# Patient Record
Sex: Female | Born: 1955
Health system: Southern US, Community
[De-identification: ages and names within clinical notes are randomized; demographics above are authoritative.]

## PROBLEM LIST (undated history)

## (undated) DIAGNOSIS — M869 Osteomyelitis, unspecified: Secondary | ICD-10-CM

## (undated) DIAGNOSIS — R011 Cardiac murmur, unspecified: Secondary | ICD-10-CM

## (undated) DIAGNOSIS — I2510 Atherosclerotic heart disease of native coronary artery without angina pectoris: Secondary | ICD-10-CM

## (undated) DIAGNOSIS — I10 Essential (primary) hypertension: Secondary | ICD-10-CM

## (undated) DIAGNOSIS — L97529 Non-pressure chronic ulcer of other part of left foot with unspecified severity: Secondary | ICD-10-CM

## (undated) DIAGNOSIS — N189 Chronic kidney disease, unspecified: Secondary | ICD-10-CM

## (undated) DIAGNOSIS — I639 Cerebral infarction, unspecified: Secondary | ICD-10-CM

## (undated) DIAGNOSIS — R0600 Dyspnea, unspecified: Secondary | ICD-10-CM

## (undated) DIAGNOSIS — K219 Gastro-esophageal reflux disease without esophagitis: Secondary | ICD-10-CM

## (undated) DIAGNOSIS — M1990 Unspecified osteoarthritis, unspecified site: Secondary | ICD-10-CM

## (undated) DIAGNOSIS — I739 Peripheral vascular disease, unspecified: Secondary | ICD-10-CM

## (undated) DIAGNOSIS — K7581 Nonalcoholic steatohepatitis (NASH): Secondary | ICD-10-CM

## (undated) DIAGNOSIS — G4730 Sleep apnea, unspecified: Secondary | ICD-10-CM

## (undated) DIAGNOSIS — K769 Liver disease, unspecified: Secondary | ICD-10-CM

## (undated) DIAGNOSIS — L97519 Non-pressure chronic ulcer of other part of right foot with unspecified severity: Secondary | ICD-10-CM

## (undated) DIAGNOSIS — G4733 Obstructive sleep apnea (adult) (pediatric): Secondary | ICD-10-CM

## (undated) DIAGNOSIS — T7840XA Allergy, unspecified, initial encounter: Secondary | ICD-10-CM

## (undated) DIAGNOSIS — E119 Type 2 diabetes mellitus without complications: Secondary | ICD-10-CM

## (undated) DIAGNOSIS — G8929 Other chronic pain: Secondary | ICD-10-CM

## (undated) HISTORY — DX: Essential (primary) hypertension: I10

## (undated) HISTORY — DX: Allergy, unspecified, initial encounter: T78.40XA

## (undated) HISTORY — PX: TUBAL LIGATION: SHX77

## (undated) HISTORY — PX: CHOLECYSTECTOMY: SHX55

## (undated) HISTORY — DX: Non-pressure chronic ulcer of other part of left foot with unspecified severity: L97.529

## (undated) HISTORY — PX: HERNIA REPAIR: SHX51

## (undated) HISTORY — PX: KNEE SURGERY: SHX244

## (undated) HISTORY — PX: ABDOMINAL HYSTERECTOMY: SHX81

## (undated) HISTORY — DX: Unspecified osteoarthritis, unspecified site: M19.90

## (undated) HISTORY — PX: TONSILLECTOMY: SUR1361

## (undated) HISTORY — PX: SPINE SURGERY: SHX786

## (undated) HISTORY — PX: TOE AMPUTATION: SHX809

## (undated) HISTORY — DX: Nonalcoholic steatohepatitis (NASH): K75.81

## (undated) HISTORY — PX: BACK SURGERY: SHX140

## (undated) HISTORY — DX: Gastro-esophageal reflux disease without esophagitis: K21.9

## (undated) HISTORY — PX: JOINT REPLACEMENT: SHX530

## (undated) HISTORY — DX: Non-pressure chronic ulcer of other part of right foot with unspecified severity: L97.519

---

## 2012-03-21 DIAGNOSIS — Z9989 Dependence on other enabling machines and devices: Secondary | ICD-10-CM

## 2012-03-21 DIAGNOSIS — G4733 Obstructive sleep apnea (adult) (pediatric): Secondary | ICD-10-CM

## 2012-07-27 DIAGNOSIS — M549 Dorsalgia, unspecified: Secondary | ICD-10-CM | POA: Diagnosis present

## 2012-07-27 DIAGNOSIS — G8929 Other chronic pain: Secondary | ICD-10-CM | POA: Diagnosis present

## 2015-06-06 DIAGNOSIS — I10 Essential (primary) hypertension: Secondary | ICD-10-CM | POA: Diagnosis present

## 2015-07-18 DIAGNOSIS — E119 Type 2 diabetes mellitus without complications: Secondary | ICD-10-CM

## 2015-12-17 DIAGNOSIS — N183 Chronic kidney disease, stage 3 unspecified: Secondary | ICD-10-CM | POA: Diagnosis present

## 2016-09-16 DIAGNOSIS — K7581 Nonalcoholic steatohepatitis (NASH): Secondary | ICD-10-CM

## 2016-09-16 HISTORY — DX: Nonalcoholic steatohepatitis (NASH): K75.81

## 2016-09-19 ENCOUNTER — Emergency Department (HOSPITAL_COMMUNITY): Payer: Medicare PPO

## 2016-09-19 ENCOUNTER — Encounter (HOSPITAL_COMMUNITY): Payer: Self-pay | Admitting: *Deleted

## 2016-09-19 ENCOUNTER — Observation Stay (HOSPITAL_COMMUNITY)
Admission: EM | Admit: 2016-09-19 | Discharge: 2016-09-23 | Disposition: A | Discharge status: Home / Self Care | Payer: Medicare PPO | Attending: Internal Medicine | Admitting: Internal Medicine

## 2016-09-19 DIAGNOSIS — I10 Essential (primary) hypertension: Secondary | ICD-10-CM | POA: Diagnosis present

## 2016-09-19 DIAGNOSIS — I129 Hypertensive chronic kidney disease with stage 1 through stage 4 chronic kidney disease, or unspecified chronic kidney disease: Secondary | ICD-10-CM | POA: Diagnosis not present

## 2016-09-19 DIAGNOSIS — Z87891 Personal history of nicotine dependence: Secondary | ICD-10-CM | POA: Diagnosis not present

## 2016-09-19 DIAGNOSIS — E119 Type 2 diabetes mellitus without complications: Secondary | ICD-10-CM

## 2016-09-19 DIAGNOSIS — Z794 Long term (current) use of insulin: Secondary | ICD-10-CM | POA: Diagnosis not present

## 2016-09-19 DIAGNOSIS — Z9989 Dependence on other enabling machines and devices: Secondary | ICD-10-CM

## 2016-09-19 DIAGNOSIS — Z90710 Acquired absence of both cervix and uterus: Secondary | ICD-10-CM | POA: Diagnosis not present

## 2016-09-19 DIAGNOSIS — R160 Hepatomegaly, not elsewhere classified: Secondary | ICD-10-CM | POA: Diagnosis present

## 2016-09-19 DIAGNOSIS — E1122 Type 2 diabetes mellitus with diabetic chronic kidney disease: Secondary | ICD-10-CM | POA: Diagnosis not present

## 2016-09-19 DIAGNOSIS — E871 Hypo-osmolality and hyponatremia: Secondary | ICD-10-CM | POA: Diagnosis not present

## 2016-09-19 DIAGNOSIS — R740 Nonspecific elevation of levels of transaminase and lactic acid dehydrogenase [LDH]: Secondary | ICD-10-CM | POA: Diagnosis present

## 2016-09-19 DIAGNOSIS — E1165 Type 2 diabetes mellitus with hyperglycemia: Secondary | ICD-10-CM | POA: Diagnosis not present

## 2016-09-19 DIAGNOSIS — D631 Anemia in chronic kidney disease: Secondary | ICD-10-CM | POA: Diagnosis not present

## 2016-09-19 DIAGNOSIS — N179 Acute kidney failure, unspecified: Secondary | ICD-10-CM | POA: Diagnosis not present

## 2016-09-19 DIAGNOSIS — Z9049 Acquired absence of other specified parts of digestive tract: Secondary | ICD-10-CM | POA: Diagnosis not present

## 2016-09-19 DIAGNOSIS — N183 Chronic kidney disease, stage 3 unspecified: Secondary | ICD-10-CM | POA: Diagnosis present

## 2016-09-19 DIAGNOSIS — K769 Liver disease, unspecified: Secondary | ICD-10-CM | POA: Insufficient documentation

## 2016-09-19 DIAGNOSIS — G4733 Obstructive sleep apnea (adult) (pediatric): Secondary | ICD-10-CM | POA: Diagnosis not present

## 2016-09-19 DIAGNOSIS — IMO0002 Reserved for concepts with insufficient information to code with codable children: Secondary | ICD-10-CM

## 2016-09-19 DIAGNOSIS — E1142 Type 2 diabetes mellitus with diabetic polyneuropathy: Secondary | ICD-10-CM | POA: Insufficient documentation

## 2016-09-19 DIAGNOSIS — Z79899 Other long term (current) drug therapy: Secondary | ICD-10-CM | POA: Insufficient documentation

## 2016-09-19 DIAGNOSIS — Z8673 Personal history of transient ischemic attack (TIA), and cerebral infarction without residual deficits: Secondary | ICD-10-CM | POA: Diagnosis not present

## 2016-09-19 DIAGNOSIS — R1011 Right upper quadrant pain: Secondary | ICD-10-CM | POA: Diagnosis present

## 2016-09-19 DIAGNOSIS — M549 Dorsalgia, unspecified: Secondary | ICD-10-CM

## 2016-09-19 DIAGNOSIS — R162 Hepatomegaly with splenomegaly, not elsewhere classified: Secondary | ICD-10-CM | POA: Diagnosis not present

## 2016-09-19 DIAGNOSIS — Z881 Allergy status to other antibiotic agents status: Secondary | ICD-10-CM | POA: Diagnosis not present

## 2016-09-19 DIAGNOSIS — E1140 Type 2 diabetes mellitus with diabetic neuropathy, unspecified: Secondary | ICD-10-CM

## 2016-09-19 DIAGNOSIS — G8929 Other chronic pain: Secondary | ICD-10-CM | POA: Diagnosis present

## 2016-09-19 DIAGNOSIS — R7401 Elevation of levels of liver transaminase levels: Secondary | ICD-10-CM

## 2016-09-19 HISTORY — DX: Sleep apnea, unspecified: G47.30

## 2016-09-19 HISTORY — DX: Type 2 diabetes mellitus without complications: E11.9

## 2016-09-19 HISTORY — DX: Obstructive sleep apnea (adult) (pediatric): G47.33

## 2016-09-19 HISTORY — DX: Atherosclerotic heart disease of native coronary artery without angina pectoris: I25.10

## 2016-09-19 HISTORY — DX: Other chronic pain: G89.29

## 2016-09-19 HISTORY — DX: Dyspnea, unspecified: R06.00

## 2016-09-19 HISTORY — DX: Peripheral vascular disease, unspecified: I73.9

## 2016-09-19 HISTORY — DX: Osteomyelitis, unspecified: M86.9

## 2016-09-19 HISTORY — DX: Cardiac murmur, unspecified: R01.1

## 2016-09-19 HISTORY — DX: Cerebral infarction, unspecified: I63.9

## 2016-09-19 HISTORY — DX: Chronic kidney disease, unspecified: N18.9

## 2016-09-19 LAB — I-STAT VENOUS BLOOD GAS, ED
Acid-base deficit: 3 mmol/L — ABNORMAL HIGH (ref 0.0–2.0)
Bicarbonate: 22.8 mmol/L (ref 20.0–28.0)
O2 Saturation: 54 %
PCO2 VEN: 41.6 mmHg — AB (ref 44.0–60.0)
TCO2: 24 mmol/L (ref 0–100)
pH, Ven: 7.347 (ref 7.250–7.430)
pO2, Ven: 30 mmHg — CL (ref 32.0–45.0)

## 2016-09-19 LAB — URINALYSIS, ROUTINE W REFLEX MICROSCOPIC
Bacteria, UA: NONE SEEN
Bilirubin Urine: NEGATIVE
HGB URINE DIPSTICK: NEGATIVE
Ketones, ur: NEGATIVE mg/dL
LEUKOCYTES UA: NEGATIVE
NITRITE: NEGATIVE
PH: 5 (ref 5.0–8.0)
Protein, ur: 30 mg/dL — AB
SPECIFIC GRAVITY, URINE: 1.021 (ref 1.005–1.030)

## 2016-09-19 LAB — COMPREHENSIVE METABOLIC PANEL
ALT: 317 U/L — AB (ref 14–54)
AST: 153 U/L — AB (ref 15–41)
Albumin: 3.2 g/dL — ABNORMAL LOW (ref 3.5–5.0)
Alkaline Phosphatase: 166 U/L — ABNORMAL HIGH (ref 38–126)
Anion gap: 9 (ref 5–15)
BILIRUBIN TOTAL: 0.7 mg/dL (ref 0.3–1.2)
BUN: 30 mg/dL — AB (ref 6–20)
CALCIUM: 8.8 mg/dL — AB (ref 8.9–10.3)
CO2: 22 mmol/L (ref 22–32)
CREATININE: 1.83 mg/dL — AB (ref 0.44–1.00)
Chloride: 98 mmol/L — ABNORMAL LOW (ref 101–111)
GFR, EST AFRICAN AMERICAN: 33 mL/min — AB (ref >60)
GFR, EST NON AFRICAN AMERICAN: 29 mL/min — AB (ref >60)
Glucose, Bld: 571 mg/dL (ref 65–99)
Potassium: 4.3 mmol/L (ref 3.5–5.1)
Sodium: 129 mmol/L — ABNORMAL LOW (ref 135–145)
TOTAL PROTEIN: 6.4 g/dL — AB (ref 6.5–8.1)

## 2016-09-19 LAB — LIPASE, BLOOD: Lipase: 47 U/L (ref 11–51)

## 2016-09-19 LAB — CBC
HCT: 35.9 % — ABNORMAL LOW (ref 36.0–46.0)
Hemoglobin: 11.8 g/dL — ABNORMAL LOW (ref 12.0–15.0)
MCH: 28.5 pg (ref 26.0–34.0)
MCHC: 32.9 g/dL (ref 30.0–36.0)
MCV: 86.7 fL (ref 78.0–100.0)
PLATELETS: 147 10*3/uL — AB (ref 150–400)
RBC: 4.14 MIL/uL (ref 3.87–5.11)
RDW: 13.8 % (ref 11.5–15.5)
WBC: 4.1 10*3/uL (ref 4.0–10.5)

## 2016-09-19 LAB — CBG MONITORING, ED: GLUCOSE-CAPILLARY: 535 mg/dL — AB (ref 65–99)

## 2016-09-19 MED ORDER — SODIUM CHLORIDE 0.9 % IV BOLUS (SEPSIS)
1000.0000 mL | Freq: Once | INTRAVENOUS | Status: AC
  Administered 2016-09-19: 1000 mL via INTRAVENOUS

## 2016-09-19 MED ORDER — INSULIN ASPART 100 UNIT/ML ~~LOC~~ SOLN
10.0000 [IU] | Freq: Once | SUBCUTANEOUS | Status: AC
  Administered 2016-09-19: 10 [IU] via INTRAVENOUS
  Filled 2016-09-19: qty 1

## 2016-09-19 NOTE — ED Notes (Signed)
Patient's family member came up to nurse's station r/e wait and her feeling like her blood sugar is low. Will obtain CBG and reassess. Patient sitting up in wheelchair, NAD noted at this time. Resp e/u, skin is warm and dry. Apologized for wait times and provided blanket. Pt verbalized understanding and is being patient.

## 2016-09-19 NOTE — ED Provider Notes (Signed)
[place] DEPT Provider Note   CSN: [id number] Arrival date & time: 09/19/16  1738     History   Chief Complaint Chief Complaint  Patient presents with  . Abdominal Pain    HPI [name] is a 61 y.o. female who presents with generalized weakness. 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 Her daughter is at bedside. The patient lives in [place] but has been visiting her daughter. Yesterday they went to [facility] because the patient was lightheaded and had several episodes of post-tussive emesis. She denies feeling nauseous or having abdominal pain at that time. She was evaluated and noted to have elevated LFTs but did not want any further evaluation and left AMA. She denies use of Tylenol, alcohol, or any drug use. Her daughter describes that she has had a slow decline over the past several years. She has a nurse aide who comes to her home a couple times a week and she has been requiring more assistance. She states she has been out of her Lantus and has not taken her insulin today because she forgot. She has neuropathy symptoms which are worsening. She reports not having a BM in 3 days which is unusual since she usually has diarrhea. She feels lightheaded and generally unwell. No fever, chest pain, SOB, current nausea, vomiting, diarrhea, dysuria.  HPI  Past Medical History:  Diagnosis Date  . Diabetes mellitus without complication ([place])   . Stroke [facility])     There are no active problems to display for this patient.   History reviewed. No pertinent surgical history.  OB History    No data available       Home Medications    Prior to Admission medications   Not on File    Family History No family history on file.  Social History Social History  Substance Use Topics  . Smoking status: Never Smoker  . Smokeless tobacco: Never Used  . Alcohol use No       Allergies   Doxycycline; Inderal [propranolol]; Lasix [furosemide]; Norvasc [amlodipine besylate]; Prednisone; Sulfa antibiotics; and Vancomycin   Review of Systems Review of Systems  Constitutional: Negative for chills and fever.  Respiratory: Positive for cough. Negative for shortness of breath.   Cardiovascular: Negative for chest pain.  Gastrointestinal: Positive for abdominal pain, constipation and vomiting (resolved). Negative for diarrhea and nausea.  Neurological: Positive for weakness (generalized) and light-headedness. Negative for syncope.  All other systems reviewed and are negative.    Physical Exam Updated Vital Signs BP 128/74 (BP Location: Left Arm)   Pulse 67   Temp 98.7 F (37.1 C) (Oral)   Resp 16   Ht 5' 6"  (1.676 m)   Wt 113.4 kg (250 lb)   SpO2 96%   BMI 40.35 kg/m   Physical Exam  Constitutional: She is oriented to person, place, and time. She appears well-developed and well-nourished. No distress.  Obese, appears older than stated age, calm, cooperative  HENT:  Head: Normocephalic and atraumatic.  Eyes: Pupils are equal, round, and reactive to light. Conjunctivae are normal. Right eye exhibits no discharge. Left eye exhibits no discharge. No scleral icterus.  Neck: Normal range of motion.  Cardiovascular: Normal rate and regular rhythm.  Exam reveals no gallop and no friction rub.   No murmur heard. Pulmonary/Chest: Effort normal and breath sounds normal. No respiratory distress. She has no wheezes. She has no rales.  She exhibits no tenderness.  Abdominal: Soft. Bowel sounds are normal. She exhibits no distension and no mass. There is tenderness (RUQ tenderness). There is no rebound and no guarding. No hernia.  Cholecystectomy scar  Neurological: She is alert and oriented to person, place, and time.  Skin: Skin is warm and dry.  Psychiatric: She has a normal mood and affect. Her behavior is normal.  Nursing note and vitals reviewed.    ED  Treatments / Results  Labs (all labs ordered are listed, but only abnormal results are displayed) Labs Reviewed  COMPREHENSIVE METABOLIC PANEL - Abnormal; Notable for the following:       Result Value   Sodium 129 (*)    Chloride 98 (*)    Glucose, Bld 571 (*)    BUN 30 (*)    Creatinine, Ser 1.83 (*)    Calcium 8.8 (*)    Total Protein 6.4 (*)    Albumin 3.2 (*)    AST 153 (*)    ALT 317 (*)    Alkaline Phosphatase 166 (*)    GFR calc non Af Amer 29 (*)    GFR calc Af Amer 33 (*)    All other components within normal limits  CBC - Abnormal; Notable for the following:    Hemoglobin 11.8 (*)    HCT 35.9 (*)    Platelets 147 (*)    All other components within normal limits  URINALYSIS, ROUTINE W REFLEX MICROSCOPIC - Abnormal; Notable for the following:    Glucose, UA >=500 (*)    Protein, ur 30 (*)    Squamous Epithelial / LPF 0-5 (*)    All other components within normal limits  CBG MONITORING, ED - Abnormal; Notable for the following:    Glucose-Capillary 535 (*)    All other components within normal limits  I-STAT VENOUS BLOOD GAS, ED - Abnormal; Notable for the following:    pCO2, Ven 41.6 (*)    pO2, Ven 30.0 (*)    Acid-base deficit 3.0 (*)    All other components within normal limits  LIPASE, BLOOD  BLOOD GAS, VENOUS  HEPATITIS PANEL, ACUTE  CBG MONITORING, ED    EKG  EKG Interpretation None       Radiology Ct Abdomen Pelvis Wo Contrast  Result Date: 09/20/2016 CLINICAL DATA:  RIGHT upper quadrant pain beginning yesterday. EXAM: CT ABDOMEN AND PELVIS WITHOUT CONTRAST TECHNIQUE: Multidetector CT imaging of the abdomen and pelvis was performed following the standard protocol without IV contrast. GFR 29. COMPARISON:  None. FINDINGS: LOWER CHEST: Lung bases are clear. The visualized heart size is normal. No pericardial effusion. HEPATOBILIARY: Irregular hypodensities in the periphery of the liver, most conspicuous within the LEFT lobe (23 Hounsfield units). Mild  hepatomegaly. No hepatic calcifications. Status post cholecystectomy. PANCREAS: Normal. SPLEEN: Normal. ADRENALS/URINARY TRACT: Kidneys are orthotopic, demonstrating normal size and morphology. No nephrolithiasis, hydronephrosis; limited assessment for renal masses on this nonenhanced examination. The unopacified ureters are normal in course and caliber. Urinary bladder is partially distended and unremarkable. Normal adrenal glands. STOMACH/BOWEL: The stomach, small and large bowel are normal in course and caliber without inflammatory changes, sensitivity decreased by lack of enteric contrast. Normal appendix. VASCULAR/LYMPHATIC: Aortoiliac vessels are normal in course and caliber. Normal mild calcific atherosclerosis. 17 mm round portal caval lymph node. Additional smaller porta hepatis lymph nodes. REPRODUCTIVE: Status post hysterectomy. OTHER: No intraperitoneal free fluid or free air. MUSCULOSKELETAL: Non-acute. Anterior abdominal wall ligamentous laxity. Spinal stimulator battery pack RIGHT gluteal soft  tissues, leading at T8. Anterior abdominal wall scarring. Moderate sacroiliac osteoarthrosis. 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 IMPRESSION: 1. Irregular peripheral hepatic masses. Mild perihepatic lymphadenopathy. Differential diagnosis includes infarcts, infection, atypical hemangiomas and though less likely, metastasis. Given renal failure and, spinal stimulator, recommend ultrasound. 2. Status post cholecystectomy.  Normal appendix. Aortic Atherosclerosis (ICD10-I70.0). Electronically Signed   By: Elon Alas M.D.   On: 09/20/2016 00:43    Procedures Procedures (including critical care time)  Medications Ordered in ED Medications  sodium chloride 0.9 % bolus 1,000 mL (0 mLs Intravenous Stopped 09/20/16 0032)  sodium chloride 0.9 % bolus 1,000 mL (0 mLs Intravenous Stopped 09/20/16 0032)  insulin aspart (novoLOG) injection 10 Units (10 Units  Intravenous Given 09/19/16 2314)  oxyCODONE (Oxy IR/ROXICODONE) immediate release tablet 10 mg (10 mg Oral Given 09/20/16 0107)     Initial Impression / Assessment and Plan / ED Course  I have reviewed the triage vital signs and the nursing notes.  Pertinent labs & imaging results that were available during my care of the patient were reviewed by me and considered in my medical decision making (see chart for details).  61 year old female presents with worsening acute on chronic generalized weakness and RUQ abdominal pain. BP is soft but otherwise vitals are normal. She is chronically ill appearing but pleasant and conversant. She does have RUQ tenderness. CBC remarkable for mild anemia. CMP remarkable for hyponatremia (129), hypochloremia (98), marked hyperglycemia (571), elevated BUN/SCr (which are at baseline), and persistently elevated LFTs. Compared to yesterday in Care Everywhere LFTs are about the same. It appears in March of this year her LFTs are normal. Will order CT scan to further evaluate since she doesn't have a gallbladder. Hepatitis panel sent. It does not appear she's in DKA but is dehydrated so will give fluids and 10 units of insulin.  CT shows multiple irregular hepatic masses with perihepatic lymphadenopathy. Differential diagnosis includes infarcts, infection, atypical hemangiomas and though less likely, metastasis. I spoke with Dr. Collene Mares who recommends MRI of the liver as an inpatient due to chronic illness and poor outpatient f/u. Case signed out to [facility] PA-C who will consult hosptalist   Final Clinical Impressions(s) / ED Diagnoses   Final diagnoses:  RUQ pain  Liver masses  Elevated transaminase level  Uncontrolled type 2 diabetes mellitus with diabetic neuropathy, with long-term current use of insulin [facility])    New Prescriptions New Prescriptions   No medications on file     Iris Pert 09/20/16 1512    Pattricia Boss, MD 09/20/16 2315

## 2016-09-19 NOTE — ED Notes (Signed)
Notified nurse first result from istat venous blood gas.

## 2016-09-19 NOTE — ED Notes (Signed)
MD made aware of patient's venous blood gas. No new orders at this time.

## 2016-09-19 NOTE — ED Triage Notes (Signed)
The pt was seen at [facility] yesterday she was  Diagnosed with a fatty liver.  They wanted to keep her yesterday  She lives in [place].

## 2016-09-19 NOTE — ED Provider Notes (Signed)
Received critical value of glucose >500. Reviewed labs and vitals and no e/o DKA, will add on VBG and urinalysis, but does not seem to be in critical need of a bed at this time based on labs, CC and vitals.    Merrily Pew, MD 09/19/16 [phone]

## 2016-09-20 ENCOUNTER — Observation Stay (HOSPITAL_COMMUNITY): Payer: Medicare PPO

## 2016-09-20 ENCOUNTER — Encounter (HOSPITAL_COMMUNITY): Payer: Self-pay | Admitting: Family Medicine

## 2016-09-20 ENCOUNTER — Emergency Department (HOSPITAL_COMMUNITY): Payer: Medicare PPO

## 2016-09-20 DIAGNOSIS — R160 Hepatomegaly, not elsewhere classified: Secondary | ICD-10-CM | POA: Diagnosis not present

## 2016-09-20 DIAGNOSIS — R162 Hepatomegaly with splenomegaly, not elsewhere classified: Secondary | ICD-10-CM | POA: Diagnosis not present

## 2016-09-20 DIAGNOSIS — G4733 Obstructive sleep apnea (adult) (pediatric): Secondary | ICD-10-CM | POA: Diagnosis not present

## 2016-09-20 DIAGNOSIS — R1011 Right upper quadrant pain: Secondary | ICD-10-CM

## 2016-09-20 DIAGNOSIS — Z9989 Dependence on other enabling machines and devices: Secondary | ICD-10-CM

## 2016-09-20 DIAGNOSIS — N183 Chronic kidney disease, stage 3 (moderate): Secondary | ICD-10-CM | POA: Diagnosis not present

## 2016-09-20 DIAGNOSIS — Z794 Long term (current) use of insulin: Secondary | ICD-10-CM | POA: Diagnosis not present

## 2016-09-20 DIAGNOSIS — E1140 Type 2 diabetes mellitus with diabetic neuropathy, unspecified: Secondary | ICD-10-CM | POA: Diagnosis not present

## 2016-09-20 DIAGNOSIS — I10 Essential (primary) hypertension: Secondary | ICD-10-CM | POA: Diagnosis not present

## 2016-09-20 DIAGNOSIS — M549 Dorsalgia, unspecified: Secondary | ICD-10-CM | POA: Diagnosis not present

## 2016-09-20 DIAGNOSIS — E1165 Type 2 diabetes mellitus with hyperglycemia: Secondary | ICD-10-CM | POA: Diagnosis not present

## 2016-09-20 DIAGNOSIS — R740 Nonspecific elevation of levels of transaminase and lactic acid dehydrogenase [LDH]: Secondary | ICD-10-CM | POA: Diagnosis not present

## 2016-09-20 DIAGNOSIS — G8929 Other chronic pain: Secondary | ICD-10-CM | POA: Diagnosis not present

## 2016-09-20 LAB — CBG MONITORING, ED
GLUCOSE-CAPILLARY: 359 mg/dL — AB (ref 65–99)
GLUCOSE-CAPILLARY: 411 mg/dL — AB (ref 65–99)
GLUCOSE-CAPILLARY: 317 mg/dL — AB (ref 65–99)
GLUCOSE-CAPILLARY: 318 mg/dL — AB (ref 65–99)
Glucose-Capillary: 290 mg/dL — ABNORMAL HIGH (ref 65–99)
Glucose-Capillary: 335 mg/dL — ABNORMAL HIGH (ref 65–99)

## 2016-09-20 LAB — GLUCOSE, CAPILLARY: GLUCOSE-CAPILLARY: 378 mg/dL — AB (ref 65–99)

## 2016-09-20 MED ORDER — PRAVASTATIN SODIUM 20 MG PO TABS
10.0000 mg | ORAL_TABLET | Freq: Every day | ORAL | Status: DC
  Administered 2016-09-20: 10 mg via ORAL
  Filled 2016-09-20 (×2): qty 1

## 2016-09-20 MED ORDER — INSULIN GLARGINE 100 UNIT/ML ~~LOC~~ SOLN
40.0000 [IU] | Freq: Every day | SUBCUTANEOUS | Status: DC
  Administered 2016-09-20 – 2016-09-21 (×2): 40 [IU] via SUBCUTANEOUS
  Filled 2016-09-20 (×2): qty 0.4

## 2016-09-20 MED ORDER — INSULIN ASPART 100 UNIT/ML ~~LOC~~ SOLN
20.0000 [IU] | Freq: Once | SUBCUTANEOUS | Status: DC
  Filled 2016-09-20: qty 1

## 2016-09-20 MED ORDER — OXYCODONE HCL 5 MG PO TABS
10.0000 mg | ORAL_TABLET | Freq: Once | ORAL | Status: AC
  Administered 2016-09-20: 10 mg via ORAL
  Filled 2016-09-20: qty 2

## 2016-09-20 MED ORDER — CARVEDILOL 25 MG PO TABS
25.0000 mg | ORAL_TABLET | Freq: Two times a day (BID) | ORAL | Status: DC
  Administered 2016-09-20 – 2016-09-23 (×7): 25 mg via ORAL
  Filled 2016-09-20 (×5): qty 1
  Filled 2016-09-20: qty 2
  Filled 2016-09-20: qty 1

## 2016-09-20 MED ORDER — ENOXAPARIN SODIUM 40 MG/0.4ML ~~LOC~~ SOLN
40.0000 mg | SUBCUTANEOUS | Status: DC
  Administered 2016-09-20 – 2016-09-23 (×4): 40 mg via SUBCUTANEOUS
  Filled 2016-09-20 (×5): qty 0.4

## 2016-09-20 MED ORDER — HYDROCHLOROTHIAZIDE 25 MG PO TABS
25.0000 mg | ORAL_TABLET | Freq: Every day | ORAL | Status: DC
  Administered 2016-09-20 – 2016-09-23 (×4): 25 mg via ORAL
  Filled 2016-09-20 (×4): qty 1

## 2016-09-20 MED ORDER — SERTRALINE HCL 50 MG PO TABS
50.0000 mg | ORAL_TABLET | Freq: Every day | ORAL | Status: DC
  Filled 2016-09-20: qty 1

## 2016-09-20 MED ORDER — INSULIN GLARGINE 100 UNIT/ML ~~LOC~~ SOLN
40.0000 [IU] | Freq: Every day | SUBCUTANEOUS | Status: DC
  Filled 2016-09-20: qty 0.4

## 2016-09-20 MED ORDER — ONDANSETRON HCL 4 MG/2ML IJ SOLN
4.0000 mg | Freq: Four times a day (QID) | INTRAMUSCULAR | Status: DC | PRN
  Filled 2016-09-20: qty 2

## 2016-09-20 MED ORDER — INSULIN ASPART 100 UNIT/ML ~~LOC~~ SOLN
20.0000 [IU] | Freq: Once | SUBCUTANEOUS | Status: AC
  Administered 2016-09-20: 20 [IU] via SUBCUTANEOUS

## 2016-09-20 MED ORDER — TAPENTADOL HCL 50 MG PO TABS
50.0000 mg | ORAL_TABLET | Freq: Three times a day (TID) | ORAL | Status: DC
  Administered 2016-09-20 – 2016-09-21 (×4): 50 mg via ORAL
  Filled 2016-09-20 (×8): qty 1

## 2016-09-20 MED ORDER — INSULIN ASPART 100 UNIT/ML ~~LOC~~ SOLN
0.0000 [IU] | Freq: Three times a day (TID) | SUBCUTANEOUS | Status: DC
  Administered 2016-09-20: 15 [IU] via SUBCUTANEOUS
  Administered 2016-09-20: 20 [IU] via SUBCUTANEOUS
  Administered 2016-09-20: 15 [IU] via SUBCUTANEOUS
  Administered 2016-09-21 (×2): 20 [IU] via SUBCUTANEOUS
  Administered 2016-09-21: 10 [IU] via SUBCUTANEOUS
  Administered 2016-09-22 (×2): 15 [IU] via SUBCUTANEOUS
  Administered 2016-09-22: 20 [IU] via SUBCUTANEOUS
  Administered 2016-09-23 (×2): 11 [IU] via SUBCUTANEOUS
  Filled 2016-09-20 (×3): qty 1

## 2016-09-20 MED ORDER — ONDANSETRON HCL 4 MG PO TABS
4.0000 mg | ORAL_TABLET | Freq: Four times a day (QID) | ORAL | Status: DC | PRN
  Administered 2016-09-22 – 2016-09-23 (×2): 4 mg via ORAL
  Filled 2016-09-20 (×2): qty 1

## 2016-09-20 MED ORDER — BUSPIRONE HCL 15 MG PO TABS
15.0000 mg | ORAL_TABLET | Freq: Three times a day (TID) | ORAL | Status: DC
  Administered 2016-09-20 – 2016-09-23 (×10): 15 mg via ORAL
  Filled 2016-09-20 (×4): qty 1
  Filled 2016-09-20: qty 2
  Filled 2016-09-20: qty 1
  Filled 2016-09-20: qty 2
  Filled 2016-09-20 (×3): qty 1

## 2016-09-20 MED ORDER — INSULIN ASPART 100 UNIT/ML ~~LOC~~ SOLN: 6.0000 [IU] | Freq: Three times a day (TID) | SUBCUTANEOUS | Status: DC

## 2016-09-20 MED ORDER — INSULIN ASPART 100 UNIT/ML ~~LOC~~ SOLN
0.0000 [IU] | Freq: Every day | SUBCUTANEOUS | Status: DC
Start: 2016-09-20 — End: 2016-09-23
  Administered 2016-09-21: 2 [IU] via SUBCUTANEOUS
  Administered 2016-09-21: 5 [IU] via SUBCUTANEOUS
  Administered 2016-09-22: 4 [IU] via SUBCUTANEOUS

## 2016-09-20 MED ORDER — SERTRALINE HCL 50 MG PO TABS
50.0000 mg | ORAL_TABLET | Freq: Every day | ORAL | Status: DC
  Filled 2016-09-20 (×2): qty 1

## 2016-09-20 MED ORDER — SERTRALINE HCL 50 MG PO TABS
50.0000 mg | ORAL_TABLET | Freq: Every day | ORAL | Status: DC
  Administered 2016-09-20 – 2016-09-22 (×3): 50 mg via ORAL
  Filled 2016-09-20 (×4): qty 1

## 2016-09-20 MED ORDER — GABAPENTIN 300 MG PO CAPS
300.0000 mg | ORAL_CAPSULE | Freq: Three times a day (TID) | ORAL | Status: DC
  Administered 2016-09-20 – 2016-09-23 (×10): 300 mg via ORAL
  Filled 2016-09-20 (×10): qty 1

## 2016-09-20 NOTE — H&P (Signed)
History and Physical  Patient Name: [name]     IWL:[id number]    DOB: [birth year]    DOA: 09/19/2016 PCP: Wyatt Mage, MD  Patient coming from: Daughter's home  Chief Complaint: Vomiting      HPI: Leean Whobrey is a 61 y.o. 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  The patient was in her normal health until a few weeks ago when she started to notice progressive fatigue.  A week or so ago, her daughter invited her down to visit to [facility], and when she got here daughter thought she "wasn't herself".  Then in the last few days, she was vomiting non-bilious, nonbloody emesis and so the daughter took her mother to the hospital in [place], where she had new elevated LFTs but was discharged.  It appears from notes that the patient was emphatic about leaving.  Today, she continued to feel tired and vomited, so her daughter brought her back to the ER here in [place] where the daughter lives.  There has been no fever, chills, night sweats.  There have been no rashes.  There has been NO abdominal pain, except with examination in the ER last night and tonight.  There has been no weight loss.  ED course: -Afebrile, heart rate 69, respirations and pulse ox normal, BP 110/80 -Na 129 corrects to 137 with glucose, K 4.3, Cr 1.83 (baseline 1.5-1.6), WBC 4.1K, Hgb 11.8 -Lipase normal -UA without pyuria or hematuria -CT abdomen showed peripheral hepatic "masses" with differential to include infarcts, infection, less likely metastasis -The case was discussed with GI who recommended MRI and hostpilist admision and TRH were asked to evaluate   Of note, the patient had "an ovarian mass that metastasized all over my lower abdomen" 4 years ago at [facility].  Notes from her [facility] G[facility] Onc Dr.  Claiborne Billings suggest that this was a benign serous cystadenoma only and make no mention of "metastasis" and in particular the op note from 04/12/12 states "There was no evidence of disease beyond the uterus. All visualized peritoneal, bowel and omental surfaces were normal"     ROS: Review of Systems  Constitutional: Positive for malaise/fatigue. Negative for chills, fever and weight loss.  Gastrointestinal: Positive for diarrhea, nausea and vomiting. Negative for abdominal pain, blood in stool, constipation and melena.  Musculoskeletal: Positive for back pain (chronic).  All other systems reviewed and are negative.         Past Medical History:  Diagnosis Date  . Diabetes mellitus without complication ([place])   . Stroke [facility])     History reviewed. No pertinent surgical history.  Social History: Patient lives alone in [place], [place].  The patient walks unassisted.  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  Former smoker.  2 daughters and a son.  Allergies  Allergen Reactions  . Vancomycin Other (See Comments)    Total organ shut down  . Doxycycline Itching  . Inderal [Propranolol] Cough  . Lasix [Furosemide] Other (See Comments)    Dehydration leading to kidney failure  . Norvasc [Amlodipine Besylate] Other (See Comments)    Severe edema  . Prednisone Other (See Comments)    ketoacidosis  . Sulfa Antibiotics Nausea Only    Family history: family history includes Cancer in her mother;  Epilepsy in her sister; Multiple sclerosis in her sister; Stroke in her father.  Prior to Admission medications   Medication Sig Start Date End Date Taking? Authorizing Provider  acetaminophen (TYLENOL) 500 MG tablet Take 1,000 mg by mouth as needed for pain.   Yes [provider]  busPIRone (BUSPAR) 15 MG tablet Take 15 mg by mouth 3 (three) times daily. 08/31/16  Yes [provider]  carvedilol (COREG) 25 MG tablet Take 25 mg by mouth 2  (two) times daily. 08/17/16  Yes [provider]  exenatide (BYETTA 5 MCG PEN) 5 MCG/0.02ML SOPN injection Inject 0.02 mLs into the skin 2 (two) times daily. 08/10/16  Yes [provider]  gabapentin (NEURONTIN) 100 MG capsule Take 300 mg by mouth 3 (three) times daily.   Yes [provider]  hydrochlorothiazide (HYDRODIURIL) 25 MG tablet Take 25 mg by mouth daily.   Yes [provider]  insulin aspart (NOVOLOG) 100 UNIT/ML FlexPen Inject 2-10 Units into the skin See admin instructions. Based on sliding scale. Max 60 units a day. 06/06/15  Yes [provider]  insulin glargine (LANTUS) 100 UNIT/ML injection Inject 60 Units into the skin 2 (two) times daily. 04/08/15  Yes [provider]  lisinopril (PRINIVIL,ZESTRIL) 40 MG tablet Take 40 mg by mouth 2 (two) times daily. 06/09/16  Yes [provider]  lovastatin (MEVACOR) 40 MG tablet Take 40 mg by mouth at bedtime.   Yes [provider]  sertraline (ZOLOFT) 50 MG tablet Take 50 mg by mouth daily. 07/02/16  Yes [provider]  tapentadol (NUCYNTA) 50 MG tablet Take 50 mg by mouth 3 (three) times daily.   Yes [provider]       Physical Exam: BP 123/66   Pulse 72   Temp 98.7 F (37.1 C) (Oral)   Resp 19   Ht 5' 6"  (1.676 m)   Wt 113.4 kg (250 lb)   SpO2 97%   BMI 40.35 kg/m  General appearance: Well-developed, obese adult female, alert and in no acute distress.   Eyes: Anicteric, conjunctiva pink, lids and lashes normal. PERRL.    ENT: No nasal deformity, discharge, epistaxis.  Hearing normal. OP moist without lesions.  Edentulous. Neck: No neck masses.  Trachea midline.  No thyromegaly/tenderness. Lymph: No cervical or supraclavicular lymphadenopathy. Skin: Warm and dry.  No jaundice.  No suspicious rashes or lesions. Cardiac: RRR, nl Z7-Q7, soft systolic murmur.  Capillary refill is brisk.  JVP not visible.  No LE edema.  Radial and DP pulses 2+ and  symmetric. Respiratory: Normal respiratory rate and rhythm.  CTAB without rales or wheezes. Abdomen: Abdomen soft.  Mild RUQ TTP, no guarding. There appears to be ascites, although patient states this is her normal.   MSK: No deformities or effusions.  No cyanosis or clubbing. Neuro: Cranial nerves normal.  Sensation intact to light touch. Speech is fluent.  Muscle strength normal.    Psych: Sensorium intact and responding to questions, attention normal.  Behavior appropriate.  Affect normal.  Judgment and insight appear normal.     Labs on Admission:  I have personally reviewed following labs and imaging studies: CBC:  Recent Labs Lab 09/19/16 1816  WBC 4.1  HGB 11.8*  HCT 35.9*  MCV 86.7  PLT 341*   Basic Metabolic Panel:  Recent Labs Lab 09/19/16 1816  NA 129*  K 4.3  CL 98*  CO2 22  GLUCOSE 571*  BUN 30*  CREATININE 1.83*  CALCIUM 8.8*  GFR: Estimated Creatinine Clearance: 41.2 mL/min (A) (by C-G formula based on SCr of 1.83 mg/dL (H)).  Liver Function Tests:  Recent Labs Lab 09/19/16 1816  AST 153*  ALT 317*  ALKPHOS 166*  BILITOT 0.7  PROT 6.4*  ALBUMIN 3.2*    Recent Labs Lab 09/19/16 1816  LIPASE 47   CBG:  Recent Labs Lab 09/19/16 2015  GLUCAP 535*             Radiological Exams on Admission: Personally reviewed CT report: Ct Abdomen Pelvis Wo Contrast  Result Date: 09/20/2016 CLINICAL DATA:  RIGHT upper quadrant pain beginning yesterday. EXAM: CT ABDOMEN AND PELVIS WITHOUT CONTRAST TECHNIQUE: Multidetector CT imaging of the abdomen and pelvis was performed following the standard protocol without IV contrast. GFR 29. COMPARISON:  None. FINDINGS: LOWER CHEST: Lung bases are clear. The visualized heart size is normal. No pericardial effusion. HEPATOBILIARY: Irregular hypodensities in the periphery of the liver, most conspicuous within the LEFT lobe (23 Hounsfield units). Mild hepatomegaly. No hepatic calcifications. Status post  cholecystectomy. PANCREAS: Normal. SPLEEN: Normal. ADRENALS/URINARY TRACT: Kidneys are orthotopic, demonstrating normal size and morphology. No nephrolithiasis, hydronephrosis; limited assessment for renal masses on this nonenhanced examination. The unopacified ureters are normal in course and caliber. Urinary bladder is partially distended and unremarkable. Normal adrenal glands. STOMACH/BOWEL: The stomach, small and large bowel are normal in course and caliber without inflammatory changes, sensitivity decreased by lack of enteric contrast. Normal appendix. VASCULAR/LYMPHATIC: Aortoiliac vessels are normal in course and caliber. Normal mild calcific atherosclerosis. 17 mm round portal caval lymph node. Additional smaller porta hepatis lymph nodes. REPRODUCTIVE: Status post hysterectomy. OTHER: No intraperitoneal free fluid or free air. MUSCULOSKELETAL: Non-acute. Anterior abdominal wall ligamentous laxity. Spinal stimulator battery pack RIGHT gluteal soft tissues, leading at T8. Anterior abdominal wall scarring. Moderate sacroiliac osteoarthrosis. 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 IMPRESSION: 1. Irregular peripheral hepatic masses. Mild perihepatic lymphadenopathy. Differential diagnosis includes infarcts, infection, atypical hemangiomas and though less likely, metastasis. Given renal failure and, spinal stimulator, recommend ultrasound. 2. Status post cholecystectomy.  Normal appendix. Aortic Atherosclerosis (ICD10-I70.0). Electronically Signed   By: Elon Alas M.D.   On: 09/20/2016 00:43        Assessment/Plan  1. Liver masses and transaminitis:  Differential includes infarcts, infection, atypical hemagiomas, metastasis.  Need to rule out emboli.   -Obtain Abdomen US -Obtain blood cultures -Obtain Echo -Consult GI, appreciate cares -Contact [employer] rep re: MRI-compatibility of stimulator -Follow hepatitis serologies   2. Chronic  pain:  -Continue Nucynta and gabapentin -Continue sertraline, Buspar  3. Diabetes with hyperglycemia:  -Glargine 50 units BID -Mealtime insulin and SSI  -Hold Byetta  4. Hypertension:  Soft BP at arrival. -Hold lisinopril -Continue carvedilol, HCTZ, statin  5. OSA:  -Continue CPAP    DVT prophylaxis: Lovenox  Code Status: FULL  Family Communication: Daughter present  Disposition Plan: Anticipate further work up and disposition per GI Consults called: GI, Dr. Collene Mares Admission status: OBS At the point of initial evaluation, it is my clinical opinion that admission for OBSERVATION is reasonable and necessary because the patient's presenting complaints in the context of their chronic conditions represent sufficient risk of deterioration or significant morbidity to constitute reasonable grounds for close observation in the hospital setting, but that the patient may be medically stable for discharge from the hospital within 24 to 48 hours.    Medical decision making: Patient seen at 2:45 AM on 09/20/2016.  The patient was discussed with  [facility], PA-C.  What exists of the patient's chart was reviewed in depth and summarized above.  Clinical condition: stable.        Edwin Dada [facility] Pager [phone]

## 2016-09-20 NOTE — ED Notes (Signed)
Dr. Wendee Beavers contacted about insulin dose. States to hold six units of mel coverage and only give sliding scale which he recognizes as 20 units aspart for blood sugar 359. States to recheck CBG 90 minutes after admin

## 2016-09-20 NOTE — ED Notes (Signed)
Pt oob to br with assist. Pt slow but good balance. Pt voids andf has medium bowel movement. States pain improved after medication

## 2016-09-20 NOTE — ED Notes (Signed)
Pt sitting at side of bed with elbow on tray. States she feels hot. Oral temp 98.9

## 2016-09-20 NOTE — ED Provider Notes (Signed)
Care assumed from previous provider PA Gekas. Please see note for further details. Case discussed, plan agreed upon. Briefly, the patient here for abdominal pain with CT showing multiple hepatic masses. GI, Dr. Collene Mares was consulted who recommends MRI of the abdomen and medical admission. MRI was ordered. I spoke with hospitalist, Dr. Loleta Books, who will admit.   [name], Jamie Almond, PA-C 09/20/16 Strong, [place], DO 09/20/16 [phone]

## 2016-09-20 NOTE — ED Notes (Signed)
Patient transported to Ultrasound, unable to have MRI due to electrodes in her back.

## 2016-09-20 NOTE — ED Notes (Signed)
Dr. Wendee Beavers contacted re B repeat BP of 74/60. States to give insulin and 500cc ns bolus.

## 2016-09-20 NOTE — Progress Notes (Signed)
Patient seen and evaluated earlier the same by my associate. Please refer to H&P for details regarding assessment and plan.  Gastroenterology consultation plan is for further workup of liver masses.  Will reassess next am.  Gen: pt in nad, alert and awake CV: no cyanosis Pulm: equal chest rise.  Yani Lal, [employer]

## 2016-09-20 NOTE — ED Notes (Signed)
Sent message to pharmacy to verify medications

## 2016-09-20 NOTE — ED Notes (Signed)
Cancelled order for MRI because pt has a stimulator in place and cannot have an MRI because of same.

## 2016-09-20 NOTE — ED Notes (Signed)
Pt placed om regular bed. States this is great improvement

## 2016-09-20 NOTE — ED Notes (Signed)
REport to Nicki Reaper, RN Admitting MD contacted to let him know her pain is not controlled. And she has internal stimulator in spine.

## 2016-09-20 NOTE — ED Notes (Signed)
CBG 335

## 2016-09-20 NOTE — ED Notes (Signed)
Blood sugar 463 glucometer not crossing over

## 2016-09-20 NOTE — ED Notes (Signed)
Pt assisted to bathroom to void. moderate assist required.

## 2016-09-20 NOTE — ED Notes (Signed)
Patient transported back to Ultrasound to get more images.

## 2016-09-20 NOTE — ED Notes (Signed)
Dr. Wendee Beavers contacted with CBG results of 411 [place] to repeatr the insulin. Of 20 units aspart [place].

## 2016-09-21 ENCOUNTER — Encounter (HOSPITAL_COMMUNITY): Payer: Self-pay | Admitting: *Deleted

## 2016-09-21 ENCOUNTER — Observation Stay (HOSPITAL_BASED_OUTPATIENT_CLINIC_OR_DEPARTMENT_OTHER): Payer: Medicare PPO

## 2016-09-21 DIAGNOSIS — R162 Hepatomegaly with splenomegaly, not elsewhere classified: Secondary | ICD-10-CM | POA: Diagnosis not present

## 2016-09-21 DIAGNOSIS — I5030 Unspecified diastolic (congestive) heart failure: Secondary | ICD-10-CM

## 2016-09-21 DIAGNOSIS — R160 Hepatomegaly, not elsewhere classified: Secondary | ICD-10-CM | POA: Diagnosis not present

## 2016-09-21 LAB — BASIC METABOLIC PANEL
Anion gap: 9 (ref 5–15)
BUN: 27 mg/dL — ABNORMAL HIGH (ref 6–20)
CO2: 25 mmol/L (ref 22–32)
Calcium: 9 mg/dL (ref 8.9–10.3)
Chloride: 98 mmol/L — ABNORMAL LOW (ref 101–111)
Creatinine, Ser: 1.7 mg/dL — ABNORMAL HIGH (ref 0.44–1.00)
GFR, EST AFRICAN AMERICAN: 36 mL/min — AB (ref >60)
GFR, EST NON AFRICAN AMERICAN: 31 mL/min — AB (ref >60)
GLUCOSE: 343 mg/dL — AB (ref 65–99)
POTASSIUM: 4.2 mmol/L (ref 3.5–5.1)
Sodium: 132 mmol/L — ABNORMAL LOW (ref 135–145)

## 2016-09-21 LAB — COMPREHENSIVE METABOLIC PANEL
ALT: 283 U/L — AB (ref 14–54)
AST: 145 U/L — AB (ref 15–41)
Albumin: 3.1 g/dL — ABNORMAL LOW (ref 3.5–5.0)
Alkaline Phosphatase: 156 U/L — ABNORMAL HIGH (ref 38–126)
Anion gap: 8 (ref 5–15)
BUN: 28 mg/dL — AB (ref 6–20)
CHLORIDE: 101 mmol/L (ref 101–111)
CO2: 24 mmol/L (ref 22–32)
CREATININE: 1.64 mg/dL — AB (ref 0.44–1.00)
Calcium: 8.6 mg/dL — ABNORMAL LOW (ref 8.9–10.3)
GFR calc Af Amer: 38 mL/min — ABNORMAL LOW (ref >60)
GFR calc non Af Amer: 33 mL/min — ABNORMAL LOW (ref >60)
Glucose, Bld: 388 mg/dL — ABNORMAL HIGH (ref 65–99)
Potassium: 4.2 mmol/L (ref 3.5–5.1)
SODIUM: 133 mmol/L — AB (ref 135–145)
Total Bilirubin: 0.7 mg/dL (ref 0.3–1.2)
Total Protein: 6.5 g/dL (ref 6.5–8.1)

## 2016-09-21 LAB — CBC
HCT: 35.5 % — ABNORMAL LOW (ref 36.0–46.0)
Hemoglobin: 11.5 g/dL — ABNORMAL LOW (ref 12.0–15.0)
MCH: 27.9 pg (ref 26.0–34.0)
MCHC: 32.4 g/dL (ref 30.0–36.0)
MCV: 86.2 fL (ref 78.0–100.0)
Platelets: 148 10*3/uL — ABNORMAL LOW (ref 150–400)
RBC: 4.12 MIL/uL (ref 3.87–5.11)
RDW: 13.8 % (ref 11.5–15.5)
WBC: 3.9 10*3/uL — ABNORMAL LOW (ref 4.0–10.5)

## 2016-09-21 LAB — GLUCOSE, CAPILLARY
GLUCOSE-CAPILLARY: 426 mg/dL — AB (ref 65–99)
GLUCOSE-CAPILLARY: 215 mg/dL — AB (ref 65–99)
Glucose-Capillary: 388 mg/dL — ABNORMAL HIGH (ref 65–99)
Glucose-Capillary: 486 mg/dL — ABNORMAL HIGH (ref 65–99)
Glucose-Capillary: 442 mg/dL — ABNORMAL HIGH (ref 65–99)
Glucose-Capillary: 430 mg/dL — ABNORMAL HIGH (ref 65–99)
Glucose-Capillary: 374 mg/dL — ABNORMAL HIGH (ref 65–99)

## 2016-09-21 LAB — ECHOCARDIOGRAM COMPLETE
Height: 66 in
Weight: 3988.8 oz

## 2016-09-21 LAB — HEMOGLOBIN A1C
Hgb A1c MFr Bld: 12.2 % — ABNORMAL HIGH (ref 4.8–5.6)
MEAN PLASMA GLUCOSE: 303.44 mg/dL

## 2016-09-21 LAB — HIV ANTIBODY (ROUTINE TESTING W REFLEX): HIV SCREEN 4TH GENERATION: NONREACTIVE

## 2016-09-21 MED ORDER — INSULIN GLARGINE 100 UNIT/ML ~~LOC~~ SOLN
20.0000 [IU] | Freq: Once | SUBCUTANEOUS | Status: AC
  Administered 2016-09-21: 20 [IU] via SUBCUTANEOUS
  Filled 2016-09-21: qty 0.2

## 2016-09-21 MED ORDER — INSULIN GLARGINE 100 UNIT/ML ~~LOC~~ SOLN
50.0000 [IU] | Freq: Two times a day (BID) | SUBCUTANEOUS | Status: DC
  Administered 2016-09-21: 50 [IU] via SUBCUTANEOUS
  Filled 2016-09-21 (×2): qty 0.5

## 2016-09-21 MED ORDER — INSULIN ASPART 100 UNIT/ML ~~LOC~~ SOLN
10.0000 [IU] | Freq: Once | SUBCUTANEOUS | Status: AC
  Administered 2016-09-21: 10 [IU] via SUBCUTANEOUS

## 2016-09-21 MED ORDER — INSULIN GLARGINE 100 UNIT/ML ~~LOC~~ SOLN
45.0000 [IU] | Freq: Two times a day (BID) | SUBCUTANEOUS | Status: DC
  Filled 2016-09-21: qty 0.45

## 2016-09-21 MED ORDER — TAPENTADOL HCL 50 MG PO TABS
50.0000 mg | ORAL_TABLET | ORAL | Status: DC
  Administered 2016-09-21 – 2016-09-23 (×7): 50 mg via ORAL
  Filled 2016-09-21 (×7): qty 1

## 2016-09-21 NOTE — Progress Notes (Addendum)
Pt refused CPAP and RT removed it from room. Pt also states she does not wear one at home.

## 2016-09-21 NOTE — Progress Notes (Signed)
  Echocardiogram 2D Echocardiogram has been performed.  [name] 09/21/2016, 10:57 AM

## 2016-09-21 NOTE — Progress Notes (Signed)
Nurse tech informed me that patient's CBG was 486 when she checked it. I also received a phone call from patient's daughter [name] who was upset that Dr. Wendee Beavers told the patient he was going to discharging her today. She was wondering why they were told that GI was going to be consulted but they haven't seen the patient. I went into the patient's room to get consent to talk to her daughter since she was not listed on the contact list. When I went into the patient's room she started to express her concern about being discharged when her CBG was 486. I told her I was going to page Dr. Wendee Beavers to find out how much more insulin I needed to give. The order says to give 20 units for a CBG up to 400 and to notify the MD if greater than 400. I paged him and he called me back he told me to give her 10 units and give the scheduled Lantus 40 units. I expressed concern that 10 units would not be enough because I gave her 20 units of SSI at 0910 when her CBG was 388 and he told me to give her the 10 units SSI and 40 units of Lantus and recheck in 1 hour.

## 2016-09-21 NOTE — Progress Notes (Signed)
PROGRESS NOTE    [name]  EHM:[id number] DOB: [birth year] DOA: 09/19/2016 PCP: Wyatt Mage, MD   Brief Narrative:  61 year old who presented with nausea and emesis and new liver mass.   Assessment & Plan:   Principal Problem:   Liver mass - Ultrasound reporting that masses most likely consistent with hemangiomas and less likely metastases or cancer. Discussed with GI specialist who recommends outpatient monitoring and follow-up with them. Per my discussion with GI specialist they were asked to question not formally consulted initially.  Active Problems:   Chronic back pain - Continue supportive therapy    Essential hypertension - Stable patient is on hydrochlorothiazide, Coreg    Kidney disease, chronic, stage III (GFR 30-59 ml/min)   OSA on CPAP    Uncontrolled type 2 diabetes mellitus with diabetic polyneuropathy, with long-term current use of insulin (HCC) - Blood sugars uncontrolled and I suspect patient is noncompliant with diabetic regimen. Supposedly she is on 60 of long-acting insulin twice a day. We'll place on 50 units of Lantus twice a day. I have had to administer several doses of short-acting insulin trying to decrease blood sugars below 400s blood but patient seems to have resistance. As such increased Lantus dose   DVT prophylaxis: Lovenox Code Status: Full Family Communication: None at bedside. Disposition Plan: Pending improvement in condition   Consultants:   None   Procedures: None   Antimicrobials: None   Subjective: Pt is concerned about her blood sugars. No new complaints otherwise.  Objective: Vitals:   09/20/16 1930 09/20/16 2029 09/20/16 2245 09/21/16 0814  BP: (!) 146/79 127/61  (!) 115/58  Pulse: 77 82 71 67  Resp: 20 20  18   Temp:  99 F (37.2 C)  98.2 F (36.8 C)  TempSrc:  Oral  Oral  SpO2: 96% 96% 97% 98%  Weight:  113.1 kg (249 lb 4.8 oz)    Height:        Intake/Output Summary (Last 24 hours) at 09/21/16  1655 Last data filed at 09/21/16 0923  Gross per 24 hour  Intake             1080 ml  Output             1400 ml  Net             -320 ml   Filed Weights   09/19/16 1812 09/20/16 2029  Weight: 113.4 kg (250 lb) 113.1 kg (249 lb 4.8 oz)    Examination:  General exam: Appears calm and comfortable, in nad. Respiratory system: Clear to auscultation. Respiratory effort normal. Cardiovascular system: S1 & S2 heard, RRR. No JVD, murmurs, rubs, gallops or clicks. No pedal edema. Gastrointestinal system: Difficult exam due to morbid obesity, soft, nontender, no guarding, no rebound tenderness Central nervous system: Alert and oriented. No focal neurological deficits. Extremities: Symmetric 5 x 5 power. Skin: No rashes, lesions or ulcers, on limited exam. Psychiatry: Mood & affect appropriate.     Data Reviewed: I have personally reviewed following labs and imaging studies  CBC:  Recent Labs Lab 09/19/16 1816 09/21/16 0350  WBC 4.1 3.9*  HGB 11.8* 11.5*  HCT 35.9* 35.5*  MCV 86.7 86.2  PLT 147* 366*   Basic Metabolic Panel:  Recent Labs Lab 09/19/16 1816 09/21/16 0350  NA 129* 133*  K 4.3 4.2  CL 98* 101  CO2 22 24  GLUCOSE 571* 388*  BUN 30* 28*  CREATININE 1.83* 1.64*  CALCIUM 8.8* 8.6*  GFR: Estimated Creatinine Clearance: 45.9 mL/min (A) (by C-G formula based on SCr of 1.64 mg/dL (H)). Liver Function Tests:  Recent Labs Lab 09/19/16 1816 09/21/16 0350  AST 153* 145*  ALT 317* 283*  ALKPHOS 166* 156*  BILITOT 0.7 0.7  PROT 6.4* 6.5  ALBUMIN 3.2* 3.1*    Recent Labs Lab 09/19/16 1816  LIPASE 47   No results for input(s): AMMONIA in the last 168 hours. Coagulation Profile: No results for input(s): INR, PROTIME in the last 168 hours. Cardiac Enzymes: No results for input(s): CKTOTAL, CKMB, CKMBINDEX, TROPONINI in the last 168 hours. BNP (last 3 results) No results for input(s): PROBNP in the last 8760 hours. HbA1C:  Recent Labs   09/21/16 0350  HGBA1C 12.2*   CBG:  Recent Labs Lab 09/21/16 0730 09/21/16 1156 09/21/16 1320 09/21/16 1419 09/21/16 1555  GLUCAP 388* 486* 442* 430* 426*   Lipid Profile: No results for input(s): CHOL, HDL, LDLCALC, TRIG, CHOLHDL, LDLDIRECT in the last 72 hours. Thyroid Function Tests: No results for input(s): TSH, T4TOTAL, FREET4, T3FREE, THYROIDAB in the last 72 hours. Anemia Panel: No results for input(s): VITAMINB12, FOLATE, FERRITIN, TIBC, IRON, RETICCTPCT in the last 72 hours. Sepsis Labs: No results for input(s): PROCALCITON, LATICACIDVEN in the last 168 hours.  Recent Results (from the past 240 hour(s))  Culture, blood (routine x 2)     Status: None (Preliminary result)   Collection Time: 09/20/16  9:45 PM  Result Value Ref Range Status   Specimen Description BLOOD LEFT HAND  Final   Special Requests   Final    BOTTLES DRAWN AEROBIC AND ANAEROBIC Blood Culture adequate volume   Culture NO GROWTH < 12 HOURS  Final   Report Status PENDING  Incomplete  Culture, blood (routine x 2)     Status: None (Preliminary result)   Collection Time: 09/20/16  9:50 PM  Result Value Ref Range Status   Specimen Description BLOOD RIGHT HAND  Final   Special Requests   Final    BOTTLES DRAWN AEROBIC AND ANAEROBIC Blood Culture adequate volume   Culture NO GROWTH < 12 HOURS  Final   Report Status PENDING  Incomplete         Radiology Studies: Ct Abdomen Pelvis Wo Contrast  Result Date: 09/20/2016 CLINICAL DATA:  RIGHT upper quadrant pain beginning yesterday. EXAM: CT ABDOMEN AND PELVIS WITHOUT CONTRAST TECHNIQUE: Multidetector CT imaging of the abdomen and pelvis was performed following the standard protocol without IV contrast. GFR 29. COMPARISON:  None. FINDINGS: LOWER CHEST: Lung bases are clear. The visualized heart size is normal. No pericardial effusion. HEPATOBILIARY: Irregular hypodensities in the periphery of the liver, most conspicuous within the LEFT lobe (23  Hounsfield units). Mild hepatomegaly. No hepatic calcifications. Status post cholecystectomy. PANCREAS: Normal. SPLEEN: Normal. ADRENALS/URINARY TRACT: Kidneys are orthotopic, demonstrating normal size and morphology. No nephrolithiasis, hydronephrosis; limited assessment for renal masses on this nonenhanced examination. The unopacified ureters are normal in course and caliber. Urinary bladder is partially distended and unremarkable. Normal adrenal glands. STOMACH/BOWEL: The stomach, small and large bowel are normal in course and caliber without inflammatory changes, sensitivity decreased by lack of enteric contrast. Normal appendix. VASCULAR/LYMPHATIC: Aortoiliac vessels are normal in course and caliber. Normal mild calcific atherosclerosis. 17 mm round portal caval lymph node. Additional smaller porta hepatis lymph nodes. REPRODUCTIVE: Status post hysterectomy. OTHER: No intraperitoneal free fluid or free air. MUSCULOSKELETAL: Non-acute. Anterior abdominal wall ligamentous laxity. Spinal stimulator battery pack RIGHT gluteal soft tissues, leading at T8. Anterior  abdominal wall scarring. Moderate sacroiliac osteoarthrosis. 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 IMPRESSION: 1. Irregular peripheral hepatic masses. Mild perihepatic lymphadenopathy. Differential diagnosis includes infarcts, infection, atypical hemangiomas and though less likely, metastasis. Given renal failure and, spinal stimulator, recommend ultrasound. 2. Status post cholecystectomy.  Normal appendix. Aortic Atherosclerosis (ICD10-I70.0). Electronically Signed   By: Elon Alas M.D.   On: 09/20/2016 00:43   US Abdomen Complete  Result Date: 09/20/2016 CLINICAL DATA:  Follow-up liver masses.  Abdominal pain for week. EXAM: ABDOMEN ULTRASOUND COMPLETE COMPARISON:  CT abdomen and pelvis September 20, 2016 at 0021 hours FINDINGS: Habitus limited examination. Gallbladder: Status post  cholecystectomy. No fluid collections within the gallbladder fossa. Common bile duct: Diameter: 6 mm Liver: Mildly echogenic peripheral lesion RIGHT lobe measuring at least 8.3 x 2.1 cm without collar flow. Heterogeneous echogenicity without discrete mass. Patent hepatopetal portal vein. IVC: No abnormality visualized. Pancreas: Visualized portion unremarkable. Spleen: Splenomegaly, 986 cc. Right Kidney: Length: 12.9 cm. Echogenicity within normal limits. No mass or hydronephrosis visualized. Left Kidney: Length: 12.8 cm. Echogenicity within normal limits. No mass or hydronephrosis visualized. Abdominal aorta: No aneurysm visualized. Other findings: None. IMPRESSION: 1. Habitus limited examination. 2. Irregular echogenic lesion corresponding to CT abnormality, this could represent blood products, hemangioma, less likely tumor. Recommend contrast-enhanced cross-sectional imaging (MRI preferable if patient spinal stimulator is MR compatible) and when patient's kidney function improves. 3. Splenomegaly. Electronically Signed   By: Elon Alas M.D.   On: 09/20/2016 06:50        Scheduled Meds: . busPIRone  15 mg Oral TID  . carvedilol  25 mg Oral BID  . enoxaparin (LOVENOX) injection  40 mg Subcutaneous Q24H  . gabapentin  300 mg Oral TID  . hydrochlorothiazide  25 mg Oral Daily  . insulin aspart  0-20 Units Subcutaneous TID WC  . insulin aspart  0-5 Units Subcutaneous QHS  . insulin glargine  20 Units Subcutaneous Once  . insulin glargine  45 Units Subcutaneous BID  . sertraline  50 mg Oral q1800  . tapentadol  50 mg Oral 3 times per day   Continuous Infusions:   LOS: 0 days    Time spent: > 35 minutes  Velvet Bathe, MD [facility] Pager [phone]  If 7PM-7AM, please contact night-coverage [email or web address] Password [email or web address] 09/21/2016, 4:55 PM

## 2016-09-21 NOTE — Progress Notes (Signed)
Inpatient Diabetes Program Recommendations  AACE/ADA: New Consensus Statement on Inpatient Glycemic Control (2015)  Target Ranges:  Prepandial:   less than 140 mg/dL      Peak postprandial:   less than 180 mg/dL (1-2 hours)      Critically ill patients:  140 - 180 mg/dL   Lab Results  Component Value Date   GLUCAP 388 (H) 09/21/2016   HGBA1C 12.2 (H) 09/21/2016    Review of Glycemic Control Results for Ausmus, Sherida (MRN [id number]) as of 09/21/2016 10:43  Ref. Range 09/20/2016 15:25 09/20/2016 18:17 09/20/2016 18:49 09/20/2016 21:19 09/21/2016 07:30  Glucose-Capillary Latest Ref Range: 65 - 99 mg/dL 290 (H) 318 (H) 335 (H) 378 (H) 388 (H)   Diabetes history: DM2 Outpatient Diabetes medications: Lantus 60 units bid + Novolog 2-10 units tid meal coverage + [place] 5 bid Current orders for Inpatient glycemic control: Lantus 40 units qd + Novolog correction 0-20 units tid + 0-5 units hs  Inpatient Diabetes Program Recommendations:  Patient has received 75 units Novolog correction over the past 24 hrs. -Increase Lantus to 40 units bid -Novolog 5 units meal coverage if eats 50%  Will follow.  Thank you, Nani Gasser. Maggie Senseney, RN, MSN, CDE  Diabetes Coordinator Inpatient Glycemic Control Team Team Pager [phone] (8am-5pm) 09/21/2016 10:48 AM

## 2016-09-22 DIAGNOSIS — R160 Hepatomegaly, not elsewhere classified: Secondary | ICD-10-CM | POA: Diagnosis not present

## 2016-09-22 DIAGNOSIS — R162 Hepatomegaly with splenomegaly, not elsewhere classified: Secondary | ICD-10-CM | POA: Diagnosis not present

## 2016-09-22 LAB — COMPREHENSIVE METABOLIC PANEL
ALBUMIN: 3 g/dL — AB (ref 3.5–5.0)
ALK PHOS: 157 U/L — AB (ref 38–126)
ALT: 240 U/L — ABNORMAL HIGH (ref 14–54)
AST: 134 U/L — AB (ref 15–41)
Anion gap: 7 (ref 5–15)
BILIRUBIN TOTAL: 0.8 mg/dL (ref 0.3–1.2)
BUN: 29 mg/dL — AB (ref 6–20)
CALCIUM: 8.7 mg/dL — AB (ref 8.9–10.3)
CO2: 23 mmol/L (ref 22–32)
Chloride: 104 mmol/L (ref 101–111)
Creatinine, Ser: 1.63 mg/dL — ABNORMAL HIGH (ref 0.44–1.00)
GFR calc Af Amer: 38 mL/min — ABNORMAL LOW (ref >60)
GFR calc non Af Amer: 33 mL/min — ABNORMAL LOW (ref >60)
GLUCOSE: 306 mg/dL — AB (ref 65–99)
Potassium: 4.2 mmol/L (ref 3.5–5.1)
Sodium: 134 mmol/L — ABNORMAL LOW (ref 135–145)
TOTAL PROTEIN: 5.9 g/dL — AB (ref 6.5–8.1)

## 2016-09-22 LAB — GLUCOSE, CAPILLARY
GLUCOSE-CAPILLARY: 333 mg/dL — AB (ref 65–99)
Glucose-Capillary: 339 mg/dL — ABNORMAL HIGH (ref 65–99)
Glucose-Capillary: 357 mg/dL — ABNORMAL HIGH (ref 65–99)
Glucose-Capillary: 327 mg/dL — ABNORMAL HIGH (ref 65–99)

## 2016-09-22 MED ORDER — INSULIN GLARGINE 100 UNIT/ML ~~LOC~~ SOLN
60.0000 [IU] | Freq: Two times a day (BID) | SUBCUTANEOUS | Status: DC
  Administered 2016-09-22 – 2016-09-23 (×2): 60 [IU] via SUBCUTANEOUS
  Filled 2016-09-22 (×3): qty 0.6

## 2016-09-22 MED ORDER — INSULIN GLARGINE 100 UNIT/ML ~~LOC~~ SOLN
55.0000 [IU] | Freq: Two times a day (BID) | SUBCUTANEOUS | Status: DC
  Administered 2016-09-22: 55 [IU] via SUBCUTANEOUS
  Filled 2016-09-22 (×2): qty 0.55

## 2016-09-22 NOTE — Progress Notes (Signed)
Was paged by nursing because blood sugars still elevated above 300s. As such will place back on long-acting insulin is 60 units subcutaneous twice a day.  Adonis Ryther, [employer]

## 2016-09-22 NOTE — Care Management Obs Status (Signed)
[place] NOTIFICATION   Patient Details  Name: Parrish Bonilla MRN: [id number] Date of Birth: May 27, 1955   Medicare Observation Status Notification Given:  Yes    [place] Yeagley, Rory Percy, RN 09/22/2016, 1:47 PM

## 2016-09-22 NOTE — Plan of Care (Signed)
Problem: Physical Regulation: Goal: Ability to maintain clinical measurements within normal limits will improve Outcome: Progressing Receiving Nucynta - resting well.

## 2016-09-22 NOTE — Care Management (Signed)
09/22/2016 Met with pt and daughter re OBS status and ABN issues. Pt reviewing both forms for possible discharge. Pt and daughter verbalize understanding however would like to be inpt status if possible and states that she has secondary insurances.

## 2016-09-22 NOTE — Progress Notes (Signed)
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  Ultrasound of Liver reporting suspected hemangiomas. Patient has had cholecystectomy. Will place order for MRI of liver. Discussed with nursing and placed comment in order for radiology to investigate if stimulator is MRI compatible.    Assessment & Plan:   Principal Problem:   Liver mass - Ultrasound reporting that masses most likely consistent with hemangiomas and less likely metastases or cancer. Discussed with GI specialist (Dr. Collene Mares) who recommended outpatient monitoring. Will place order for MRI of liver to further evaluate but if unable to evaluate will need further monitoring as outpatient by GI (wrote comment in order for radiology to investigate, and discussed with nursing who will call radiology).   Active Problems:   Chronic back pain - Continue supportive therapy    Essential hypertension - Stable patient is on hydrochlorothiazide, Coreg    Kidney disease, chronic, stage III (GFR 30-59 ml/min)   OSA on CPAP    Uncontrolled type 2 diabetes mellitus with diabetic polyneuropathy, with long-term current use of insulin (HCC) - Blood sugars uncontrolled - increased Lantus dose up to 55 units SQ daily BID (if not much improvement will recommend placing back on reported home dose of 60 units sq bid) - continue SSI resistant.  DVT prophylaxis: Lovenox Code Status: Full Family Communication: None at bedside. Disposition Plan: Pending improvement in condition   Consultants:   None   Procedures: None   Antimicrobials: None   Subjective: Pt states she feels some nausea  today. Otherwise no new complaints.  Objective: Vitals:   09/21/16 1726 09/21/16 2144 09/22/16 0536 09/22/16 0757  BP: 107/61 140/70 (!) 115/57 136/70  Pulse: 64 72 73 69  Resp: 18 18 18 18   Temp: 98.6 F (37 C) 98.6 F (37 C) 98.5 F (36.9 C) 98.6 F (37 C)  TempSrc: Oral Oral Oral Oral  SpO2: 100% 95% 98% 96%  Weight:  109.8 kg (242 lb 1.6 oz)    Height:        Intake/Output Summary (Last 24 hours) at 09/22/16 1516 Last data filed at 09/22/16 8182  Gross per 24 hour  Intake              600 ml  Output                0 ml  Net              600 ml   Filed Weights   09/19/16 1812 09/20/16 2029 09/21/16 2144  Weight: 113.4 kg (250 lb) 113.1 kg (249 lb 4.8 oz) 109.8 kg (242 lb 1.6 oz)    Examination:  General exam: in nad, alert and awake Respiratory system: Clear to auscultation. Respiratory effort normal. Equal chest rise. Cardiovascular system: S1 & S2 heard, RRR. No JVD, murmurs, rubs, gallops or clicks. No pedal edema. Gastrointestinal system: Difficult exam due to morbid obesity, soft, nontender, no guarding, no rebound tenderness Central nervous system: Alert and oriented. No focal neurological deficits. Extremities: Symmetric 5 x 5 power. Skin: No rashes, lesions or ulcers, on limited exam. Psychiatry: Mood & affect appropriate.   Data Reviewed: I have personally reviewed  following labs and imaging studies  CBC:  Recent Labs Lab 09/19/16 1816 09/21/16 0350  WBC 4.1 3.9*  HGB 11.8* 11.5*  HCT 35.9* 35.5*  MCV 86.7 86.2  PLT 147* 903*   Basic Metabolic Panel:  Recent Labs Lab 09/19/16 1816 09/21/16 0350 09/21/16 1822 09/22/16 0405  NA 129* 133* 132* 134*  K 4.3 4.2 4.2 4.2  CL 98* 101 98* 104  CO2 22 24 25 23   GLUCOSE 571* 388* 343* 306*  BUN 30* 28* 27* 29*  CREATININE 1.83* 1.64* 1.70* 1.63*  CALCIUM 8.8* 8.6* 9.0 8.7*   GFR: Estimated Creatinine Clearance: 45.5 mL/min (A) (by C-G formula based on SCr of 1.63 mg/dL (H)). Liver Function  Tests:  Recent Labs Lab 09/19/16 1816 09/21/16 0350 09/22/16 0405  AST 153* 145* 134*  ALT 317* 283* 240*  ALKPHOS 166* 156* 157*  BILITOT 0.7 0.7 0.8  PROT 6.4* 6.5 5.9*  ALBUMIN 3.2* 3.1* 3.0*    Recent Labs Lab 09/19/16 1816  LIPASE 47   No results for input(s): AMMONIA in the last 168 hours. Coagulation Profile: No results for input(s): INR, PROTIME in the last 168 hours. Cardiac Enzymes: No results for input(s): CKTOTAL, CKMB, CKMBINDEX, TROPONINI in the last 168 hours. BNP (last 3 results) No results for input(s): PROBNP in the last 8760 hours. HbA1C:  Recent Labs  09/21/16 0350  HGBA1C 12.2*   CBG:  Recent Labs Lab 09/21/16 1555 09/21/16 1701 09/21/16 2143 09/22/16 0747 09/22/16 1212  GLUCAP 426* 374* 215* 339* 357*   Lipid Profile: No results for input(s): CHOL, HDL, LDLCALC, TRIG, CHOLHDL, LDLDIRECT in the last 72 hours. Thyroid Function Tests: No results for input(s): TSH, T4TOTAL, FREET4, T3FREE, THYROIDAB in the last 72 hours. Anemia Panel: No results for input(s): VITAMINB12, FOLATE, FERRITIN, TIBC, IRON, RETICCTPCT in the last 72 hours. Sepsis Labs: No results for input(s): PROCALCITON, LATICACIDVEN in the last 168 hours.  Recent Results (from the past 240 hour(s))  Culture, blood (routine x 2)     Status: None (Preliminary result)   Collection Time: 09/20/16  9:45 PM  Result Value Ref Range Status   Specimen Description BLOOD LEFT HAND  Final   Special Requests   Final    BOTTLES DRAWN AEROBIC AND ANAEROBIC Blood Culture adequate volume   Culture NO GROWTH 2 DAYS  Final   Report Status PENDING  Incomplete  Culture, blood (routine x 2)     Status: None (Preliminary result)   Collection Time: 09/20/16  9:50 PM  Result Value Ref Range Status   Specimen Description BLOOD RIGHT HAND  Final   Special Requests   Final    BOTTLES DRAWN AEROBIC AND ANAEROBIC Blood Culture adequate volume   Culture NO GROWTH 2 DAYS  Final   Report Status  PENDING  Incomplete         Radiology Studies: No results found.      Scheduled Meds: . busPIRone  15 mg Oral TID  . carvedilol  25 mg Oral BID  . enoxaparin (LOVENOX) injection  40 mg Subcutaneous Q24H  . gabapentin  300 mg Oral TID  . hydrochlorothiazide  25 mg Oral Daily  . insulin aspart  0-20 Units Subcutaneous TID WC  . insulin aspart  0-5 Units Subcutaneous QHS  . insulin glargine  55 Units Subcutaneous BID  . sertraline  50 mg Oral q1800  . tapentadol  50 mg Oral 3 times per day   Continuous Infusions:   LOS: 0 days  Time spent: > 35 minutes  Velvet Bathe, MD [facility] Pager [phone]  If 7PM-7AM, please contact night-coverage [email or web address] Password [facility] 09/22/2016, 3:16 PM

## 2016-09-22 NOTE — Progress Notes (Signed)
Pt declined CPAP use- no machine in room

## 2016-09-23 DIAGNOSIS — N183 Chronic kidney disease, stage 3 (moderate): Secondary | ICD-10-CM | POA: Diagnosis not present

## 2016-09-23 DIAGNOSIS — R160 Hepatomegaly, not elsewhere classified: Secondary | ICD-10-CM | POA: Diagnosis not present

## 2016-09-23 DIAGNOSIS — G8929 Other chronic pain: Secondary | ICD-10-CM | POA: Diagnosis not present

## 2016-09-23 DIAGNOSIS — R740 Nonspecific elevation of levels of transaminase and lactic acid dehydrogenase [LDH]: Secondary | ICD-10-CM | POA: Diagnosis not present

## 2016-09-23 DIAGNOSIS — R162 Hepatomegaly with splenomegaly, not elsewhere classified: Secondary | ICD-10-CM | POA: Diagnosis not present

## 2016-09-23 DIAGNOSIS — M549 Dorsalgia, unspecified: Secondary | ICD-10-CM

## 2016-09-23 DIAGNOSIS — I10 Essential (primary) hypertension: Secondary | ICD-10-CM | POA: Diagnosis not present

## 2016-09-23 LAB — GLUCOSE, CAPILLARY
GLUCOSE-CAPILLARY: 258 mg/dL — AB (ref 65–99)
GLUCOSE-CAPILLARY: 272 mg/dL — AB (ref 65–99)

## 2016-09-23 NOTE — Discharge Summary (Signed)
Physician Discharge Summary  [name] KYH:[id number] DOB: Jun 01, 1955  PCP: Wyatt Mage, MD  Admit date: 09/19/2016 Discharge date: 09/23/2016  Recommendations for Outpatient Follow-up:  1. Dr. Vladimir Crofts, PCP in 5 days with repeat labs (CBC & CMP). Please follow final blood culture results that were sent from the hospital. 2. Dr. Juanita Craver, GI on 09/29/2016 at 8:30 am.  Home Health: None Equipment/Devices: None    Discharge Condition: Improved and stable.  CODE STATUS: Full  Diet recommendation: Heart healthy & diabetic diet.  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 in [place]), ovarian serous cystadenoma and stroke presented with complaints of nausea and nonbloody emesis. She was in her usual state of health until a few weeks ago when she started noticing progressive fatigue. A week or so prior to admission, her daughter invited her down to visit her in [place] and when she got here daughter thought that she "wasn't herself". Then couple days prior to admission, she developed a couple of episodes of nonbloody emesis and was seen at [facility] ED and noted to have elevated LFTs, assessed as NASH and patient insisted and she was discharged home. Since then she continued to feel tired and vomited again and her daughter brought her to [facility] ED for further evaluation. She denied abdominal pain, constipation or diarrhea.  Normal appetite. No weight loss. Denied fever or chills. In the ED, creatinine 1.83 (baseline 1.5-1.6), CT abdomen showed peripheral hepatic "masses" with differentials including infarcts, infection, less likely metastasis. Her case was discussed with GI on call who recommended MRI and hospitalist admission.  Assessment and plan:  1. Liver masses with abnormal LFTs: Unclear etiology. Upon review of her chart in care everywhere, she had normal LFTs on 04/22/15. On 09/18/16, at [facility] ED, she had ALT 331, AST 177, normal bilirubin. She was seen in ED at [facility] and assessed as NASH. CT abdomen and pelvis without contrast 09/20/16: Irregular peripheral hepatic masses. Mild perihepatic lymphadenopathy. Differential diagnosis includes infarcts, infection, atypical hemangiomas and though less likely, metastasis. Given renal failure and presence of spinal stimulator, abdominal ultrasound was recommended by radiology. Patient is status post cholecystectomy. Abdominal ultrasound showed irregular echogenic lesion corresponding to CT abnormality, this could represent blood products, hemangioma, less likely tumor. Again MRI was preferred. I discussed with radiology/MRI today who indicated that in the absence of a handheld device that patient should have which she doesn't currently have, they cannot assess MR compatibility and moreover the spinal stimulator may cause artifacts and may not be very helpful. Clinically she has done better, no further emesis, mild intermittent nausea, tolerating diet, no diarrhea or constipation or abdominal pain. LFTs/transaminitis have slightly improved. I are slowly discussed with Dr. Collene Mares, GI who recommended that patient can be discharged home and she is happy to see her in consultation in the office on 8/28. Statins on hold due to abnormal LFTs. I discussed this in detail with patient and daughter at bedside and they  are agreeable with this plan of care. Blood cultures 2: Negative to date.  2-D echo results as below and unremarkable. HIV screen: Nonreactive. It appears that hepatitis panel was not sent from the hospital and can be pursued as outpatient during GI consultation, if deemed necessary. 2. Chronic pain/status post spinal stimulator: Follows with pain management in [place]. Controlled. Continue home medications including Nucynta, gabapentin, sertraline and BuSpar. Patient reports some lower extremity weakness symptoms at times due to her peripheral neuropathy but denies any falls and is not concerned about unsteady gait or falls, has been ambulating comfortably to the bathroom, declined PT evaluation. 3. Uncontrolled type II DM/IDDM with peripheral neuropathy: Patient was initially placed on reduced and home dose of Lantus and SSI which obviously caused worsening of her hyperglycemia. Her Lantus was increased back to home dose with improved control. A1c 12.2 suggests poor outpatient control. Continue home regimen at this time and patient was advised to follow-up with her PCP regarding further management. She verbalized understanding. 4. Essential hypertension: Held lisinopril due to creatinine on admission mildly worse than baseline and soft blood pressures. Outpatient follow-up with PCP to determine if this can be resumed. Continue carvedilol and HCTZ. Controlled. 5. OSA: Continue CPAP. 6. Stage III chronic kidney disease: Baseline creatinine probably in the 1.5-1.6 range. She presented with creatinine of 1.8 which has improved to 1.6. As stated above, holding lisinopril. Follow BMP in a few days as outpatient. 7. Anemia and thrombocytopenia: Stable.? Related to liver disease. No bleeding reported. Outpatient follow-up with repeat CBCs.   Consultations:  Discussed multiple times with GI/Dr. Collene Mares.   Procedures:  2 D Echo 09/21/2016:  Study Conclusions  - Procedure narrative: Transthoracic echocardiography. Image   quality was poor. The study was technically difficult, as  a   result of poor sound wave transmission and body habitus. - Left ventricle: The cavity size was normal. There was mild focal   basal hypertrophy of the septum. Systolic function was normal.   The estimated ejection fraction was in the range of 60% to 65%.   Wall motion was normal; there were no regional wall motion   abnormalities. Doppler parameters are consistent with abnormal   left ventricular relaxation (grade 1 diastolic dysfunction). - Ascending aorta: The ascending aorta was mildly dilated.  Impressions:  - Normal LV systolic function; mild diastolic dysfunction;   sclerotic aortic valve; mildly dilated ascending aorta   Discharge Instructions  Discharge Instructions    Call MD for:  extreme fatigue    Complete by:  As directed    Call MD for:  persistant dizziness or light-headedness    Complete by:  As directed    Call MD for:  persistant nausea and vomiting    Complete by:  As directed    Call MD for:  severe uncontrolled pain    Complete by:  As directed    Diet - low sodium heart healthy    Complete by:  As directed    Diet Carb Modified    Complete by:  As directed    Increase activity slowly    Complete by:  As directed        Medication List    STOP taking these medications   acetaminophen 500 MG tablet Commonly known as:  TYLENOL   lisinopril 40 MG tablet Commonly known as:  PRINIVIL,ZESTRIL   lovastatin 40 MG tablet Commonly known as:  MEVACOR     TAKE these medications   busPIRone 15 MG tablet  Commonly known as:  BUSPAR Take 15 mg by mouth 3 (three) times daily.   BYETTA 5 MCG PEN 5 MCG/0.02ML Sopn injection Generic drug:  exenatide Inject 0.02 mLs into the skin 2 (two) times daily.   carvedilol 25 MG tablet Commonly known as:  COREG Take 25 mg by mouth 2 (two) times daily.   gabapentin 100 MG capsule Commonly known as:  NEURONTIN Take 300 mg by mouth 3 (three) times daily.   hydrochlorothiazide 25 MG tablet Commonly known as:   HYDRODIURIL Take 25 mg by mouth daily.   insulin aspart 100 UNIT/ML FlexPen Commonly known as:  NOVOLOG Inject 2-10 Units into the skin See admin instructions. Based on sliding scale. Max 60 units a day.   LANTUS 100 UNIT/ML injection Generic drug:  insulin glargine Inject 60 Units into the skin 2 (two) times daily.   sertraline 50 MG tablet Commonly known as:  ZOLOFT Take 50 mg by mouth daily.   tapentadol 50 MG tablet Commonly known as:  NUCYNTA Take 50 mg by mouth 3 (three) times daily.      Follow-up Information    Laws, Malachy Chamber, MD. Schedule an appointment as soon as possible for a visit in 5 day(s).   Specialty:  Internal Medicine Why:  To be seen with repeat labs (CBC & CMP). Contact information: [facility] [address] [place] [address] [phone]        Juanita Craver, MD Follow up on 09/29/2016.   Specialty:  Gastroenterology Why:  8:30 AM.  Contact information: [address], [place] [address] [phone]          Allergies  Allergen Reactions  . Vancomycin Other (See Comments)    Total organ shut down  . Doxycycline Itching  . Inderal [Propranolol] Cough  . Lasix [Furosemide] Other (See Comments)    Dehydration leading to kidney failure  . Norvasc [Amlodipine Besylate] Other (See Comments)    Severe edema  . Prednisone Other (See Comments)    ketoacidosis  . Sulfa Antibiotics Nausea Only      Procedures/Studies: Ct Abdomen Pelvis Wo Contrast  Result Date: 09/20/2016 CLINICAL DATA:  RIGHT upper quadrant pain beginning yesterday. EXAM: CT ABDOMEN AND PELVIS WITHOUT CONTRAST TECHNIQUE: Multidetector CT imaging of the abdomen and pelvis was performed following the standard protocol without IV contrast. GFR 29. COMPARISON:  None. FINDINGS: LOWER CHEST: Lung bases are clear. The visualized heart size is normal. No pericardial effusion. HEPATOBILIARY: Irregular hypodensities in the periphery of the liver, most conspicuous within  the LEFT lobe (23 Hounsfield units). Mild hepatomegaly. No hepatic calcifications. Status post cholecystectomy. PANCREAS: Normal. SPLEEN: Normal. ADRENALS/URINARY TRACT: Kidneys are orthotopic, demonstrating normal size and morphology. No nephrolithiasis, hydronephrosis; limited assessment for renal masses on this nonenhanced examination. The unopacified ureters are normal in course and caliber. Urinary bladder is partially distended and unremarkable. Normal adrenal glands. STOMACH/BOWEL: The stomach, small and large bowel are normal in course and caliber without inflammatory changes, sensitivity decreased by lack of enteric contrast. Normal appendix. VASCULAR/LYMPHATIC: Aortoiliac vessels are normal in course and caliber. Normal mild calcific atherosclerosis. 17 mm round portal caval lymph node. Additional smaller porta hepatis lymph nodes. REPRODUCTIVE: Status post hysterectomy. OTHER: No intraperitoneal free fluid or free air. MUSCULOSKELETAL: Non-acute. Anterior abdominal wall ligamentous laxity. Spinal stimulator battery pack RIGHT gluteal soft tissues, leading at T8. Anterior abdominal wall scarring. Moderate sacroiliac osteoarthrosis. 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 IMPRESSION: 1. Irregular peripheral hepatic  masses. Mild perihepatic lymphadenopathy. Differential diagnosis includes infarcts, infection, atypical hemangiomas and though less likely, metastasis. Given renal failure and, spinal stimulator, recommend ultrasound. 2. Status post cholecystectomy.  Normal appendix. Aortic Atherosclerosis (ICD10-I70.0). Electronically Signed   By: Elon Alas M.D.   On: 09/20/2016 00:43   US Abdomen Complete  Result Date: 09/20/2016 CLINICAL DATA:  Follow-up liver masses.  Abdominal pain for week. EXAM: ABDOMEN ULTRASOUND COMPLETE COMPARISON:  CT abdomen and pelvis September 20, 2016 at 0021 hours FINDINGS: Habitus limited examination. Gallbladder: Status  post cholecystectomy. No fluid collections within the gallbladder fossa. Common bile duct: Diameter: 6 mm Liver: Mildly echogenic peripheral lesion RIGHT lobe measuring at least 8.3 x 2.1 cm without collar flow. Heterogeneous echogenicity without discrete mass. Patent hepatopetal portal vein. IVC: No abnormality visualized. Pancreas: Visualized portion unremarkable. Spleen: Splenomegaly, 986 cc. Right Kidney: Length: 12.9 cm. Echogenicity within normal limits. No mass or hydronephrosis visualized. Left Kidney: Length: 12.8 cm. Echogenicity within normal limits. No mass or hydronephrosis visualized. Abdominal aorta: No aneurysm visualized. Other findings: None. IMPRESSION: 1. Habitus limited examination. 2. Irregular echogenic lesion corresponding to CT abnormality, this could represent blood products, hemangioma, less likely tumor. Recommend contrast-enhanced cross-sectional imaging (MRI preferable if patient spinal stimulator is MR compatible) and when patient's kidney function improves. 3. Splenomegaly. Electronically Signed   By: Elon Alas M.D.   On: 09/20/2016 06:50      Subjective: Seen this morning. Patient's daughter at bedside. She states that she feels better and is anxious to go home. Occasional mild nausea but no vomiting. Tolerating diet. No abdominal pain. No constipation or diarrhea reported. Denies dizziness, lightheadedness, gait issues. States that she has been walking steadily from the bathroom and back. Declines PT evaluation.  Discharge Exam:  Vitals:   09/22/16 1718 09/22/16 2321 09/23/16 0438 09/23/16 0904  BP: 140/76 119/67 (!) 94/48 (!) 103/59  Pulse: 67 65 64 71  Resp: 18 18 17 17   Temp: 98.5 F (36.9 C) 98.6 F (37 C) 98.4 F (36.9 C) 98.8 F (37.1 C)  TempSrc: Oral Oral Oral Oral  SpO2: 96% 98% 93% 94%  Weight:  113.4 kg (250 lb)    Height:  5' 6"  (1.676 m)      General: Pleasant middle-aged female, moderately built and obese, sitting up comfortably in  bed. Cardiovascular: S1 & S2 heard, RRR, S1/S2 +. No murmurs, rubs, gallops or clicks. No JVD or pedal edema. Respiratory: Clear to auscultation without wheezing, rhonchi or crackles. No increased work of breathing. Abdominal:  Non distended, and soft. Mild RUQ tenderness without Murphy sign or peritoneal signs. No organomegaly or masses appreciated. Normal bowel sounds heard. CNS: Alert and oriented. No focal deficits. Extremities: no edema, no cyanosis. Multiple to auscultations on left foot.    The results of significant diagnostics from this hospitalization (including imaging, microbiology, ancillary and laboratory) are listed below for reference.     Microbiology: Recent Results (from the past 240 hour(s))  Culture, blood (routine x 2)     Status: None (Preliminary result)   Collection Time: 09/20/16  9:45 PM  Result Value Ref Range Status   Specimen Description BLOOD LEFT HAND  Final   Special Requests   Final    BOTTLES DRAWN AEROBIC AND ANAEROBIC Blood Culture adequate volume   Culture NO GROWTH 3 DAYS  Final   Report Status PENDING  Incomplete  Culture, blood (routine x 2)     Status: None (Preliminary result)   Collection Time: 09/20/16  9:50  PM  Result Value Ref Range Status   Specimen Description BLOOD RIGHT HAND  Final   Special Requests   Final    BOTTLES DRAWN AEROBIC AND ANAEROBIC Blood Culture adequate volume   Culture NO GROWTH 3 DAYS  Final   Report Status PENDING  Incomplete     Labs: CBC:  Recent Labs Lab 09/19/16 1816 09/21/16 0350  WBC 4.1 3.9*  HGB 11.8* 11.5*  HCT 35.9* 35.5*  MCV 86.7 86.2  PLT 147* 208*   Basic Metabolic Panel:  Recent Labs Lab 09/19/16 1816 09/21/16 0350 09/21/16 1822 09/22/16 0405  NA 129* 133* 132* 134*  K 4.3 4.2 4.2 4.2  CL 98* 101 98* 104  CO2 22 24 25 23   GLUCOSE 571* 388* 343* 306*  BUN 30* 28* 27* 29*  CREATININE 1.83* 1.64* 1.70* 1.63*  CALCIUM 8.8* 8.6* 9.0 8.7*   Liver Function Tests:  Recent  Labs Lab 09/19/16 1816 09/21/16 0350 09/22/16 0405  AST 153* 145* 134*  ALT 317* 283* 240*  ALKPHOS 166* 156* 157*  BILITOT 0.7 0.7 0.8  PROT 6.4* 6.5 5.9*  ALBUMIN 3.2* 3.1* 3.0*   CBG:  Recent Labs Lab 09/22/16 1212 09/22/16 1654 09/22/16 2118 09/23/16 0740 09/23/16 1149  GLUCAP 357* 327* 333* 258* 272*   Hgb A1c  Recent Labs  09/21/16 0350  HGBA1C 12.2*   Urinalysis    Component Value Date/Time   COLORURINE YELLOW 09/19/2016 1915   APPEARANCEUR CLEAR 09/19/2016 1915   LABSPEC 1.021 09/19/2016 1915   PHURINE 5.0 09/19/2016 1915   GLUCOSEU >=500 (A) 09/19/2016 1915   HGBUR NEGATIVE 09/19/2016 [place] NEGATIVE 09/19/2016 [place] NEGATIVE 09/19/2016 1915   PROTEINUR 30 (A) 09/19/2016 1915   NITRITE NEGATIVE 09/19/2016 1915   LEUKOCYTESUR NEGATIVE 09/19/2016 1915    Discussed in detail with patient's daughter at bedside. Updated care and answered questions.  Time coordinating discharge: Over 30 minutes  SIGNED:  Vernell Leep, MD, FACP, [place]. [facility] Pager [phone]  If 7PM-7AM, please contact night-coverage [email or web address] Password [email or web address] 09/23/2016, 3:25 PM

## 2016-09-23 NOTE — Care Management Note (Signed)
Case Management Note  Patient Details  Name: [name] MRN: [id number] Date of Birth: 24-Jun-1955  Subjective/Objective:                 Patient from home in observation. Plan for discharge today. No home needs identified.    Action/Plan:   Expected Discharge Date:  09/23/16               Expected Discharge Plan:  Home/Self Care  In-House Referral:     Discharge planning Services  CM Consult  Post Acute Care Choice:    Choice offered to:     DME Arranged:    DME Agency:     HH Arranged:    HH Agency:     Status of Service:  Completed, signed off  If discussed at [employer] of Stay Meetings, dates discussed:    Additional Comments:  Carles Collet, RN 09/23/2016, 3:15 PM

## 2016-09-23 NOTE — Plan of Care (Signed)
Problem: Education: Goal: Ability to describe self-care measures that may prevent or decrease complications (Diabetes Survival Skills Education) will improve Outcome: Progressing Pt.'s daughter brought pt.'s Byetta in for MD to see and hopefully start to help decrease BS.

## 2016-09-23 NOTE — Discharge Instructions (Signed)

## 2016-09-23 NOTE — Progress Notes (Addendum)
Inpatient Diabetes Program Recommendations  AACE/ADA: New Consensus Statement on Inpatient Glycemic Control (2015)  Target Ranges:  Prepandial:   less than 140 mg/dL      Peak postprandial:   less than 180 mg/dL (1-2 hours)      Critically ill patients:  140 - 180 mg/dL   Lab Results  Component Value Date   GLUCAP 258 (H) 09/23/2016   HGBA1C 12.2 (H) 09/21/2016    Review of Glycemic Control Results for [name], [name] (MRN [id number]) as of 09/23/2016 09:48  Ref. Range 09/22/2016 07:47 09/22/2016 12:12 09/22/2016 16:54 09/22/2016 21:18 09/23/2016 07:40  Glucose-Capillary Latest Ref Range: 65 - 99 mg/dL 339 (H) 357 (H) 327 (H) 333 (H) 258 (H)   Diabetes history: DM2 Outpatient Diabetes medications: Lantus 60 units bid + Novolog 2-10 units tid meal coverage + Byetta 5 bid Current orders for Inpatient glycemic control: Lantus 60 units bid + Novolog correction 0-20 units tid + 0-5 units hs  Inpatient Diabetes Program Recommendations:  Noted increased Lantus to 60 units bid. Please consider: -Novolog 5 units tid meal coverage if eats 50% meals  2:00 Spoke with pt about A1C results 12.2 (average CBG 303 over the past 2-3 months) with them and explained A1C is, basic pathophysiology of DM Type 2, basic home care, basic diabetes diet nutrition principles, importance of checking CBGs and maintaining good CBG control to prevent long-term and short-term complications. Reviewed signs and symptoms of hyperglycemia and hypoglycemia and how to treat hypoglycemia at home. Also reviewed blood sugar goals at home.  RNs to provide ongoing basic DM education at bedside with this patient.   Thank you, Nani Gasser. Reha Martinovich, RN, MSN, CDE  Diabetes Coordinator Inpatient Glycemic Control Team Team Pager [phone] (8am-5pm) 09/23/2016 9:50 AM

## 2016-09-25 LAB — CULTURE, BLOOD (ROUTINE X 2)
Culture: NO GROWTH
Culture: NO GROWTH
SPECIAL REQUESTS: ADEQUATE
Special Requests: ADEQUATE

## 2016-09-28 ENCOUNTER — Encounter (HOSPITAL_COMMUNITY): Payer: Self-pay | Admitting: Emergency Medicine

## 2016-09-28 ENCOUNTER — Telehealth (HOSPITAL_COMMUNITY): Payer: Self-pay | Admitting: *Deleted

## 2016-09-28 ENCOUNTER — Ambulatory Visit (HOSPITAL_COMMUNITY)
Admission: EM | Admit: 2016-09-28 | Discharge: 2016-09-28 | Disposition: A | Discharge status: Home / Self Care | Payer: Medicare PPO | Attending: Family Medicine | Admitting: Family Medicine

## 2016-09-28 DIAGNOSIS — G4733 Obstructive sleep apnea (adult) (pediatric): Secondary | ICD-10-CM | POA: Insufficient documentation

## 2016-09-28 DIAGNOSIS — R7989 Other specified abnormal findings of blood chemistry: Secondary | ICD-10-CM

## 2016-09-28 DIAGNOSIS — R945 Abnormal results of liver function studies: Secondary | ICD-10-CM

## 2016-09-28 DIAGNOSIS — N183 Chronic kidney disease, stage 3 (moderate): Secondary | ICD-10-CM | POA: Insufficient documentation

## 2016-09-28 DIAGNOSIS — I129 Hypertensive chronic kidney disease with stage 1 through stage 4 chronic kidney disease, or unspecified chronic kidney disease: Secondary | ICD-10-CM | POA: Diagnosis not present

## 2016-09-28 DIAGNOSIS — I2510 Atherosclerotic heart disease of native coronary artery without angina pectoris: Secondary | ICD-10-CM | POA: Diagnosis not present

## 2016-09-28 DIAGNOSIS — R0600 Dyspnea, unspecified: Secondary | ICD-10-CM | POA: Diagnosis not present

## 2016-09-28 DIAGNOSIS — G8929 Other chronic pain: Secondary | ICD-10-CM | POA: Diagnosis not present

## 2016-09-28 DIAGNOSIS — Z8673 Personal history of transient ischemic attack (TIA), and cerebral infarction without residual deficits: Secondary | ICD-10-CM | POA: Diagnosis not present

## 2016-09-28 DIAGNOSIS — K7581 Nonalcoholic steatohepatitis (NASH): Secondary | ICD-10-CM | POA: Diagnosis not present

## 2016-09-28 DIAGNOSIS — E1122 Type 2 diabetes mellitus with diabetic chronic kidney disease: Secondary | ICD-10-CM | POA: Insufficient documentation

## 2016-09-28 DIAGNOSIS — E1151 Type 2 diabetes mellitus with diabetic peripheral angiopathy without gangrene: Secondary | ICD-10-CM | POA: Diagnosis present

## 2016-09-28 DIAGNOSIS — R160 Hepatomegaly, not elsewhere classified: Secondary | ICD-10-CM | POA: Diagnosis not present

## 2016-09-28 DIAGNOSIS — G43909 Migraine, unspecified, not intractable, without status migrainosus: Secondary | ICD-10-CM | POA: Diagnosis not present

## 2016-09-28 DIAGNOSIS — G4489 Other headache syndrome: Secondary | ICD-10-CM

## 2016-09-28 LAB — COMPREHENSIVE METABOLIC PANEL
ALBUMIN: 3.3 g/dL — AB (ref 3.5–5.0)
ALT: 297 U/L — AB (ref 14–54)
AST: 208 U/L — AB (ref 15–41)
Alkaline Phosphatase: 140 U/L — ABNORMAL HIGH (ref 38–126)
Anion gap: 8 (ref 5–15)
BILIRUBIN TOTAL: 0.9 mg/dL (ref 0.3–1.2)
BUN: 38 mg/dL — AB (ref 6–20)
CHLORIDE: 105 mmol/L (ref 101–111)
CO2: 23 mmol/L (ref 22–32)
CREATININE: 1.65 mg/dL — AB (ref 0.44–1.00)
Calcium: 9.2 mg/dL (ref 8.9–10.3)
GFR calc Af Amer: 38 mL/min — ABNORMAL LOW (ref >60)
GFR, EST NON AFRICAN AMERICAN: 33 mL/min — AB (ref >60)
GLUCOSE: 125 mg/dL — AB (ref 65–99)
Potassium: 4.2 mmol/L (ref 3.5–5.1)
SODIUM: 136 mmol/L (ref 135–145)
Total Protein: 6.7 g/dL (ref 6.5–8.1)

## 2016-09-28 LAB — CBC WITH DIFFERENTIAL/PLATELET
Basophils Absolute: 0 10*3/uL (ref 0.0–0.1)
Basophils Relative: 1 %
EOS ABS: 0.3 10*3/uL (ref 0.0–0.7)
EOS PCT: 5 %
HCT: 37.5 % (ref 36.0–46.0)
Hemoglobin: 12 g/dL (ref 12.0–15.0)
LYMPHS ABS: 1.2 10*3/uL (ref 0.7–4.0)
Lymphocytes Relative: 26 %
MCH: 27.8 pg (ref 26.0–34.0)
MCHC: 32 g/dL (ref 30.0–36.0)
MCV: 86.8 fL (ref 78.0–100.0)
MONO ABS: 1 10*3/uL (ref 0.1–1.0)
Monocytes Relative: 21 %
Neutro Abs: 2.2 10*3/uL (ref 1.7–7.7)
Neutrophils Relative %: 47 %
PLATELETS: 156 10*3/uL (ref 150–400)
RBC: 4.32 MIL/uL (ref 3.87–5.11)
RDW: 13.6 % (ref 11.5–15.5)
WBC: 4.6 10*3/uL (ref 4.0–10.5)

## 2016-09-28 MED ORDER — ONDANSETRON HCL 4 MG PO TABS: 4.0000 mg | ORAL_TABLET | Freq: Four times a day (QID) | ORAL | 0 refills | Status: DC

## 2016-09-28 NOTE — ED Triage Notes (Signed)
Discharged from [facility] on Wednesday.  Patient reports abnormal labs.  pcp is in [place].  So, patient came here for labs.

## 2016-09-28 NOTE — Telephone Encounter (Signed)
Spoke with patient and family regarding lab work. Verbalized understanding of results. Will follow up with GI tomorrow.

## 2016-09-28 NOTE — Discharge Instructions (Signed)
Follow-up with the gastroenterologist tomorrow as directed. The lab results should be ready to view in 2-4 hours.

## 2016-09-28 NOTE — ED Provider Notes (Signed)
[place]    CSN: [id number] Arrival date & time: 09/28/16  1059     History   Chief Complaint Chief Complaint  Patient presents with  . Labs Only    HPI [name] is a 61 y.o. female.   42 -year-old female presents to the urgent care with a request of having a CBC, metabolic panel drawn. She was recently hospitalized at [facility] and prior to that [place] revealed Karlene Lineman as well as abnormal LFTs and was to follow with the gastroenterologist tomorrow. She states that she wants to have her blood work today. She has history of CK D, CAD, type 2 diabetes mellitus, OSA, peripheral vascular disease and CVA. She is also complaining of her usual migraine headache. She was told she cannot take NSAIDs, acetaminophen and other medications that she usually takes for her migraine headaches. She is already taking Nucynta which is not helping. She is in a pain clinic. She has seen a neurologist and was treated with narcotics for migraine headache with a no longer do any good. She states this headache is typical of her previous headaches.      Past Medical History:  Diagnosis Date  . Chronic kidney disease   . Chronic pain   . Coronary artery disease   . Diabetes mellitus without complication ([place])   . Dyspnea   . Heart murmur   . Obstructive sleep apnea   . Osteomyelitis ([place])   . Peripheral vascular disease ([place])   . Sleep apnea   . Stroke [facility])     Patient Active Problem List   Diagnosis Date Noted  . Liver mass 09/20/2016  . Kidney disease, chronic, stage III (GFR 30-59 ml/min) 12/17/2015  . Uncontrolled type 2 diabetes mellitus with diabetic polyneuropathy, with long-term current use of insulin ([place]) 07/18/2015  . Essential hypertension 06/06/2015  . Chronic back pain 07/27/2012  . OSA on CPAP 03/21/2012    Past Surgical History:  Procedure Laterality Date  . ABDOMINAL HYSTERECTOMY    . BACK SURGERY    . CHOLECYSTECTOMY    . KNEE SURGERY      . TOE AMPUTATION    . TONSILLECTOMY    . TUBAL LIGATION      OB History    No data available       Home Medications    Prior to Admission medications   Medication Sig Start Date End Date Taking? Authorizing Provider  busPIRone (BUSPAR) 15 MG tablet Take 15 mg by mouth 3 (three) times daily. 08/31/16   [provider]  carvedilol (COREG) 25 MG tablet Take 25 mg by mouth 2 (two) times daily. 08/17/16   [provider]  exenatide (BYETTA 5 MCG PEN) 5 MCG/0.02ML SOPN injection Inject 0.02 mLs into the skin 2 (two) times daily. 08/10/16   [provider]  gabapentin (NEURONTIN) 100 MG capsule Take 300 mg by mouth 3 (three) times daily.    [provider]  hydrochlorothiazide (HYDRODIURIL) 25 MG tablet Take 25 mg by mouth daily.    [provider]  insulin aspart (NOVOLOG) 100 UNIT/ML FlexPen Inject 2-10 Units into the skin See admin instructions. Based on sliding scale. Max 60 units a day. 06/06/15   [provider]  insulin glargine (LANTUS) 100 UNIT/ML injection Inject 60 Units into the skin 2 (two) times daily. 04/08/15   [provider]  ondansetron (ZOFRAN) 4 MG tablet Take 1 tablet (4 mg total) by mouth every 6 (six) hours. 09/28/16  Janne Napoleon, NP  sertraline (ZOLOFT) 50 MG tablet Take 50 mg by mouth daily. 07/02/16   [provider]  tapentadol (NUCYNTA) 50 MG tablet Take 50 mg by mouth 3 (three) times daily.    [provider]    Family History Family History  Problem Relation Age of Onset  . Cancer Mother        Thymoma, metastatic  . Stroke Father   . Multiple sclerosis Sister   . Epilepsy Sister     Social History Social History  Substance Use Topics  . Smoking status: Never Smoker  . Smokeless tobacco: Never Used  . Alcohol use No     Allergies   Vancomycin; Doxycycline; Inderal [propranolol]; Lasix [furosemide]; Norvasc [amlodipine besylate]; Prednisone; and Sulfa antibiotics   Review  of Systems Review of Systems  Constitutional: Negative.   Respiratory: Negative.   Cardiovascular: Negative.   Gastrointestinal: Positive for abdominal pain.  Genitourinary: Negative.   Neurological: Positive for headaches.  All other systems reviewed and are negative.    Physical Exam Triage Vital Signs ED Triage Vitals  Enc Vitals Group     BP 09/28/16 1222 (!) 100/59     Pulse Rate 09/28/16 1222 76     Resp 09/28/16 1222 18     Temp 09/28/16 1222 98 F (36.7 C)     Temp Source 09/28/16 1222 Oral     SpO2 09/28/16 1222 97 %     Weight --      Height --      Head Circumference --      Peak Flow --      Pain Score 09/28/16 1219 9     Pain Loc --      Pain Edu? --      Excl. in [place]? --    No data found.   Updated Vital Signs BP (!) 100/59 (BP Location: Right Arm) Comment (BP Location): large cuff  Pulse 76   Temp 98 F (36.7 C) (Oral)   Resp 18   SpO2 97%   Visual Acuity Right Eye Distance:   Left Eye Distance:   Bilateral Distance:    Right Eye Near:   Left Eye Near:    Bilateral Near:     Physical Exam  Constitutional: She is oriented to person, place, and time. She appears well-developed and well-nourished. No distress.  Eyes: EOM are normal.  Neck: Normal range of motion. Neck supple.  Cardiovascular: Normal rate.   Pulmonary/Chest: Effort normal.  Neurological: She is alert and oriented to person, place, and time.  Skin: Skin is warm and dry.  Psychiatric: She has a normal mood and affect.  Nursing note and vitals reviewed.    UC Treatments / Results  Labs (all labs ordered are listed, but only abnormal results are displayed) Labs Reviewed  COMPREHENSIVE METABOLIC PANEL  CBC WITH DIFFERENTIAL/PLATELET    EKG  EKG Interpretation None       Radiology No results found.  Procedures Procedures (including critical care time)  Medications Ordered in UC Medications - No data to display   Initial Impression / Assessment and Plan / UC  Course  I have reviewed the triage vital signs and the nursing notes.  Pertinent labs & imaging results that were available during my care of the patient were reviewed by me and considered in my medical decision making (see chart for details).     Follow-up with the gastroenterologist tomorrow as directed. The lab results should be ready to  view in 2-4 hours.   Final Clinical Impressions(s) / UC Diagnoses   Final diagnoses:  Abnormal LFTs  Nonalcoholic steatohepatitis (NASH)  Other headache syndrome    New Prescriptions New Prescriptions   ONDANSETRON (ZOFRAN) 4 MG TABLET    Take 1 tablet (4 mg total) by mouth every 6 (six) hours.     Controlled Substance Prescriptions [place] Controlled Substance Registry consulted? Not Applicable   Janne Napoleon, NP 09/28/16 1301

## 2016-09-30 ENCOUNTER — Emergency Department (HOSPITAL_COMMUNITY): Payer: Medicare PPO

## 2016-09-30 ENCOUNTER — Emergency Department (HOSPITAL_COMMUNITY)
Admission: EM | Admit: 2016-09-30 | Discharge: 2016-09-30 | Disposition: A | Discharge status: Home / Self Care | Payer: Medicare PPO | Attending: Emergency Medicine | Admitting: Emergency Medicine

## 2016-09-30 ENCOUNTER — Encounter (HOSPITAL_COMMUNITY): Payer: Self-pay | Admitting: *Deleted

## 2016-09-30 ENCOUNTER — Other Ambulatory Visit: Payer: Self-pay

## 2016-09-30 DIAGNOSIS — Z8673 Personal history of transient ischemic attack (TIA), and cerebral infarction without residual deficits: Secondary | ICD-10-CM | POA: Diagnosis not present

## 2016-09-30 DIAGNOSIS — N183 Chronic kidney disease, stage 3 unspecified: Secondary | ICD-10-CM

## 2016-09-30 DIAGNOSIS — Z794 Long term (current) use of insulin: Secondary | ICD-10-CM | POA: Insufficient documentation

## 2016-09-30 DIAGNOSIS — I2510 Atherosclerotic heart disease of native coronary artery without angina pectoris: Secondary | ICD-10-CM | POA: Insufficient documentation

## 2016-09-30 DIAGNOSIS — I129 Hypertensive chronic kidney disease with stage 1 through stage 4 chronic kidney disease, or unspecified chronic kidney disease: Secondary | ICD-10-CM | POA: Diagnosis not present

## 2016-09-30 DIAGNOSIS — E119 Type 2 diabetes mellitus without complications: Secondary | ICD-10-CM | POA: Insufficient documentation

## 2016-09-30 DIAGNOSIS — R079 Chest pain, unspecified: Secondary | ICD-10-CM

## 2016-09-30 DIAGNOSIS — R0602 Shortness of breath: Secondary | ICD-10-CM | POA: Diagnosis not present

## 2016-09-30 DIAGNOSIS — Z79899 Other long term (current) drug therapy: Secondary | ICD-10-CM | POA: Diagnosis not present

## 2016-09-30 LAB — CBC
HCT: 35 % — ABNORMAL LOW (ref 36.0–46.0)
HEMOGLOBIN: 11.4 g/dL — AB (ref 12.0–15.0)
MCH: 28.4 pg (ref 26.0–34.0)
MCHC: 32.6 g/dL (ref 30.0–36.0)
MCV: 87.1 fL (ref 78.0–100.0)
Platelets: 144 10*3/uL — ABNORMAL LOW (ref 150–400)
RBC: 4.02 MIL/uL (ref 3.87–5.11)
RDW: 13.6 % (ref 11.5–15.5)
WBC: 4.4 10*3/uL (ref 4.0–10.5)

## 2016-09-30 LAB — BASIC METABOLIC PANEL
ANION GAP: 8 (ref 5–15)
BUN: 31 mg/dL — ABNORMAL HIGH (ref 6–20)
CALCIUM: 9 mg/dL (ref 8.9–10.3)
CO2: 20 mmol/L — AB (ref 22–32)
Chloride: 104 mmol/L (ref 101–111)
Creatinine, Ser: 1.62 mg/dL — ABNORMAL HIGH (ref 0.44–1.00)
GFR, EST AFRICAN AMERICAN: 39 mL/min — AB (ref >60)
GFR, EST NON AFRICAN AMERICAN: 33 mL/min — AB (ref >60)
Glucose, Bld: 323 mg/dL — ABNORMAL HIGH (ref 65–99)
Potassium: 4.5 mmol/L (ref 3.5–5.1)
Sodium: 132 mmol/L — ABNORMAL LOW (ref 135–145)

## 2016-09-30 LAB — HEPATIC FUNCTION PANEL
ALBUMIN: 3.1 g/dL — AB (ref 3.5–5.0)
ALK PHOS: 137 U/L — AB (ref 38–126)
ALT: 236 U/L — ABNORMAL HIGH (ref 14–54)
AST: 129 U/L — ABNORMAL HIGH (ref 15–41)
BILIRUBIN INDIRECT: 0.6 mg/dL (ref 0.3–0.9)
Bilirubin, Direct: 0.2 mg/dL (ref 0.1–0.5)
Total Bilirubin: 0.8 mg/dL (ref 0.3–1.2)
Total Protein: 6.3 g/dL — ABNORMAL LOW (ref 6.5–8.1)

## 2016-09-30 LAB — I-STAT TROPONIN, ED: TROPONIN I, POC: 0.01 ng/mL (ref 0.00–0.08)

## 2016-09-30 LAB — TROPONIN I

## 2016-09-30 MED ORDER — MORPHINE SULFATE (PF) 4 MG/ML IV SOLN
4.0000 mg | Freq: Once | INTRAVENOUS | Status: AC
  Administered 2016-09-30: 4 mg via INTRAVENOUS
  Filled 2016-09-30: qty 1

## 2016-09-30 MED ORDER — GI COCKTAIL ~~LOC~~
30.0000 mL | Freq: Once | ORAL | Status: AC
  Administered 2016-09-30: 30 mL via ORAL
  Filled 2016-09-30: qty 30

## 2016-09-30 MED ORDER — SODIUM CHLORIDE 0.9 % IV BOLUS (SEPSIS)
200.0000 mL | Freq: Once | INTRAVENOUS | Status: AC
  Administered 2016-09-30: 200 mL via INTRAVENOUS

## 2016-09-30 NOTE — ED Notes (Signed)
Pt c.o increased pain, EDP aware, no need orders

## 2016-09-30 NOTE — ED Triage Notes (Signed)
Pt reports going to ucc on 8/27 and was told that her liver enzymes and glucose are elevated. Today is having chest pain that radiates into her neck and sob. No acute resp distress noted at triage and ekg done.

## 2016-09-30 NOTE — Discharge Instructions (Signed)
Follow-up with your primary doctor tomorrow. Your workup here was very reassuring, no evidence of acute cardiac process, no signs of infection on your blood work or chest x-ray, her kidney function is at its baseline and your liver enzymes are stable. As we discussed to certainly have a lot of chronic issues you're dealing with and this can be challenging. I recommend discussing with your primary doctor being referred to an endocrinologist, continue to follow up with GI doctor regarding liver enzymes. Return to the ED if new or concerning symptoms develop, or if chest pain worsens.

## 2016-09-30 NOTE — ED Provider Notes (Signed)
[facility] DEPT Provider Note   CSN: [id number] Arrival date & time: 09/30/16  1525     History   Chief Complaint Chief Complaint  Patient presents with  . Chest Pain  . Shortness of Breath  . Abnormal Lab    HPI  [name] is a 61 year old female with a history of poorly controlled type 2 diabetes, stage III CKD, CVA and newly diagnosed liver dysfunction, who presents today for evaluation of chest pain and shortness of breath. Patient reports she has been feeling generally weak and short of breath for the past week, shortness of breath is worsened with activity. Patient reports chest pain started today and is still with occasional sharp pains, she reports this pain radiates to her neck. She denies history of previous MI, patient had a heart cath in 2016 which she reports was normal. Had echo done on 8/20 during recent hospital admission which showed good LV function and normal EF. Patient reports she feels weak and unsteady but denies any recent falls, patient does have severe neuropathy of bilateral lower extremities.  Patient was discharged from the hospital on 8/22 after a workup for vomiting and weakness. At that time she had elevated liver enzymes and irregular lesions on liver ultrasound. She was seen by Dr. Collene Mares with GI yesterday.      Past Medical History:  Diagnosis Date  . Chronic kidney disease   . Chronic pain   . Coronary artery disease   . Diabetes mellitus without complication ([place])   . Dyspnea   . Heart murmur   . Obstructive sleep apnea   . Osteomyelitis ([place])   . Peripheral vascular disease ([place])   . Sleep apnea   . Stroke [facility])     Patient Active Problem List   Diagnosis Date Noted  . Liver mass 09/20/2016  . Kidney disease, chronic, stage III (GFR 30-59 ml/min) 12/17/2015  . Uncontrolled type 2 diabetes mellitus with diabetic polyneuropathy, with long-term current use of insulin ([place]) 07/18/2015  . Essential hypertension 06/06/2015  .  Chronic back pain 07/27/2012  . OSA on CPAP 03/21/2012    Past Surgical History:  Procedure Laterality Date  . ABDOMINAL HYSTERECTOMY    . BACK SURGERY    . CHOLECYSTECTOMY    . KNEE SURGERY    . TOE AMPUTATION    . TONSILLECTOMY    . TUBAL LIGATION      OB History    No data available       Home Medications    Prior to Admission medications   Medication Sig Start Date End Date Taking? Authorizing Provider  busPIRone (BUSPAR) 15 MG tablet Take 15 mg by mouth 3 (three) times daily. 08/31/16   [provider]  carvedilol (COREG) 25 MG tablet Take 25 mg by mouth 2 (two) times daily. 08/17/16   [provider]  exenatide (BYETTA 5 MCG PEN) 5 MCG/0.02ML SOPN injection Inject 0.02 mLs into the skin 2 (two) times daily. 08/10/16   [provider]  gabapentin (NEURONTIN) 100 MG capsule Take 300 mg by mouth 3 (three) times daily.    [provider]  hydrochlorothiazide (HYDRODIURIL) 25 MG tablet Take 25 mg by mouth daily.    [provider]  insulin aspart (NOVOLOG) 100 UNIT/ML FlexPen Inject 2-10 Units into the skin See admin instructions. Based on sliding scale. Max 60 units a day. 06/06/15   [provider]  insulin glargine (LANTUS) 100 UNIT/ML injection Inject 60 Units into the skin 2 (two)  times daily. 04/08/15   [provider]  ondansetron (ZOFRAN) 4 MG tablet Take 1 tablet (4 mg total) by mouth every 6 (six) hours. 09/28/16   Janne Napoleon, NP  sertraline (ZOLOFT) 50 MG tablet Take 50 mg by mouth daily. 07/02/16   [provider]  tapentadol (NUCYNTA) 50 MG tablet Take 50 mg by mouth 3 (three) times daily.    [provider]    Family History Family History  Problem Relation Age of Onset  . Cancer Mother        Thymoma, metastatic  . Stroke Father   . Multiple sclerosis Sister   . Epilepsy Sister     Social History Social History  Substance Use Topics  . Smoking status: Never Smoker  . Smokeless  tobacco: Never Used  . Alcohol use No     Allergies   Vancomycin; Doxycycline; Inderal [propranolol]; Lasix [furosemide]; Norvasc [amlodipine besylate]; Prednisone; and Sulfa antibiotics   Review of Systems Review of Systems  Constitutional: Negative for chills and fever.  HENT: Negative for ear pain, rhinorrhea and sore throat.   Eyes: Negative for photophobia and visual disturbance.  Respiratory: Positive for chest tightness and shortness of breath. Negative for cough and wheezing.   Cardiovascular: Positive for chest pain. Negative for palpitations and leg swelling.  Gastrointestinal: Positive for abdominal pain and nausea. Negative for constipation, diarrhea and vomiting.       Right upper quadrant pain  Genitourinary: Negative for difficulty urinating and dyspareunia.  Musculoskeletal: Positive for back pain.       Chronic back pain, has nerve stimulator  Skin: Negative for color change, rash and wound.  Neurological: Positive for headaches. Negative for dizziness, syncope, weakness, light-headedness and numbness.     Physical Exam Updated Vital Signs BP (!) 96/57   Pulse 70   Temp 98.6 F (37 C) (Oral)   Resp 16   SpO2 99%   Physical Exam  Constitutional: She appears well-developed and well-nourished. No distress.  HENT:  Head: Normocephalic and atraumatic.  Mouth/Throat: Oropharynx is clear and moist.  Eyes: Pupils are equal, round, and reactive to light. EOM are normal. Right eye exhibits no discharge. Left eye exhibits no discharge.  Cardiovascular: Normal rate, regular rhythm, normal heart sounds and intact distal pulses.   Pulmonary/Chest: Effort normal and breath sounds normal. No respiratory distress. She exhibits tenderness.  Chest tender to palpation on left side, where patient describes pain.  Abdominal: Soft. Bowel sounds are normal. She exhibits no distension. There is tenderness. There is no guarding.  Right upper quadrant tenderness, all other  quadrants nontender to palpation  Musculoskeletal: She exhibits no edema or deformity.  Neurological: She is alert. Coordination normal.  Speech is clear, able to follow commands CN III-XII intact Normal strength in upper and lower extremities bilaterally including dorsiflexion and plantar flexion, strong and equal grip strength Sensation normal to light and sharp touch Moves extremities without ataxia, coordination intact   Skin: Skin is warm and dry. Capillary refill takes less than 2 seconds. She is not diaphoretic.  Psychiatric: She has a normal mood and affect. Her behavior is normal.  Nursing note and vitals reviewed.    ED Treatments / Results  Labs (all labs ordered are listed, but only abnormal results are displayed) Labs Reviewed  BASIC METABOLIC PANEL - Abnormal; Notable for the following:       Result Value   Sodium 132 (*)    CO2 20 (*)    Glucose, Bld 323 (*)  BUN 31 (*)    Creatinine, Ser 1.62 (*)    GFR calc non Af Amer 33 (*)    GFR calc Af Amer 39 (*)    All other components within normal limits  CBC - Abnormal; Notable for the following:    Hemoglobin 11.4 (*)    HCT 35.0 (*)    Platelets 144 (*)    All other components within normal limits  HEPATIC FUNCTION PANEL - Abnormal; Notable for the following:    Total Protein 6.3 (*)    Albumin 3.1 (*)    AST 129 (*)    ALT 236 (*)    Alkaline Phosphatase 137 (*)    All other components within normal limits  TROPONIN I  I-STAT TROPONIN, ED    EKG  EKG Interpretation  Date/Time:  Wednesday September 30 2016 15:38:20 EDT Ventricular Rate:  72 PR Interval:  218 QRS Duration: 142 QT Interval:  450 QTC Calculation: 492 R Axis:   -57 Text Interpretation:  Sinus rhythm with 1st degree A-V block Right bundle branch block Left anterior fascicular block Abnormal ekg Confirmed by Carmin Muskrat [phone]) on 09/30/2016 5:21:35 PM       Radiology Dg Chest 2 View  Result Date: 09/30/2016 CLINICAL DATA:  Chest  pain, shortness of breath. EXAM: CHEST  2 VIEW COMPARISON:  None. FINDINGS: The heart size and mediastinal contours are within normal limits. Both lungs are clear. The visualized skeletal structures are unremarkable. Thoracic spinal cord stimulator. IMPRESSION: No active cardiopulmonary disease. Electronically Signed   By: Titus Dubin M.D.   On: 09/30/2016 16:21    Procedures Procedures (including critical care time)  Medications Ordered in ED Medications - No data to display   Initial Impression / Assessment and Plan / ED Course  I have reviewed the triage vital signs and the nursing notes.  Pertinent labs & imaging results that were available during my care of the patient were reviewed by me and considered in my medical decision making (see chart for details).  Patient presenting with chest pain radiating to the neck and exertional shortness of breath, as well as generalized weakness. Will rule out ACS. Patient with mild hypotension will provide IV fluids.  CBC and BMP are unremarkable, kidney function is at baseline. EKG unremarkable and troponin negative. CXR shows no active cardiopulmonary disease. ACS is unlikely. PE unlikely, patient is not tachycardic or hypoxic, has no history of PE, denies recent travel, she was recently hospitalized but was given Lovenox for DVT prophylaxis. No evidence of infection. LFTs are stable in comparison to labs from 8/27, followed by GI.  On re-eval BP has improved with fluids, CP is still present. Will repeat EKG and Troponin. Morphine and GI cocktail for pain. Repeat trop is negative, patient reports some improvement in pain with meds.   No acute findings to explain chest pain. Had long discussion with patient and daughter about results of workup. Will discharge patient home. She has follow up appointments with PCP and pain management tomorrow morning. Encouraged patient to discuss seeing an endocrinologist for better diabetes management with PCP  tomorrow. Return precautions provided. Patient and daughter express understanding.    Final Clinical Impressions(s) / ED Diagnoses   Final diagnoses:  Chest pain, unspecified type  Stage 3 chronic kidney disease  Shortness of breath    New Prescriptions Discharge Medication List as of 09/30/2016 10:13 PM       Jacqlyn Larsen, PA-C 10/01/16 0024    Carmin Muskrat,  MD 10/06/16 0020

## 2016-10-28 ENCOUNTER — Emergency Department (HOSPITAL_COMMUNITY)
Admission: EM | Admit: 2016-10-28 | Discharge: 2016-10-29 | Disposition: A | Discharge status: Home / Self Care | Payer: Medicare PPO | Attending: Emergency Medicine | Admitting: Emergency Medicine

## 2016-10-28 ENCOUNTER — Encounter (HOSPITAL_COMMUNITY): Payer: Self-pay | Admitting: Emergency Medicine

## 2016-10-28 DIAGNOSIS — Z794 Long term (current) use of insulin: Secondary | ICD-10-CM | POA: Diagnosis not present

## 2016-10-28 DIAGNOSIS — N183 Chronic kidney disease, stage 3 (moderate): Secondary | ICD-10-CM | POA: Insufficient documentation

## 2016-10-28 DIAGNOSIS — Z79899 Other long term (current) drug therapy: Secondary | ICD-10-CM | POA: Insufficient documentation

## 2016-10-28 DIAGNOSIS — G43009 Migraine without aura, not intractable, without status migrainosus: Secondary | ICD-10-CM | POA: Diagnosis present

## 2016-10-28 DIAGNOSIS — N390 Urinary tract infection, site not specified: Secondary | ICD-10-CM | POA: Insufficient documentation

## 2016-10-28 DIAGNOSIS — I129 Hypertensive chronic kidney disease with stage 1 through stage 4 chronic kidney disease, or unspecified chronic kidney disease: Secondary | ICD-10-CM | POA: Diagnosis not present

## 2016-10-28 DIAGNOSIS — E1122 Type 2 diabetes mellitus with diabetic chronic kidney disease: Secondary | ICD-10-CM | POA: Diagnosis not present

## 2016-10-28 DIAGNOSIS — R1010 Upper abdominal pain, unspecified: Secondary | ICD-10-CM | POA: Insufficient documentation

## 2016-10-28 DIAGNOSIS — Z8673 Personal history of transient ischemic attack (TIA), and cerebral infarction without residual deficits: Secondary | ICD-10-CM | POA: Diagnosis not present

## 2016-10-28 DIAGNOSIS — I2510 Atherosclerotic heart disease of native coronary artery without angina pectoris: Secondary | ICD-10-CM | POA: Diagnosis not present

## 2016-10-28 HISTORY — DX: Liver disease, unspecified: K76.9

## 2016-10-28 LAB — URINALYSIS, ROUTINE W REFLEX MICROSCOPIC
Bilirubin Urine: NEGATIVE
Hgb urine dipstick: NEGATIVE
Ketones, ur: NEGATIVE mg/dL
NITRITE: POSITIVE — AB
PROTEIN: 100 mg/dL — AB
Specific Gravity, Urine: 1.022 (ref 1.005–1.030)
pH: 5 (ref 5.0–8.0)

## 2016-10-28 LAB — LIPASE, BLOOD: LIPASE: 38 U/L (ref 11–51)

## 2016-10-28 LAB — CBC WITH DIFFERENTIAL/PLATELET
Basophils Absolute: 0 10*3/uL (ref 0.0–0.1)
Basophils Relative: 0 %
Eosinophils Absolute: 0 10*3/uL (ref 0.0–0.7)
Eosinophils Relative: 1 %
HEMATOCRIT: 38.6 % (ref 36.0–46.0)
HEMOGLOBIN: 12.7 g/dL (ref 12.0–15.0)
LYMPHS ABS: 1.2 10*3/uL (ref 0.7–4.0)
LYMPHS PCT: 30 %
MCH: 27.6 pg (ref 26.0–34.0)
MCHC: 32.9 g/dL (ref 30.0–36.0)
MCV: 83.9 fL (ref 78.0–100.0)
MONOS PCT: 12 %
Monocytes Absolute: 0.5 10*3/uL (ref 0.1–1.0)
NEUTROS ABS: 2.3 10*3/uL (ref 1.7–7.7)
NEUTROS PCT: 57 %
Platelets: 155 10*3/uL (ref 150–400)
RBC: 4.6 MIL/uL (ref 3.87–5.11)
RDW: 13 % (ref 11.5–15.5)
WBC: 4 10*3/uL (ref 4.0–10.5)

## 2016-10-28 LAB — CBG MONITORING, ED
Glucose-Capillary: 398 mg/dL — ABNORMAL HIGH (ref 65–99)
Glucose-Capillary: 322 mg/dL — ABNORMAL HIGH (ref 65–99)

## 2016-10-28 LAB — COMPREHENSIVE METABOLIC PANEL
ALK PHOS: 97 U/L (ref 38–126)
ALT: 43 U/L (ref 14–54)
AST: 38 U/L (ref 15–41)
Albumin: 3.2 g/dL — ABNORMAL LOW (ref 3.5–5.0)
Anion gap: 12 (ref 5–15)
BILIRUBIN TOTAL: 0.7 mg/dL (ref 0.3–1.2)
BUN: 20 mg/dL (ref 6–20)
CALCIUM: 8.4 mg/dL — AB (ref 8.9–10.3)
CO2: 25 mmol/L (ref 22–32)
CREATININE: 1.6 mg/dL — AB (ref 0.44–1.00)
Chloride: 97 mmol/L — ABNORMAL LOW (ref 101–111)
GFR calc non Af Amer: 34 mL/min — ABNORMAL LOW (ref >60)
GFR, EST AFRICAN AMERICAN: 39 mL/min — AB (ref >60)
Glucose, Bld: 356 mg/dL — ABNORMAL HIGH (ref 65–99)
Potassium: 3.3 mmol/L — ABNORMAL LOW (ref 3.5–5.1)
SODIUM: 134 mmol/L — AB (ref 135–145)
TOTAL PROTEIN: 6.6 g/dL (ref 6.5–8.1)

## 2016-10-28 MED ORDER — DEXTROSE 5 % IV SOLN
1.0000 g | Freq: Once | INTRAVENOUS | Status: AC
  Administered 2016-10-28: 1 g via INTRAVENOUS
  Filled 2016-10-28: qty 10

## 2016-10-28 MED ORDER — SODIUM CHLORIDE 0.9 % IV BOLUS (SEPSIS)
1000.0000 mL | Freq: Once | INTRAVENOUS | Status: AC
  Administered 2016-10-28: 1000 mL via INTRAVENOUS

## 2016-10-28 MED ORDER — DIPHENHYDRAMINE HCL 50 MG/ML IJ SOLN
25.0000 mg | Freq: Once | INTRAMUSCULAR | Status: AC
  Administered 2016-10-29: 25 mg via INTRAVENOUS
  Filled 2016-10-28: qty 1

## 2016-10-28 MED ORDER — PROCHLORPERAZINE EDISYLATE 5 MG/ML IJ SOLN
10.0000 mg | Freq: Once | INTRAMUSCULAR | Status: AC
  Administered 2016-10-29: 10 mg via INTRAVENOUS
  Filled 2016-10-28: qty 2

## 2016-10-28 NOTE — ED Triage Notes (Signed)
Patient reports elevated bloood sugar at home today 600+ , she took her Novolog 20 units , CBG= 322 at triage , pt. added migraine headache x1 month and RUQ pain . Denies emesis or diarrhea .

## 2016-10-29 ENCOUNTER — Other Ambulatory Visit: Payer: Self-pay

## 2016-10-29 DIAGNOSIS — G43009 Migraine without aura, not intractable, without status migrainosus: Secondary | ICD-10-CM | POA: Diagnosis not present

## 2016-10-29 LAB — CBG MONITORING, ED: GLUCOSE-CAPILLARY: 257 mg/dL — AB (ref 65–99)

## 2016-10-29 MED ORDER — CEPHALEXIN 500 MG PO CAPS: 1000.0000 mg | ORAL_CAPSULE | Freq: Two times a day (BID) | ORAL | 0 refills | Status: DC

## 2016-10-29 MED ORDER — BUTALBITAL-APAP-CAFFEINE 50-325-40 MG PO TABS: 1.0000 | ORAL_TABLET | Freq: Four times a day (QID) | ORAL | 0 refills | Status: DC | PRN

## 2016-10-29 NOTE — Discharge Instructions (Signed)
Follow-up with your primary doctor.  Return here as needed.

## 2016-10-29 NOTE — ED Provider Notes (Signed)
[facility] DEPT Provider Note   CSN: [id number] Arrival date & time: 10/28/16  1715     History   Chief Complaint Chief Complaint  Patient presents with  . Hyperglycemia  . Migraine    HPI [name] is a 61 y.o. female.  HPI Patient presents to the emergency department with elevated blood sugars, along with headache.  Patient states she has upper abdominal pain that has been fairly chronic, but seemed a little bit worse today.  Patient, states she has been feeling more lethargic over the last few days as well. The patient denies chest pain, shortness of breath,blurred vision, neck pain, fever, cough, weakness, numbness, dizziness, anorexia, edema, abdominal pain, nausea, vomiting, diarrhea, rash, back pain, dysuria, hematemesis, bloody stool, near syncope, or syncope. Past Medical History:  Diagnosis Date  . Chronic kidney disease   . Chronic pain   . Coronary artery disease   . Diabetes mellitus without complication ([place])   . Dyspnea   . Heart murmur   . Liver disease   . Obstructive sleep apnea   . Osteomyelitis ([place])   . Peripheral vascular disease ([place])   . Sleep apnea   . Stroke [facility])     Patient Active Problem List   Diagnosis Date Noted  . Liver mass 09/20/2016  . Kidney disease, chronic, stage III (GFR 30-59 ml/min) 12/17/2015  . Uncontrolled type 2 diabetes mellitus with diabetic polyneuropathy, with long-term current use of insulin ([place]) 07/18/2015  . Essential hypertension 06/06/2015  . Chronic back pain 07/27/2012  . OSA on CPAP 03/21/2012    Past Surgical History:  Procedure Laterality Date  . ABDOMINAL HYSTERECTOMY    . BACK SURGERY    . CHOLECYSTECTOMY    . KNEE SURGERY    . TOE AMPUTATION    . TONSILLECTOMY    . TUBAL LIGATION      OB History    No data available       Home Medications    Prior to Admission medications   Medication Sig Start Date End Date Taking? Authorizing Provider  busPIRone (BUSPAR) 15 MG tablet Take 15  mg by mouth 3 (three) times daily. 08/31/16   [provider]  carvedilol (COREG) 25 MG tablet Take 25 mg by mouth 2 (two) times daily. 08/17/16   [provider]  exenatide (BYETTA 5 MCG PEN) 5 MCG/0.02ML SOPN injection Inject 0.02 mLs into the skin 2 (two) times daily. 08/10/16   [provider]  gabapentin (NEURONTIN) 100 MG capsule Take 300 mg by mouth 3 (three) times daily.    [provider]  hydrochlorothiazide (HYDRODIURIL) 25 MG tablet Take 25 mg by mouth daily.    [provider]  insulin aspart (NOVOLOG) 100 UNIT/ML FlexPen Inject 2-10 Units into the skin See admin instructions. Based on sliding scale. Max 60 units a day. 06/06/15   [provider]  insulin glargine (LANTUS) 100 UNIT/ML injection Inject 60 Units into the skin 2 (two) times daily. 04/08/15   [provider]  ondansetron (ZOFRAN) 4 MG tablet Take 1 tablet (4 mg total) by mouth every 6 (six) hours. 09/28/16   Janne Napoleon, NP  sertraline (ZOLOFT) 50 MG tablet Take 50 mg by mouth daily. 07/02/16   [provider]  tapentadol (NUCYNTA) 50 MG tablet Take 50 mg by mouth 3 (three) times daily.    [provider]  tiZANidine (ZANAFLEX) 4 MG tablet Take 4 mg by mouth 3 (three) times daily.    [provider]    Family History Family History  Problem Relation Age of Onset  . Cancer Mother        Thymoma, metastatic  . Stroke Father   . Multiple sclerosis Sister   . Epilepsy Sister     Social History Social History  Substance Use Topics  . Smoking status: Never Smoker  . Smokeless tobacco: Never Used  . Alcohol use No     Allergies   Vancomycin; Doxycycline; Inderal [propranolol]; Lasix [furosemide]; Norvasc [amlodipine besylate]; Prednisone; and Sulfa antibiotics   Review of Systems Review of Systems All other systems negative except as documented in the HPI. All pertinent positives and negatives as reviewed in the HPI.  Physical  Exam Updated Vital Signs BP 136/87   Pulse 84   Temp (!) 97.5 F (36.4 C) (Oral)   Resp 17   Ht 5' 6"  (1.676 m)   Wt 106.6 kg (235 lb)   SpO2 97%   BMI 37.93 kg/m   Physical Exam  Constitutional: She is oriented to person, place, and time. She appears well-developed and well-nourished. No distress.  HENT:  Head: Normocephalic and atraumatic.  Mouth/Throat: Oropharynx is clear and moist.  Eyes: Pupils are equal, round, and reactive to light.  Neck: Normal range of motion. Neck supple.  Cardiovascular: Normal rate, regular rhythm and normal heart sounds.  Exam reveals no gallop and no friction rub.   No murmur heard. Pulmonary/Chest: Effort normal and breath sounds normal. No respiratory distress. She has no wheezes.  Abdominal: Soft. Bowel sounds are normal. She exhibits no distension and no mass. There is tenderness. There is no rebound and no guarding.  Neurological: She is alert and oriented to person, place, and time. She exhibits normal muscle tone. Coordination normal.  Skin: Skin is warm and dry. Capillary refill takes less than 2 seconds. No rash noted. No erythema.  Psychiatric: She has a normal mood and affect. Her behavior is normal.  Nursing note and vitals reviewed.    ED Treatments / Results  Labs (all labs ordered are listed, but only abnormal results are displayed) Labs Reviewed  COMPREHENSIVE METABOLIC PANEL - Abnormal; Notable for the following:       Result Value   Sodium 134 (*)    Potassium 3.3 (*)    Chloride 97 (*)    Glucose, Bld 356 (*)    Creatinine, Ser 1.60 (*)    Calcium 8.4 (*)    Albumin 3.2 (*)    GFR calc non Af Amer 34 (*)    GFR calc Af Amer 39 (*)    All other components within normal limits  URINALYSIS, ROUTINE W REFLEX MICROSCOPIC - Abnormal; Notable for the following:    APPearance HAZY (*)    Glucose, UA >=500 (*)    Protein, ur 100 (*)    Nitrite POSITIVE (*)    Leukocytes, UA SMALL (*)    Bacteria, UA FEW (*)    Squamous  Epithelial / LPF 0-5 (*)    All other components within normal limits  CBG MONITORING, ED - Abnormal; Notable for the following:    Glucose-Capillary 398 (*)    All other components within normal limits  CBG MONITORING, ED - Abnormal; Notable for the following:    Glucose-Capillary 322 (*)    All other components within normal limits  CBG MONITORING, ED - Abnormal; Notable for the following:    Glucose-Capillary 257 (*)    All other components within normal limits  CBC WITH  DIFFERENTIAL/PLATELET  LIPASE, BLOOD    EKG  EKG Interpretation  Date/Time:  Thursday October 29 2016 00:05:53 EDT Ventricular Rate:  80 PR Interval:    QRS Duration: 157 QT Interval:  465 QTC Calculation: 537 R Axis:   -60 Text Interpretation:  Sinus rhythm Prolonged PR interval RBBB and LAFB No significant change since last tracing Confirmed by Ripley Fraise [phone]) on 10/29/2016 12:28:58 AM       Radiology No results found.  Procedures Procedures (including critical care time)  Medications Ordered in ED Medications  sodium chloride 0.9 % bolus 1,000 mL (1,000 mLs Intravenous New Bag/Given 10/28/16 2321)  cefTRIAXone (ROCEPHIN) 1 g in dextrose 5 % 50 mL IVPB (0 g Intravenous Stopped 10/29/16 0100)  prochlorperazine (COMPAZINE) injection 10 mg (10 mg Intravenous Given 10/29/16 0039)  diphenhydrAMINE (BENADRYL) injection 25 mg (25 mg Intravenous Given 10/29/16 0039)     Initial Impression / Assessment and Plan / ED Course  I have reviewed the triage vital signs and the nursing notes.  Pertinent labs & imaging results that were available during my care of the patient were reviewed by me and considered in my medical decision making (see chart for details).    Patient be treated for urinary tract infection and a field.  The patient follow-up with the primary doctor, told to return here as needed.  Patient is been stable in she has been able to eat and drink without difficulties.  I feel the patient  is feeling this weakness due to the urinary tract infection along with her blood sugars.  Her blood sugar is down to 257.  She is feeling some better.  Her abdominal pain appears to be chronic she has had imaging over the last month for this  Final Clinical Impressions(s) / ED Diagnoses   Final diagnoses:  None    New Prescriptions New Prescriptions   No medications on file     Dalia Heading, Hershal Coria 10/29/16 0126    Ripley Fraise, MD 10/29/16 [phone]

## 2016-10-29 NOTE — ED Notes (Signed)
Received error message when attempting to d/c pt from disposition tab on computer. Patient was given d/c papers, and verbalized understanding of teaching.

## 2016-10-31 LAB — URINE CULTURE: Culture: >=100000 — AB

## 2016-11-01 ENCOUNTER — Telehealth: Payer: Self-pay

## 2016-11-01 NOTE — Telephone Encounter (Signed)
Post ED Visit - Positive Culture Follow-up  Culture report reviewed by antimicrobial stewardship pharmacist:  []  Elenor Quinones, Pharm.D. []  Heide Guile, Pharm.D., BCPS AQ-ID [x]  Parks Neptune, Pharm.D., BCPS []  Alycia Rossetti, Pharm.D., BCPS []  [place], [place].D., BCPS, AAHIVP []  Legrand Como, Pharm.D., BCPS, AAHIVP []  Salome Arnt, PharmD, BCPS []  Dimitri Ped, PharmD, BCPS []  Vincenza Hews, PharmD, BCPS  Positive urine culture Treated with Cephalexin, organism sensitive to the same and no further patient follow-up is required at this time.  Genia Del 11/01/2016, 10:25 AM

## 2016-11-07 DIAGNOSIS — L97519 Non-pressure chronic ulcer of other part of right foot with unspecified severity: Secondary | ICD-10-CM

## 2016-11-07 HISTORY — DX: Non-pressure chronic ulcer of other part of right foot with unspecified severity: L97.519

## 2016-11-23 ENCOUNTER — Ambulatory Visit (INDEPENDENT_AMBULATORY_CARE_PROVIDER_SITE_OTHER): Payer: Medicare PPO | Admitting: Family Medicine

## 2016-11-23 ENCOUNTER — Encounter (HOSPITAL_COMMUNITY): Payer: Self-pay | Admitting: Emergency Medicine

## 2016-11-23 ENCOUNTER — Encounter: Payer: Self-pay | Admitting: Family Medicine

## 2016-11-23 VITALS — BP 122/56 | HR 99 | Resp 16 | Ht 66.0 in | Wt 232.4 lb

## 2016-11-23 DIAGNOSIS — Z23 Encounter for immunization: Secondary | ICD-10-CM

## 2016-11-23 DIAGNOSIS — E1159 Type 2 diabetes mellitus with other circulatory complications: Secondary | ICD-10-CM

## 2016-11-23 DIAGNOSIS — E1142 Type 2 diabetes mellitus with diabetic polyneuropathy: Secondary | ICD-10-CM

## 2016-11-23 DIAGNOSIS — I209 Angina pectoris, unspecified: Secondary | ICD-10-CM | POA: Diagnosis not present

## 2016-11-23 DIAGNOSIS — R160 Hepatomegaly, not elsewhere classified: Secondary | ICD-10-CM | POA: Diagnosis not present

## 2016-11-23 DIAGNOSIS — M549 Dorsalgia, unspecified: Secondary | ICD-10-CM

## 2016-11-23 DIAGNOSIS — L97512 Non-pressure chronic ulcer of other part of right foot with fat layer exposed: Secondary | ICD-10-CM

## 2016-11-23 DIAGNOSIS — N3000 Acute cystitis without hematuria: Secondary | ICD-10-CM | POA: Diagnosis not present

## 2016-11-23 DIAGNOSIS — R81 Glycosuria: Secondary | ICD-10-CM

## 2016-11-23 DIAGNOSIS — Z794 Long term (current) use of insulin: Secondary | ICD-10-CM

## 2016-11-23 DIAGNOSIS — IMO0002 Reserved for concepts with insufficient information to code with codable children: Secondary | ICD-10-CM

## 2016-11-23 DIAGNOSIS — N183 Chronic kidney disease, stage 3 unspecified: Secondary | ICD-10-CM

## 2016-11-23 DIAGNOSIS — G8929 Other chronic pain: Secondary | ICD-10-CM

## 2016-11-23 DIAGNOSIS — E08621 Diabetes mellitus due to underlying condition with foot ulcer: Secondary | ICD-10-CM | POA: Diagnosis not present

## 2016-11-23 DIAGNOSIS — E1165 Type 2 diabetes mellitus with hyperglycemia: Secondary | ICD-10-CM

## 2016-11-23 DIAGNOSIS — I700 Atherosclerosis of aorta: Secondary | ICD-10-CM | POA: Diagnosis not present

## 2016-11-23 DIAGNOSIS — E113553 Type 2 diabetes mellitus with stable proliferative diabetic retinopathy, bilateral: Secondary | ICD-10-CM | POA: Diagnosis not present

## 2016-11-23 LAB — POCT URINALYSIS DIP (MANUAL ENTRY)
Bilirubin, UA: NEGATIVE
Glucose, UA: 500 mg/dL — AB
Ketones, POC UA: NEGATIVE mg/dL
Leukocytes, UA: NEGATIVE
Nitrite, UA: NEGATIVE
Protein Ur, POC: 30 mg/dL — AB
Spec Grav, UA: 1.01 (ref 1.010–1.025)
Urobilinogen, UA: 0.2 E.U./dL
pH, UA: 5.5 (ref 5.0–8.0)

## 2016-11-23 LAB — POCT GLYCOSYLATED HEMOGLOBIN (HGB A1C): Hemoglobin A1C: 12.7

## 2016-11-23 MED ORDER — CARVEDILOL 25 MG PO TABS: 25.0000 mg | ORAL_TABLET | Freq: Two times a day (BID) | ORAL | 1 refills | Status: DC

## 2016-11-23 MED ORDER — SERTRALINE HCL 50 MG PO TABS: 50.0000 mg | ORAL_TABLET | Freq: Every day | ORAL | 1 refills | Status: DC

## 2016-11-23 MED ORDER — HYDROCHLOROTHIAZIDE 25 MG PO TABS: 25.0000 mg | ORAL_TABLET | Freq: Every day | ORAL | 1 refills | Status: DC

## 2016-11-23 MED ORDER — EXENATIDE 5 MCG/0.02ML ~~LOC~~ SOPN: 5.0000 ug | PEN_INJECTOR | Freq: Two times a day (BID) | SUBCUTANEOUS | 3 refills | Status: DC

## 2016-11-23 MED ORDER — BUSPIRONE HCL 15 MG PO TABS: 15.0000 mg | ORAL_TABLET | Freq: Three times a day (TID) | ORAL | 1 refills | Status: DC

## 2016-11-23 MED ORDER — INSULIN ASPART 100 UNIT/ML ~~LOC~~ SOLN: 10.0000 [IU] | Freq: Three times a day (TID) | SUBCUTANEOUS | 6 refills | Status: DC

## 2016-11-23 NOTE — Patient Instructions (Signed)
     IF you received an x-ray today, you will receive an invoice from [facility]. Please contact [facility] at [phone] with questions or concerns regarding your invoice.   IF you received labwork today, you will receive an invoice from [facility]. Please contact [facility] at [phone] with questions or concerns regarding your invoice.   Our billing staff will not be able to assist you with questions regarding bills from these companies.  You will be contacted with the lab results as soon as they are available. The fastest way to get your results is to activate your My Chart account. Instructions are located on the last page of this paperwork. If you have not heard from us regarding the results in 2 weeks, please contact this office.     

## 2016-11-23 NOTE — Progress Notes (Signed)
Chief Complaint  Patient presents with  . Establish Care    ulcer on botom of the foot   This is a new patient here to establish care from [place] for multiple medical problems and very complex uncontrolled diabetes.  HPI  Diabetes Mellitus:  Patient reports that her sugars have been high for years and she does not know why. She reports that this morning she ate a grilled cheese sandwich as well as eggs and drank a diet coke.  She drinks diet cokes and diet sundrops all day.  She does not exercise. Her sugars range from 200s-300s. She is never hypoglycemic and her average sugars are in in the 300s.  She has complications from her diabetes including a stroke a year ago. She takes insulin lantus 60 units bid now increase from 50 units at bedtime.  She takes insulin aspart 10 units tid before meals and sliding scale. She was taken off metformin. She was continued on exenatide.  Lab Results  Component Value Date   HGBA1C 12.7 11/23/2016   Lab Results  Component Value Date   HGBA1C 12.7 11/23/2016   Endocrine ROS: positive for - polydipsia/polyuria negative for - palpitations, temperature intolerance or unexpected weight changes   Her diabetes has the following complications Nephropathy- she has CKD stage 3 and was told never to take NSAIDs so she was taken off her meloxicam.  Her lisinopril was also stopped.  She only takes tylenol and goes to pain clinic for pain meds She reports that she urinates all the time She had a hospitalization for acute kidney injury a year ago related to an infection and dehydration.  Polyneuropathy- she reports that although her hands are good she has poor sensation in her feet and as far up as her calves She reports that she has numbness in her feet and required special precautions to prevent injury She has no tingling or numbness in her fingers She had a TIA a little over a year ago  She takes gabapentin  Vasculopathy-  Today she also present with  an ulcer at the pressure point beneath her great toe of the right foot. She reports that her foot feels warm but she has not hairs on her legs and has had multiple toes amputated due to poor circulation and infection.   Retinopathy- she has retinal disease bilaterally which has been stable but she has persistent blurry vision.  She has not seen her ophthalmologist and has relocated to [facility] from [place].  Other issues addressed today Acute Cystitis Patient was treated for UTI that was showing E. Coli. She wanted to ensure that her urine has cleared and that the infection has resolved She denies flank pain  Chronic Back Pain She reports that she has chronic low back pain for which she has had numerous surgeries. She was weaned off morphine and started on nucynta.  She denies constipation but reports that her pain is still uncontrolled. She knows that her diabetes is uncontrolled as well.  Her daughter who is here with her states that she thinks her mother diabetes makes her pain worse.  She also takes tizanidine and gabapentin.  Essential Hypertension and CAD She has a history of aortic atherosclerosis documented on CT scan She takes hctz 13m and carvedilol.  She was on lisinopril which was discontinued both for her ckd and due to her low bp She had a lexiscan that showed normal EF of 65% and normal wall motion without ischemic changes. She keeps going to the  ER for her chest pains though  Depression and Anxiety She reports that she ran out of zoloft 3 weeks ago She has been taking buspirone 51m three times daily She states that it has been helping her anxiety but has been only on buspirone   Depression screen PHQ 2/9 11/23/2016  Decreased Interest 0  Down, Depressed, Hopeless 0  PHQ - 2 Score 0     Past Medical History:  Diagnosis Date  . Allergy   . Arthritis   . Chronic kidney disease   . Chronic pain   . Coronary artery disease   . Diabetes mellitus without complication  ([place]   . Dyspnea   . GERD (gastroesophageal reflux disease)   . Heart murmur   . Hypertension   . Liver disease   . NASH (nonalcoholic steatohepatitis) 09/16/2016  . Obstructive sleep apnea   . Osteomyelitis ([place]   . Peripheral vascular disease ([place]   . Sleep apnea   . Stroke ([place]   . Ulcers of both great toes ([place] 11/07/2016   right great toe, started as finger nail size and grew in 6 days     Current Outpatient Prescriptions  Medication Sig Dispense Refill  . exenatide (BYETTA 5 MCG PEN) 5 MCG/0.02ML SOPN injection Inject 0.02 mLs (5 mcg total) into the skin 2 (two) times daily with a meal. 1.2 mL 3  . gabapentin (NEURONTIN) 100 MG capsule Take 100 mg by mouth 3 (three) times daily.    . insulin aspart (NOVOLOG) 100 UNIT/ML injection Inject 10 Units into the skin 3 (three) times daily before meals. 10 mL 6  . busPIRone (BUSPAR) 15 MG tablet Take 1 tablet (15 mg total) by mouth 3 (three) times daily. 90 tablet 1  . butalbital-acetaminophen-caffeine (FIORICET, ESGIC) 50-325-40 MG tablet Take 1 tablet by mouth every 6 (six) hours as needed for headache. 20 tablet 0  . carvedilol (COREG) 25 MG tablet Take 1 tablet (25 mg total) by mouth 2 (two) times daily. 180 tablet 1  . cephALEXin (KEFLEX) 500 MG capsule Take 2 capsules (1,000 mg total) by mouth 2 (two) times daily. 28 capsule 0  . gabapentin (NEURONTIN) 100 MG capsule Take 300 mg by mouth 3 (three) times daily.    . hydrochlorothiazide (HYDRODIURIL) 25 MG tablet Take 1 tablet (25 mg total) by mouth daily. 90 tablet 1  . insulin aspart (NOVOLOG) 100 UNIT/ML FlexPen Inject 2-10 Units into the skin See admin instructions. Based on sliding scale. Max 60 units a day.    . insulin glargine (LANTUS) 100 UNIT/ML injection Inject 60 Units into the skin 2 (two) times daily.    . ondansetron (ZOFRAN) 4 MG tablet Take 1 tablet (4 mg total) by mouth every 6 (six) hours. 12 tablet 0  . sertraline (ZOLOFT) 50 MG tablet Take 1 tablet (50 mg  total) by mouth daily. 90 tablet 1  . tapentadol (NUCYNTA) 50 MG tablet Take 50 mg by mouth 3 (three) times daily.    .Marland KitchentiZANidine (ZANAFLEX) 4 MG tablet Take 4 mg by mouth 3 (three) times daily.     No current facility-administered medications for this visit.     Allergies:  Allergies  Allergen Reactions  . Lasix [Furosemide]     Dehydration leading to kidney failure   . Norvasc [Amlodipine Besylate]     Severe edema  . Prednisone     ketoacidosis  . Sulfa Antibiotics Nausea Only  . Vancomycin Other (See Comments)    Total organ shut  down  . Doxycycline Hives  . Doxycycline Itching  . Inderal [Propranolol] Cough  . Lasix [Furosemide] Other (See Comments)    Dehydration leading to kidney failure  . Norvasc [Amlodipine Besylate] Other (See Comments)    Severe edema  . Prednisone Other (See Comments)    ketoacidosis  . Sulfa Antibiotics Nausea Only  . Inderal La [Propranolol Hcl] Cough    Past Surgical History:  Procedure Laterality Date  . ABDOMINAL HYSTERECTOMY    . BACK SURGERY    . CESAREAN SECTION    . CHOLECYSTECTOMY    . HERNIA REPAIR    . JOINT REPLACEMENT    . KNEE SURGERY    . SPINE SURGERY    . TOE AMPUTATION    . TOE AMPUTATION Bilateral    3 on right foot 2 left foot   . TONSILLECTOMY    . TUBAL LIGATION      Social History   Social History  . Marital status: Unknown    Spouse name: N/A  . Number of children: N/A  . Years of education: N/A   Social History Main Topics  . Smoking status: Never Smoker  . Smokeless tobacco: Never Used  . Alcohol use No  . Drug use: No  . Sexual activity: No   Other Topics Concern  . None   Social History Narrative   ** Merged History Encounter **        Family History  Problem Relation Age of Onset  . Heart disease Father   . Stroke Maternal Grandfather   . Heart disease Paternal Grandfather   . Cancer Mother        Thymoma, metastatic  . Stroke Father   . Multiple sclerosis Sister   . Epilepsy  Sister      Review of Systems  Constitutional: Negative for chills and fever.  HENT: Positive for tinnitus. Negative for congestion, ear discharge and sinus pain.   Eyes: Positive for blurred vision. Negative for double vision and photophobia.  Respiratory: Negative for cough, shortness of breath and wheezing.   Cardiovascular: Negative for chest pain and palpitations.  Gastrointestinal: Positive for heartburn and nausea. Negative for abdominal pain and vomiting.  Genitourinary: Negative for dysuria and urgency.  Musculoskeletal: Negative for back pain, joint pain and myalgias.  Skin: Negative for itching and rash.  Neurological: Positive for tingling. Negative for dizziness and headaches.  Endo/Heme/Allergies: Positive for polydipsia.  Psychiatric/Behavioral: Positive for depression. The patient is not nervous/anxious.      Objective: Vitals:   11/23/16 1434  BP: (!) 122/56  Pulse: 99  Resp: 16  SpO2: 97%  Weight: 232 lb 6.4 oz (105.4 kg)  Height: 5' 6"  (1.676 m)    Physical Exam  Constitutional: She is oriented to person, place, and time. She appears well-developed and well-nourished.  HENT:  Head: Normocephalic and atraumatic.  Right Ear: External ear normal.  Left Ear: External ear normal.  Numerous teeth pulled  Eyes: Conjunctivae and EOM are normal.  Neck: Normal range of motion. No thyromegaly present.  Cardiovascular: Normal rate, regular rhythm, normal heart sounds and intact distal pulses.   No murmur heard. Pulmonary/Chest: Effort normal and breath sounds normal. No respiratory distress. She has no wheezes.  Abdominal: Soft. Bowel sounds are normal. She exhibits no distension. There is no tenderness. There is no guarding.  Obese abdomen  Musculoskeletal: Normal range of motion. She exhibits edema and deformity.  Neurological: She is alert and oriented to person, place, and time.  Skin: Skin is warm. Capillary refill takes less than 2 seconds. No erythema.    Psychiatric: She has a normal mood and affect. Her behavior is normal. Judgment and thought content normal.       Component     Latest Ref Rng & Units 11/23/2016 11/23/2016         3:56 PM  3:56 PM  Color, UA     yellow yellow   Clarity, UA     clear clear   Glucose     negative mg/dL =500 (A)   Bilirubin, UA     negative negative negative  Specific Gravity, UA     1.010 - 1.025 1.010   RBC, UA     negative trace-lysed (A)   pH, UA     5.0 - 8.0 5.5   Protein, UA     negative mg/dL =30 (A)   Urobilinogen, UA     0.2 or 1.0 E.U./dL 0.2   Nitrite, UA     Negative Negative   Leukocytes, UA     Negative Negative     Review of Care everywhere for outside records a1c 09/21/16 12.2 04/16/16 13.5 11/26/16  9.2  Lipid profile  04/16/16 TC 200   TG 512  HDL 30   LDL 82  Ct ABD 09/20/16 AORTIC ATHEROSCLEROSIS Irregular peripheral hepatic massess with mild perihepatic lymphadenopathy Spinal stimulator noted  Assessment and Plan [place] was seen today for establish care.  Diagnoses and all orders for this visit:  Uncontrolled type 2 diabetes mellitus with hyperglycemia ([place])- will refer to Endocrinology She should cut back on her sodas Advised referral to Endocrinology -     Ambulatory referral to Endocrinology -     POCT glycosylated hemoglobin (Hb A1C) -     POCT urinalysis dipstick -     Microalbumin, urine  Acute cystitis without hematuria- urine with nitrites and LE -     POCT urinalysis dipstick  Glucosuria- discussed that she has polyuria and glucosuria Advised improved diabetes control -     POCT urinalysis dipstick  Need for Tdap vaccination  Diabetic ulcer of toe of right foot associated with diabetes mellitus due to underlying condition, with fat layer exposed ([place]) -  Multiple amputations  Needs to establish with new podiatrist and possible vascular surgery -     Ambulatory referral to Podiatry  Diabetic vasculopathy [facility])- referral placed to  Cardiology for continuity of care -     Ambulatory referral to Endocrinology -     Ambulatory referral to Cardiology  Stable proliferative diabetic retinopathy of both eyes associated with type 2 diabetes mellitus ([place]) Will refer to Opthalmology if pt needs Will discuss at next visit -     Ambulatory referral to Endocrinology  Uncontrolled type 2 diabetes mellitus with diabetic polyneuropathy, with long-term current use of insulin ([place]) Continue gabapentin -     Ambulatory referral to Endocrinology -     Ambulatory referral to Pain Clinic  Angina pectoris syndrome [facility])-  She will establish with Cardiology  Aortic atherosclerosis ([place])- now off lipids due to fatty liver disease Talked about other ways to improve lipids  Liver masses- will discuss later  Chronic bilateral back pain, unspecified back location -  Pt to establish care Since she has fatty liver disease and kidney disease she should continue to avoid NSAID and also be gentle with her tylenol intake -     Ambulatory referral to Pain Clinic  CKD (chronic kidney disease) stage 3, GFR  30-59 ml/min ([place])- due to uncontrolled diabetes Currently stablized -     Ambulatory referral to Endocrinology  Other orders -     Tdap vaccine greater than or equal to 7yo IM -     busPIRone (BUSPAR) 15 MG tablet; Take 1 tablet (15 mg total) by mouth 3 (three) times daily. -     carvedilol (COREG) 25 MG tablet; Take 1 tablet (25 mg total) by mouth 2 (two) times daily. -     hydrochlorothiazide (HYDRODIURIL) 25 MG tablet; Take 1 tablet (25 mg total) by mouth daily. -     sertraline (ZOLOFT) 50 MG tablet; Take 1 tablet (50 mg total) by mouth daily. -     insulin aspart (NOVOLOG) 100 UNIT/ML injection; Inject 10 Units into the skin 3 (three) times daily before meals. -     exenatide (BYETTA 5 MCG PEN) 5 MCG/0.02ML SOPN injection; Inject 0.02 mLs (5 mcg total) into the skin 2 (two) times daily with a meal. -     Cancel: Flu Vaccine QUAD 36+  mos IM -     Cancel: Tdap vaccine greater than or equal to 7yo IM   A total of 60 minutes were spent face-to-face with the patient during this encounter and over half of that time was spent on counseling and coordination of care.   [place]

## 2016-11-24 ENCOUNTER — Telehealth: Payer: Self-pay | Admitting: Family Medicine

## 2016-11-24 LAB — MICROALBUMIN, URINE: MICROALBUM., U, RANDOM: 91.9 ug/mL

## 2016-11-24 NOTE — Telephone Encounter (Signed)
BD ULTRA FINE NEEDLES THE LONG ONES 29 GAUGE 100 PENS PER BOX   PATIENT STATES THAT SHE  WILL BE OUT OF PENS TOMORROW   SHE ALSO STATES THAT THE PRESCRIPTION NEED TO SAY THAT SHE USES UP TO 7 PENS PER DAY

## 2016-11-26 ENCOUNTER — Telehealth: Payer: Self-pay

## 2016-11-26 NOTE — Telephone Encounter (Signed)
Phoned in BD ultra fine needles 29 gauge #100/box x 2 with 2 refills.   Spoke with daughter an advised will phone in to pharmacy.

## 2016-11-27 NOTE — Telephone Encounter (Signed)
Dr. Nolon Rod, I'm not sure what to order here. Can you please advise.

## 2016-12-03 ENCOUNTER — Encounter (HOSPITAL_COMMUNITY): Payer: Self-pay | Admitting: Emergency Medicine

## 2016-12-03 ENCOUNTER — Observation Stay (HOSPITAL_COMMUNITY)
Admission: EM | Admit: 2016-12-03 | Discharge: 2016-12-05 | Disposition: A | Discharge status: Home / Self Care | Payer: Medicare HMO | Attending: Family Medicine | Admitting: Family Medicine

## 2016-12-03 ENCOUNTER — Emergency Department (HOSPITAL_COMMUNITY): Payer: Medicare HMO

## 2016-12-03 DIAGNOSIS — Z794 Long term (current) use of insulin: Secondary | ICD-10-CM | POA: Diagnosis not present

## 2016-12-03 DIAGNOSIS — Z8673 Personal history of transient ischemic attack (TIA), and cerebral infarction without residual deficits: Secondary | ICD-10-CM | POA: Diagnosis not present

## 2016-12-03 DIAGNOSIS — E1142 Type 2 diabetes mellitus with diabetic polyneuropathy: Secondary | ICD-10-CM | POA: Diagnosis present

## 2016-12-03 DIAGNOSIS — E1151 Type 2 diabetes mellitus with diabetic peripheral angiopathy without gangrene: Secondary | ICD-10-CM | POA: Insufficient documentation

## 2016-12-03 DIAGNOSIS — Z881 Allergy status to other antibiotic agents status: Secondary | ICD-10-CM | POA: Insufficient documentation

## 2016-12-03 DIAGNOSIS — L97509 Non-pressure chronic ulcer of other part of unspecified foot with unspecified severity: Secondary | ICD-10-CM

## 2016-12-03 DIAGNOSIS — G894 Chronic pain syndrome: Secondary | ICD-10-CM | POA: Diagnosis present

## 2016-12-03 DIAGNOSIS — E861 Hypovolemia: Secondary | ICD-10-CM | POA: Diagnosis not present

## 2016-12-03 DIAGNOSIS — E11621 Type 2 diabetes mellitus with foot ulcer: Principal | ICD-10-CM | POA: Diagnosis present

## 2016-12-03 DIAGNOSIS — E1165 Type 2 diabetes mellitus with hyperglycemia: Secondary | ICD-10-CM | POA: Diagnosis not present

## 2016-12-03 DIAGNOSIS — L97415 Non-pressure chronic ulcer of right heel and midfoot with muscle involvement without evidence of necrosis: Secondary | ICD-10-CM

## 2016-12-03 DIAGNOSIS — N183 Chronic kidney disease, stage 3 unspecified: Secondary | ICD-10-CM | POA: Diagnosis present

## 2016-12-03 DIAGNOSIS — Z79899 Other long term (current) drug therapy: Secondary | ICD-10-CM | POA: Insufficient documentation

## 2016-12-03 DIAGNOSIS — F419 Anxiety disorder, unspecified: Secondary | ICD-10-CM | POA: Diagnosis not present

## 2016-12-03 DIAGNOSIS — E119 Type 2 diabetes mellitus without complications: Secondary | ICD-10-CM

## 2016-12-03 DIAGNOSIS — E1122 Type 2 diabetes mellitus with diabetic chronic kidney disease: Secondary | ICD-10-CM | POA: Insufficient documentation

## 2016-12-03 DIAGNOSIS — I2510 Atherosclerotic heart disease of native coronary artery without angina pectoris: Secondary | ICD-10-CM | POA: Insufficient documentation

## 2016-12-03 DIAGNOSIS — G4733 Obstructive sleep apnea (adult) (pediatric): Secondary | ICD-10-CM

## 2016-12-03 DIAGNOSIS — I959 Hypotension, unspecified: Secondary | ICD-10-CM | POA: Diagnosis not present

## 2016-12-03 DIAGNOSIS — M549 Dorsalgia, unspecified: Secondary | ICD-10-CM

## 2016-12-03 DIAGNOSIS — K219 Gastro-esophageal reflux disease without esophagitis: Secondary | ICD-10-CM | POA: Insufficient documentation

## 2016-12-03 DIAGNOSIS — E1169 Type 2 diabetes mellitus with other specified complication: Secondary | ICD-10-CM | POA: Diagnosis not present

## 2016-12-03 DIAGNOSIS — I129 Hypertensive chronic kidney disease with stage 1 through stage 4 chronic kidney disease, or unspecified chronic kidney disease: Secondary | ICD-10-CM | POA: Insufficient documentation

## 2016-12-03 DIAGNOSIS — G8929 Other chronic pain: Secondary | ICD-10-CM | POA: Diagnosis present

## 2016-12-03 DIAGNOSIS — Z9989 Dependence on other enabling machines and devices: Secondary | ICD-10-CM

## 2016-12-03 LAB — COMPREHENSIVE METABOLIC PANEL
ALK PHOS: 86 U/L (ref 38–126)
ALT: 19 U/L (ref 14–54)
ANION GAP: 8 (ref 5–15)
AST: 22 U/L (ref 15–41)
Albumin: 3.2 g/dL — ABNORMAL LOW (ref 3.5–5.0)
BUN: 22 mg/dL — AB (ref 6–20)
CALCIUM: 8.7 mg/dL — AB (ref 8.9–10.3)
CO2: 24 mmol/L (ref 22–32)
CREATININE: 1.58 mg/dL — AB (ref 0.44–1.00)
Chloride: 94 mmol/L — ABNORMAL LOW (ref 101–111)
GFR calc non Af Amer: 34 mL/min — ABNORMAL LOW (ref >60)
GFR, EST AFRICAN AMERICAN: 40 mL/min — AB (ref >60)
Glucose, Bld: 638 mg/dL (ref 65–99)
Potassium: 3.8 mmol/L (ref 3.5–5.1)
Sodium: 126 mmol/L — ABNORMAL LOW (ref 135–145)
Total Bilirubin: 0.7 mg/dL (ref 0.3–1.2)
Total Protein: 6.4 g/dL — ABNORMAL LOW (ref 6.5–8.1)

## 2016-12-03 LAB — CBC WITH DIFFERENTIAL/PLATELET
BASOS PCT: 0 %
Basophils Absolute: 0 10*3/uL (ref 0.0–0.1)
EOS ABS: 0 10*3/uL (ref 0.0–0.7)
EOS PCT: 0 %
HCT: 36.5 % (ref 36.0–46.0)
HEMOGLOBIN: 12 g/dL (ref 12.0–15.0)
LYMPHS ABS: 1 10*3/uL (ref 0.7–4.0)
Lymphocytes Relative: 21 %
MCH: 27.1 pg (ref 26.0–34.0)
MCHC: 32.9 g/dL (ref 30.0–36.0)
MCV: 82.4 fL (ref 78.0–100.0)
MONO ABS: 0.5 10*3/uL (ref 0.1–1.0)
MONOS PCT: 10 %
NEUTROS PCT: 69 %
Neutro Abs: 3.3 10*3/uL (ref 1.7–7.7)
PLATELETS: 172 10*3/uL (ref 150–400)
RBC: 4.43 MIL/uL (ref 3.87–5.11)
RDW: 13.2 % (ref 11.5–15.5)
WBC: 4.8 10*3/uL (ref 4.0–10.5)

## 2016-12-03 LAB — CBG MONITORING, ED: GLUCOSE-CAPILLARY: 462 mg/dL — AB (ref 65–99)

## 2016-12-03 LAB — I-STAT CG4 LACTIC ACID, ED
LACTIC ACID, VENOUS: 2.28 mmol/L — AB (ref 0.5–1.9)
Lactic Acid, Venous: 2.48 mmol/L (ref 0.5–1.9)

## 2016-12-03 MED ORDER — CLINDAMYCIN PHOSPHATE 600 MG/50ML IV SOLN
600.0000 mg | Freq: Once | INTRAVENOUS | Status: AC
  Administered 2016-12-04: 600 mg via INTRAVENOUS
  Filled 2016-12-03: qty 50

## 2016-12-03 MED ORDER — SODIUM CHLORIDE 0.9 % IV BOLUS (SEPSIS)
1000.0000 mL | Freq: Once | INTRAVENOUS | Status: AC
  Administered 2016-12-03: 1000 mL via INTRAVENOUS

## 2016-12-03 MED ORDER — INSULIN ASPART 100 UNIT/ML ~~LOC~~ SOLN
10.0000 [IU] | Freq: Once | SUBCUTANEOUS | Status: AC
  Administered 2016-12-03: 10 [IU] via INTRAVENOUS
  Filled 2016-12-03: qty 1

## 2016-12-03 MED ORDER — CIPROFLOXACIN IN D5W 400 MG/200ML IV SOLN: 400.0000 mg | Freq: Once | INTRAVENOUS | Status: DC

## 2016-12-03 NOTE — ED Notes (Signed)
Brian-RN @ [facility] notified of abnormal Cg-4

## 2016-12-03 NOTE — ED Triage Notes (Signed)
Pt reports diabetic ulcer on bottom of R foot X several weeks. Approx size of golfball. Her PCP is aware and is also scheduled to see podiatrist next week.

## 2016-12-03 NOTE — H&P (Signed)
[facility] Admission History and Physical Service Pager: [phone]  Patient name: [name] Medical record number: [id number] Date of birth: 16-May-1955 Age: 61 y.o. Gender: female  Primary Care Provider: Patient, No Pcp Per Consultants: Orthopedic surgery Code Status: Full  Chief Complaint: foot ulcer on R foot  Assessment and Plan: Kimiah Kulig is a 61 y.o. female presenting with R diabetic foot ulcer . PMH is significant for Type 2 diabetes, HTN, CKD stage 3, NASH, OSA w/ CPAP  Diabetic foot ulcer Patient reports chronic ulcer on the forefoot at the first digit on her R foot. Amputation of 2nd digit on L side in 2010 2/2 osteomyelitis and digits 2-4 on the R.  Currently has 2.5x3.5cm ulcer with hyperkeratotic tissue around the periphery with some serosanguineous fluid in the center.  There is no pain to palpation and no systemic signs or symptoms.  There are no radiologic findings of osteomyelitis seen on foot x-ray and does not appear to be acutely infected.  -admit to FMTS under attending McDiarmid -wound care consult -zyvox 653m IV q12hrs (will consult ID) -consider ortho consult - bone scan, d/t patient having spinal stimulator incompatible with MRI -diabetes management as below  Hyperglycemia with history of type 2 diabetes Takes byetta 576m BID, novolog 10U TID with SSI, lantus 60U BID.  CBG greater than 600 without gap.  Was given 10 of NovoLog in the ED and started on resistant SS. She remains in the 450s this AM.  -rSSI -lantus 40U BID -continue to monitor and adjust as appropriate  HTN -continue coreg 2568mID daily -continue HCTZ 42m39mily  CKD stage 3 Stable. -avoid nephrotoxic agents -continue to monitor  OSA "Lost" her cpap in 2010. Apparently recently has titration study performed.  -can consider cpap at night  Chronic back pain Takes tapentadol 50mg54m and zanaflex 4mg T42m Multiple surgeries.  - continue current  regimen  Anxiety Stable -continue buspar and zoloft   FEN/GI: Diabetic diet/ Prophylaxis: subcutaneous heparin  Disposition: admit to med surg under attending mcdiarmid   History of Present Illness:  [name] is a 61 y.o34female presenting with worsening diabetic foot ulcer her right forefoot.  She reports that her ulcer has been stable for many months at this point but over the past several days has opened up and started draining serosanguineous fluid over the past 24 hours.  Her daughter "twisted her arm" to get her to come to the hospital.  She denies any pain, fever, chills, nausea, vomiting or diarrhea.  She does have significant neuropathy secondary to her poorly controlled diabetes.  Also has history of multiple amputations of her bilateral toes due to osteomyelitis.   In the ED she had an x-ray of her foot showing no radiological findings of osteomyelitis.  She was given a dose of ciprofloxacin and clindamycin by ED physician and was also noted to have some CBG greater than 600 with no anion gap.  She was given NovoLog and her CBGs showed some improvement.  She was admitted for management of diabetes and for evaluation of diabetic foot ulcer.   Review Of Systems: Per HPI   ROS  Patient Active Problem List   Diagnosis Date Noted  . Diabetic foot ulcer (HCC) 1[place]2/2018  . Liver mass 09/20/2016  . Kidney disease, chronic, stage III (GFR 30-59 ml/min) (HCC) 12/17/2015  . Uncontrolled type 2 diabetes mellitus with diabetic polyneuropathy, with long-term current use of insulin (HCC) 0[place]5/2017  . Essential hypertension 06/06/2015  .  Chronic back pain 07/27/2012  . OSA on CPAP 03/21/2012    Past Medical History: Past Medical History:  Diagnosis Date  . Allergy   . Arthritis   . Chronic kidney disease   . Chronic pain   . Coronary artery disease   . Diabetes mellitus without complication ([place])   . Dyspnea   . GERD (gastroesophageal reflux disease)   . Heart murmur     . Hypertension   . Liver disease   . NASH (nonalcoholic steatohepatitis) 09/16/2016  . Obstructive sleep apnea   . Osteomyelitis ([place])   . Peripheral vascular disease ([place])   . Sleep apnea   . Stroke ([place])   . Ulcers of both great toes ([place]) 11/07/2016   right great toe, started as finger nail size and grew in 6 days     Past Surgical History: Past Surgical History:  Procedure Laterality Date  . ABDOMINAL HYSTERECTOMY    . BACK SURGERY    . CESAREAN SECTION    . CHOLECYSTECTOMY    . HERNIA REPAIR    . JOINT REPLACEMENT    . KNEE SURGERY    . SPINE SURGERY    . TOE AMPUTATION    . TOE AMPUTATION Bilateral    3 on right foot 2 left foot   . TONSILLECTOMY    . TUBAL LIGATION      Social History: Social History  Substance Use Topics  . Smoking status: Never Smoker  . Smokeless tobacco: Never Used  . Alcohol use No    Family History: Family History  Problem Relation Age of Onset  . Heart disease Father   . Stroke Maternal Grandfather   . Heart disease Paternal Grandfather   . Cancer Mother        Thymoma, metastatic  . Stroke Father   . Multiple sclerosis Sister   . Epilepsy Sister     Allergies and Medications: Allergies  Allergen Reactions  . Lasix [Furosemide]     Dehydration leading to kidney failure   . Norvasc [Amlodipine Besylate]     Severe edema  . Prednisone Other (See Comments)    Ketoacidosis  . Sulfa Antibiotics Nausea Only  . Vancomycin Other (See Comments)    Total organ shut down  . Doxycycline Hives  . Doxycycline Itching  . Ibuprofen Other (See Comments)    WAS TOLD TO NOT TAKE THIS BECAUSE OF HER KIDNEYS AND LIVER  . Inderal [Propranolol] Cough  . Lasix [Furosemide] Other (See Comments)    Dehydration leading to kidney failure  . Lisinopril Other (See Comments)    WAS TOLD TO NOT TAKE THIS BECAUSE OF HER KIDNEYS AND LIVER  . Lovastatin Other (See Comments)    WAS TOLD TO NOT TAKE THIS BECAUSE OF HER KIDNEYS AND LIVER  . Norvasc  [Amlodipine Besylate] Other (See Comments)    Severe edema  . Nsaids Other (See Comments)    WAS TOLD TO NOT TAKE THIS BECAUSE OF HER KIDNEYS AND LIVER  . Prednisone Other (See Comments)    Ketoacidosis   . Sulfa Antibiotics Nausea Only  . Inderal La [Propranolol Hcl] Cough   No current facility-administered medications on file prior to encounter.    Current Outpatient Prescriptions on File Prior to Encounter  Medication Sig Dispense Refill  . busPIRone (BUSPAR) 15 MG tablet Take 1 tablet (15 mg total) by mouth 3 (three) times daily. 90 tablet 1  . carvedilol (COREG) 25 MG tablet Take 1 tablet (25 mg total) by  mouth 2 (two) times daily. 180 tablet 1  . exenatide (BYETTA 5 MCG PEN) 5 MCG/0.02ML SOPN injection Inject 0.02 mLs (5 mcg total) into the skin 2 (two) times daily with a meal. 1.2 mL 3  . hydrochlorothiazide (HYDRODIURIL) 25 MG tablet Take 1 tablet (25 mg total) by mouth daily. 90 tablet 1  . insulin aspart (NOVOLOG) 100 UNIT/ML FlexPen Inject 10 Units into the skin 3 (three) times daily before meals. "PER A SLIDING SCALE OF AN ADDITIONAL 2-10 UNITS"    . sertraline (ZOLOFT) 50 MG tablet Take 1 tablet (50 mg total) by mouth daily. 90 tablet 1  . tapentadol (NUCYNTA) 50 MG tablet Take 50 mg by mouth 3 (three) times daily.    Marland Kitchen tiZANidine (ZANAFLEX) 4 MG tablet Take 4 mg by mouth 3 (three) times daily.    . butalbital-acetaminophen-caffeine (FIORICET, ESGIC) 50-325-40 MG tablet Take 1 tablet by mouth every 6 (six) hours as needed for headache. (Patient not taking: Reported on 12/03/2016) 20 tablet 0  . cephALEXin (KEFLEX) 500 MG capsule Take 2 capsules (1,000 mg total) by mouth 2 (two) times daily. (Patient not taking: Reported on 12/03/2016) 28 capsule 0  . insulin aspart (NOVOLOG) 100 UNIT/ML injection Inject 10 Units into the skin 3 (three) times daily before meals. (Patient not taking: Reported on 12/03/2016) 10 mL 6  . ondansetron (ZOFRAN) 4 MG tablet Take 1 tablet (4 mg total) by  mouth every 6 (six) hours. (Patient not taking: Reported on 12/03/2016) 12 tablet 0    Objective: BP (!) 99/40   Pulse 82   Temp 98.2 F (36.8 C) (Oral)   Resp 15   Ht 5' 6"  (1.676 m)   Wt 230 lb (104.3 kg)   SpO2 99%   BMI 37.12 kg/m  Exam: General: 61 year old female sitting up in bed appearing comfortable and in no acute distress Eyes: Extraocular muscles intact, pupils equal round reactive to light bilaterally ENTM: No nasal drainage, moist mucous membranes and clear oropharynx Neck: No apparent JVD, no lymphadenopathy Cardiovascular: Regular rate and rhythm, no apparent murmurs Respiratory: Normal work of breathing, clear to auscultation bilaterally, no wheezing or rhonchi Gastrointestinal: Obese abdomen, soft, nontender nondistended without peritoneal signs or apparent organomegaly MSK: No edema, right foot with second through fourth digits amputated, left foot fourth digit amputated Derm: 2.5 x 3.5 ulcer on the right forefoot in the area of the first ray with surrounding hyperkeratotic tissue, some serosanguineous fluid weeping from the periphery.  Area of marked erythema on right lower extremity with some minimal warmth no tenderness. Neuro: Alert and oriented x3, decreased sensation in the bilateral feet Psych: Pleasant, normal mood and affect        Labs and Imaging: CBC BMET   Recent Labs Lab 12/04/16 0417  WBC 4.3  HGB 12.8  HCT 38.1  PLT 165    Recent Labs Lab 12/04/16 0417  NA 133*  K 3.5  CL 99*  CO2 22  BUN 20  CREATININE 1.56*  GLUCOSE 445*  CALCIUM 8.8*     Dg Foot 2 Views Right  Result Date: 12/03/2016 CLINICAL DATA:  Diabetic foot ulcer for 2 weeks. Site of ulcer not specified. EXAM: RIGHT FOOT - 2 VIEW COMPARISON:  None. FINDINGS: Prior resection of the second and fourth toe and amputation at the mid third proximal phalanx. The resection margins are smooth. Soft tissue defect about the plantar forefoot adjacent to the metatarsal  phalangeal joints, not localized on the AP view. No abnormal bone  density, periosteal reaction or bony destructive change. No radiopaque foreign body or tracking soft tissue air. There is hallux valgus and degenerative change of the first metatarsal phalangeal joint. Hammertoe deformity of the fifth digit. Plantar calcaneal spur. Irregular coarse soft tissue calcifications at the Achilles tendon insertion and in the plantar hindfoot. IMPRESSION: Soft tissue defect about the plantar metatarsal phalangeal joints. No radiographic findings of osteomyelitis or radiopaque foreign body. Electronically Signed   By: Jeb Levering M.D.   On: 12/03/2016 22:11    Eloise Levels, MD 12/04/2016, 6:15 AM PGY-2, [place] Intern pager: [phone], text pages welcome

## 2016-12-03 NOTE — ED Provider Notes (Signed)
[facility] EMERGENCY DEPARTMENT Provider Note   CSN: [id number] Arrival date & time: 12/03/16  1833     History   Chief Complaint Chief Complaint  Patient presents with  . Skin Ulcer    HPI [name] is a 61 y.o. female.  HPI  61 year old female history of diabetes, coronary artery disease, CKD history of diabetic foot ulcers presents today stating that she has had an ulcer on the bottom of her right foot for several weeks.  Her PCP has seen her and she is scheduled to be seen by podiatry on Monday.  She states that the ulcer has been getting larger over the past 2 days.  She has not noted any fever, chills, nausea, or vomiting.  She has had decreased appetite.  She has felt somewhat generally weak.  She states that she has been taking her medications as prescribed until this evening.  She did not eat dinner or take her evening doses of his diabetic medications  Past Medical History:  Diagnosis Date  . Allergy   . Arthritis   . Chronic kidney disease   . Chronic pain   . Coronary artery disease   . Diabetes mellitus without complication ([place])   . Dyspnea   . GERD (gastroesophageal reflux disease)   . Heart murmur   . Hypertension   . Liver disease   . NASH (nonalcoholic steatohepatitis) 09/16/2016  . Obstructive sleep apnea   . Osteomyelitis ([place])   . Peripheral vascular disease ([place])   . Sleep apnea   . Stroke ([place])   . Ulcers of both great toes ([place]) 11/07/2016   right great toe, started as finger nail size and grew in 6 days     Patient Active Problem List   Diagnosis Date Noted  . Liver mass 09/20/2016  . Kidney disease, chronic, stage III (GFR 30-59 ml/min) (HCC) 12/17/2015  . Uncontrolled type 2 diabetes mellitus with diabetic polyneuropathy, with long-term current use of insulin ([place]) 07/18/2015  . Essential hypertension 06/06/2015  . Chronic back pain 07/27/2012  . OSA on CPAP 03/21/2012    Past Surgical History:  Procedure  Laterality Date  . ABDOMINAL HYSTERECTOMY    . BACK SURGERY    . CESAREAN SECTION    . CHOLECYSTECTOMY    . HERNIA REPAIR    . JOINT REPLACEMENT    . KNEE SURGERY    . SPINE SURGERY    . TOE AMPUTATION    . TOE AMPUTATION Bilateral    3 on right foot 2 left foot   . TONSILLECTOMY    . TUBAL LIGATION      OB History    No data available       Home Medications    Prior to Admission medications   Medication Sig Start Date End Date Taking? Authorizing Provider  busPIRone (BUSPAR) 15 MG tablet Take 1 tablet (15 mg total) by mouth 3 (three) times daily. 11/23/16   Forrest Moron, MD  butalbital-acetaminophen-caffeine (FIORICET, ESGIC) [phone] MG tablet Take 1 tablet by mouth every 6 (six) hours as needed for headache. 10/29/16 10/29/17  Lawyer, Harrell Gave, PA-C  carvedilol (COREG) 25 MG tablet Take 1 tablet (25 mg total) by mouth 2 (two) times daily. 11/23/16   Forrest Moron, MD  cephALEXin (KEFLEX) 500 MG capsule Take 2 capsules (1,000 mg total) by mouth 2 (two) times daily. 10/29/16   Lawyer, Harrell Gave, PA-C  exenatide (BYETTA 5 MCG PEN) 5 MCG/0.02ML SOPN injection Inject 0.02 mLs (  5 mcg total) into the skin 2 (two) times daily with a meal. 11/23/16   Stallings, Zoe A, MD  gabapentin (NEURONTIN) 100 MG capsule Take 300 mg by mouth 3 (three) times daily.    [provider]  gabapentin (NEURONTIN) 100 MG capsule Take 100 mg by mouth 3 (three) times daily.    [provider]  gabapentin (NEURONTIN) 300 MG capsule Take 300 mg by mouth 3 (three) times daily. 11/04/16   [provider]  hydrochlorothiazide (HYDRODIURIL) 25 MG tablet Take 1 tablet (25 mg total) by mouth daily. 11/23/16   Forrest Moron, MD  insulin aspart (NOVOLOG) 100 UNIT/ML FlexPen Inject 2-10 Units into the skin See admin instructions. Based on sliding scale. Max 60 units a day. 06/06/15   [provider]  insulin aspart (NOVOLOG) 100 UNIT/ML injection Inject 10 Units into the  skin 3 (three) times daily before meals. 11/23/16   Forrest Moron, MD  insulin glargine (LANTUS) 100 UNIT/ML injection Inject 60 Units into the skin 2 (two) times daily. 04/08/15   [provider]  LANTUS SOLOSTAR 100 UNIT/ML Solostar Pen  11/22/16   [provider]  ondansetron (ZOFRAN) 4 MG tablet Take 1 tablet (4 mg total) by mouth every 6 (six) hours. 09/28/16   Janne Napoleon, NP  sertraline (ZOLOFT) 50 MG tablet Take 1 tablet (50 mg total) by mouth daily. 11/23/16   Forrest Moron, MD  tapentadol (NUCYNTA) 50 MG tablet Take 50 mg by mouth 3 (three) times daily.    [provider]  tiZANidine (ZANAFLEX) 4 MG tablet Take 4 mg by mouth 3 (three) times daily.    [provider]    Family History Family History  Problem Relation Age of Onset  . Heart disease Father   . Stroke Maternal Grandfather   . Heart disease Paternal Grandfather   . Cancer Mother        Thymoma, metastatic  . Stroke Father   . Multiple sclerosis Sister   . Epilepsy Sister     Social History Social History  Substance Use Topics  . Smoking status: Never Smoker  . Smokeless tobacco: Never Used  . Alcohol use No     Allergies   Lasix [furosemide]; Norvasc [amlodipine besylate]; Prednisone; Sulfa antibiotics; Vancomycin; Doxycycline; Doxycycline; Inderal [propranolol]; Lasix [furosemide]; Norvasc [amlodipine besylate]; Prednisone; Sulfa antibiotics; and Inderal la [propranolol hcl]   Review of Systems Review of Systems  Constitutional: Positive for activity change and appetite change.  HENT: Negative.   Eyes: Negative.   Respiratory: Negative.   Cardiovascular: Negative.   Gastrointestinal: Negative.   Endocrine: Negative.   Genitourinary: Negative.   Musculoskeletal: Negative.   Skin: Negative.   Allergic/Immunologic: Negative.   Neurological: Negative.   All other systems reviewed and are negative.    Physical Exam Updated Vital Signs BP 114/71 (BP  Location: Right Arm)   Pulse 76   Temp 97.6 F (36.4 C) (Oral)   Resp 16   Ht 1.676 m (5' 6" )   Wt 104.3 kg (230 lb)   SpO2 100%   BMI 37.12 kg/m   Physical Exam  Constitutional: She is oriented to person, place, and time. She appears well-developed and well-nourished.  HENT:  Head: Normocephalic and atraumatic.  Right Ear: External ear normal.  Eyes: Pupils are equal, round, and reactive to light.  Neck: Normal range of motion. Neck supple.  Cardiovascular: Normal rate.   Pulmonary/Chest: Effort normal.  Abdominal: Soft.  Musculoskeletal: Normal range of  motion.  Bilateral foot partial amputations.  Right foot with dorsal ulcer base of right great toe. Some erythema noted calf, no streaking mild warmth  Neurological: She is alert and oriented to person, place, and time. She displays normal reflexes. No cranial nerve deficit. Coordination normal.  Skin: Capillary refill takes less than 2 seconds.  Psychiatric: She has a normal mood and affect.  Nursing note and vitals reviewed.    ED Treatments / Results  Labs (all labs ordered are listed, but only abnormal results are displayed) Labs Reviewed  COMPREHENSIVE METABOLIC PANEL - Abnormal; Notable for the following:       Result Value   Sodium 126 (*)    Chloride 94 (*)    Glucose, Bld 638 (*)    BUN 22 (*)    Creatinine, Ser 1.58 (*)    Calcium 8.7 (*)    Total Protein 6.4 (*)    Albumin 3.2 (*)    GFR calc non Af Amer 34 (*)    GFR calc Af Amer 40 (*)    All other components within normal limits  I-STAT CG4 LACTIC ACID, ED - Abnormal; Notable for the following:    Lactic Acid, Venous 2.48 (*)    All other components within normal limits  I-STAT CG4 LACTIC ACID, ED - Abnormal; Notable for the following:    Lactic Acid, Venous 2.28 (*)    All other components within normal limits  CBC WITH DIFFERENTIAL/PLATELET    EKG  EKG Interpretation None       Radiology No results found.  Procedures Procedures  (including critical care time)  Medications Ordered in ED Medications  sodium chloride 0.9 % bolus 1,000 mL (not administered)  insulin aspart (novoLOG) injection 10 Units (not administered)     Initial Impression / Assessment and Plan / ED Course  I have reviewed the triage vital signs and the nursing notes.  Pertinent labs & imaging results that were available during my care of the patient were reviewed by me and considered in my medical decision making (see chart for details).     1- diabetic foot infection right foot- no evidence of osteomyeltiis currently, some concern for spreading infection with erythema proximally in the right lower leg.  She had a elevated lactic acid at 2.48 which appears to be clearing with the repeat at 2.28.  Heart rate has been normal with systolic blood pressures in the low 100s. 2- hyperglycemia- iv fluids and insulin infusing.  NO evidence of dka 3- cri- creatinine stable Cussed with family practice resident as before next unassigned.  I will see for admission. Final Clinical Impressions(s) / ED Diagnoses   Final diagnoses:  Diabetic ulcer of right midfoot associated with type 2 diabetes mellitus, with muscle involvement without evidence of necrosis [facility])    New Prescriptions New Prescriptions   No medications on file     Pattricia Boss, MD 12/03/16 2252

## 2016-12-04 ENCOUNTER — Observation Stay (HOSPITAL_COMMUNITY): Payer: Medicare HMO

## 2016-12-04 DIAGNOSIS — G894 Chronic pain syndrome: Secondary | ICD-10-CM | POA: Diagnosis not present

## 2016-12-04 DIAGNOSIS — Z9989 Dependence on other enabling machines and devices: Secondary | ICD-10-CM

## 2016-12-04 DIAGNOSIS — E11621 Type 2 diabetes mellitus with foot ulcer: Principal | ICD-10-CM

## 2016-12-04 DIAGNOSIS — Z794 Long term (current) use of insulin: Secondary | ICD-10-CM

## 2016-12-04 DIAGNOSIS — N183 Chronic kidney disease, stage 3 (moderate): Secondary | ICD-10-CM | POA: Diagnosis not present

## 2016-12-04 DIAGNOSIS — E1142 Type 2 diabetes mellitus with diabetic polyneuropathy: Secondary | ICD-10-CM | POA: Diagnosis not present

## 2016-12-04 DIAGNOSIS — E1165 Type 2 diabetes mellitus with hyperglycemia: Secondary | ICD-10-CM | POA: Diagnosis not present

## 2016-12-04 DIAGNOSIS — L97512 Non-pressure chronic ulcer of other part of right foot with fat layer exposed: Secondary | ICD-10-CM

## 2016-12-04 DIAGNOSIS — I959 Hypotension, unspecified: Secondary | ICD-10-CM

## 2016-12-04 DIAGNOSIS — L97509 Non-pressure chronic ulcer of other part of unspecified foot with unspecified severity: Secondary | ICD-10-CM

## 2016-12-04 DIAGNOSIS — G4733 Obstructive sleep apnea (adult) (pediatric): Secondary | ICD-10-CM

## 2016-12-04 DIAGNOSIS — E861 Hypovolemia: Secondary | ICD-10-CM | POA: Diagnosis not present

## 2016-12-04 DIAGNOSIS — L97415 Non-pressure chronic ulcer of right heel and midfoot with muscle involvement without evidence of necrosis: Secondary | ICD-10-CM | POA: Diagnosis not present

## 2016-12-04 LAB — CBC
HEMATOCRIT: 38.1 % (ref 36.0–46.0)
HEMOGLOBIN: 12.8 g/dL (ref 12.0–15.0)
MCH: 27.3 pg (ref 26.0–34.0)
MCHC: 33.6 g/dL (ref 30.0–36.0)
MCV: 81.2 fL (ref 78.0–100.0)
Platelets: 165 10*3/uL (ref 150–400)
RBC: 4.69 MIL/uL (ref 3.87–5.11)
RDW: 13.1 % (ref 11.5–15.5)
WBC: 4.3 10*3/uL (ref 4.0–10.5)

## 2016-12-04 LAB — COMPREHENSIVE METABOLIC PANEL
ALBUMIN: 3.2 g/dL — AB (ref 3.5–5.0)
ALT: 18 U/L (ref 14–54)
ANION GAP: 12 (ref 5–15)
AST: 22 U/L (ref 15–41)
Alkaline Phosphatase: 85 U/L (ref 38–126)
BILIRUBIN TOTAL: 0.7 mg/dL (ref 0.3–1.2)
BUN: 20 mg/dL (ref 6–20)
CO2: 22 mmol/L (ref 22–32)
Calcium: 8.8 mg/dL — ABNORMAL LOW (ref 8.9–10.3)
Chloride: 99 mmol/L — ABNORMAL LOW (ref 101–111)
Creatinine, Ser: 1.56 mg/dL — ABNORMAL HIGH (ref 0.44–1.00)
GFR calc non Af Amer: 35 mL/min — ABNORMAL LOW (ref >60)
GFR, EST AFRICAN AMERICAN: 40 mL/min — AB (ref >60)
GLUCOSE: 445 mg/dL — AB (ref 65–99)
POTASSIUM: 3.5 mmol/L (ref 3.5–5.1)
Sodium: 133 mmol/L — ABNORMAL LOW (ref 135–145)
TOTAL PROTEIN: 6.5 g/dL (ref 6.5–8.1)

## 2016-12-04 LAB — GLUCOSE, CAPILLARY
GLUCOSE-CAPILLARY: 454 mg/dL — AB (ref 65–99)
GLUCOSE-CAPILLARY: 327 mg/dL — AB (ref 65–99)
GLUCOSE-CAPILLARY: 305 mg/dL — AB (ref 65–99)
GLUCOSE-CAPILLARY: 170 mg/dL — AB (ref 65–99)
Glucose-Capillary: 222 mg/dL — ABNORMAL HIGH (ref 65–99)
Glucose-Capillary: 330 mg/dL — ABNORMAL HIGH (ref 65–99)

## 2016-12-04 LAB — CBG MONITORING, ED: Glucose-Capillary: 464 mg/dL — ABNORMAL HIGH (ref 65–99)

## 2016-12-04 MED ORDER — CIPROFLOXACIN IN D5W 400 MG/200ML IV SOLN
400.0000 mg | Freq: Two times a day (BID) | INTRAVENOUS | Status: DC
  Administered 2016-12-04: 400 mg via INTRAVENOUS
  Filled 2016-12-04 (×2): qty 200

## 2016-12-04 MED ORDER — LINEZOLID 600 MG/300ML IV SOLN: 600.0000 mg | Freq: Two times a day (BID) | INTRAVENOUS | Status: DC

## 2016-12-04 MED ORDER — TAPENTADOL HCL 50 MG PO TABS
50.0000 mg | ORAL_TABLET | Freq: Three times a day (TID) | ORAL | Status: DC
  Administered 2016-12-04 – 2016-12-05 (×4): 50 mg via ORAL
  Filled 2016-12-04 (×5): qty 1

## 2016-12-04 MED ORDER — LINEZOLID 600 MG/300ML IV SOLN
600.0000 mg | Freq: Two times a day (BID) | INTRAVENOUS | Status: DC
  Administered 2016-12-04 (×2): 600 mg via INTRAVENOUS
  Filled 2016-12-04 (×2): qty 300

## 2016-12-04 MED ORDER — GABAPENTIN 300 MG PO CAPS
300.0000 mg | ORAL_CAPSULE | Freq: Three times a day (TID) | ORAL | Status: DC
  Administered 2016-12-04 – 2016-12-05 (×5): 300 mg via ORAL
  Filled 2016-12-04 (×5): qty 1

## 2016-12-04 MED ORDER — BUSPIRONE HCL 5 MG PO TABS
15.0000 mg | ORAL_TABLET | Freq: Three times a day (TID) | ORAL | Status: DC
  Administered 2016-12-04 – 2016-12-05 (×5): 15 mg via ORAL
  Filled 2016-12-04 (×5): qty 1

## 2016-12-04 MED ORDER — TECHNETIUM TC 99M MEDRONATE IV KIT
25.0000 | PACK | Freq: Once | INTRAVENOUS | Status: AC | PRN
  Administered 2016-12-04: 25 via INTRAVENOUS

## 2016-12-04 MED ORDER — DOXYCYCLINE HYCLATE 100 MG PO TABS
100.0000 mg | ORAL_TABLET | Freq: Two times a day (BID) | ORAL | Status: DC
  Administered 2016-12-04 (×2): 100 mg via ORAL
  Filled 2016-12-04 (×2): qty 1

## 2016-12-04 MED ORDER — SULFAMETHOXAZOLE-TRIMETHOPRIM 800-160 MG PO TABS
2.0000 | ORAL_TABLET | Freq: Two times a day (BID) | ORAL | Status: DC
  Filled 2016-12-04: qty 2

## 2016-12-04 MED ORDER — INSULIN ASPART 100 UNIT/ML ~~LOC~~ SOLN: 0.0000 [IU] | SUBCUTANEOUS | Status: DC

## 2016-12-04 MED ORDER — TAPENTADOL HCL 50 MG PO TABS
50.0000 mg | ORAL_TABLET | ORAL | Status: AC
  Administered 2016-12-04: 50 mg via ORAL

## 2016-12-04 MED ORDER — SERTRALINE HCL 50 MG PO TABS
50.0000 mg | ORAL_TABLET | Freq: Every day | ORAL | Status: DC
  Administered 2016-12-04 – 2016-12-05 (×2): 50 mg via ORAL
  Filled 2016-12-04 (×2): qty 1

## 2016-12-04 MED ORDER — DIPHENHYDRAMINE HCL 25 MG PO CAPS
25.0000 mg | ORAL_CAPSULE | Freq: Four times a day (QID) | ORAL | Status: DC | PRN
  Administered 2016-12-04: 25 mg via ORAL
  Filled 2016-12-04: qty 1

## 2016-12-04 MED ORDER — INSULIN GLARGINE 100 UNIT/ML ~~LOC~~ SOLN
40.0000 [IU] | Freq: Two times a day (BID) | SUBCUTANEOUS | Status: DC
  Administered 2016-12-04 – 2016-12-05 (×3): 40 [IU] via SUBCUTANEOUS
  Filled 2016-12-04 (×4): qty 0.4

## 2016-12-04 MED ORDER — CARVEDILOL 25 MG PO TABS
25.0000 mg | ORAL_TABLET | Freq: Two times a day (BID) | ORAL | Status: DC
  Administered 2016-12-04 – 2016-12-05 (×3): 25 mg via ORAL
  Filled 2016-12-04 (×3): qty 1

## 2016-12-04 MED ORDER — HEPARIN SODIUM (PORCINE) 5000 UNIT/ML IJ SOLN
5000.0000 [IU] | Freq: Three times a day (TID) | INTRAMUSCULAR | Status: DC
  Administered 2016-12-04 – 2016-12-05 (×4): 5000 [IU] via SUBCUTANEOUS
  Filled 2016-12-04 (×4): qty 1

## 2016-12-04 MED ORDER — TIZANIDINE HCL 4 MG PO TABS
4.0000 mg | ORAL_TABLET | Freq: Three times a day (TID) | ORAL | Status: DC
  Administered 2016-12-04 – 2016-12-05 (×4): 4 mg via ORAL
  Filled 2016-12-04 (×4): qty 1

## 2016-12-04 MED ORDER — HYDROCHLOROTHIAZIDE 25 MG PO TABS
25.0000 mg | ORAL_TABLET | Freq: Every day | ORAL | Status: DC
  Administered 2016-12-04: 25 mg via ORAL
  Filled 2016-12-04: qty 1

## 2016-12-04 MED ORDER — INSULIN ASPART 100 UNIT/ML ~~LOC~~ SOLN
0.0000 [IU] | SUBCUTANEOUS | Status: DC
  Administered 2016-12-04: 15 [IU] via SUBCUTANEOUS
  Administered 2016-12-04: 25 [IU] via SUBCUTANEOUS
  Administered 2016-12-04: 15 [IU] via SUBCUTANEOUS
  Administered 2016-12-04: 4 [IU] via SUBCUTANEOUS
  Administered 2016-12-04: 7 [IU] via SUBCUTANEOUS
  Administered 2016-12-05: 20 [IU] via SUBCUTANEOUS
  Administered 2016-12-05: 11 [IU] via SUBCUTANEOUS
  Administered 2016-12-05: 7 [IU] via SUBCUTANEOUS

## 2016-12-04 NOTE — Progress Notes (Signed)
Spoke with Dr. Sharol Given over the phone. He recommended darco boot to help offload pressure. He recommended doxycycline upon discharge pending bone scan. He offered to see the patient Monday for possible debridement.  Olene Floss, MD [place], PGY-3

## 2016-12-04 NOTE — Progress Notes (Signed)
Family Medicine Teaching Service Daily Progress Note Intern Pager: [phone]  Patient name: [name] Medical record number: [id number] Date of birth: 1955/06/17 Age: 61 y.o. Gender: female  Primary Care Provider: Patient, No Pcp Per Consultants: Orthopedic Surgery, Wound Care Code Status: Full  Pt Overview and Major Events to Date:  11/1 - admitted for R diabetic foot ulcer concerning for osteomyelitis  Assessment and Plan: [name] is a 61 y.o. female presenting with R diabetic foot ulcer. PMH is significant for Type 2 diabetes, HTN, CKD stage 3, NASH, OSA w/ CPAP.  Diabetic foot ulcer Patient reports chronic ulcer on the forefoot at the first digit on her R foot with recent draining 24 hours prior to admission. Amputation of 2nd digit on L side in 2010 2/2 osteomyelitis and digits 2-4 on the R. Also with diabetic neuropathy. Currently has chronic 2.5x3.5cm ulcer with hyperkeratotic tissue around the periphery with some serosanguineous fluid in the center, tunneling ~2-12m below superficial border. There is no pain to palpation and no systemic signs or symptoms.  No radiologic findings of osteomyelitis seen on foot x-ray on admission.  S/p cipro and clindamycin in ED. - wound care consult - zyvox 60145mIV q12hrs (will consult ID) - ortho consult - bone scan of foot (patient has spinal stimulator and unable to have MRI) - diabetes management as below  Hyperglycemia with history of type 2 diabetes A1c 12.7 11/2016. Takes byetta 45m73mBID, novolog 10U TID with SSI, lantus 60U BID.  CBG greater than 600 without gap in ED.  Was given 10 of NovoLog in the ED and started on resistant SS. CBG this AM 320s 11/2.  - rSSI - Lantus 40U BID - Continue to monitor and adjust as appropriate  HTN Stable. SBP 100-130s.  -continue coreg 245m45mD daily -continue HCTZ 245mg67mly  CKD stage 3 Stable. Cr 1.56 on admission. -avoid nephrotoxic agents -continue to monitor  OSA "Lost"  her cpap in 2010. Apparently recently has titration study performed.  - consider cpap at night although patient refused during last admission  Chronic back pain Takes tapentadol 50mg 74mand zanaflex 4mg TI30mMultiple surgeries.  - continue current regimen  Anxiety Stable -continue buspar and zoloft  FEN/GI: Diabetic diet Prophylaxis: subcutaneous heparin  Disposition: Continue inpatient management of R diabetic foot ulcer  Subjective:  Patient feels ok, denies N/V, abdominal pain, foot pain. Afebrile.  Objective: Temp:  [97.6 F (36.4 C)-98.2 F (36.8 C)] 98.2 F (36.8 C) (11/02 0254) Pulse Rate:  [65-83] 82 (11/02 0254) Resp:  [15-16] 15 (11/02 0254) BP: (99-136)/(40-96) 99/40 (11/02 0254) SpO2:  [84 %-100 %] 99 % (11/02 0254) Weight:  [230 lb (104.3 kg)] 230 lb (104.3 kg) (11/01 1925)  Physical Exam: General: obese elderly female in NAD Cardiovascular: RRR, no murmurs/rubs/gallops Respiratory: CTA bilaterally, no wheezes or crackles Abdomen: soft, non tender to palpation Extremities: warm, dorsal pedal pulses not well appreciated. R forefoot ulcer with hyperkeratotic borders and tunneling ~2mm bel42msuperficial margin. Some serous fluid draining. Patch of erythema on RLL that is tender to touch, no increased warmth.  Laboratory:  Recent Labs Lab 12/03/16 1945 12/04/16 0417  WBC 4.8 4.3  HGB 12.0 12.8  HCT 36.5 38.1  PLT 172 165    Recent Labs Lab 12/03/16 1945 12/04/16 0417  NA 126* 133*  K 3.8 3.5  CL 94* 99*  CO2 24 22  BUN 22* 20  CREATININE 1.58* 1.56*  CALCIUM 8.7* 8.8*  PROT 6.4* 6.5  BILITOT 0.7  0.7  ALKPHOS 86 85  ALT 19 18  AST 22 22  GLUCOSE 638* 445*   A1c 12.7 11/23/16  Imaging/Diagnostic Tests:  R Foot Xray 11/1 FINDINGS: Prior resection of the second and fourth toe and amputation at the mid third proximal phalanx. The resection margins are smooth. Soft tissue defect about the plantar forefoot adjacent to the  metatarsal phalangeal joints, not localized on the AP view. No abnormal bone density, periosteal reaction or bony destructive change. No radiopaque foreign body or tracking soft tissue air. There is hallux valgus and degenerative change of the first metatarsal phalangeal joint. Hammertoe deformity of the fifth digit. Plantar calcaneal spur. Irregular coarse soft tissue calcifications at the Achilles tendon insertion and in the plantar hindfoot. IMPRESSION: Soft tissue defect about the plantar metatarsal phalangeal joints. No radiographic findings of osteomyelitis or radiopaque foreign body.  Rory Percy, DO 12/04/2016, 7:23 AM PGY-1, [place] Intern pager: [phone], text pages welcome

## 2016-12-04 NOTE — Consult Note (Signed)
[facility] Nurse wound consult note Reason for Consult: foot ulcer Pending bone scan to r/o osteomyelitis, xray negative  Patient with DM, has full thickness ulceration on the right plantar surface under the great toe. She has had multiple toe amputations and they are fully healed.  I have discussed with her footwear, she has custom made shoes for both feet. She noted and opening 2 weeks ago. Drainage with odor started yesterday.  Wound type: Neuropathic foot ulcer  Pressure Injury POA: NA Measurement:2.5cm x 3.0cm x 0.1cm  Wound bed: pale, pink, dry Drainage (amount, consistency, odor) none at the time of my assessment, but she does not have a dressing in place. She reported increasing drainage and odor yesterday Periwound: intact, with hyperkeratotic wound edges Dressing procedure/placement/frequency: Silver hydrofiber, moistened with small amount of saline, cut to fit the wound. Cover with dry dressing. Change every other day.  Needs follow up in a wound care center of her choice for serial debridements at DC.   Discussed POC with patient and bedside nurse.  Re consult if needed, will not follow at this time. Thanks  Cleatis Fandrich [employer], RN,CWOCN, CNS, [place] [phone])

## 2016-12-04 NOTE — Progress Notes (Signed)
Orthopedic Tech Progress Note Patient Details:  [name] [birth year] [id number]  Ortho Devices Type of Ortho Device: Postop shoe/boot Ortho Device/Splint Location: applied Darco Shoe to pt right foot as ordered.  pt stated to have warn a Darco shoe previously.  pt tolerated application very well.  Right foot.  Ortho Device/Splint Interventions: Application, Adjustment   [name] 12/04/2016, 4:08 PM

## 2016-12-04 NOTE — ED Notes (Signed)
Pt given [place] sandwich and diet sprite

## 2016-12-04 NOTE — Progress Notes (Signed)
Inpatient Diabetes Program Recommendations  AACE/ADA: New Consensus Statement on Inpatient Glycemic Control (2015)  Target Ranges:  Prepandial:   less than 140 mg/dL      Peak postprandial:   less than 180 mg/dL (1-2 hours)      Critically ill patients:  140 - 180 mg/dL   Lab Results  Component Value Date   GLUCAP 170 (H) 12/04/2016   HGBA1C 12.7 11/23/2016    Review of Glycemic Control Results for [name], Jamie (MRN [id number]) as of 12/04/2016 12:30  Ref. Range 12/04/2016 02:29 12/04/2016 04:03 12/04/2016 06:54 12/04/2016 08:04 12/04/2016 12:00  Glucose-Capillary Latest Ref Range: 65 - 99 mg/dL 464 (H) 454 (H) 327 (H) 305 (H) 170 (H)   Diabetes history: DM2 Outpatient Diabetes medications: Lantus 60 units bid + Novolog 10 units tid meal coverage Current orders for Inpatient glycemic control: Lantus 40 units bid + Novolog correction 0-20 units q 4 hrs.  Inpatient Diabetes Program Recommendations:    Please consider -Novolog 5 units tid meal coverage if eats 50%  Thank you, Nani Gasser. Areyana Leoni, RN, MSN, CDE  Diabetes Coordinator Inpatient Glycemic Control Team Team Pager [phone] (8am-5pm) 12/04/2016 12:37 PM

## 2016-12-04 NOTE — Progress Notes (Signed)
Spoke with on-call ID physician Dr. Baxter Flattery about bone scan results. She recommended cipro and bactrim x 2 weeks as empiric treatment with follow-up with ortho in the interim.   Jamie Floss, MD [place], PGY-3

## 2016-12-04 NOTE — Progress Notes (Signed)
Patient off floor to nuclear medicine.

## 2016-12-05 ENCOUNTER — Observation Stay (HOSPITAL_BASED_OUTPATIENT_CLINIC_OR_DEPARTMENT_OTHER): Payer: Medicare HMO

## 2016-12-05 ENCOUNTER — Encounter (HOSPITAL_COMMUNITY): Payer: Medicare HMO

## 2016-12-05 DIAGNOSIS — E11621 Type 2 diabetes mellitus with foot ulcer: Secondary | ICD-10-CM | POA: Diagnosis not present

## 2016-12-05 DIAGNOSIS — E1142 Type 2 diabetes mellitus with diabetic polyneuropathy: Secondary | ICD-10-CM | POA: Diagnosis not present

## 2016-12-05 DIAGNOSIS — L97415 Non-pressure chronic ulcer of right heel and midfoot with muscle involvement without evidence of necrosis: Secondary | ICD-10-CM | POA: Diagnosis not present

## 2016-12-05 LAB — GLUCOSE, CAPILLARY
GLUCOSE-CAPILLARY: 411 mg/dL — AB (ref 65–99)
GLUCOSE-CAPILLARY: 302 mg/dL — AB (ref 65–99)
Glucose-Capillary: 212 mg/dL — ABNORMAL HIGH (ref 65–99)
Glucose-Capillary: 286 mg/dL — ABNORMAL HIGH (ref 65–99)

## 2016-12-05 LAB — C-REACTIVE PROTEIN: CRP: 3.7 mg/dL — ABNORMAL HIGH (ref <1.0)

## 2016-12-05 LAB — SEDIMENTATION RATE: Sed Rate: 39 mm/hr — ABNORMAL HIGH (ref 0–22)

## 2016-12-05 MED ORDER — CLINDAMYCIN HCL 300 MG PO CAPS: 600.0000 mg | ORAL_CAPSULE | Freq: Three times a day (TID) | ORAL | 0 refills | Status: DC

## 2016-12-05 MED ORDER — METHOCARBAMOL 750 MG PO TABS: 750.0000 mg | ORAL_TABLET | Freq: Four times a day (QID) | ORAL | Status: DC | PRN

## 2016-12-05 MED ORDER — CIPROFLOXACIN HCL 500 MG PO TABS: 500.0000 mg | ORAL_TABLET | Freq: Two times a day (BID) | ORAL | 0 refills | Status: DC

## 2016-12-05 MED ORDER — METHOCARBAMOL 750 MG PO TABS: 750.0000 mg | ORAL_TABLET | Freq: Four times a day (QID) | ORAL | 1 refills | Status: DC | PRN

## 2016-12-05 MED ORDER — CIPROFLOXACIN HCL 500 MG PO TABS
500.0000 mg | ORAL_TABLET | Freq: Two times a day (BID) | ORAL | Status: DC
  Administered 2016-12-05: 500 mg via ORAL
  Filled 2016-12-05: qty 1

## 2016-12-05 NOTE — Progress Notes (Signed)
Pt being discharged home via wheelchair with family. Pt alert and oriented x4. VSS. Pt c/o no pain at this time. No signs of respiratory distress. Education complete and care plans resolved. IV removed with catheter intact and pt tolerated well. No further issues at this time. Pt to follow up with PCP. Krystal Delduca R, RN 

## 2016-12-05 NOTE — Progress Notes (Signed)
Family Medicine Teaching Service Daily Progress Note Intern Pager: [phone]  Patient name: [name] Medical record number: [id number] Date of birth: 15-Jun-1955 Age: 61 y.o. Gender: female  Primary Care Provider: Patient, No Pcp Per Consultants: Orthopedic Surgery, Wound Care Code Status: Full  Pt Overview and Major Events to Date:  11/1 - admitted for R diabetic foot ulcer concerning for osteomyelitis  Assessment and Plan: [name] is a 61 y.o. female presenting with R diabetic foot ulcer. PMH is significant for Type 2 diabetes, HTN, CKD stage 3, NASH, OSA w/ CPAP.  Diabetic foot ulcer Patient reports chronic ulcer on the forefoot at the first digit on her R foot with recent draining 24 hours prior to admission. Amputation of 2nd digit on L side in 2010 2/2 osteomyelitis and digits 2-4 on the R. Also with diabetic neuropathy. Chronic 2.5x3.5cm ulcer with hyperkeratotic tissue around the periphery with some serosanguineous fluid in the center, tunneling ~2-71m below superficial border. S/p cipro and clindamycin in ED; a dose of zyvox; 2 doses doxycycline. Bone scan 12/04/16 showed increased uptake around 1st great toe, concerning for osteomyelitis.  - wound care recommends outpatient debridement - discussed with Dr. DSharol Givenwho is willing to see patient Monday, 11/5. Recommends offloading pressure with darco boot and applying silvadene dry dressing and cleansing with soap and water daily.  - clinda and cipro x 2 weeks - ESR elevated at 39, CRP 3.7 - diabetes management as below - Will defer ABIs as patient has strong pedal pulses  Hyperglycemia with history of type 2 diabetes. CBGs > 600 in ED. Now in 200s.  A1c 12.7 11/2016. Takes byetta 53m BID, novolog 10U TID with SSI, lantus 60U BID. CBG greater than 600 without gap in ED.  - rSSI - Lantus 40U BID - Continue to monitor and adjust as appropriate  HTN Stable. SBP 100-130s.  -continue coreg 2536mID daily -continue  HCTZ 44m1mily  CKD stage 3. Stable. Baseline ~1.6.   OSA "Lost" her cpap in 2010. Apparently recently has titration study performed.  - consider cpap at night although patient refused during last admission  Chronic back pain Takes tapentadol 50mg66m and zanaflex 4mg T61m Multiple surgeries.  - switch zanaflex to robaxin while on cipro due to CYP1A2 interaction - continue current regimen  Anxiety Stable -continue buspar and zoloft  FEN/GI: Diabetic diet Prophylaxis: subcutaneous heparin  Disposition: Home with close outpatient follow-up.   Subjective:  Patient feels well. Notes some continued drainage. Able to walk without too much discomfort.   Objective: Temp:  [97.6 F (36.4 C)-98.5 F (36.9 C)] 98.5 F (36.9 C) (11/02 2208) Pulse Rate:  [65-66] 66 (11/02 2208) Resp:  [17-18] 17 (11/02 2208) BP: (103-135)/(57) 135/57 (11/02 2208) SpO2:  [96 %-99 %] 96 % (11/02 2208)  Physical Exam: General: obese elderly female in NAD, pleasant Cardiovascular: RRR, no murmurs/rubs/gallops Respiratory: CTA bilaterally, no wheezes or crackles Abdomen: soft, non tender to palpation Extremities: warm, dorsal pedal pulses easily palpable bilaterally. R forefoot ulcer with hyperkeratotic borders and tunneling about 2cm across. Some serous fluid draining. Patch of erythema on RLL previously demarcated barely discernable.  Laboratory:  Recent Labs Lab 12/03/16 1945 12/04/16 0417  WBC 4.8 4.3  HGB 12.0 12.8  HCT 36.5 38.1  PLT 172 165    Recent Labs Lab 12/03/16 1945 12/04/16 0417  NA 126* 133*  K 3.8 3.5  CL 94* 99*  CO2 24 22  BUN 22* 20  CREATININE 1.58* 1.56*  CALCIUM 8.7*  8.8*  PROT 6.4* 6.5  BILITOT 0.7 0.7  ALKPHOS 86 85  ALT 19 18  AST 22 22  GLUCOSE 638* 445*   A1c 12.7 11/23/16  Imaging/Diagnostic Tests:  R Foot Xray 11/1 FINDINGS: Prior resection of the second and fourth toe and amputation at the mid third proximal phalanx. The resection  margins are smooth. Soft tissue defect about the plantar forefoot adjacent to the metatarsal phalangeal joints, not localized on the AP view. No abnormal bone density, periosteal reaction or bony destructive change. No radiopaque foreign body or tracking soft tissue air. There is hallux valgus and degenerative change of the first metatarsal phalangeal joint. Hammertoe deformity of the fifth digit. Plantar calcaneal spur. Irregular coarse soft tissue calcifications at the Achilles tendon insertion and in the plantar hindfoot. IMPRESSION: Soft tissue defect about the plantar metatarsal phalangeal joints. No radiographic findings of osteomyelitis or radiopaque foreign Body.  Dg Foot 2 Views Right  Result Date: 12/03/2016 CLINICAL DATA:  Diabetic foot ulcer for 2 weeks. Site of ulcer not specified. EXAM: RIGHT FOOT - 2 VIEW COMPARISON:  None. FINDINGS: Prior resection of the second and fourth toe and amputation at the mid third proximal phalanx. The resection margins are smooth. Soft tissue defect about the plantar forefoot adjacent to the metatarsal phalangeal joints, not localized on the AP view. No abnormal bone density, periosteal reaction or bony destructive change. No radiopaque foreign body or tracking soft tissue air. There is hallux valgus and degenerative change of the first metatarsal phalangeal joint. Hammertoe deformity of the fifth digit. Plantar calcaneal spur. Irregular coarse soft tissue calcifications at the Achilles tendon insertion and in the plantar hindfoot. IMPRESSION: Soft tissue defect about the plantar metatarsal phalangeal joints. No radiographic findings of osteomyelitis or radiopaque foreign body. Electronically Signed   By: Jeb Levering M.D.   On: 12/03/2016 22:11   Nm Bone Scan 3 Phase Lower Extremity  Result Date: 12/04/2016 CLINICAL DATA:  Chronic nonhealing ulcer involving the great toe. Prior history of second, third and fourth toe amputations. EXAM: NUCLEAR  MEDICINE 3-PHASE BONE SCAN TECHNIQUE: Radionuclide angiographic images, immediate static blood pool images, and 3-hour delayed static images were obtained of the bilateral lower extremities after intravenous injection of radiopharmaceutical. RADIOPHARMACEUTICALS:  21.2 mCi Tc-38mMDP COMPARISON:  Radiographs 12/03/2016 FINDINGS: Vascular phase: Asymmetric increased blood flow to the right lower extremity Blood pool phase: Asymmetric uptake in the region of the great toe of the right foot. Delayed phase: Focal area of increased uptake in the region of the first MTP joint. Moderate areas of degenerative type uptake throughout the foot ankles bilaterally. IMPRESSION: Increased blood flow, positive immediate static images and corresponding focal area of increased uptake on the delayed images in the region of the first great toe most likely around the first MTP joint. Findings could be due to osteomyelitis or possibly an inflammatory arthropathy. Electronically Signed   By: PMarijo SanesM.D.   On: 12/04/2016 16:38   FRogue Bussing MD 12/05/2016, 6:55 AM PGY-3, [place] pager: [phone] text pages welcome

## 2016-12-05 NOTE — Progress Notes (Signed)
VASCULAR LAB PRELIMINARY  ARTERIAL  ABI completed: Normal ABI at rest bilaterally.    RIGHT    LEFT    PRESSURE WAVEFORM  PRESSURE WAVEFORM  BRACHIAL 106 triphasic BRACHIAL 112 triphasic  DP 146 triphasic DP 139 triphasic  AT   AT    PT 150 triphasic PT 142 triphasic  PER   PER    GREAT TOE 1.0 NA GREAT TOE  NA    RIGHT LEFT  ABI 1.3 1.2     Loleta Frommelt D, RVT 12/05/2016, 2:26 PM

## 2016-12-05 NOTE — Discharge Summary (Signed)
[facility] Discharge Summary  Patient name: [name] Medical record number: [id number] Date of birth: 02-Dec-1955 Age: 61 y.o. Gender: female Date of Admission: 12/03/2016  Date of Discharge: 12/06/2016 Admitting Physician: Blane Ohara McDiarmid, MD  Primary Care Provider: Patient, No Pcp Per Consultants: ID, Ortho (both by phone)  Indication for Hospitalization: diabetic foot infection  Discharge Diagnoses/Problem List:  Principal Problem:   Diabetic foot ulcer ([place]) Active Problems:   Chronic back pain   Kidney disease, chronic, stage III (GFR 30-59 ml/min) (HCC)   OSA on CPAP   Uncontrolled type 2 diabetes mellitus with diabetic polyneuropathy, with long-term current use of insulin (HCC)   Hypovolemia   Chronic pain syndrome   Diabetic peripheral neuropathy ([place])   Hypotension  Disposition: Home with close follow-up with Dr. Sharol Given 11/5  Discharge Condition: Stable, improved  Discharge Exam:  Blood pressure (!) 132/54, pulse 68, temperature 98.5 F (36.9 C), temperature source Oral, resp. rate 17, height _0  (1.676 m), weight 230 lb (104.3 kg), SpO2 98 %. Physical Exam: General: obese elderly female in NAD, pleasant Cardiovascular: RRR, no murmurs/rubs/gallops Respiratory: CTA bilaterally, no wheezes or crackles Abdomen: soft, non tender to palpation Extremities: warm, dorsal pedal pulses easily palpable bilaterally. R forefoot ulcer with hyperkeratotic borders and tunneling about 2cm across. Some serous fluid draining. Patch of erythema on RLL previously demarcated barely discernable.  Brief Hospital Course:  Patient admitted for new drainage and enlargement of chronic ulcer on plantar surface of first metatarsal head of right foot, concerning for osteomyelitis. She has history of osteomyelitis and toe amputations. Also with tender area of redness of R anterior tibia that did not communicate with foot. She was started on antibiotics; choices  limited by allergies. She was given zyvox then doxycycline on 11/2. She could not tolerate itching on doxycyline even with benadryl. Would Care recommended serial debridements. Ortho agreed to follow patient outpatient and recommended darco boot to offload pressure, as well as silvadene with dry dressing and cleansing daily. With bone scan (performed instead of MRI for implanted spinal device) showing increased uptake in region of first MTP joint, concern for osteomyelitis increased. ID recommended bactrim and cipro for 2 weeks, but she will not take sulfa antibiotics for severe nausea. Discharged patient on cipro 500 mg BID and clindamycin 300 mg TID for 2 weeks. Gave prescription for baclofen in place of tizanidine while patient on cipro. Of note, area of redness of R tibia improved on antibiotics, so suspect cellulitis. ABIs performed 11/3 were normal at 1.3 on the R and 1.2 on the L.   In addition, patient was hyperglycemic to >600 on admission. She had no gap, and CBGs improved to 200s by time of discharge on 40U lantus BID and resistant sliding scale insulin. She was discharged on her home regimen of byetta 5 mcg BID, novolog 10U TID with SSI, lantus 60U BID.   Issues for Follow Up:  1. Likely to benefit from serial debridements 2. Orthopedic surgery's reccommendations 3. Ability to tolerate antibiotics  4. Blood sugar control; consider increasing byetta.    Significant Procedures: None  Significant Labs and Imaging:  Recent Labs  Lab 12/03/16 1945 12/04/16 0417  WBC 4.8 4.3  HGB 12.0 12.8  HCT 36.5 38.1  PLT 172 165   Recent Labs  Lab 12/03/16 1945 12/04/16 0417  NA 126* 133*  K 3.8 3.5  CL 94* 99*  CO2 24 22  GLUCOSE 638* 445*  BUN 22* 20  CREATININE  1.58* 1.56*  CALCIUM 8.7* 8.8*  ALKPHOS 86 85  AST 22 22  ALT 19 18  ALBUMIN 3.2* 3.2*   12/06/2016: ESR: 39 CRP: 3.7  Results/Tests Pending at Time of Discharge: None  Dg Foot 2 Views Right  Result Date:  12/03/2016 CLINICAL DATA:  Diabetic foot ulcer for 2 weeks. Site of ulcer not specified. EXAM: RIGHT FOOT - 2 VIEW COMPARISON:  None. FINDINGS: Prior resection of the second and fourth toe and amputation at the mid third proximal phalanx. The resection margins are smooth. Soft tissue defect about the plantar forefoot adjacent to the metatarsal phalangeal joints, not localized on the AP view. No abnormal bone density, periosteal reaction or bony destructive change. No radiopaque foreign body or tracking soft tissue air. There is hallux valgus and degenerative change of the first metatarsal phalangeal joint. Hammertoe deformity of the fifth digit. Plantar calcaneal spur. Irregular coarse soft tissue calcifications at the Achilles tendon insertion and in the plantar hindfoot. IMPRESSION: Soft tissue defect about the plantar metatarsal phalangeal joints. No radiographic findings of osteomyelitis or radiopaque foreign body. Electronically Signed   By: Jeb Levering M.D.   On: 12/03/2016 22:11   Nm Bone Scan 3 Phase Lower Extremity  Result Date: 12/04/2016 CLINICAL DATA:  Chronic nonhealing ulcer involving the great toe. Prior history of second, third and fourth toe amputations. EXAM: NUCLEAR MEDICINE 3-PHASE BONE SCAN TECHNIQUE: Radionuclide angiographic images, immediate static blood pool images, and 3-hour delayed static images were obtained of the bilateral lower extremities after intravenous injection of radiopharmaceutical. RADIOPHARMACEUTICALS:  21.2 mCi Tc-44mMDP COMPARISON:  Radiographs 12/03/2016 FINDINGS: Vascular phase: Asymmetric increased blood flow to the right lower extremity Blood pool phase: Asymmetric uptake in the region of the great toe of the right foot. Delayed phase: Focal area of increased uptake in the region of the first MTP joint. Moderate areas of degenerative type uptake throughout the foot ankles bilaterally. IMPRESSION: Increased blood flow, positive immediate static images and  corresponding focal area of increased uptake on the delayed images in the region of the first great toe most likely around the first MTP joint. Findings could be due to osteomyelitis or possibly an inflammatory arthropathy. Electronically Signed   By: PMarijo SanesM.D.   On: 12/04/2016 16:38    Discharge Medications:  Allergies as of 12/05/2016      Reactions   Lasix [furosemide]    Dehydration leading to kidney failure    Norvasc [amlodipine Besylate]    Severe edema   Prednisone Other (See Comments)   Ketoacidosis   Vancomycin Other (See Comments)   Total organ shut down   Doxycycline Hives   Ibuprofen Other (See Comments)   WAS TOLD TO NOT TAKE THIS BECAUSE OF HER KIDNEYS AND LIVER   Inderal [propranolol] Cough   Lisinopril Other (See Comments)   WAS TOLD TO NOT TAKE THIS BECAUSE OF HER KIDNEYS AND LIVER   Lovastatin Other (See Comments)   WAS TOLD TO NOT TAKE THIS BECAUSE OF HER KIDNEYS AND LIVER   Norvasc [amlodipine Besylate] Other (See Comments)   Severe edema   Nsaids Other (See Comments)   WAS TOLD TO NOT TAKE THIS BECAUSE OF HER KIDNEYS AND LIVER   Sulfa Antibiotics Nausea Only      Medication List    STOP taking these medications   cephALEXin 500 MG capsule Commonly known as:  KEFLEX   tiZANidine 4 MG tablet Commonly known as:  ZANAFLEX     TAKE these medications  busPIRone 15 MG tablet Commonly known as:  BUSPAR Take 1 tablet (15 mg total) by mouth 3 (three) times daily.   butalbital-acetaminophen-caffeine 50-325-40 MG tablet Commonly known as:  FIORICET, ESGIC Take 1 tablet by mouth every 6 (six) hours as needed for headache.   carvedilol 25 MG tablet Commonly known as:  COREG Take 1 tablet (25 mg total) by mouth 2 (two) times daily.   ciprofloxacin 500 MG tablet Commonly known as:  CIPRO Take 1 tablet (500 mg total) by mouth 2 (two) times daily.   clindamycin 300 MG capsule Commonly known as:  CLEOCIN Take 2 capsules (600 mg total) by mouth 3  (three) times daily.   exenatide 5 MCG/0.02ML Sopn injection Commonly known as:  BYETTA 5 MCG PEN Inject 0.02 mLs (5 mcg total) into the skin 2 (two) times daily with a meal.   gabapentin 300 MG capsule Commonly known as:  NEURONTIN Take 300 mg by mouth 3 (three) times daily.   hydrochlorothiazide 25 MG tablet Commonly known as:  HYDRODIURIL Take 1 tablet (25 mg total) by mouth daily.   insulin aspart 100 UNIT/ML FlexPen Commonly known as:  NOVOLOG Inject 10 Units into the skin 3 (three) times daily before meals. "PER A SLIDING SCALE OF AN ADDITIONAL 2-10 UNITS" What changed:  Another medication with the same name was removed. Continue taking this medication, and follow the directions you see here.   LANTUS SOLOSTAR 100 UNIT/ML Solostar Pen Generic drug:  Insulin Glargine Inject 60 Units into the skin 2 (two) times daily. MORNING and BEDTIME   methocarbamol 750 MG tablet Commonly known as:  ROBAXIN Take 1 tablet (750 mg total) by mouth every 6 (six) hours as needed for muscle spasms.   ondansetron 4 MG tablet Commonly known as:  ZOFRAN Take 1 tablet (4 mg total) by mouth every 6 (six) hours.   sertraline 50 MG tablet Commonly known as:  ZOLOFT Take 1 tablet (50 mg total) by mouth daily.   tapentadol 50 MG tablet Commonly known as:  NUCYNTA Take 50 mg by mouth 3 (three) times daily.       Discharge Instructions: Please refer to Patient Instructions section of EMR for full details.  Patient was counseled important signs and symptoms that should prompt return to medical care, changes in medications, dietary instructions, activity restrictions, and follow up appointments.   Follow-Up Appointments: Follow-up Information    Newt Minion, MD. Schedule an appointment as soon as possible for a visit on 12/07/2016.   Specialty:  Orthopedic Surgery Why:  Please contact for appointment Monday, 11/5.  Contact information: [place]  [address] [phone]         To call for work-in appointment with Dr. Sharol Given 11/5 To call PCP for follow-up appointment  Rogue Bussing, MD 12/06/2016, 7:24 PM PGY-3, [place]

## 2016-12-05 NOTE — Discharge Instructions (Signed)
[name],  Please take ciprofloxacin 500 mg every 12 hours and clindamycin 600 mg three times daily for the next 2 weeks, or longer depending on Dr. Jess Barters recommendations. Please call Dr. Jess Barters office Monday to arrange appointment for possible debridement. Apply silvadene dressing with gauze over top daily. Cleanse foot with soapy water daily. Use darco boot and offload pressure using cane. Do not take tizanidine with ciprofloxacin, as this can cause dangerous levels of the muscle relaxant in the blood. I have prescribed baclofen (a different muscle relaxant) for you to take in the interim.   Please follow-up with your primary care doctor for continued adjustment of your diabetes medication.

## 2016-12-07 ENCOUNTER — Ambulatory Visit (INDEPENDENT_AMBULATORY_CARE_PROVIDER_SITE_OTHER): Payer: Medicare PPO | Admitting: Orthopedic Surgery

## 2016-12-07 ENCOUNTER — Encounter (INDEPENDENT_AMBULATORY_CARE_PROVIDER_SITE_OTHER): Payer: Self-pay | Admitting: Orthopedic Surgery

## 2016-12-07 ENCOUNTER — Ambulatory Visit: Payer: Medicare PPO | Admitting: Podiatry

## 2016-12-07 DIAGNOSIS — L97511 Non-pressure chronic ulcer of other part of right foot limited to breakdown of skin: Secondary | ICD-10-CM | POA: Diagnosis not present

## 2016-12-07 DIAGNOSIS — E1142 Type 2 diabetes mellitus with diabetic polyneuropathy: Secondary | ICD-10-CM | POA: Diagnosis not present

## 2016-12-07 NOTE — Progress Notes (Signed)
Office Visit Note   Patient: [name]           Date of Birth: 12-Aug-1955           MRN: [id number] Visit Date: 12/07/2016              Requested by: No referring provider defined for this encounter. PCP: Patient, No Pcp Per  Chief Complaint  Patient presents with  . Right Foot - Follow-up      HPI:  patient is a 61 year old woman who is seen for initial evaluation for ulceration beneath the first metatarsal head right foot.  Patient has had a history of osteomyelitis of the second third and fourth toes and has had these amputated.  She is currently using Aquacel dressing changes she is in a Darco shoe and was discharged from the hospital on Cipro and clindamycin.     Assesment & Plan: Visit Diagnoses:  1. Diabetic peripheral neuropathy ([place])   2. Non-pressure chronic ulcer of other part of right foot limited to breakdown of skin ([place])     Plan: We will place a felt bleeding donut beneath the first metatarsal head and the Darco shoe for the right foot.  She will use soap and water cleansing and dry dressing change daily with the Aquacel.  Discussed importance of nonweightbearing and that she should use the shoe for only minimum activities primarily she should ambulate in a wheelchair.  Follow-Up Instructions: Return in about 2 weeks (around 12/21/2016).   Ortho Exam  Patient is alert, oriented, no adenopathy, well-dressed, normal affect, normal respiratory effort. Examination patient has a palpable pulse she has good dorsiflexion of the ankle she has a Waggoner grade 1 ulcer beneath the first metatarsal head of the right foot.  After informed consent a 10 blade knife was used to debride the skin and soft tissue back to bleeding viable granulation tissue this was touched with silver nitrate for hemostasis a Band-Aid was applied.  The wound had 100% beefy granulation tissue the ulcer is 2 x 3 cm and 1 mm deep.  There is some redness around the MTP joint but no ascending  cellulitis.  Patient states that recently her glucose has been in the 600s and 400s.  Imaging: No results found. No images are attached to the encounter.  Labs: Lab Results  Component Value Date   HGBA1C 12.7 11/23/2016   HGBA1C 12.2 (H) 09/21/2016   ESRSEDRATE 39 (H) 12/05/2016   CRP 3.7 (H) 12/05/2016   REPTSTATUS 10/31/2016 FINAL 10/28/2016   CULT >=100,000 COLONIES/mL ESCHERICHIA COLI (A) 10/28/2016   LABORGA ESCHERICHIA COLI (A) 10/28/2016    Orders:  No orders of the defined types were placed in this encounter.  No orders of the defined types were placed in this encounter.    Procedures: No procedures performed  Clinical Data: No additional findings.  ROS:  All other systems negative, except as noted in the HPI. Review of Systems  Objective: Vital Signs: There were no vitals taken for this visit.  Specialty Comments:  No specialty comments available.  PMFS History: Patient Active Problem List   Diagnosis Date Noted  . Non-pressure chronic ulcer of other part of right foot limited to breakdown of skin ([place]) 12/07/2016  . Diabetic foot ulcer ([place]) 12/04/2016  . Hypovolemia 12/04/2016  . Chronic pain syndrome 12/04/2016  . Diabetic peripheral neuropathy ([place]) 12/04/2016  . Hypotension   . Liver mass 09/20/2016  . Kidney disease, chronic, stage III (GFR 30-59 ml/min) (HCC)  12/17/2015  . Uncontrolled type 2 diabetes mellitus with diabetic polyneuropathy, with long-term current use of insulin ([place]) 07/18/2015  . Essential hypertension 06/06/2015  . Chronic back pain 07/27/2012  . OSA on CPAP 03/21/2012   Past Medical History:  Diagnosis Date  . Allergy   . Arthritis   . Chronic kidney disease   . Chronic pain   . Coronary artery disease   . Diabetes mellitus without complication ([place])   . Dyspnea   . GERD (gastroesophageal reflux disease)   . Heart murmur   . Hypertension   . Liver disease   . NASH (nonalcoholic steatohepatitis) 09/16/2016  .  Obstructive sleep apnea   . Osteomyelitis ([place])   . Peripheral vascular disease ([place])   . Sleep apnea   . Stroke ([place])   . Ulcers of both great toes ([place]) 11/07/2016   right great toe, started as finger nail size and grew in 6 days     Family History  Problem Relation Age of Onset  . Heart disease Father   . Stroke Maternal Grandfather   . Heart disease Paternal Grandfather   . Cancer Mother        Thymoma, metastatic  . Stroke Father   . Multiple sclerosis Sister   . Epilepsy Sister     Past Surgical History:  Procedure Laterality Date  . ABDOMINAL HYSTERECTOMY    . BACK SURGERY    . CESAREAN SECTION    . CHOLECYSTECTOMY    . HERNIA REPAIR    . JOINT REPLACEMENT    . KNEE SURGERY    . SPINE SURGERY    . TOE AMPUTATION    . TOE AMPUTATION Bilateral    3 on right foot 2 left foot   . TONSILLECTOMY    . TUBAL LIGATION     Social History   Occupational History  . Not on file  Tobacco Use  . Smoking status: Never Smoker  . Smokeless tobacco: Never Used  Substance and Sexual Activity  . Alcohol use: No  . Drug use: No  . Sexual activity: No

## 2016-12-11 ENCOUNTER — Encounter: Payer: Self-pay | Admitting: Podiatry

## 2016-12-11 ENCOUNTER — Ambulatory Visit (INDEPENDENT_AMBULATORY_CARE_PROVIDER_SITE_OTHER): Payer: Medicare PPO | Admitting: Podiatry

## 2016-12-11 ENCOUNTER — Other Ambulatory Visit: Payer: Self-pay | Admitting: Podiatry

## 2016-12-11 ENCOUNTER — Ambulatory Visit: Payer: Medicare PPO | Admitting: Podiatry

## 2016-12-11 ENCOUNTER — Ambulatory Visit (INDEPENDENT_AMBULATORY_CARE_PROVIDER_SITE_OTHER): Payer: Medicare HMO

## 2016-12-11 VITALS — BP 127/77 | HR 79

## 2016-12-11 DIAGNOSIS — L97511 Non-pressure chronic ulcer of other part of right foot limited to breakdown of skin: Secondary | ICD-10-CM

## 2016-12-11 DIAGNOSIS — L97401 Non-pressure chronic ulcer of unspecified heel and midfoot limited to breakdown of skin: Secondary | ICD-10-CM | POA: Diagnosis not present

## 2016-12-11 DIAGNOSIS — E08621 Diabetes mellitus due to underlying condition with foot ulcer: Secondary | ICD-10-CM | POA: Diagnosis not present

## 2016-12-11 DIAGNOSIS — M79671 Pain in right foot: Secondary | ICD-10-CM

## 2016-12-11 NOTE — Progress Notes (Signed)
   Subjective:    Patient ID: [name], female    DOB: 05-31-1955, 61 y.o.   MRN: [id number]  HPI    Review of Systems  All other systems reviewed and are negative.      Objective:   Physical Exam        Assessment & Plan:

## 2016-12-14 ENCOUNTER — Ambulatory Visit: Payer: Medicare PPO | Admitting: Podiatry

## 2016-12-14 NOTE — Progress Notes (Signed)
Subjective:    Patient ID: [name], female   DOB: 61 y.o.   MRN: [id number]   HPI patient is currently seen Dr. Sharol Given for a plantar ulceration right and was concerned about change and just wanted to have it checked. Patient is running and high A1c sugar that is relatively out-of-control    Review of Systems  All other systems reviewed and are negative.       Objective:  Physical Exam  Constitutional: She appears well-developed and well-nourished.  Cardiovascular: Intact distal pulses.  Pulmonary/Chest: Effort normal.  Musculoskeletal: Normal range of motion.  Neurological: She is alert.  Skin: Skin is warm and dry.  Nursing note and vitals reviewed.  neurovascular status was found to be intact with moderate diminishment of sharp [place]ole vibratory with neurological implications. There is a breakdown of tissue plantar aspect right first metatarsal which is localized with no proximal edema erythema and also on the fifth digit on the inside of the toe and it's localized with no subcutaneous exposure. There is a superficial exposure on the plantar right and there is some tissue which needs to be debrided but there is no proximal signs of infection     Assessment:    At risk diabetic who is running high A1c's and is not taking care of herself properly with plantar right done of tissue right and fifth digit right with a blister which has popped with healthy tissue underneath     Plan:   H&P and x-rays reviewed. Today using sterile instrumentation I debrided tissue plantar right flushed applied medication and she will continue with home topical medicines and offloading with a wedge shoe that she's currently wearing. The fifth digit I cleaned that up and did not note any exposure and it should heal uneventfully and she will see Dr. Sharol Given for her regular appointment and is instructed if she should develop any proximal redness or systemic signs of infection to go straight to the emergency room  and she does understand she is at risk for amputation  X-rays are negative for signs of bony changes or indications of ostial lysis

## 2016-12-15 ENCOUNTER — Telehealth: Payer: Self-pay | Admitting: General Practice

## 2016-12-15 NOTE — Telephone Encounter (Signed)
Copied from [place] [id number]. Topic: Quick Communication - See Telephone Encounter >> Dec 15, 2016 11:01 AM Corie Chiquito, NT wrote: CRM for notification. See Telephone encounter for: Patient needs a refill on her Nucynta, Gabapentin, and Zofran by the end of this month. Nucynta needs a hard copy of the medication. Patient has an appointment with Dr. Nolon Rod on the 26 of this month. Pain Clinic that Dr.Stallings referred her to is moving and aren't seeing patients until after the new year.  12/15/16.

## 2016-12-15 NOTE — Telephone Encounter (Signed)
Please see refill requests

## 2016-12-15 NOTE — Telephone Encounter (Signed)
Please advise 

## 2016-12-18 ENCOUNTER — Ambulatory Visit (INDEPENDENT_AMBULATORY_CARE_PROVIDER_SITE_OTHER): Payer: Medicare PPO | Admitting: Family

## 2016-12-18 ENCOUNTER — Encounter (INDEPENDENT_AMBULATORY_CARE_PROVIDER_SITE_OTHER): Payer: Self-pay | Admitting: Family

## 2016-12-18 VITALS — Ht 66.0 in | Wt 230.0 lb

## 2016-12-18 DIAGNOSIS — Z794 Long term (current) use of insulin: Secondary | ICD-10-CM | POA: Diagnosis not present

## 2016-12-18 DIAGNOSIS — L97511 Non-pressure chronic ulcer of other part of right foot limited to breakdown of skin: Secondary | ICD-10-CM | POA: Diagnosis not present

## 2016-12-18 DIAGNOSIS — E1142 Type 2 diabetes mellitus with diabetic polyneuropathy: Secondary | ICD-10-CM | POA: Diagnosis not present

## 2016-12-18 DIAGNOSIS — IMO0002 Reserved for concepts with insufficient information to code with codable children: Secondary | ICD-10-CM

## 2016-12-18 DIAGNOSIS — E1165 Type 2 diabetes mellitus with hyperglycemia: Secondary | ICD-10-CM

## 2016-12-18 NOTE — Progress Notes (Signed)
Office Visit Note   Patient: [name]           Date of Birth: 1955/10/17           MRN: [id number] Visit Date: 12/18/2016              Requested by: No referring provider defined for this encounter. PCP: Patient, No Pcp Per  Chief Complaint  Patient presents with  . Right Foot - Open Wound      HPI: Patient is a 61 year old woman who is seen in follow up for evaluation for ulceration beneath the first metatarsal head right foot.  Patient has had a history of osteomyelitis of the second third and fourth toes and has had these amputated.  She is currently using Aquacel dressing changes she is in a Darco shoe.  Has completed the hospital discharge courses of Cipro and clindamycin.   Complains of new ulceration of the 5th toe, same foot. States blistered, unsure why this happened. Has not been dressing this.   Is out of aquacell, requests supplies.   Assesment & Plan: Visit Diagnoses:  No diagnosis found.  Plan: Continue the felt pressure relieving donut beneath the first metatarsal head and the Darco shoe for the right foot.  She will use soap and water cleansing and dry dressing change daily with the dressing materials provided today. [place] to contact patient and provide supplies. Discussed importance of nonweightbearing and that she should use the shoe for only minimum activities primarily she should ambulate in a wheelchair.  Follow-Up Instructions: No Follow-up on file.   Ortho Exam  Patient is alert, oriented, no adenopathy, well-dressed, normal affect, normal respiratory effort. Examination patient has a palpable pulse she has good dorsiflexion of the ankle she has a Wagner grade 1 ulcer beneath the first metatarsal head of the right foot.  After informed consent a 10 blade knife was used to debride the skin and soft tissue back to bleeding viable granulation tissue this was touched with silver nitrate for hemostasis a Band-Aid was applied.  The wound had 80% beefy  granulation tissue the ulcer is 18 x 11 mm and 1 mm deep.  There is resolving redness and peeling skin around the MTP joint but no ascending cellulitis. Maceration in 4th webspace. Ulceration to medial aspect of 5th toe. Ulcer 18 x 22 mm and 1 mm deep.   Moderate drainage on dressings has been saturating through aquacell and gauze dressings.    Patient states that recently her glucose has been in the 600s and 400s.  Imaging: No results found. No images are attached to the encounter.  Labs: Lab Results  Component Value Date   HGBA1C 12.7 11/23/2016   HGBA1C 12.2 (H) 09/21/2016   ESRSEDRATE 39 (H) 12/05/2016   CRP 3.7 (H) 12/05/2016   REPTSTATUS 10/31/2016 FINAL 10/28/2016   CULT >=100,000 COLONIES/mL ESCHERICHIA COLI (A) 10/28/2016   LABORGA ESCHERICHIA COLI (A) 10/28/2016    Orders:  No orders of the defined types were placed in this encounter.  No orders of the defined types were placed in this encounter.    Procedures: No procedures performed  Clinical Data: No additional findings.  ROS:  All other systems negative, except as noted in the HPI. Review of Systems  Constitutional: Negative for chills and fever.  Skin: Positive for wound. Negative for color change.    Objective: Vital Signs: Ht 5' 6"  (1.676 m)   Wt 230 lb (104.3 kg)   BMI 37.12 kg/m  Specialty Comments:  No specialty comments available.  PMFS History: Patient Active Problem List   Diagnosis Date Noted  . Non-pressure chronic ulcer of other part of right foot limited to breakdown of skin ([place]) 12/07/2016  . Diabetic foot ulcer ([place]) 12/04/2016  . Hypovolemia 12/04/2016  . Chronic pain syndrome 12/04/2016  . Diabetic peripheral neuropathy ([place]) 12/04/2016  . Hypotension   . Liver mass 09/20/2016  . Kidney disease, chronic, stage III (GFR 30-59 ml/min) (HCC) 12/17/2015  . Uncontrolled type 2 diabetes mellitus with diabetic polyneuropathy, with long-term current use of insulin ([place]) 07/18/2015   . Essential hypertension 06/06/2015  . Chronic back pain 07/27/2012  . OSA on CPAP 03/21/2012   Past Medical History:  Diagnosis Date  . Allergy   . Arthritis   . Chronic kidney disease   . Chronic pain   . Coronary artery disease   . Diabetes mellitus without complication ([place])   . Dyspnea   . GERD (gastroesophageal reflux disease)   . Heart murmur   . Hypertension   . Liver disease   . NASH (nonalcoholic steatohepatitis) 09/16/2016  . Obstructive sleep apnea   . Osteomyelitis ([place])   . Peripheral vascular disease ([place])   . Sleep apnea   . Stroke ([place])   . Ulcers of both great toes ([place]) 11/07/2016   right great toe, started as finger nail size and grew in 6 days     Family History  Problem Relation Age of Onset  . Heart disease Father   . Stroke Maternal Grandfather   . Heart disease Paternal Grandfather   . Cancer Mother        Thymoma, metastatic  . Stroke Father   . Multiple sclerosis Sister   . Epilepsy Sister     Past Surgical History:  Procedure Laterality Date  . ABDOMINAL HYSTERECTOMY    . BACK SURGERY    . CESAREAN SECTION    . CHOLECYSTECTOMY    . HERNIA REPAIR    . JOINT REPLACEMENT    . KNEE SURGERY    . SPINE SURGERY    . TOE AMPUTATION    . TOE AMPUTATION Bilateral    3 on right foot 2 left foot   . TONSILLECTOMY    . TUBAL LIGATION     Social History   Occupational History  . Not on file  Tobacco Use  . Smoking status: Never Smoker  . Smokeless tobacco: Never Used  Substance and Sexual Activity  . Alcohol use: No  . Drug use: No  . Sexual activity: No

## 2016-12-21 ENCOUNTER — Ambulatory Visit (INDEPENDENT_AMBULATORY_CARE_PROVIDER_SITE_OTHER): Payer: Medicare PPO | Admitting: Orthopedic Surgery
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
## 2016-12-28 ENCOUNTER — Encounter: Payer: Self-pay | Admitting: Family Medicine

## 2016-12-28 ENCOUNTER — Other Ambulatory Visit: Payer: Self-pay

## 2016-12-28 ENCOUNTER — Ambulatory Visit (INDEPENDENT_AMBULATORY_CARE_PROVIDER_SITE_OTHER): Payer: Medicare HMO | Admitting: Family Medicine

## 2016-12-28 VITALS — BP 100/68 | HR 97 | Temp 98.3°F | Resp 18 | Ht 66.0 in | Wt 232.6 lb

## 2016-12-28 DIAGNOSIS — G894 Chronic pain syndrome: Secondary | ICD-10-CM | POA: Diagnosis not present

## 2016-12-28 DIAGNOSIS — E1165 Type 2 diabetes mellitus with hyperglycemia: Secondary | ICD-10-CM

## 2016-12-28 DIAGNOSIS — E08621 Diabetes mellitus due to underlying condition with foot ulcer: Secondary | ICD-10-CM

## 2016-12-28 DIAGNOSIS — M549 Dorsalgia, unspecified: Secondary | ICD-10-CM | POA: Diagnosis not present

## 2016-12-28 DIAGNOSIS — Z9181 History of falling: Secondary | ICD-10-CM

## 2016-12-28 DIAGNOSIS — R2681 Unsteadiness on feet: Secondary | ICD-10-CM

## 2016-12-28 DIAGNOSIS — E1142 Type 2 diabetes mellitus with diabetic polyneuropathy: Secondary | ICD-10-CM

## 2016-12-28 DIAGNOSIS — IMO0002 Reserved for concepts with insufficient information to code with codable children: Secondary | ICD-10-CM

## 2016-12-28 DIAGNOSIS — Z794 Long term (current) use of insulin: Secondary | ICD-10-CM

## 2016-12-28 DIAGNOSIS — L97512 Non-pressure chronic ulcer of other part of right foot with fat layer exposed: Secondary | ICD-10-CM | POA: Diagnosis not present

## 2016-12-28 DIAGNOSIS — G8929 Other chronic pain: Secondary | ICD-10-CM

## 2016-12-28 MED ORDER — TAPENTADOL HCL 50 MG PO TABS: 50.0000 mg | ORAL_TABLET | Freq: Three times a day (TID) | ORAL | 0 refills | Status: DC

## 2016-12-28 MED ORDER — GABAPENTIN 300 MG PO CAPS: 300.0000 mg | ORAL_CAPSULE | Freq: Three times a day (TID) | ORAL | 3 refills | Status: DC

## 2016-12-28 NOTE — Telephone Encounter (Signed)
Refilled Nucynta in the office.

## 2016-12-28 NOTE — Patient Instructions (Addendum)
IF you received an x-ray today, you will receive an invoice from [facility]. Please contact [facility] at [phone] with questions or concerns regarding your invoice.   IF you received labwork today, you will receive an invoice from [facility]. Please contact [facility] at [phone] with questions or concerns regarding your invoice.   Our billing staff will not be able to assist you with questions regarding bills from these companies.  You will be contacted with the lab results as soon as they are available. The fastest way to get your results is to activate your My Chart account. Instructions are located on the last page of this paperwork. If you have not heard from [place] regarding the results in 2 weeks, please contact this office.     Fall Prevention in the Home Falls can cause injuries. They can happen to people of all ages. There are many things you can do to make your home safe and to help prevent falls. What can I do on the outside of my home?  Regularly fix the edges of walkways and driveways and fix any cracks.  Remove anything that might make you trip as you walk through a door, such as a raised step or threshold.  Trim any bushes or trees on the path to your home.  Use bright outdoor lighting.  Clear any walking paths of anything that might make someone trip, such as rocks or tools.  Regularly check to see if handrails are loose or broken. Make sure that both sides of any steps have handrails.  Any raised decks and porches should have guardrails on the edges.  Have any leaves, snow, or ice cleared regularly.  Use sand or salt on walking paths during winter.  Clean up any spills in your garage right away. This includes oil or grease spills. What can I do in the bathroom?  Use night lights.  Install grab bars by the toilet and in the tub and shower. Do not use towel bars as grab bars.  Use non-skid mats or decals in the tub or shower.  If  you need to sit down in the shower, use a plastic, non-slip stool.  Keep the floor dry. Clean up any water that spills on the floor as soon as it happens.  Remove soap buildup in the tub or shower regularly.  Attach bath mats securely with double-sided non-slip rug tape.  Do not have throw rugs and other things on the floor that can make you trip. What can I do in the bedroom?  Use night lights.  Make sure that you have a light by your bed that is easy to reach.  Do not use any sheets or blankets that are too big for your bed. They should not hang down onto the floor.  Have a firm chair that has side arms. You can use this for support while you get dressed.  Do not have throw rugs and other things on the floor that can make you trip. What can I do in the kitchen?  Clean up any spills right away.  Avoid walking on wet floors.  Keep items that you use a lot in easy-to-reach places.  If you need to reach something above you, use a strong step stool that has a grab bar.  Keep electrical cords out of the way.  Do not use floor polish or wax that makes floors slippery. If you must use wax, use non-skid floor wax.  Do not have throw  rugs and other things on the floor that can make you trip. What can I do with my stairs?  Do not leave any items on the stairs.  Make sure that there are handrails on both sides of the stairs and use them. Fix handrails that are broken or loose. Make sure that handrails are as long as the stairways.  Check any carpeting to make sure that it is firmly attached to the stairs. Fix any carpet that is loose or worn.  Avoid having throw rugs at the top or bottom of the stairs. If you do have throw rugs, attach them to the floor with carpet tape.  Make sure that you have a light switch at the top of the stairs and the bottom of the stairs. If you do not have them, ask someone to add them for you. What else can I do to help prevent falls?  Wear shoes  that: ? Do not have high heels. ? Have rubber bottoms. ? Are comfortable and fit you well. ? Are closed at the toe. Do not wear sandals.  If you use a stepladder: ? Make sure that it is fully opened. Do not climb a closed stepladder. ? Make sure that both sides of the stepladder are locked into place. ? Ask someone to hold it for you, if possible.  Clearly mark and make sure that you can see: ? Any grab bars or handrails. ? First and last steps. ? Where the edge of each step is.  Use tools that help you move around (mobility aids) if they are needed. These include: ? Canes. ? Walkers. ? Scooters. ? Crutches.  Turn on the lights when you go into a dark area. Replace any light bulbs as soon as they burn out.  Set up your furniture so you have a clear path. Avoid moving your furniture around.  If any of your floors are uneven, fix them.  If there are any pets around you, be aware of where they are.  Review your medicines with your doctor. Some medicines can make you feel dizzy. This can increase your chance of falling. Ask your doctor what other things that you can do to help prevent falls. This information is not intended to replace advice given to you by your health care provider. Make sure you discuss any questions you have with your health care provider. Document Released: 11/15/2008 Document Revised: 06/27/2015 Document Reviewed: 02/23/2014 Elsevier Interactive Patient Education  [employer].

## 2016-12-31 ENCOUNTER — Telehealth: Payer: Self-pay | Admitting: Family Medicine

## 2016-12-31 NOTE — Telephone Encounter (Signed)
Copied from [place]. Topic: Quick Communication - See Telephone Encounter >> Dec 31, 2016  2:20 PM Vernona Rieger wrote: CRM for notification. See Telephone encounter for:   Refill novolog. Pt states she only has 50 units left on the one she has. Pharmacy is [facility] on [address]  12/31/16.

## 2017-01-01 ENCOUNTER — Encounter (HOSPITAL_COMMUNITY): Payer: Self-pay | Admitting: Oncology

## 2017-01-01 ENCOUNTER — Emergency Department (HOSPITAL_COMMUNITY): Payer: Medicare HMO

## 2017-01-01 ENCOUNTER — Other Ambulatory Visit: Payer: Self-pay

## 2017-01-01 ENCOUNTER — Observation Stay (HOSPITAL_COMMUNITY)
Admission: EM | Admit: 2017-01-01 | Discharge: 2017-01-04 | Disposition: A | Discharge status: Home / Self Care | Payer: Medicare HMO | Attending: Family Medicine | Admitting: Family Medicine

## 2017-01-01 ENCOUNTER — Telehealth: Payer: Self-pay | Admitting: Family Medicine

## 2017-01-01 DIAGNOSIS — G4733 Obstructive sleep apnea (adult) (pediatric): Secondary | ICD-10-CM | POA: Insufficient documentation

## 2017-01-01 DIAGNOSIS — G459 Transient cerebral ischemic attack, unspecified: Secondary | ICD-10-CM | POA: Diagnosis not present

## 2017-01-01 DIAGNOSIS — E119 Type 2 diabetes mellitus without complications: Secondary | ICD-10-CM

## 2017-01-01 DIAGNOSIS — E782 Mixed hyperlipidemia: Secondary | ICD-10-CM

## 2017-01-01 DIAGNOSIS — R531 Weakness: Secondary | ICD-10-CM | POA: Insufficient documentation

## 2017-01-01 DIAGNOSIS — E1122 Type 2 diabetes mellitus with diabetic chronic kidney disease: Secondary | ICD-10-CM | POA: Diagnosis not present

## 2017-01-01 DIAGNOSIS — I2510 Atherosclerotic heart disease of native coronary artery without angina pectoris: Secondary | ICD-10-CM | POA: Insufficient documentation

## 2017-01-01 DIAGNOSIS — Z7902 Long term (current) use of antithrombotics/antiplatelets: Secondary | ICD-10-CM | POA: Insufficient documentation

## 2017-01-01 DIAGNOSIS — I1310 Hypertensive heart and chronic kidney disease without heart failure, with stage 1 through stage 4 chronic kidney disease, or unspecified chronic kidney disease: Secondary | ICD-10-CM | POA: Diagnosis not present

## 2017-01-01 DIAGNOSIS — R2990 Unspecified symptoms and signs involving the nervous system: Secondary | ICD-10-CM | POA: Diagnosis present

## 2017-01-01 DIAGNOSIS — G43409 Hemiplegic migraine, not intractable, without status migrainosus: Secondary | ICD-10-CM

## 2017-01-01 DIAGNOSIS — E041 Nontoxic single thyroid nodule: Secondary | ICD-10-CM | POA: Diagnosis not present

## 2017-01-01 DIAGNOSIS — Z794 Long term (current) use of insulin: Secondary | ICD-10-CM | POA: Diagnosis not present

## 2017-01-01 DIAGNOSIS — I69392 Facial weakness following cerebral infarction: Secondary | ICD-10-CM | POA: Diagnosis not present

## 2017-01-01 DIAGNOSIS — Z79899 Other long term (current) drug therapy: Secondary | ICD-10-CM | POA: Diagnosis not present

## 2017-01-01 DIAGNOSIS — R2689 Other abnormalities of gait and mobility: Secondary | ICD-10-CM | POA: Insufficient documentation

## 2017-01-01 DIAGNOSIS — R42 Dizziness and giddiness: Secondary | ICD-10-CM | POA: Insufficient documentation

## 2017-01-01 DIAGNOSIS — G43909 Migraine, unspecified, not intractable, without status migrainosus: Secondary | ICD-10-CM | POA: Diagnosis not present

## 2017-01-01 DIAGNOSIS — F418 Other specified anxiety disorders: Secondary | ICD-10-CM | POA: Insufficient documentation

## 2017-01-01 DIAGNOSIS — G43109 Migraine with aura, not intractable, without status migrainosus: Secondary | ICD-10-CM

## 2017-01-01 DIAGNOSIS — N183 Chronic kidney disease, stage 3 unspecified: Secondary | ICD-10-CM

## 2017-01-01 DIAGNOSIS — I700 Atherosclerosis of aorta: Secondary | ICD-10-CM | POA: Diagnosis not present

## 2017-01-01 DIAGNOSIS — E1165 Type 2 diabetes mellitus with hyperglycemia: Secondary | ICD-10-CM | POA: Insufficient documentation

## 2017-01-01 DIAGNOSIS — Z881 Allergy status to other antibiotic agents status: Secondary | ICD-10-CM | POA: Diagnosis not present

## 2017-01-01 LAB — I-STAT TROPONIN, ED: TROPONIN I, POC: 0 ng/mL (ref 0.00–0.08)

## 2017-01-01 LAB — COMPREHENSIVE METABOLIC PANEL
ALT: 18 U/L (ref 14–54)
ANION GAP: 9 (ref 5–15)
AST: 23 U/L (ref 15–41)
Albumin: 2.9 g/dL — ABNORMAL LOW (ref 3.5–5.0)
Alkaline Phosphatase: 84 U/L (ref 38–126)
BUN: 17 mg/dL (ref 6–20)
CHLORIDE: 97 mmol/L — AB (ref 101–111)
CO2: 26 mmol/L (ref 22–32)
Calcium: 8.5 mg/dL — ABNORMAL LOW (ref 8.9–10.3)
Creatinine, Ser: 1.59 mg/dL — ABNORMAL HIGH (ref 0.44–1.00)
GFR, EST AFRICAN AMERICAN: 39 mL/min — AB (ref >60)
GFR, EST NON AFRICAN AMERICAN: 34 mL/min — AB (ref >60)
Glucose, Bld: 480 mg/dL — ABNORMAL HIGH (ref 65–99)
POTASSIUM: 3.4 mmol/L — AB (ref 3.5–5.1)
SODIUM: 132 mmol/L — AB (ref 135–145)
Total Bilirubin: 0.6 mg/dL (ref 0.3–1.2)
Total Protein: 6 g/dL — ABNORMAL LOW (ref 6.5–8.1)

## 2017-01-01 LAB — URINALYSIS, ROUTINE W REFLEX MICROSCOPIC
BACTERIA UA: NONE SEEN
Bilirubin Urine: NEGATIVE
Glucose, UA: >=500 mg/dL — AB
HGB URINE DIPSTICK: NEGATIVE
Ketones, ur: NEGATIVE mg/dL
Leukocytes, UA: NEGATIVE
Nitrite: NEGATIVE
PROTEIN: NEGATIVE mg/dL
SPECIFIC GRAVITY, URINE: 1.013 (ref 1.005–1.030)
pH: 6 (ref 5.0–8.0)

## 2017-01-01 LAB — RAPID URINE DRUG SCREEN, HOSP PERFORMED
AMPHETAMINES: NOT DETECTED
BENZODIAZEPINES: NOT DETECTED
Barbiturates: NOT DETECTED
COCAINE: NOT DETECTED
OPIATES: NOT DETECTED
Tetrahydrocannabinol: NOT DETECTED

## 2017-01-01 LAB — CBC
HEMATOCRIT: 35.8 % — AB (ref 36.0–46.0)
Hemoglobin: 11.8 g/dL — ABNORMAL LOW (ref 12.0–15.0)
MCH: 27 pg (ref 26.0–34.0)
MCHC: 33 g/dL (ref 30.0–36.0)
MCV: 81.9 fL (ref 78.0–100.0)
PLATELETS: 125 10*3/uL — AB (ref 150–400)
RBC: 4.37 MIL/uL (ref 3.87–5.11)
RDW: 13.3 % (ref 11.5–15.5)
WBC: 3.4 10*3/uL — AB (ref 4.0–10.5)

## 2017-01-01 LAB — PROTIME-INR
INR: 1.03
PROTHROMBIN TIME: 13.5 s (ref 11.4–15.2)

## 2017-01-01 LAB — DIFFERENTIAL
BASOS PCT: 0 %
Basophils Absolute: 0 10*3/uL (ref 0.0–0.1)
EOS ABS: 0 10*3/uL (ref 0.0–0.7)
EOS PCT: 1 %
Lymphocytes Relative: 28 %
Lymphs Abs: 0.9 10*3/uL (ref 0.7–4.0)
MONO ABS: 0.3 10*3/uL (ref 0.1–1.0)
MONOS PCT: 10 %
NEUTROS ABS: 2.1 10*3/uL (ref 1.7–7.7)
Neutrophils Relative %: 61 %

## 2017-01-01 LAB — ETHANOL

## 2017-01-01 LAB — APTT: APTT: 31 s (ref 24–36)

## 2017-01-01 MED ORDER — METOCLOPRAMIDE HCL 5 MG/ML IJ SOLN
10.0000 mg | Freq: Once | INTRAMUSCULAR | Status: AC
  Administered 2017-01-01: 10 mg via INTRAVENOUS
  Filled 2017-01-01: qty 2

## 2017-01-01 MED ORDER — INSULIN ASPART 100 UNIT/ML FLEXPEN: 10.0000 [IU] | PEN_INJECTOR | Freq: Three times a day (TID) | SUBCUTANEOUS | 3 refills | Status: DC

## 2017-01-01 MED ORDER — INSULIN ASPART 100 UNIT/ML ~~LOC~~ SOLN
10.0000 [IU] | Freq: Once | SUBCUTANEOUS | Status: AC
  Administered 2017-01-02: 10 [IU] via INTRAVENOUS
  Filled 2017-01-01: qty 1

## 2017-01-01 MED ORDER — FENTANYL CITRATE (PF) 100 MCG/2ML IJ SOLN
50.0000 ug | Freq: Once | INTRAMUSCULAR | Status: AC
  Administered 2017-01-01: 50 ug via INTRAVENOUS
  Filled 2017-01-01: qty 2

## 2017-01-01 MED ORDER — DIPHENHYDRAMINE HCL 50 MG/ML IJ SOLN
25.0000 mg | Freq: Once | INTRAMUSCULAR | Status: AC
  Administered 2017-01-01: 25 mg via INTRAVENOUS
  Filled 2017-01-01: qty 1

## 2017-01-01 MED ORDER — SODIUM CHLORIDE 0.9 % IV BOLUS (SEPSIS)
500.0000 mL | Freq: Once | INTRAVENOUS | Status: AC
  Administered 2017-01-01: 500 mL via INTRAVENOUS

## 2017-01-01 NOTE — ED Triage Notes (Signed)
Pt bib EMS from home d/t weakness.  Per EMS, pt's family initially reported stroke like sx, slurred speech, left sided facial droop.  Pt has a hx of stroke w/ known left sided deficits.  Pt's CBG in the field read high for EMS.  Pt c/o generalized HA since 1500.  Hx of migraines.

## 2017-01-01 NOTE — Telephone Encounter (Signed)
Phone call from the pharmacy.  Requesting clarification on Rx for Novolog Insulin that was sent in today.  She is requesting clarification on maximum dose per day.  Reported the sig reads:  "Novolog 10 units tid before meals; per sliding scale of an additional 2-10 units".  Stated the pt. is out of this Insulin, and due to insurance requirements, can't refill it until this is clarified.

## 2017-01-01 NOTE — Telephone Encounter (Signed)
Medication was not filled at last appointment- patient need refill

## 2017-01-01 NOTE — ED Provider Notes (Signed)
Emergency Department Provider Note   I have reviewed the triage vital signs and the nursing notes.   HISTORY  Chief Complaint Weakness   HPI [name] is a 61 y.o. female with PMH of CKD, CAD, DM, HTN, and prior CVA with left sided deficits presents to the emergency department for evaluation of generalized weakness with migraine type headache.  Symptoms began this morning.  Patient states that she had some difficulty walking and felt weak in her bilateral lower extremities.  She states that her daughter, who lives with her, thought that the left side of her face was drooping and that she was having some slurred speech.  Patient states that she did not appreciate this but has a known history of left-sided sites from her prior stroke.  Patient states that her blood sugars have also been running high today.  She has been compliant with her insulin.    Past Medical History:  Diagnosis Date  . Allergy   . Arthritis   . Chronic kidney disease   . Chronic pain   . Coronary artery disease   . Diabetes mellitus without complication ([place])   . Dyspnea   . GERD (gastroesophageal reflux disease)   . Heart murmur   . Hypertension   . Liver disease   . NASH (nonalcoholic steatohepatitis) 09/16/2016  . Obstructive sleep apnea   . Osteomyelitis ([place])   . Peripheral vascular disease ([place])   . Sleep apnea   . Stroke ([place])   . Ulcers of both great toes ([place]) 11/07/2016   right great toe, started as finger nail size and grew in 6 days     Patient Active Problem List   Diagnosis Date Noted  . Ulcer of toe of right foot, limited to breakdown of skin ([place]) 12/18/2016  . Non-pressure chronic ulcer of other part of right foot limited to breakdown of skin ([place]) 12/07/2016  . Diabetic foot ulcer ([place]) 12/04/2016  . Hypovolemia 12/04/2016  . Chronic pain syndrome 12/04/2016  . Diabetic peripheral neuropathy ([place]) 12/04/2016  . Hypotension   . Liver mass 09/20/2016  . Kidney disease,  chronic, stage III (GFR 30-59 ml/min) ([facility]) 12/17/2015  . Uncontrolled type 2 diabetes mellitus with diabetic polyneuropathy, with Lakeia Bradshaw-term current use of insulin ([place]) 07/18/2015  . Essential hypertension 06/06/2015  . Chronic back pain 07/27/2012  . OSA on CPAP 03/21/2012    Past Surgical History:  Procedure Laterality Date  . ABDOMINAL HYSTERECTOMY    . BACK SURGERY    . CESAREAN SECTION    . CHOLECYSTECTOMY    . HERNIA REPAIR    . JOINT REPLACEMENT    . KNEE SURGERY    . SPINE SURGERY    . TOE AMPUTATION    . TOE AMPUTATION Bilateral    3 on right foot 2 left foot   . TONSILLECTOMY    . TUBAL LIGATION      Current Outpatient Rx  . Order #: [id number] Class: Normal  . Order #: [id number] Class: Normal  . Order #: [id number] Class: Normal  . Order #: [id number] Class: Normal  . Order #: [id number] Class: Normal  . Order #: [id number] Class: Normal  . Order #: [id number] Class: Historical Med  . Order #: [id number] Class: Normal  . Order #: [id number] Class: Print  . Order #: [id number] Class: Print  . Order #: [id number] Class: Normal  . Order #: [id number] Class: Normal  . Order #: [id number] Class: Normal  . Order #: [id number] Class: Normal    Allergies Lasix [furosemide]; Norvasc [amlodipine besylate]; Prednisone; Vancomycin; Doxycycline;  Ibuprofen; Inderal [propranolol]; Lisinopril; Lovastatin; Norvasc [amlodipine besylate]; Nsaids; and Sulfa antibiotics  Family History  Problem Relation Age of Onset  . Heart disease Father   . Stroke Maternal Grandfather   . Heart disease Paternal Grandfather   . Cancer Mother        Thymoma, metastatic  . Stroke Father   . Multiple sclerosis Sister   . Epilepsy Sister     Social History Social History   Tobacco Use  . Smoking status: Never Smoker  . Smokeless tobacco: Never Used  Substance Use Topics  . Alcohol use: No  . Drug use: No    Review of Systems  Constitutional: No fever/chills Eyes: No visual changes. ENT: No sore  throat. Cardiovascular: Denies chest pain. Respiratory: Denies shortness of breath. Gastrointestinal: No abdominal pain.  No nausea, no vomiting.  No diarrhea.  No constipation. Genitourinary: Negative for dysuria. Musculoskeletal: Negative for back pain. Skin: Negative for rash. Neurological: Positive HA and left face droop with slurred speech (resolved).   10-point ROS otherwise negative.  ____________________________________________   PHYSICAL EXAM:  VITAL SIGNS: Vitals:   01/01/17 2245 01/01/17 2315  BP: 117/65 124/70  Pulse: 65 66  Resp: 16 15  SpO2: 95% 97%    Constitutional: Alert and oriented. Well appearing and in no acute distress. Eyes: Conjunctivae are normal. Head: Atraumatic. Nose: No congestion/rhinnorhea. Mouth/Throat: Mucous membranes are moist.  Oropharynx non-erythematous. Neck: No stridor.   Cardiovascular: Normal rate, regular rhythm. Good peripheral circulation. Grossly normal heart sounds.   Respiratory: Normal respiratory effort.  No retractions. Lungs CTAB. Gastrointestinal: Soft and nontender. No distention.  Musculoskeletal: No lower extremity tenderness nor edema. No gross deformities of extremities. Neurologic:  Normal speech and language. No face droop appreciated. Normal CN exam 2-12. 4+/5 LUE weakness and pronator drift (baseline). Normal LE weakness.  Skin:  Skin is warm, dry and intact. No rash noted.  ____________________________________________   LABS (all labs ordered are listed, but only abnormal results are displayed)  Labs Reviewed  CBC - Abnormal; Notable for the following components:      Result Value   WBC 3.4 (*)    Hemoglobin 11.8 (*)    HCT 35.8 (*)    Platelets 125 (*)    All other components within normal limits  COMPREHENSIVE METABOLIC PANEL - Abnormal; Notable for the following components:   Sodium 132 (*)    Potassium 3.4 (*)    Chloride 97 (*)    Glucose, Bld 480 (*)    Creatinine, Ser 1.59 (*)    Calcium 8.5  (*)    Total Protein 6.0 (*)    Albumin 2.9 (*)    GFR calc non Af Amer 34 (*)    GFR calc Af Amer 39 (*)    All other components within normal limits  URINALYSIS, ROUTINE W REFLEX MICROSCOPIC - Abnormal; Notable for the following components:   Color, Urine STRAW (*)    Glucose, UA >=500 (*)    Squamous Epithelial / LPF 0-5 (*)    All other components within normal limits  ETHANOL  PROTIME-INR  APTT  DIFFERENTIAL  RAPID URINE DRUG SCREEN, HOSP PERFORMED  I-STAT TROPONIN, ED  CBG MONITORING, ED   ____________________________________________  RADIOLOGY  Ct Head Wo Contrast  Result Date: 01/01/2017 CLINICAL DATA:  Frontal headache with dizziness EXAM: CT HEAD WITHOUT CONTRAST TECHNIQUE: Contiguous axial images were obtained from the base of the skull through the vertex without intravenous contrast. COMPARISON:  None. FINDINGS: Brain: No acute  territorial infarction, hemorrhage or intracranial mass is visualized. The ventricles are nonenlarged. Vascular: No hyperdense vessels.  Carotid artery calcification. Skull: Normal. Negative for fracture or focal lesion. Sinuses/Orbits: Mild mucosal thickening of the ethmoid sinuses. No acute orbital abnormality. Other: None IMPRESSION: No CT evidence for acute intracranial abnormality. Electronically Signed   By: Donavan Foil M.D.   On: 01/01/2017 22:03    ____________________________________________   PROCEDURES  Procedure(s) performed:   Procedures  None ____________________________________________   INITIAL IMPRESSION / ASSESSMENT AND PLAN / ED COURSE  Pertinent labs & imaging results that were available during my care of the patient were reviewed by me and considered in my medical decision making (see chart for details).  Patient presents to the emergency department for evaluation of generalized weakness with migraine type headache.  She has history of stroke with residual left-sided deficits which possibly worsen today after  daughter's observation.  Patient did not appreciate changes in strength, sensation, or speech.  Patient also hyperglycemic at home to the 500s.  Plan for labs, CT head, Reglan and Benadryl for migraine headache.  Patient has chronic kidney disease and is unable to take NSAIDs. Will give a small bolus of IVF.   Patient feeling some relief in HA. Spoke with Dr. Leonel Ramsay with Neurology regarding the case. Recommends MR brain to help evaluate further for CVA/TIA vs complex migraine. Patient's blood glucose is elevated. No DKA. Plan for IV insulin and reassess.   Care transferred to Dr. Leonides Schanz who will follow MRI and repeat CBG. Anticipate discharge if MRI negative and symptoms resolved.  ____________________________________________  FINAL CLINICAL IMPRESSION(S) / ED DIAGNOSES  Final diagnoses:  Generalized weakness  Hemiplegic migraine without status migrainosus, not intractable  Stroke-like symptoms     MEDICATIONS GIVEN DURING THIS VISIT:  Medications  insulin aspart (novoLOG) injection 10 Units (not administered)  sodium chloride 0.9 % bolus 500 mL (500 mLs Intravenous New Bag/Given 01/01/17 2329)  metoCLOPramide (REGLAN) injection 10 mg (10 mg Intravenous Given 01/01/17 2333)  diphenhydrAMINE (BENADRYL) injection 25 mg (25 mg Intravenous Given 01/01/17 2344)  fentaNYL (SUBLIMAZE) injection 50 mcg (50 mcg Intravenous Given 01/01/17 2344)    Note:  This document was prepared using Dragon voice recognition software and may include unintentional dictation errors.  Nanda Quinton, MD Emergency Medicine    Ashir Kunz, [facility], MD 01/02/17 [phone]

## 2017-01-02 DIAGNOSIS — G459 Transient cerebral ischemic attack, unspecified: Secondary | ICD-10-CM | POA: Diagnosis present

## 2017-01-02 DIAGNOSIS — R2990 Unspecified symptoms and signs involving the nervous system: Secondary | ICD-10-CM | POA: Diagnosis not present

## 2017-01-02 LAB — CBC
HCT: 39.1 % (ref 36.0–46.0)
Hemoglobin: 13.1 g/dL (ref 12.0–15.0)
MCH: 27.1 pg (ref 26.0–34.0)
MCHC: 33.5 g/dL (ref 30.0–36.0)
MCV: 81 fL (ref 78.0–100.0)
PLATELETS: 144 10*3/uL — AB (ref 150–400)
RBC: 4.83 MIL/uL (ref 3.87–5.11)
RDW: 13.2 % (ref 11.5–15.5)
WBC: 4.2 10*3/uL (ref 4.0–10.5)

## 2017-01-02 LAB — GLUCOSE, CAPILLARY
GLUCOSE-CAPILLARY: 173 mg/dL — AB (ref 65–99)
GLUCOSE-CAPILLARY: 219 mg/dL — AB (ref 65–99)
Glucose-Capillary: 304 mg/dL — ABNORMAL HIGH (ref 65–99)

## 2017-01-02 LAB — CBG MONITORING, ED
Glucose-Capillary: 318 mg/dL — ABNORMAL HIGH (ref 65–99)
Glucose-Capillary: 233 mg/dL — ABNORMAL HIGH (ref 65–99)

## 2017-01-02 MED ORDER — HEPARIN SODIUM (PORCINE) 5000 UNIT/ML IJ SOLN
5000.0000 [IU] | Freq: Three times a day (TID) | INTRAMUSCULAR | Status: DC
  Administered 2017-01-02 – 2017-01-04 (×6): 5000 [IU] via SUBCUTANEOUS
  Filled 2017-01-02 (×7): qty 1

## 2017-01-02 MED ORDER — TAPENTADOL HCL 50 MG PO TABS
50.0000 mg | ORAL_TABLET | Freq: Three times a day (TID) | ORAL | Status: DC
  Administered 2017-01-02 – 2017-01-04 (×5): 50 mg via ORAL
  Filled 2017-01-02 (×5): qty 1

## 2017-01-02 MED ORDER — PROCHLORPERAZINE EDISYLATE 5 MG/ML IJ SOLN
10.0000 mg | Freq: Once | INTRAMUSCULAR | Status: AC
  Administered 2017-01-02: 10 mg via INTRAVENOUS
  Filled 2017-01-02: qty 2

## 2017-01-02 MED ORDER — FENTANYL CITRATE (PF) 100 MCG/2ML IJ SOLN
50.0000 ug | Freq: Once | INTRAMUSCULAR | Status: AC
  Administered 2017-01-02: 50 ug via INTRAVENOUS
  Filled 2017-01-02: qty 2

## 2017-01-02 MED ORDER — TAPENTADOL HCL 50 MG PO TABS
50.0000 mg | ORAL_TABLET | Freq: Three times a day (TID) | ORAL | Status: DC
  Administered 2017-01-02: 50 mg via ORAL
  Filled 2017-01-02: qty 1

## 2017-01-02 MED ORDER — MAGNESIUM SULFATE 2 GM/50ML IV SOLN
2.0000 g | Freq: Once | INTRAVENOUS | Status: AC
Start: 2017-01-02 — End: 2017-01-02
  Administered 2017-01-02: 2 g via INTRAVENOUS
  Filled 2017-01-02: qty 50

## 2017-01-02 MED ORDER — BUSPIRONE HCL 10 MG PO TABS
15.0000 mg | ORAL_TABLET | Freq: Three times a day (TID) | ORAL | Status: DC
  Administered 2017-01-02 – 2017-01-04 (×7): 15 mg via ORAL
  Filled 2017-01-02 (×7): qty 2

## 2017-01-02 MED ORDER — STROKE: EARLY STAGES OF RECOVERY BOOK
Freq: Once | Status: AC
  Administered 2017-01-02: 12:00:00
  Filled 2017-01-02: qty 1

## 2017-01-02 MED ORDER — ENSURE ENLIVE PO LIQD
237.0000 mL | Freq: Two times a day (BID) | ORAL | Status: DC
  Administered 2017-01-02 – 2017-01-04 (×3): 237 mL via ORAL

## 2017-01-02 MED ORDER — INSULIN ASPART 100 UNIT/ML ~~LOC~~ SOLN
0.0000 [IU] | Freq: Every day | SUBCUTANEOUS | Status: DC
  Administered 2017-01-02 – 2017-01-03 (×2): 2 [IU] via SUBCUTANEOUS

## 2017-01-02 MED ORDER — ACETAMINOPHEN 650 MG RE SUPP: 650.0000 mg | RECTAL | Status: DC | PRN

## 2017-01-02 MED ORDER — CARVEDILOL 12.5 MG PO TABS
25.0000 mg | ORAL_TABLET | Freq: Two times a day (BID) | ORAL | Status: DC
  Administered 2017-01-02: 25 mg via ORAL
  Filled 2017-01-02: qty 2

## 2017-01-02 MED ORDER — ACETAMINOPHEN 160 MG/5ML PO SOLN: 650.0000 mg | ORAL | Status: DC | PRN

## 2017-01-02 MED ORDER — INSULIN GLARGINE 100 UNIT/ML ~~LOC~~ SOLN
60.0000 [IU] | Freq: Two times a day (BID) | SUBCUTANEOUS | Status: DC
  Administered 2017-01-02 – 2017-01-04 (×4): 60 [IU] via SUBCUTANEOUS
  Filled 2017-01-02 (×5): qty 0.6

## 2017-01-02 MED ORDER — SERTRALINE HCL 50 MG PO TABS
50.0000 mg | ORAL_TABLET | Freq: Every day | ORAL | Status: DC
  Administered 2017-01-02 – 2017-01-04 (×3): 50 mg via ORAL
  Filled 2017-01-02 (×4): qty 1

## 2017-01-02 MED ORDER — GABAPENTIN 300 MG PO CAPS
300.0000 mg | ORAL_CAPSULE | Freq: Three times a day (TID) | ORAL | Status: DC
  Administered 2017-01-02 – 2017-01-04 (×7): 300 mg via ORAL
  Filled 2017-01-02 (×7): qty 1

## 2017-01-02 MED ORDER — CLOPIDOGREL BISULFATE 75 MG PO TABS
75.0000 mg | ORAL_TABLET | Freq: Every day | ORAL | Status: DC
  Administered 2017-01-02 – 2017-01-04 (×3): 75 mg via ORAL
  Filled 2017-01-02 (×3): qty 1

## 2017-01-02 MED ORDER — INSULIN ASPART 100 UNIT/ML ~~LOC~~ SOLN
6.0000 [IU] | Freq: Three times a day (TID) | SUBCUTANEOUS | Status: DC
  Administered 2017-01-02 – 2017-01-04 (×6): 6 [IU] via SUBCUTANEOUS

## 2017-01-02 MED ORDER — SODIUM CHLORIDE 0.9 % IV SOLN
INTRAVENOUS | Status: DC
  Administered 2017-01-02 – 2017-01-04 (×4): via INTRAVENOUS

## 2017-01-02 MED ORDER — INSULIN ASPART 100 UNIT/ML ~~LOC~~ SOLN
10.0000 [IU] | Freq: Three times a day (TID) | SUBCUTANEOUS | Status: DC
  Administered 2017-01-02: 10 [IU] via SUBCUTANEOUS
  Filled 2017-01-02: qty 1

## 2017-01-02 MED ORDER — HYDROCHLOROTHIAZIDE 25 MG PO TABS
25.0000 mg | ORAL_TABLET | Freq: Every day | ORAL | Status: DC
  Administered 2017-01-02: 25 mg via ORAL
  Filled 2017-01-02: qty 1

## 2017-01-02 MED ORDER — INSULIN ASPART 100 UNIT/ML ~~LOC~~ SOLN
5.0000 [IU] | Freq: Once | SUBCUTANEOUS | Status: AC
  Administered 2017-01-02: 5 [IU] via SUBCUTANEOUS
  Filled 2017-01-02: qty 1

## 2017-01-02 MED ORDER — VALPROATE SODIUM 500 MG/5ML IV SOLN
1000.0000 mg | Freq: Once | INTRAVENOUS | Status: AC
  Administered 2017-01-02: 1000 mg via INTRAVENOUS
  Filled 2017-01-02: qty 10

## 2017-01-02 MED ORDER — EXENATIDE 5 MCG/0.02ML ~~LOC~~ SOPN: 5.0000 ug | PEN_INJECTOR | Freq: Two times a day (BID) | SUBCUTANEOUS | Status: DC

## 2017-01-02 MED ORDER — SENNOSIDES-DOCUSATE SODIUM 8.6-50 MG PO TABS: 1.0000 | ORAL_TABLET | Freq: Every evening | ORAL | Status: DC | PRN

## 2017-01-02 MED ORDER — INSULIN GLARGINE 100 UNIT/ML ~~LOC~~ SOLN
60.0000 [IU] | Freq: Two times a day (BID) | SUBCUTANEOUS | Status: DC
  Administered 2017-01-02: 60 [IU] via SUBCUTANEOUS
  Filled 2017-01-02: qty 0.6

## 2017-01-02 MED ORDER — INSULIN ASPART 100 UNIT/ML ~~LOC~~ SOLN
0.0000 [IU] | Freq: Three times a day (TID) | SUBCUTANEOUS | Status: DC
  Administered 2017-01-02: 15 [IU] via SUBCUTANEOUS
  Administered 2017-01-02: 4 [IU] via SUBCUTANEOUS
  Administered 2017-01-03 (×3): 7 [IU] via SUBCUTANEOUS
  Administered 2017-01-04: 11 [IU] via SUBCUTANEOUS
  Administered 2017-01-04: 3 [IU] via SUBCUTANEOUS

## 2017-01-02 MED ORDER — ACETAMINOPHEN 325 MG PO TABS
650.0000 mg | ORAL_TABLET | ORAL | Status: DC | PRN
  Administered 2017-01-03 – 2017-01-04 (×2): 650 mg via ORAL
  Filled 2017-01-02 (×2): qty 2

## 2017-01-02 NOTE — H&P (Signed)
Date: 01/02/2017               Patient Name:  [name] MRN: [id number]  DOB: [birth year] Age / Sex: 61 y.o., female   PCP: Forrest Moron, MD         Medical Service: Internal Medicine Teaching Service         Attending Physician: Dr. Sid Falcon, MD    First Contact: Dr. Berline Lopes Pager: [phone]  Second Contact: Dr. Heber Van Dyne Pager: [phone]       After Hours (After 5p/  First Contact Pager: [phone]  weekends / holidays): Second Contact Pager: [phone]   Chief Complaint: facial droop and headache  History of Present Illness: [name] is a 61 year old female who presents with acute onset left sided weakness, headache and facial droop as of 1500 hours the prior day. [name] has a PMHx significant for previous CVA w/ residual weakness(as per patient), HTN, liver mass, OSA, DMII poorly controlled, and CKD III. [name] stated that while at a local department store [name] began to feel weak and desired to return home. Upon attempting to climb the stairs into her home [name] tripped and stumbled being unable to lift her left leg. It was at this point that her daughter, with whom [name] lives, noted what [name] believed to be as left sided facial droop and slurred speech. EMS was called and [name] was brought to the hospital. They denied LOC, loss of bowel or bladder control.  Patient denied visual changes, chest pain, palpitations, dyspnea, fever, chills, sick contacts, vomiting, abdominal pain. Attested to Nausea, headache, photophobia, and chronic muscle aches.   In the ED, CT-Head was unremarkable for acute intracranial process, EtOH negative, INR WNL's as was APTT, CBC demonstrated mild leukopenia, anemia 11.8 and plt's 125. Troponin, UA and UDS unremarkable. Initial CMP was demonstrated Na 132, K+ 3.4, Glucose 480, Cr 1.59 and a GFR of 34. Repeat CBC demonstrated resolution of the leukopenia to 4.2, plt's 144 and Hgb 13.1. Serum glucose was brought down with insulin aspart and glargine to 173  at this time. Neurology and stroke team consulted. Unassigned admission was placed and the IMTS was called for admission.   Meds:  Current Meds  Medication Sig  . busPIRone (BUSPAR) 15 MG tablet Take 1 tablet (15 mg total) by mouth 3 (three) times daily.  . carvedilol (COREG) 25 MG tablet Take 1 tablet (25 mg total) by mouth 2 (two) times daily.  Marland Kitchen exenatide (BYETTA 5 MCG PEN) 5 MCG/0.02ML SOPN injection Inject 0.02 mLs (5 mcg total) into the skin 2 (two) times daily with a meal.  . gabapentin (NEURONTIN) 300 MG capsule Take 1 capsule (300 mg total) by mouth 3 (three) times daily.  . hydrochlorothiazide (HYDRODIURIL) 25 MG tablet Take 1 tablet (25 mg total) by mouth daily.  . insulin aspart (NOVOLOG) 100 UNIT/ML FlexPen Inject 10 Units into the skin 3 (three) times daily before meals. "PER A SLIDING SCALE OF AN ADDITIONAL 2-10 UNITS"  . LANTUS SOLOSTAR 100 UNIT/ML Solostar Pen Inject 60 Units into the skin 2 (two) times daily. MORNING and BEDTIME  . sertraline (ZOLOFT) 50 MG tablet Take 1 tablet (50 mg total) by mouth daily.  . tapentadol (NUCYNTA) 50 MG tablet Take 1 tablet (50 mg total) by mouth 3 (three) times daily.   Allergies: Allergies as of 01/01/2017 - Review Complete 01/01/2017  Allergen Reaction Noted  . Lasix [furosemide]  11/23/2016  . Norvasc [amlodipine besylate]  11/23/2016  .  Prednisone Other (See Comments) 11/23/2016  . Vancomycin Other (See Comments) 09/19/2016  . Doxycycline Hives 11/23/2016  . Ibuprofen Other (See Comments) 12/03/2016  . Inderal [propranolol] Cough 09/19/2016  . Lisinopril Other (See Comments) 12/03/2016  . Lovastatin Other (See Comments) 12/03/2016  . Norvasc [amlodipine besylate] Other (See Comments) 09/19/2016  . Nsaids Other (See Comments) 12/03/2016  . Sulfa antibiotics Nausea Only 09/19/2016   Past Medical History:  Diagnosis Date  . Allergy   . Arthritis   . Chronic kidney disease   . Chronic pain   . Coronary artery disease   .  Diabetes mellitus without complication ([place])   . Dyspnea   . GERD (gastroesophageal reflux disease)   . Heart murmur   . Hypertension   . Liver disease   . NASH (nonalcoholic steatohepatitis) 09/16/2016  . Obstructive sleep apnea   . Osteomyelitis ([place])   . Peripheral vascular disease ([place])   . Sleep apnea   . Stroke ([place])   . Ulcers of both great toes ([place]) 11/07/2016   right great toe, started as finger nail size and grew in 6 days     Family History:  Family History  Problem Relation Age of Onset  . Heart disease Father   . Stroke Maternal Grandfather   . Heart disease Paternal Grandfather   . Cancer Mother        Thymoma, metastatic  . Stroke Father   . Multiple sclerosis Sister   . Epilepsy Sister     Social History:  Social History   Tobacco Use  . Smoking status: Never Smoker  . Smokeless tobacco: Never Used  Substance Use Topics  . Alcohol use: No  . Drug use: No    Review of Systems: A complete ROS was negative except as per HPI. ROS negative except as per HPI.  Physical Exam: Blood pressure 126/79, pulse 61, temperature 97.9 F (36.6 C), temperature source Oral, resp. rate 19, SpO2 100 %. Physical Exam  Constitutional: [name] is oriented to person, place, and time. [name] appears well-developed and well-nourished. No distress.  HENT:  Head: Normocephalic and atraumatic.  Eyes: Conjunctivae and EOM are normal.  Neck: Normal range of motion. Neck supple.  Cardiovascular: Normal rate and regular rhythm.  Murmur heard. Pulmonary/Chest: Effort normal and breath sounds normal. No stridor. No respiratory distress.  Abdominal: Soft. Bowel sounds are normal. [name] exhibits no distension. There is no tenderness.  Musculoskeletal: [name] exhibits no edema or tenderness.  Neurological: [name] is alert and oriented to person, place, and time. [name] displays normal reflexes. A sensory deficit (Patient concerned with mild numbness of the left face.) is present. No cranial nerve  deficit (Grossly intact 2-12).  Difficult to assess. Patients strength is 5/5 right, 4/5 left upper and lower. Decreased sensation on the left and right is most likely secondary to diabetic neuropathy.   Skin: Skin is warm. [name] is not diaphoretic.   EKG: personally reviewed my interpretation is most recent not in our system as viewable. Will reorder.   CXR: personally reviewed my interpretation is not performed given presentation. CT-head unremarkable for acute bleed.   Assessment & Plan by Problem: Active Problems:   TIA (transient ischemic attack)  [name] is a 61 year old female who presents to the ED for transient left sided facial droop, left sided weakness, and slurred speech. [name] has a VNS which may not be compatible with MRI thereby effectively limiting treatment to a stroke evaluation as a stroke can not be entirely excluded. The  differential includes ITA, stroke, migraine induced stroke recrudescence(neurology), migraine, migraine with aura.   TIA: At this time it is difficult to assess if the left sided weakness and numbness is residual from the patients previous stroke or new symptoms of a TIA/stroke. Given that it appears as if these symptoms closely mimicked her previous symptoms stroke recrudescence was considered by the initial neurologist given the resolution of the symptoms on exam. This seems highly possible but only likely confirmed with MRI failing to demonstrate an acute process.  -Repeat CR head in 24 hours -carotid dopplers -TTE HgbA1c, lipid panel, BMP and CBC in am for risk factor modification and stratification -Continue atorvastatin 68m QHS -ASS 3267mPO, clopidogrel 7572maily -PO/OT and speech consulted -Continue tele, neuro checks q2 for 12 hours and then q4 as per neuro -Will determine MRI compatibility of the VNS   DMII: Poorly controlled type 2 diabetic with peripheral neuropathy since 1997 -SSI resistant scale ordered -Basal with Lantus 60 units  QHS -HgbA1c pending -Gabapentin 300m48mD for peripheral neuropathy   HTN: Moderately well controlled hypertension. Currently WNL's -Will permit hypertension if it occurs for 24-48 hours treat if greater than SBP 220.  -Restart home meds at that time to titrate BP down if elevated to baseline prior to discharge  Migraine: Patient with photophobia, headache, nausea, better with rest in a dark room, partially relieved with compazine, valproate, magnesium -Acetaminophen 650mg68m for headache   Depression and anxiety: Continue sertraline 50mg 77my  Diet: carb modified Fluids: none Code: full DVT PPX: heparin 5K units Dispo: Admit patient to Observation with expected length of stay less than 2 midnights.  Signed: HarbreKathi Ludwig2/02/2016, 1:45 PM  Pager: Pager# [phone]

## 2017-01-02 NOTE — Progress Notes (Signed)
STROKE TEAM PROGRESS NOTE   HISTORY OF PRESENT ILLNESS (per record) [name] is a 61 y.o. female with a history of stroke with left-sided weakness with some residual deficits who presents with worsening of her deficits as well as migraine headache.  She states that she used to get migraines fairly frequently, but does not have them very often anymore.  She has had maybe one since her last stroke and did not have worsening of her deficits with it.  It was not as bad as her current one.  Currently she has right-sided headache with photophobia it is throbbing in character. She states it is typical for her severe headaches.  Of note, she has a daughter who has a history of complicated migraine.  LKW: 3 PM tpa given?: no, outside of window     SUBJECTIVE (INTERVAL HISTORY) The patient's daughter was at the bedside. The patient feels she is improving. An MRI was planned; however, the patient reports that she has a spine stimulator implanted which may not allow for the MRI. A CT angiogram of the head is being considered if the patient's renal status can be improved with IV fluids. The patient also reports that she has had elevated liver function tests in the past with statin medications.   OBJECTIVE Temp:  [97.9 F (36.6 C)-98.9 F (37.2 C)] 98.9 F (37.2 C) (12/01 1504) Pulse Rate:  [61-71] 70 (12/01 1504) Cardiac Rhythm: Heart block (12/01 1340) Resp:  [9-24] 18 (12/01 1504) BP: (117-153)/(62-101) 124/62 (12/01 1504) SpO2:  [87 %-100 %] 98 % (12/01 1504) Weight:  [228 lb (103.4 kg)] 228 lb (103.4 kg) (12/01 1422)  CBC:  Recent Labs  Lab 01/01/17 2205 01/02/17 1204  WBC 3.4* 4.2  NEUTROABS 2.1  --   HGB 11.8* 13.1  HCT 35.8* 39.1  MCV 81.9 81.0  PLT 125* 144*    Basic Metabolic Panel:  Recent Labs  Lab 01/01/17 2205  NA 132*  K 3.4*  CL 97*  CO2 26  GLUCOSE 480*  BUN 17  CREATININE 1.59*  CALCIUM 8.5*    Lipid Panel: No results found for: CHOL, TRIG, HDL,  CHOLHDL, VLDL, LDLCALC HgbA1c:  Lab Results  Component Value Date   HGBA1C 12.7 11/23/2016   Urine Drug Screen:     Component Value Date/Time   LABOPIA NONE DETECTED 01/01/2017 2222   COCAINSCRNUR NONE DETECTED 01/01/2017 2222   LABBENZ NONE DETECTED 01/01/2017 2222   AMPHETMU NONE DETECTED 01/01/2017 2222   THCU NONE DETECTED 01/01/2017 2222   LABBARB NONE DETECTED 01/01/2017 2222    Alcohol Level     Component Value Date/Time   ETH <10 01/01/2017 2205    IMAGING   Ct Head Wo Contrast 01/01/2017 IMPRESSION:  No CT evidence for acute intracranial abnormality.     MRI Brain WO Contrast - pending 01/02/2017   Transthoracic Echocardiogram 09/21/2016 Study Conclusions  - Procedure narrative: Transthoracic echocardiography. Image   quality was poor. The study was technically difficult, as a   result of poor sound wave transmission and body habitus. - Left ventricle: The cavity size was normal. There was mild focal   basal hypertrophy of the septum. Systolic function was normal.   The estimated ejection fraction was in the range of 60% to 65%.   Wall motion was normal; there were no regional wall motion   abnormalities. Doppler parameters are consistent with abnormal   left ventricular relaxation (grade 1 diastolic dysfunction). - Ascending aorta: The ascending aorta was mildly dilated.  Impressions:  - Normal LV systolic function; mild diastolic dysfunction;   sclerotic aortic valve; mildly dilated ascending aorta.    Bilateral Carotid Dopplers - pending     PHYSICAL EXAM Vitals:   01/02/17 1105 01/02/17 1207 01/02/17 1422 01/02/17 1504  BP: 133/84 126/79 133/65 124/62  Pulse: 68 61 68 70  Resp: (!) 24 19 18 18   Temp:  97.9 F (36.6 C)  98.9 F (37.2 C)  TempSrc:  Oral  Oral  SpO2: 99% 100% 97% 98%  Weight:   228 lb (103.4 kg)   Height:   5' 6"  (1.676 m)   Pleasant middle-age Caucasian lady currently not in distress. . Afebrile. Head is  nontraumatic. Neck is supple without bruit.    Cardiac exam no murmur or gallop. Lungs are clear to auscultation. Distal pulses are well felt. Neurological Exam ;  Awake  Alert oriented x 3. Normal speech and language.eye movements full without nystagmus.fundi were not visualized. Vision acuity and fields appear normal. Hearing is normal. Palatal movements are normal. Face symmetric. Tongue midline. Normal strength, tone, reflexes and coordination except for mild diminished fine finger movements on the left and mild left grip and hip is a weakness which are old as per the patient from previous stroke. Normal sensation. Gait deferred.     ASSESSMENT/PLAN Ms. [name] is a 61 y.o. female with history of a previous stroke, obstructive sleep apnea, liver disease, hypertension, history of migraine headaches, diabetes mellitus, coronary artery disease, and chronic kidney disease presenting with left-sided weakness and headache with photophobia. She did not receive IV t-PA due to late presentation.  Stroke vs TIA:  Await further imaging  Resultant  No new deficits. Mild left sided weakness from prior old stroke  CT head - No CT evidence for acute intracranial abnormality.   MRI head - spinal stimulator  MRA head - spinal stimulator  Carotid Doppler - pending  2D Echo - EF 60-65%. No cardiac source of emboli identified.  LDL - pending  HgbA1c - pending  VTE prophylaxis - subcutaneous heparin Diet Carb Modified Fluid consistency: Thin; Room service appropriate? Yes  No antithrombotic prior to admission, now on clopidogrel 75 mg daily  Patient counseled to be compliant with her antithrombotic medications  Ongoing aggressive stroke risk factor management  Therapy recommendations:  pending  Disposition:  Pending  Hypertension  Stable  Permissive hypertension (OK if < 220/120) but gradually normalize in 5-7 days  Long-term BP goal normotensive  Hyperlipidemia  Home meds:  No lipid lowering medications prior to admission  LDL pending, goal < 70 (history of liver disease)   Diabetes  HgbA1c pending, goal < 7.0  Unc / Controlled  Other Stroke Risk Factors  Advanced age  Obesity, Body mass index is 36.8 kg/m., recommend weight loss, diet and exercise as appropriate   Hx stroke/TIA  Family hx stroke (father and grandfather)  Coronary artery disease  Migraines  Obstructive sleep apnea,  Other Active Problems  Mild thrombocytopenia  Mild hypokalemia  Chronic kidney disease  Spinal stimulator for history of low back pain (currently turned off)   Plan / Recommendations  Consider CTA of head and neck if renal function improves with overnight hydration.  Reported NSAID allergy -> Plavix (instead of ASA) (was on Plavix in past)  Hospital day # 0  I have personally examined this patient, reviewed notes, independently viewed imaging studies, participated in medical decision making and plan of care.ROS completed by me personally and pertinent positives fully documented  I have made any additions or clarifications directly to the above note. Agree with note above.  She presented with worsening of her whole stroke deficits in setting of severe headache. Initial CT scan is negative and it is unclear as to whether patient can get an MRI are not due to her spinal cord stimulator. Recommend Plavix for stroke prevention and check   CT angiogram of the brain and neck if MRI cannot be done. Continue ongoing stroke workup. On discussion with patient and daughter the bedside and answered questions. Greater than 50% time during this 35 minute visit was spent on counseling and coordination of care about her stroke and discussion about risk factor modification and answering questions.  Antony Contras, MD Medical Director [facility] Pager: [phone] 01/02/2017 3:33 PM   To contact Stroke Continuity provider, please refer to [email or web address] After  hours, contact General Neurology

## 2017-01-02 NOTE — ED Provider Notes (Signed)
12:00 AM  Assumed care from Dr. Laverta Baltimore.  Patient is a 61 y.o. female with history of migraine headaches, previous CVA with left upper extremity weakness who presents to the emergency department with a headache that she described as her typical migraine headache today, bilateral leg weakness, left-sided facial droop and slurred speech.  She is currently at her neurologic baseline with some left arm weakness.  She does have a history of insulin-dependent diabetes and reports compliance but is often hyperglycemic.  Not in DKA.  Has been given a migraine cocktail and headache is better.  Getting IV insulin and we will recheck her blood sugar.  Plan is to obtain MRI of her brain per neurology recommendations.  Likely complex migraine.   3:30 AM  Pt cannot have an MRI at this time as she has a spinal stimulator and will need the [employer] technician to come in the morning to see if she is eligible for MRI.  She is still having some slurred speech on examination and left facial droop.  Moving all 4 extremities.  Has chronic left arm weakness.  Still having mild headache but reports migraine cocktail has improved the headache.  Will discuss with neurology given she is having these new focal neurologic deficits and has had a history of stroke that she may need admission.  3:35 AM  D/w Dr. Leonel Ramsay with neurology who will see patient in consult.  4:00 AM  Neuro recommends that the patient stay in the emergency department to receive an MRI of her brain.  [employer] technician will be a here in the morning per nurse.  Currently her we are holding all beds for telemetry, stepdown and ICU.  He thinks this is either a new stroke or complicated migraine.  She has a family history of complex migraines.  Will give another dose of fentanyl for her headache.  I have ordered her home medications as well.  Her blood sugar is improving.  It is now 318.  7:00 AM  Signed out to Dr. Rex Kras to follow up on patient's MRI.  If she is  unable to have an MRI in the ED, neuro will need to be contacted and patient will likely be admitted.  I reviewed all nursing notes, vitals, pertinent previous records, EKGs, lab and urine results, imaging (as available).    Chanee Henrickson, Delice Bison, DO 01/02/17 [phone]

## 2017-01-02 NOTE — Progress Notes (Signed)
SAME DAY PROGRESS NOTE  Seen and examined.  Continues to have headache and LSW. On exam left hemiparesis, left facial droop. No CN findings.  A&P Likely stroke or TIA Can not get MRI due to nerve stimulator not compatible with MRI.  Recs: Admit for stroke w/u as below: -Admit to hospitalist or observation -Telemetry monitoring -Allow for permissive hypertension for the first 24-48h - only treat PRN if SBP >220 mmHg. Blood pressures can be gradually normalized to SBP<140 upon discharge. -Repeat CTH in 24h -Carotid dopplers -CT Angiogram of Head and neck - not feasible due to renal function abnormalities -Echocardiogram -HgbA1c, fasting lipid panel -Frequent neuro checks -Prophylactic therapy-Antiplatelet med: Aspirin - dose 363m PO or 3014mPR -Atorvastatin 80 mg PO daily -Risk factor modification -PT consult, OT consult, Speech consult  Stroke team to follow.  AsAmie PortlandMD [facility] [phone] 7pm to 7am, please call on call as listed on AMION.  No charge note

## 2017-01-02 NOTE — Progress Notes (Signed)
Wound care completed  Wet to dry dressing per pt request  Pt states she completes daily  Will inform night shift RN as well

## 2017-01-02 NOTE — ED Provider Notes (Signed)
I received pt in signout from Dr. Leonides Schanz. Discussed with neurology, Dr. Rory Percy. Given she likely cannot have MRI brain due to implanted device, he has recommended admission for stroke work up and has made a note to list recommendations for w/u. Discussed admission w/ internal medicine teaching service.   Little, Wenda Overland, MD 01/02/17 1026

## 2017-01-02 NOTE — ED Notes (Signed)
sleeing when I came in   She reports that her head is still hurting  But she went right back to sleep before I gave her any medicine

## 2017-01-02 NOTE — ED Notes (Signed)
CBG 233; RN notified

## 2017-01-02 NOTE — ED Notes (Signed)
The pt reports she fells better her headache is almost gone

## 2017-01-02 NOTE — H&P (Signed)
Date: 01/02/2017               Patient Name:  [name] MRN: [id number]  DOB: 07/28/1955 Age / Sex: 61 y.o., female   PCP: Forrest Moron, MD              Medical Service: Internal Medicine Teaching Service              Attending Physician: Dr. Rex Kras, Wenda Overland, MD    First Contact: Louann Liv, MS3 Pager: [phone]  Second Contact: Dr. Berline Lopes Pager: [phone]  Third Contact Dr. Heber Hamler Pager: [phone]       After Hours (After 5p/  First Contact Pager: [phone]  weekends / holidays): Second Contact Pager: [phone]   Chief Complaint: Left-sided weakness  History of Present Illness: Patient first noticed weakness and difficulty lifting her left leg yesterday morning. Around 3:00pm yesterday, patient and patient's daughter noticed the left side of her face drooping and slurred speech. She was alert and responsive throughout the day. Prior to this, the patient said she fell from a standing position due to loss of balance, but caught herself and did not hit her head or lose consciousness. She also endorses a headache that began around the same time. She has a history of migraines and this felt similar and was accompanied by nausea and photosensitivity. She had a previous stroke in January 2017 that left her with some residual left-sided weakness, primarily in her LLE. This felt similar to her previous stroke. At baseline, she is independently mobile and will use a walker or cane intermittently for balance. This morning she reports improvement in her slurred speech and headache, but still endorses weakness on her left side. She said she had some difficulty swallowing yesterday that seems resolved today. She denies fever, chills, vomiting, recent sickness or sick contacts.   Meds: Current Facility-Administered Medications  Medication Dose Route Frequency Provider Last Rate Last Dose  . busPIRone (BUSPAR) tablet 15 mg  15 mg Oral TID Ward, Kristen N, DO      . carvedilol (COREG)  tablet 25 mg  25 mg Oral BID Ward, Kristen N, DO      . exenatide (BYETTA) immediate release injection  5 mcg Subcutaneous BID WC Ward, Kristen N, DO      . gabapentin (NEURONTIN) capsule 300 mg  300 mg Oral TID Ward, Kristen N, DO      . hydrochlorothiazide (HYDRODIURIL) tablet 25 mg  25 mg Oral Daily Ward, Kristen N, DO      . insulin aspart (novoLOG) injection 10 Units  10 Units Subcutaneous TID AC Ward, Kristen N, DO   10 Units at 01/02/17 0829  . insulin glargine (LANTUS) injection 60 Units  60 Units Subcutaneous BID Ward, Kristen N, DO      . sertraline (ZOLOFT) tablet 50 mg  50 mg Oral Daily Ward, Kristen N, DO      . tapentadol (NUCYNTA) tablet 50 mg  50 mg Oral TID Ward, Delice Bison, DO       Current Outpatient Medications  Medication Sig Dispense Refill  . busPIRone (BUSPAR) 15 MG tablet Take 1 tablet (15 mg total) by mouth 3 (three) times daily. 90 tablet 1  . carvedilol (COREG) 25 MG tablet Take 1 tablet (25 mg total) by mouth 2 (two) times daily. 180 tablet 1  . exenatide (BYETTA 5 MCG PEN) 5 MCG/0.02ML SOPN injection Inject 0.02 mLs (5 mcg total) into the skin 2 (two) times daily with a meal.  1.2 mL 3  . gabapentin (NEURONTIN) 300 MG capsule Take 1 capsule (300 mg total) by mouth 3 (three) times daily. 90 capsule 3  . hydrochlorothiazide (HYDRODIURIL) 25 MG tablet Take 1 tablet (25 mg total) by mouth daily. 90 tablet 1  . insulin aspart (NOVOLOG) 100 UNIT/ML FlexPen Inject 10 Units into the skin 3 (three) times daily before meals. "PER A SLIDING SCALE OF AN ADDITIONAL 2-10 UNITS" 15 mL 3  . LANTUS SOLOSTAR 100 UNIT/ML Solostar Pen Inject 60 Units into the skin 2 (two) times daily. MORNING and BEDTIME  1  . sertraline (ZOLOFT) 50 MG tablet Take 1 tablet (50 mg total) by mouth daily. 90 tablet 1  . tapentadol (NUCYNTA) 50 MG tablet Take 1 tablet (50 mg total) by mouth 3 (three) times daily. 90 tablet 0   Allergies: Allergies as of 01/01/2017 - Review Complete 01/01/2017  Allergen  Reaction Noted  . Lasix [furosemide]  11/23/2016  . Norvasc [amlodipine besylate]  11/23/2016  . Prednisone Other (See Comments) 11/23/2016  . Vancomycin Other (See Comments) 09/19/2016  . Doxycycline Hives 11/23/2016  . Ibuprofen Other (See Comments) 12/03/2016  . Inderal [propranolol] Cough 09/19/2016  . Lisinopril Other (See Comments) 12/03/2016  . Lovastatin Other (See Comments) 12/03/2016  . Norvasc [amlodipine besylate] Other (See Comments) 09/19/2016  . Nsaids Other (See Comments) 12/03/2016  . Sulfa antibiotics Nausea Only 09/19/2016   Past Medical History:  Diagnosis Date  . Allergy   . Arthritis   . Chronic kidney disease   . Chronic pain   . Coronary artery disease   . Diabetes mellitus without complication ([place])   . Dyspnea   . GERD (gastroesophageal reflux disease)   . Heart murmur   . Hypertension   . Liver disease   . NASH (nonalcoholic steatohepatitis) 09/16/2016  . Obstructive sleep apnea   . Osteomyelitis ([place])   . Peripheral vascular disease ([place])   . Sleep apnea   . Stroke ([place])   . Ulcers of both great toes ([place]) 11/07/2016   right great toe, started as finger nail size and grew in 6 days    Past Surgical History:  Procedure Laterality Date  . ABDOMINAL HYSTERECTOMY    . BACK SURGERY    . CESAREAN SECTION    . CHOLECYSTECTOMY    . HERNIA REPAIR    . JOINT REPLACEMENT    . KNEE SURGERY    . SPINE SURGERY    . TOE AMPUTATION    . TOE AMPUTATION Bilateral    3 on right foot 2 left foot   . TONSILLECTOMY    . TUBAL LIGATION     Family History  Problem Relation Age of Onset  . Heart disease Father   . Stroke Maternal Grandfather   . Heart disease Paternal Grandfather   . Cancer Mother        Thymoma, metastatic  . Stroke Father   . Multiple sclerosis Sister   . Epilepsy Sister    Social history Patient recently moved from [place] to live with her daughter. She has never smoked and she does not drink alcohol. She is able to afford her  medications and she reports adherence to her prescription regimen. Daughter says she may not always get her medications on time.    Review of Systems: Review of Systems  Constitutional: Negative for chills and fever.  HENT: Negative for hearing loss.   Eyes: Positive for photophobia. Negative for blurred vision and double vision.  Respiratory: Negative for  shortness of breath.   Cardiovascular: Negative for chest pain.  Gastrointestinal: Positive for nausea. Negative for abdominal pain and vomiting.  Genitourinary:       Not assessed  Musculoskeletal: Positive for back pain and falls.  Skin: Negative.   Neurological: Positive for sensory change, focal weakness, weakness and headaches. Negative for loss of consciousness.   Physical Exam: Blood pressure 133/80, pulse 70, resp. rate 13, SpO2 97 %. Physical Exam  Constitutional: She is oriented to person, place, and time.  Well-developed, reclined on stretcher, appears comfortable  HENT:  Neck with normal range of motion  Eyes: Pupils are equal, round, and reactive to light.  Cardiovascular: Normal rate and regular rhythm. Exam reveals no gallop and no friction rub.  No murmur heard. Pulmonary/Chest: Effort normal and breath sounds normal.  Abdominal: Soft. Bowel sounds are normal. There is no tenderness.  Musculoskeletal:  5/5 strength in right upper and lower extremity. 4/5 strength LUE and LLE. No pitting edema in BLE.  Neurological: She is alert and oriented to person, place, and time.  2+ brachioradialis reflex. Normal muscle tone. CN II-IV, VI-XII grossly intact. Diminished sensation on left in V1, V2, V3.   Skin: Skin is warm and dry.  Psychiatric: She has a normal mood and affect. Her behavior is normal.   Assessment & Plan by Problem: Ms. Zou is a 61 year old female with PMH significant for CVA (January 2017) with residual left-sided weakness, CAD, T2DM, HTN, and CKD who presents for left-sided weakness and migraine  headache, now resolving.   Transient ischemic attack vs stroke CT head with no evidence of acute intracranial abnormality. Cannot get MRI at present as she has a spinal stimulator in place; however, a [employer] technician will evaluate and make recommendations. Will plan to repeat CT head in 24 hours. Neurology is following and we will admit for stroke workup. Differential includes new stroke, recrudescence at site of previous stroke, and TIA.   - Repeat CT head in 24 hours + carotid dopplers - Telemetry + neuro checks q2h x 12 hours then q4h - TTE, HbA1c, lipid panel, BMP and CBC tomorrow - Atorvastatin 3m qd - ASA 3264mPO - PT/OT and speech consults  Migraine, resolving Patient has experienced migraines in the past, although less so recently.  - Acetaminophen PRN for pain control  HTN Hypertension is reportedly well-controlled at baseline. Will hold home medications for first 24-48 hours or until after cleared by MRI. - Treat PRN if SBP >220. Normalize to SBP<140 on discharge.  Uncontrolled T2DM and CKD Glucose 480 on admission, now 233. Per chart review, seems chronically uncontrolled. UA glucose >500, negative for ketones. Cannot CT angiogram head and neck due to elevated creatinine (1.59).  - Insulin glargine 60 units subcutaneous BID + insulin aspart 6 units TID with meals + SSI - CBG monitoring qod  This is a [employer] information officerote.  The care of the patient was discussed with Dr. HaBerline Lopesnd the assessment and plan was formulated with their assistance.  Please see their note for official documentation of the patient encounter.   Signed: McLouann LivMedical Student 01/02/2017, 10:51 AM

## 2017-01-02 NOTE — Consult Note (Signed)
Neurology Consultation Reason for Consult: Left-sided weakness Referring Physician: Ward, K  CC: Left-sided weakness  History is obtained from: Patient  HPI: [name] is a 61 y.o. female with a history of stroke with left-sided weakness with some residual deficits who presents with worsening of her deficits as well as migraine headache.  She states that she used to get migraines fairly frequently, but does not have them very often anymore.  She has had maybe one since her last stroke and did not have worsening of her deficits with it.  It was not as bad as her current one.  Currently she has right-sided headache with photophobia it is throbbing in character.  She states it is typical for her severe headaches.  Of note, she has a daughter who has a history of complicated migraine.  LKW: 3 PM tpa given?: no, outside of window    ROS: A 14 point ROS was performed and is negative except as noted in the HPI.   Past Medical History:  Diagnosis Date  . Allergy   . Arthritis   . Chronic kidney disease   . Chronic pain   . Coronary artery disease   . Diabetes mellitus without complication ([place])   . Dyspnea   . GERD (gastroesophageal reflux disease)   . Heart murmur   . Hypertension   . Liver disease   . NASH (nonalcoholic steatohepatitis) 09/16/2016  . Obstructive sleep apnea   . Osteomyelitis ([place])   . Peripheral vascular disease ([place])   . Sleep apnea   . Stroke ([place])   . Ulcers of both great toes ([place]) 11/07/2016   right great toe, started as finger nail size and grew in 6 days      Family History  Problem Relation Age of Onset  . Heart disease Father   . Stroke Maternal Grandfather   . Heart disease Paternal Grandfather   . Cancer Mother        Thymoma, metastatic  . Stroke Father   . Multiple sclerosis Sister   . Epilepsy Sister      Social History:  reports that  has never smoked. she has never used smokeless tobacco. She reports that she does not drink  alcohol or use drugs.   Exam: Current vital signs: BP 129/79   Pulse 69   Resp 18   SpO2 94%  Vital signs in last 24 hours: Pulse Rate:  [65-69] 69 (12/01 0115) Resp:  [14-18] 18 (12/01 0115) BP: (117-129)/(65-79) 129/79 (12/01 0115) SpO2:  [87 %-97 %] 94 % (12/01 0115)   Physical Exam  Constitutional: Appears well-developed and well-nourished.  Psych: Affect appropriate to situation Eyes: No scleral injection HENT: No OP obstrucion Head: Normocephalic.  Cardiovascular: Normal rate and regular rhythm.  Respiratory: Effort normal, non-labored breathing GI: Soft.  No distension. There is no tenderness.  Skin: WDI  Neuro: Mental Status: Patient is awake, alert, oriented to person, place, month, year, and situation. Patient is able to give a clear and coherent history. No signs of aphasia or neglect Cranial Nerves: II: Visual Fields are full. Pupils are equal, round, and reactive to light.   III,IV, VI: EOMI without ptosis or diploplia.  V: Facial sensation is decreased on the left VII: Facial movement is decreased on the left VIII: hearing is intact to voice X: Uvula elevates symmetrically XI: Shoulder shrug is symmetric. XII: tongue is midline without atrophy or fasciculations.  Motor: Tone is normal. Bulk is normal. 5/5 strength was present on the  right, she has 4/5 strength in the left arm and leg Sensory: Sensation is diminished in the left arm and bilateral legs (she has diabetic neuropathy) Cerebellar: Finger-nose-finger intact   I have reviewed labs in epic and the results pertinent to this consultation are: CMP-mildly elevated creatinine at 1.59, hyponatremia at 132 markedly elevated glucose of 480  I have reviewed the images obtained: CT head-unremarkable  Impression: 61 year old female with a history of stroke with left-sided weakness who presents with severe migraine associated with recrudescence of her previous symptoms versus new stroke.  Given that  the symptoms are identical to her previous stroke, I think that recrudescence is more likely, but she will need an MRI to confirm no extension of previous stroke.  Recommendations: 1) MRI brain 2) Compazine, Depacon, magnesium 3) further recommendations following MRI   Roland Rack, MD [facility] [phone]  If 7pm- 7am, please page neurology on call as listed in [place].

## 2017-01-03 ENCOUNTER — Inpatient Hospital Stay (HOSPITAL_COMMUNITY): Payer: Medicare HMO

## 2017-01-03 ENCOUNTER — Encounter (HOSPITAL_COMMUNITY): Payer: Medicare HMO

## 2017-01-03 ENCOUNTER — Encounter (HOSPITAL_COMMUNITY): Payer: Self-pay | Admitting: Radiology

## 2017-01-03 DIAGNOSIS — N183 Chronic kidney disease, stage 3 (moderate): Secondary | ICD-10-CM | POA: Diagnosis not present

## 2017-01-03 DIAGNOSIS — R531 Weakness: Secondary | ICD-10-CM | POA: Diagnosis not present

## 2017-01-03 DIAGNOSIS — R2990 Unspecified symptoms and signs involving the nervous system: Secondary | ICD-10-CM | POA: Diagnosis not present

## 2017-01-03 DIAGNOSIS — G459 Transient cerebral ischemic attack, unspecified: Secondary | ICD-10-CM

## 2017-01-03 LAB — GLUCOSE, CAPILLARY
GLUCOSE-CAPILLARY: 237 mg/dL — AB (ref 65–99)
GLUCOSE-CAPILLARY: 236 mg/dL — AB (ref 65–99)
Glucose-Capillary: 243 mg/dL — ABNORMAL HIGH (ref 65–99)
Glucose-Capillary: 226 mg/dL — ABNORMAL HIGH (ref 65–99)

## 2017-01-03 LAB — BASIC METABOLIC PANEL
ANION GAP: 10 (ref 5–15)
BUN: 18 mg/dL (ref 6–20)
CALCIUM: 8.6 mg/dL — AB (ref 8.9–10.3)
CO2: 25 mmol/L (ref 22–32)
Chloride: 101 mmol/L (ref 101–111)
Creatinine, Ser: 1.34 mg/dL — ABNORMAL HIGH (ref 0.44–1.00)
GFR calc non Af Amer: 42 mL/min — ABNORMAL LOW (ref >60)
GFR, EST AFRICAN AMERICAN: 48 mL/min — AB (ref >60)
Glucose, Bld: 237 mg/dL — ABNORMAL HIGH (ref 65–99)
Potassium: 3.5 mmol/L (ref 3.5–5.1)
Sodium: 136 mmol/L (ref 135–145)

## 2017-01-03 LAB — HEMOGLOBIN A1C
Hgb A1c MFr Bld: 12.4 % — ABNORMAL HIGH (ref 4.8–5.6)
Mean Plasma Glucose: 309 mg/dL

## 2017-01-03 LAB — LIPID PANEL
Cholesterol: 245 mg/dL — ABNORMAL HIGH (ref 0–200)
HDL: 21 mg/dL — AB (ref >40)
LDL CALC: UNDETERMINED mg/dL (ref 0–99)
TRIGLYCERIDES: 603 mg/dL — AB (ref <150)
Total CHOL/HDL Ratio: 11.7 RATIO
VLDL: UNDETERMINED mg/dL (ref 0–40)

## 2017-01-03 LAB — ECHOCARDIOGRAM COMPLETE
HEIGHTINCHES: 66 in
Weight: 3648 oz

## 2017-01-03 MED ORDER — EZETIMIBE 10 MG PO TABS
10.0000 mg | ORAL_TABLET | Freq: Every day | ORAL | Status: DC
  Administered 2017-01-04: 10 mg via ORAL
  Filled 2017-01-03: qty 1

## 2017-01-03 MED ORDER — ATORVASTATIN CALCIUM 80 MG PO TABS: 80.0000 mg | ORAL_TABLET | Freq: Every day | ORAL | Status: DC

## 2017-01-03 MED ORDER — IOPAMIDOL (ISOVUE-370) INJECTION 76%
INTRAVENOUS | Status: AC
  Administered 2017-01-03: 50 mL
  Filled 2017-01-03: qty 50

## 2017-01-03 NOTE — Progress Notes (Signed)
Occupational Therapy Evaluation Patient Details Name: [name] MRN: [id number] DOB: [birth year] Today's Date: 01/03/2017    History of Present Illness 61 y.o. female with a history of stroke with left-sided weakness with some residual deficits who presents with worsening of her deficits as well as migraine headache   Clinical Impression   PTA, pt lived at home with her family and was modified independent with ADL and mobility (uses cane)  . Pt states she has felt "weaker" lately and "almost didn't make it up the stairs". Pt overall set up for ADL with the exception of pericare and requires S with mobility @ RW level. Feel pt safe to DC home with assistance of family at [place] level when medically stable. Will plan to see again prior to DC to educate pt on AE for LB ADL and reducing risk of falls.     Follow Up Recommendations  No OT follow up;Supervision - Intermittent    Equipment Recommendations  None recommended by OT    Recommendations for Other Services PT consult     Precautions / Restrictions Precautions Precautions: Fall Required Braces or Orthoses: Other Brace/Splint(Darco shoe - R)      Mobility Bed Mobility Overal bed mobility: Modified Independent                Transfers Overall transfer level: Needs assistance Equipment used: Rolling walker (2 wheeled) Transfers: Sit to/from Stand Sit to Stand: Supervision(with RW; minguard without RW)              Balance Overall balance assessment: Needs assistance   Sitting balance-Leahy Scale: Good       Standing balance-Leahy Scale: Fair                             ADL either performed or assessed with clinical judgement   ADL Overall ADL's : Needs assistance/impaired     Grooming: Modified independent   Upper Body Bathing: Set up   Lower Body Bathing: Minimal assistance Lower Body Bathing Details (indicate cue type and reason): A for pericare due to back pain Upper Body Dressing :  Set up   Lower Body Dressing: Set up   Toilet Transfer: Min guard;Ambulation;Grab bars           Functional mobility during ADLs: Minimal assistance(without RW; 1 LOB)       Vision   Additional Comments: per pt vision is "OK", no changes     Perception     Praxis      Pertinent Vitals/Pain Pain Assessment: Faces Faces Pain Scale: Hurts a little bit Pain Location: back; foot Pain Descriptors / Indicators: Discomfort Pain Intervention(s): Limited activity within patient's tolerance     Hand Dominance Right   Extremity/Trunk Assessment Upper Extremity Assessment Upper Extremity Assessment: Overall WFL for tasks assessed;Generalized weakness(Reports LUE almost at baseline`)   Lower Extremity Assessment Lower Extremity Assessment: Defer to PT evaluation   Cervical / Trunk Assessment Cervical / Trunk Assessment: Normal   Communication Communication Communication: No difficulties   Cognition Arousal/Alertness: Awake/alert Behavior During Therapy: WFL for tasks assessed/performed Overall Cognitive Status: Within Functional Limits for tasks assessed                                     General Comments       Exercises     Shoulder Instructions  Home Living Family/patient expects to be discharged to:: Private residence Living Arrangements: Children Available Help at Discharge: Available 24 hours/day Type of Home: House Home Access: Stairs to enter [employer] of Steps: 5 Entrance Stairs-Rails: Right;Left Home Layout: One level     Bathroom Shower/Tub: Tub/shower unit;Walk-in shower   Bathroom Toilet: Standard Bathroom Accessibility: Yes How Accessible: Accessible via walker Home Equipment: [place] - 4 wheels;Cane - single point;Shower seat          Prior Functioning/Environment Level of Independence: Independent with assistive device(s)        Comments: uses cane; has difficulty with pericare        OT Problem  List: Impaired balance (sitting and/or standing);Decreased strength;Decreased knowledge of use of DME or AE;Obesity      OT Treatment/Interventions: Self-care/ADL training;Therapeutic exercise;Therapeutic activities;Patient/family education;Balance training    OT Goals(Current goals can be found in the care plan section) Acute Rehab OT Goals Patient Stated Goal: to be independent OT Goal Formulation: With patient Time For Goal Achievement: 01/17/17 Potential to Achieve Goals: Good  OT Frequency: Min 2X/week   Barriers to D/C:            Co-evaluation              AM-PAC PT "6 Clicks" Daily Activity     Outcome Measure Help from another person eating meals?: None Help from another person taking care of personal grooming?: None Help from another person toileting, which includes using toliet, bedpan, or urinal?: A Little Help from another person bathing (including washing, rinsing, drying)?: A Little Help from another person to put on and taking off regular upper body clothing?: None Help from another person to put on and taking off regular lower body clothing?: None 6 Click Score: 22   End of Session Equipment Utilized During Treatment: Rolling walker Nurse Communication: Mobility status  Activity Tolerance: Patient tolerated treatment well Patient left: in chair;with call bell/phone within reach;with nursing/sitter in room  OT Visit Diagnosis: Unsteadiness on feet (R26.81);Muscle weakness (generalized) (M62.81);Pain Pain - part of body: (back)                Time: 8206-0156 OT Time Calculation (min): 20 min Charges:  OT General Charges $OT Visit: 1 Visit OT Evaluation $OT Eval Low Complexity: 1 Low G-Codes:     Ashia Dehner, OT/L  [phone] 01/03/2017  Lillymae Duet,HILLARY 01/03/2017, 8:58 AM

## 2017-01-03 NOTE — Progress Notes (Signed)
STROKE TEAM PROGRESS NOTE   HISTORY OF PRESENT ILLNESS (per record) [name] is a 62 y.o. female with a history of stroke with left-sided weakness with some residual deficits who presents with worsening of her deficits as well as migraine headache.  She states that she used to get migraines fairly frequently, but does not have them very often anymore.  She has had maybe one since her last stroke and did not have worsening of her deficits with it.  It was not as bad as her current one.  Currently she has right-sided headache with photophobia it is throbbing in character. She states it is typical for her severe headaches.  Of note, she has a daughter who has a history of complicated migraine.  LKW: 3 PM tpa given?: no, outside of window     SUBJECTIVE (INTERVAL HISTORY)  The patient feels she is improving. An MRI was planned; however, the patient reports that she has a spine stimulator implanted which may not allow for the MRI. A CT angiogram of the head is ordered today as creatinine has improved after hydration  OBJECTIVE Temp:  [97.6 F (36.4 C)-98.9 F (37.2 C)] 97.6 F (36.4 C) (12/02 1008) Pulse Rate:  [61-75] 73 (12/02 1008) Cardiac Rhythm: Normal sinus rhythm (12/02 0700) Resp:  [18-19] 18 (12/02 1008) BP: (108-139)/(49-79) 139/79 (12/02 1008) SpO2:  [95 %-100 %] 96 % (12/02 1008) Weight:  [228 lb (103.4 kg)] 228 lb (103.4 kg) (12/01 1422)  CBC:  Recent Labs  Lab 01/01/17 2205 01/02/17 1204  WBC 3.4* 4.2  NEUTROABS 2.1  --   HGB 11.8* 13.1  HCT 35.8* 39.1  MCV 81.9 81.0  PLT 125* 144*    Basic Metabolic Panel:  Recent Labs  Lab 01/01/17 2205 01/03/17 0258  NA 132* 136  K 3.4* 3.5  CL 97* 101  CO2 26 25  GLUCOSE 480* 237*  BUN 17 18  CREATININE 1.59* 1.34*  CALCIUM 8.5* 8.6*    Lipid Panel:     Component Value Date/Time   CHOL 245 (H) 01/03/2017 0258   TRIG 603 (H) 01/03/2017 0258   HDL 21 (L) 01/03/2017 0258   CHOLHDL 11.7 01/03/2017  0258   VLDL UNABLE TO CALCULATE IF TRIGLYCERIDE OVER 400 mg/dL 01/03/2017 0258   LDLCALC UNABLE TO CALCULATE IF TRIGLYCERIDE OVER 400 mg/dL 01/03/2017 0258   HgbA1c:  Lab Results  Component Value Date   HGBA1C 12.7 11/23/2016   Urine Drug Screen:     Component Value Date/Time   LABOPIA NONE DETECTED 01/01/2017 2222   COCAINSCRNUR NONE DETECTED 01/01/2017 2222   LABBENZ NONE DETECTED 01/01/2017 2222   AMPHETMU NONE DETECTED 01/01/2017 2222   THCU NONE DETECTED 01/01/2017 2222   LABBARB NONE DETECTED 01/01/2017 2222    Alcohol Level     Component Value Date/Time   ETH <10 01/01/2017 2205    IMAGING   Ct Head Wo Contrast 01/01/2017 IMPRESSION:  No CT evidence for acute intracranial abnormality.     MRI Brain WO Contrast - pending 01/02/2017   Transthoracic Echocardiogram 09/21/2016 Study Conclusions  - Procedure narrative: Transthoracic echocardiography. Image   quality was poor. The study was technically difficult, as a   result of poor sound wave transmission and body habitus. - Left ventricle: The cavity size was normal. There was mild focal   basal hypertrophy of the septum. Systolic function was normal.   The estimated ejection fraction was in the range of 60% to 65%.   Wall motion was normal;  there were no regional wall motion   abnormalities. Doppler parameters are consistent with abnormal   left ventricular relaxation (grade 1 diastolic dysfunction). - Ascending aorta: The ascending aorta was mildly dilated.  Impressions:  - Normal LV systolic function; mild diastolic dysfunction;   sclerotic aortic valve; mildly dilated ascending aorta.    Bilateral Carotid Dopplers -see CTA    PHYSICAL EXAM Vitals:   01/02/17 1825 01/02/17 2100 01/03/17 0615 01/03/17 1008  BP: 137/66 (!) 122/49 (!) 108/57 139/79  Pulse: 75 74 68 73  Resp: 18 18 18 18   Temp: 98.9 F (37.2 C) 98.4 F (36.9 C) 98.5 F (36.9 C) 97.6 F (36.4 C)  TempSrc: Oral Oral Oral  Oral  SpO2: 98% 95% 95% 96%  Weight:      Height:      Pleasant middle-age Caucasian lady currently not in distress. . Afebrile. Head is nontraumatic. Neck is supple without bruit.    Cardiac exam no murmur or gallop. Lungs are clear to auscultation. Distal pulses are well felt. Neurological Exam ;  Awake  Alert oriented x 3. Normal speech and language.eye movements full without nystagmus.fundi were not visualized. Vision acuity and fields appear normal. Hearing is normal. Palatal movements are normal. Face symmetric. Tongue midline. Normal strength, tone, reflexes and coordination except for mild diminished fine finger movements on the left and mild left grip and hip is a weakness which are old as per the patient from previous stroke. Normal sensation. Gait deferred.     ASSESSMENT/PLAN Ms. [name] is a 61 y.o. female with history of a previous stroke, obstructive sleep apnea, liver disease, hypertension, history of migraine headaches, diabetes mellitus, coronary artery disease, and chronic kidney disease presenting with left-sided weakness and headache with photophobia. She did not receive IV t-PA due to late presentation.  Stroke vs TIA:  Await further imaging  Resultant  No new deficits. Mild left sided weakness from prior old stroke  CT head - No CT evidence for acute intracranial abnormality.   MRI head - spinal stimulator  MRA head - spinal stimulator  Carotid Doppler - CTA pending  2D Echo - EF 60-65%. No cardiac source of emboli identified.  LDL - unable to calculate due to TG 603  HgbA1c - pending  VTE prophylaxis - subcutaneous heparin Diet Carb Modified Fluid consistency: Thin; Room service appropriate? Yes  No antithrombotic prior to admission, now on clopidogrel 75 mg daily  Patient counseled to be compliant with her antithrombotic medications  Ongoing aggressive stroke risk factor management  Therapy recommendations:  OT none. PT  pending  Disposition:  Pending  Hypertension  Stable  Permissive hypertension (OK if < 220/120) but gradually normalize in 5-7 days  Long-term BP goal normotensive  Hyperlipidemia  Home meds: No lipid lowering medications prior to admission  LDL unable to calculategoal < 70 (history of liver disease)   Diabetes  HgbA1c pending, goal < 7.0  Unc / Controlled  Other Stroke Risk Factors  Advanced age  Obesity, Body mass index is 36.8 kg/m., recommend weight loss, diet and exercise as appropriate   Hx stroke/TIA  Family hx stroke (father and grandfather)  Coronary artery disease  Migraines  Obstructive sleep apnea,  Other Active Problems  Mild thrombocytopenia  Mild hypokalemia  Chronic kidney disease  Spinal stimulator for history of low back pain (currently turned off)   Plan / Recommendations  Consider CTA of head and neck if renal function improves with overnight hydration.  Reported NSAID allergy ->  Plavix (instead of ASA) (was on Plavix in past)  Hospital day # 1  I have personally examined this patient, reviewed notes, independently viewed imaging studies, participated in medical decision making and plan of care.ROS completed by me personally and pertinent positives fully documented  I have made any additions or clarifications directly to the above note.   She presented with worsening of her whole stroke deficits in setting of severe headache. Initial CT scan is negative and it is unclear as to whether patient can get an MRI are not due to her spinal cord stimulator. Recommend Plavix for stroke prevention and check   CT angiogram of the brain and neck  . Continue ongoing stroke workup.  Consider fenoifibrate for elevate triglycerides.  Antony Contras, MD Medical Director [facility] Pager: [phone] 01/03/2017 11:30 AM   To contact Stroke Continuity provider, please refer to [email or web address] After hours, contact General Neurology

## 2017-01-03 NOTE — Evaluation (Signed)
Physical Therapy Evaluation Patient Details Name: [name] MRN: [id number] DOB: [birth year] Today's Date: 01/03/2017   History of Present Illness  61 y.o. female with a history of stroke with left-sided weakness with some residual deficits who presents with worsening of her deficits as well as migraine headache    Clinical Impression  Pt presents at/near baseline functional level, currently dependent on assistive device for safest ambulation and occasional supervision of another person.  Feel safe to return home, needs RW w/5"wheels as current rollator is too unstable. Recommend HHPT to provide strengthening and gait retraining to return pt to highest function possible.      Follow Up Recommendations Home health PT    Equipment Recommendations  Rolling walker with 5" wheels    Recommendations for Other Services       Precautions / Restrictions Precautions Precautions: Fall Required Braces or Orthoses: Other Brace/Splint(wears Darco shoe due to ulcer) Restrictions Weight Bearing Restrictions: No      Mobility  Bed Mobility Overal bed mobility: Modified Independent                Transfers Overall transfer level: Needs assistance Equipment used: Rolling walker (2 wheeled) Transfers: Sit to/from Stand Sit to Stand: Supervision         General transfer comment: standby assist for walking due to weakness and posture (flexed due to back pain); incr time to stand  Ambulation/Gait Ambulation/Gait assistance: Supervision Ambulation Distance (Feet): 150 Feet Assistive device: Rolling walker (2 wheeled) Gait Pattern/deviations: Step-through pattern;Trunk flexed   Gait velocity interpretation: Below normal speed for age/gender General Gait Details: slowed pace; flexed trunk due to back pain, able to find comfortable position but not able to maintain over time; fatiguing but able to complete distance without problems  Stairs            Wheelchair Mobility     Modified Rankin (Stroke Patients Only) Modified Rankin (Stroke Patients Only) Pre-Morbid Rankin Score: No significant disability Modified Rankin: Slight disability     Balance Overall balance assessment: Needs assistance Sitting-balance support: Feet supported;No upper extremity supported Sitting balance-Leahy Scale: Good     Standing balance support: During functional activity;Bilateral upper extremity supported Standing balance-Leahy Scale: Fair                               Pertinent Vitals/Pain Pain Assessment: No/denies pain Faces Pain Scale: Hurts a little bit Pain Location: back; foot Pain Descriptors / Indicators: Aching;Guarding;Discomfort Pain Intervention(s): Limited activity within patient's tolerance;Monitored during session    Home Living Family/patient expects to be discharged to:: Private residence Living Arrangements: Children Available Help at Discharge: Available 24 hours/day Type of Home: House Home Access: Stairs to enter Entrance Stairs-Rails: Psychiatric nurse of Steps: 5 Home Layout: One level Home Equipment: Environmental consultant - 4 wheels;Cane - single point;Shower seat      Prior Function Level of Independence: Independent with assistive device(s)         Comments: uses cane, has 4WW; only used cane "when I really need it"     Hand Dominance   Dominant Hand: Right    Extremity/Trunk Assessment   Upper Extremity Assessment Upper Extremity Assessment: Defer to OT evaluation    Lower Extremity Assessment Lower Extremity Assessment: Overall WFL for tasks assessed    Cervical / Trunk Assessment Cervical / Trunk Assessment: Normal  Communication   Communication: No difficulties  Cognition Arousal/Alertness: Awake/alert Behavior During Therapy: WFL for tasks  assessed/performed Overall Cognitive Status: Within Functional Limits for tasks assessed                                        General  Comments      Exercises     Assessment/Plan    PT Assessment All further PT needs can be met in the next venue of care  PT Problem List Decreased strength;Decreased activity tolerance;Decreased mobility;Decreased knowledge of use of DME       PT Treatment Interventions      PT Goals (Current goals can be found in the Care Plan section)  Acute Rehab PT Goals Patient Stated Goal: to be independent PT Goal Formulation: All assessment and education complete, DC therapy    Frequency     Barriers to discharge        Co-evaluation               AM-PAC PT "6 Clicks" Daily Activity  Outcome Measure Difficulty turning over in bed (including adjusting bedclothes, sheets and blankets)?: A Little Difficulty moving from lying on back to sitting on the side of the bed? : A Little Difficulty sitting down on and standing up from a chair with arms (e.g., wheelchair, bedside commode, etc,.)?: A Little Help needed moving to and from a bed to chair (including a wheelchair)?: A Little Help needed walking in hospital room?: A Little Help needed climbing 3-5 steps with a railing? : A Little 6 Click Score: 18    End of Session   Activity Tolerance: Patient tolerated treatment well Patient left: in chair;with call bell/phone within reach;with nursing/sitter in room Nurse Communication: Mobility status PT Visit Diagnosis: Other abnormalities of gait and mobility (R26.89);Muscle weakness (generalized) (M62.81)    Time: 8413-2440 PT Time Calculation (min) (ACUTE ONLY): 25 min   Charges:   PT Evaluation $PT Eval Low Complexity: 1 Low PT Treatments $Gait Training: 8-22 mins   PT G Codes:   PT G-Codes **NOT FOR INPATIENT CLASS** Functional Assessment Tool Used: AM-PAC 6 Clicks Basic Mobility Functional Limitation: Mobility: Walking and moving around Mobility: Walking and Moving Around Current Status (N0272): At least 40 percent but less than 60 percent impaired, limited or  restricted Mobility: Walking and Moving Around Goal Status [phone]): At least 1 percent but less than 20 percent impaired, limited or restricted Mobility: Walking and Moving Around Discharge Status [phone]): At least 40 percent but less than 60 percent impaired, limited or restricted    Kearney Hard, PT, DPT, MS Board Certified Geriatric Physical Therapist  Herbie Drape 01/03/2017, 12:04 PM

## 2017-01-03 NOTE — Progress Notes (Signed)
  Date: 01/03/2017  Patient name: [name]  Medical record number: [id number]  Date of birth: 1955-04-30   I have seen and evaluated [name] and discussed their care with the Residency Team. Briefly, Ms. [name] is a 61yo woman with PMH of CVA, HTN, OSA, DM2, CKD who presented with new onset of weakness, in the left leg, facial droop on the left and slurred speech.  She had a fall at home and EMS was called.  She further noted this morning left sided sensory changes to the face.  She notes that the symptoms are mostly back to baseline, but that she feels she still has a mild facial droop.  CT head in the ED did not show any acute changes.  Her Glucose was high on admission and this was improved with insulin.  Neurology is also following the patient.  She cannot have an MRI due to an implanted nerve stimulator.    PMHx, Fam Hx, and/or Soc Hx : FH of stroke, never smoker  Vitals:   01/03/17 0615 01/03/17 1008  BP: (!) 108/57 139/79  Pulse: 68 73  Resp: 18 18  Temp: 98.5 F (36.9 C) 97.6 F (36.4 C)  SpO2: 95% 96%   Physical Exam:  Gen: Alert, awake, oriented.  NAD HENT: Neck is supple CV: RR, NR, no murmur Pulm: CTAB, no wheezing Abd: Soft, non distended Neuro: Walking in room with help of an aide.  She has possibly a mild left sided facial droop.  Strength is 4+/5 on the left upper and lower extremity. She has no tongue deviation and she has subjective decreased sensation on the left face.  Psych: Conversant, pleasant.    Pertinent data  Glu 237  Cr 1.34  CT head without abnormality  Assessment and Plan: I have seen and evaluated the patient as outlined above. I agree with the formulated Assessment and Plan as detailed in the residents' note, with the following changes:   1. TIA vs stroke - Neurology following - CTA of head/neck pending for further evaluation - PT/OT evaluation - CD, TTE to be done - Follow up further recommendations - Continue plavix -  Continue telemetry - Hold anti-hypertensives for 24 - 48 hours  2. DM2 - Check A1C - SSI, lantus  Other issues per resident note.    Jamie Falcon, MD 12/2/201811:09 AM

## 2017-01-03 NOTE — Progress Notes (Signed)
   Subjective: Patient stated that she was feeling much better today and was ambulating from the restroom with assistance to her bed upon entering the room.  She believes that the sensation of numbness in the left side of her face has improved as has the strength on the left.  She states that she was informed by neurology she would be staying to at least Monday or Tuesday.  He denied nausea, vomiting, headache, chest pain, palpitations, abdominal pain, or other concerning symptoms.  Objective:  Vital signs in last 24 hours: Vitals:   01/02/17 1504 01/02/17 1825 01/02/17 2100 01/03/17 0615  BP: 124/62 137/66 (!) 122/49 (!) 108/57  Pulse: 70 75 74 68  Resp: 18 18 18 18   Temp: 98.9 F (37.2 C) 98.9 F (37.2 C) 98.4 F (36.9 C) 98.5 F (36.9 C)  TempSrc: Oral Oral Oral Oral  SpO2: 98% 98% 95% 95%  Weight:      Height:       ROS negative except as per HPI.  Physical Exam General: Well-developed, no acute distress HEENT: PERRLA, EOM normal, Cardiovascular: Regular rate and rhythm grade 1-2 systolic murmur appreciated Pulmonary: Breath sounds normal, equal regular effort, no wheezes or stridor Abdomen: soft, nontender, nondistended Musculoskeletal: Edematous, nontender Neurological: CN II through XII grossly intact, strength is 5/5 in the right, 4-5/5 on the left and appears to be precisely the same since admission  Assessment/Plan:  Active Problems:   TIA (transient ischemic attack)  79 yoF with transient facial droop, left sided weakness and slurred speech which had resolved by evaluation at the ED. TIA most likely at this point. Nephrology treating as TIA at this time as we are unable to determine if his current symptoms are residual from previous stroke or new.  TIA: Symptoms resolved after 2-3 hours. No evidence of new symptoms. -Plan for CT angiogram today or tomorrow -Carotid dopplers -TTE-today, -HgbA1c-pending -Lipid Panel resulted-triglycerides 603, Cholesterol 243,  can not calculate LDL -Continue atorvastatin 63m QHS -Continue ASA3215m PO and clopidogrel 7545mO daily -PT/OT and speech evals pending -Tele continued, q4 neuro checks -Still unable to determine if VNS is MRI compatible   DMII: -SSI resistive scale plus 6 units with meals -CBG checks  4 times daily with meals and before bed -Lantus 60 units QHS-unsure if she was receiving proper dose at home -Discontinued CBG hourly checks  HTN: Okay to restart home meds for hypertension. At this time patient is mildly hypotensive, will continue to hold home meds.  Migraine: Had resolved today. Will continue to monitor. -650m59m acetaminophen PRN with darkening of the room and limiting visitors/sound initially, then call MD if inadequate response noted -may consider compazine or migraine cocktail if no response   Depression and anxiety: Continue Sertraline 50mg25mly  CKD III: Creatinine 1.34, GFR 42 Continue fluids but increase to 100 mils an hour to increase hydration prior to CT angiogram patient had CT on 12/1. BMP in a.m. to monitor renal function and electrolytes.  Chronic Pain: Continued home dose of tapentadol 50mg 69mID  Diet: carb modified Fluids: 100ml/h53mde: full Dvt PPX: 5000 units Dispo: Anticipated discharge in approximately 1-2 day(s).   HarbrecKathi Ludwig/03/2016, 8:31 AM Pager: Pager# [phone]

## 2017-01-03 NOTE — Progress Notes (Signed)
Discussed with Family Medicine the patient this morning and agreeable to take over care tomorrow.

## 2017-01-03 NOTE — Progress Notes (Signed)
OT Treatment Note  Pt seen for additional visit per her request as she was unsure of DC plan and wanted information on AE for pericare.     01/03/17 1600  OT Visit Information  Last OT Received On 01/03/17  Assistance Needed +1  History of Present Illness 61 y.o. female with a history of stroke with left-sided weakness with some residual deficits who presents with worsening of her deficits as well as migraine headache  Precautions  Precautions Fall  Required Braces or Orthoses Other Brace/Splint  Other Brace/Splint DArco shoe  Pain Assessment  Pain Assessment No/denies pain  Cognition  Arousal/Alertness Awake/alert  Behavior During Therapy WFL for tasks assessed/performed  Overall Cognitive Status Within Functional Limits for tasks assessed  ADL  General ADL Comments Educated on AE for LB bathing/dressing and pericare. Pt verbalized understanding.   AM-PAC OT "6 Clicks" Daily Activity Outcome Measure  Help from another person eating meals? 4  Help from another person taking care of personal grooming? 4  Help from another person toileting, which includes using toliet, bedpan, or urinal? 3  Help from another person bathing (including washing, rinsing, drying)? 3  Help from another person to put on and taking off regular upper body clothing? 4  Help from another person to put on and taking off regular lower body clothing? 4  6 Click Score 22  ADL G Code Conversion CJ  OT Goal Progression  Progress towards OT goals Progressing toward goals  Acute Rehab OT Goals  Patient Stated Goal to be independent  OT Goal Formulation With patient  Time For Goal Achievement 01/17/17  Potential to Achieve Goals Good  ADL Goals  Pt Will Perform Lower Body Bathing with modified independence;with adaptive equipment  Additional ADL Goal #1 Pt will verbalize 3 strategies to reduce risk of falls  OT Time Calculation  OT Start Time (ACUTE ONLY) 1424  OT Stop Time (ACUTE ONLY) 1435  OT Time  Calculation (min) 11 min  OT General Charges  $OT Visit 1 Visit  OT Treatments  $Self Care/Home Management  8-22 mins  [facility], OT/L  [phone] 01/03/2017

## 2017-01-03 NOTE — Evaluation (Signed)
Speech Language Pathology Evaluation Patient Details Name: Rohini Rands MRN: [id number] DOB: 03/09/1955 Today's Date: 01/03/2017 Time: 6256-3893 SLP Time Calculation (min) (ACUTE ONLY): 21 min  Problem List:  Patient Active Problem List   Diagnosis Date Noted  . Generalized weakness   . TIA (transient ischemic attack) 01/02/2017  . Ulcer of toe of right foot, limited to breakdown of skin ([place]) 12/18/2016  . Non-pressure chronic ulcer of other part of right foot limited to breakdown of skin ([place]) 12/07/2016  . Diabetic foot ulcer ([place]) 12/04/2016  . Hypovolemia 12/04/2016  . Chronic pain syndrome 12/04/2016  . Diabetic peripheral neuropathy ([place]) 12/04/2016  . Hypotension   . Liver mass 09/20/2016  . Kidney disease, chronic, stage III (GFR 30-59 ml/min) ([facility]) 12/17/2015  . Uncontrolled type 2 diabetes mellitus with diabetic polyneuropathy, with long-term current use of insulin ([place]) 07/18/2015  . Essential hypertension 06/06/2015  . Chronic back pain 07/27/2012  . OSA on CPAP 03/21/2012   Past Medical History:  Past Medical History:  Diagnosis Date  . Allergy   . Arthritis   . Chronic kidney disease   . Chronic pain   . Coronary artery disease   . Diabetes mellitus without complication ([place])   . Dyspnea   . GERD (gastroesophageal reflux disease)   . Heart murmur   . Hypertension   . Liver disease   . NASH (nonalcoholic steatohepatitis) 09/16/2016  . Obstructive sleep apnea   . Osteomyelitis ([place])   . Peripheral vascular disease ([place])   . Sleep apnea   . Stroke ([place])   . Ulcers of both great toes ([place]) 11/07/2016   right great toe, started as finger nail size and grew in 6 days    Past Surgical History:  Past Surgical History:  Procedure Laterality Date  . ABDOMINAL HYSTERECTOMY    . BACK SURGERY    . CESAREAN SECTION    . CHOLECYSTECTOMY    . HERNIA REPAIR    . JOINT REPLACEMENT    . KNEE SURGERY    . SPINE SURGERY    . TOE AMPUTATION    . TOE AMPUTATION  Bilateral    3 on right foot 2 left foot   . TONSILLECTOMY    . TUBAL LIGATION     HPI:  61 year old female with PMH significant for CVA (January 2017) with residual left-sided weakness, CAD, T2DM, HTN, and CKD who presents for left-sided weakness and migraine headache, now resolving; CT head on 01/01/17 was negative for acute findings.  Pt stated her speech (slurring) has resolved since first admitted to hospital.  Assessment / Plan / Recommendation Clinical Impression   Pt appears to be functioning within normal limits for speech, language and cognition.  Currently, residual left-sided weakness does not impact speech or swallowing.  Pt does not require any further ST services; ST will s/o at this time. Thank you for the consult.    SLP Assessment  SLP Recommendation/Assessment: Patient does not need any further Speech Language Pathology Services SLP Visit Diagnosis: Other (comment)    Follow Up Recommendations  None    Frequency and Duration   n/a        SLP Evaluation Cognition  Overall Cognitive Status: Within Functional Limits for tasks assessed Arousal/Alertness: Awake/alert Orientation Level: Oriented X4 Memory: Appears intact Awareness: Appears intact Problem Solving: Appears intact Safety/Judgment: Appears intact       Comprehension  Auditory Comprehension Overall Auditory Comprehension: Appears within functional limits for tasks assessed Visual Recognition/Discrimination Discrimination: Within Function Limits  Reading Comprehension Reading Status: Within funtional limits    Expression Expression Primary Mode of Expression: Verbal Verbal Expression Overall Verbal Expression: Appears within functional limits for tasks assessed Level of Generative/Spontaneous Verbalization: Conversation Repetition: No impairment Naming: No impairment Pragmatics: No impairment Non-Verbal Means of Communication: Not applicable Written Expression Dominant Hand: Right Written  Expression: Within Functional Limits   Oral / Motor  Oral Motor/Sensory Function Overall Oral Motor/Sensory Function: Within functional limits Motor Speech Overall Motor Speech: Appears within functional limits for tasks assessed Respiration: Within functional limits Phonation: Normal Resonance: Within functional limits Articulation: Within functional limitis Intelligibility: Intelligible Motor Planning: Witnin functional limits Motor Speech Errors: Not applicable                       Elvina Sidle, M.S., CCC-SLP 01/03/2017, 12:03 PM

## 2017-01-04 ENCOUNTER — Telehealth: Payer: Self-pay | Admitting: Family Medicine

## 2017-01-04 DIAGNOSIS — L97509 Non-pressure chronic ulcer of other part of unspecified foot with unspecified severity: Secondary | ICD-10-CM

## 2017-01-04 DIAGNOSIS — R2990 Unspecified symptoms and signs involving the nervous system: Secondary | ICD-10-CM | POA: Diagnosis not present

## 2017-01-04 DIAGNOSIS — N183 Chronic kidney disease, stage 3 (moderate): Secondary | ICD-10-CM | POA: Diagnosis not present

## 2017-01-04 DIAGNOSIS — E11621 Type 2 diabetes mellitus with foot ulcer: Secondary | ICD-10-CM | POA: Diagnosis not present

## 2017-01-04 DIAGNOSIS — G43109 Migraine with aura, not intractable, without status migrainosus: Secondary | ICD-10-CM

## 2017-01-04 DIAGNOSIS — E782 Mixed hyperlipidemia: Secondary | ICD-10-CM

## 2017-01-04 LAB — BASIC METABOLIC PANEL
ANION GAP: 6 (ref 5–15)
BUN: 15 mg/dL (ref 6–20)
CALCIUM: 8.3 mg/dL — AB (ref 8.9–10.3)
CO2: 26 mmol/L (ref 22–32)
CREATININE: 1.16 mg/dL — AB (ref 0.44–1.00)
Chloride: 105 mmol/L (ref 101–111)
GFR calc Af Amer: 58 mL/min — ABNORMAL LOW (ref >60)
GFR calc non Af Amer: 50 mL/min — ABNORMAL LOW (ref >60)
GLUCOSE: 219 mg/dL — AB (ref 65–99)
Potassium: 3.4 mmol/L — ABNORMAL LOW (ref 3.5–5.1)
Sodium: 137 mmol/L (ref 135–145)

## 2017-01-04 LAB — GLUCOSE, CAPILLARY
Glucose-Capillary: 198 mg/dL — ABNORMAL HIGH (ref 65–99)
Glucose-Capillary: 275 mg/dL — ABNORMAL HIGH (ref 65–99)

## 2017-01-04 MED ORDER — CLOPIDOGREL BISULFATE 75 MG PO TABS: 75.0000 mg | ORAL_TABLET | Freq: Every day | ORAL | 0 refills | Status: DC

## 2017-01-04 MED ORDER — EZETIMIBE 10 MG PO TABS: 10.0000 mg | ORAL_TABLET | Freq: Every day | ORAL | 0 refills | Status: DC

## 2017-01-04 MED ORDER — ATORVASTATIN CALCIUM 80 MG PO TABS: 80.0000 mg | ORAL_TABLET | Freq: Every day | ORAL | 0 refills | Status: DC

## 2017-01-04 NOTE — Progress Notes (Signed)
Inpatient Diabetes Program Recommendations  AACE/ADA: New Consensus Statement on Inpatient Glycemic Control (2015)  Target Ranges:  Prepandial:   less than 140 mg/dL      Peak postprandial:   less than 180 mg/dL (1-2 hours)      Critically ill patients:  140 - 180 mg/dL   Lab Results  Component Value Date   GLUCAP 275 (H) 01/04/2017   HGBA1C 12.4 (H) 01/02/2017    Review of Glycemic Control Results for [name] (MRN [id number]) as of 01/04/2017 11:30  Ref. Range 01/03/2017 17:18 01/03/2017 21:50 01/04/2017 04:55 01/04/2017 06:28 01/04/2017 11:17  Glucose-Capillary Latest Ref Range: 65 - 99 mg/dL 226 (H) 236 (H)  198 (H) 275 (H)   Diabetes history: Type 2 DM, A1c 12.4% Outpatient Diabetes medications: Byetta 32mg BID with meals, Novolog 10 Units TID, Lantus 60 Units BID Current orders for Inpatient glycemic control: Novolog 0-5 HS, Novolog 0-20 Resistant correction, Novolog 6 Units with meals tid, Lantus 60 Units BID  Inpatient Diabetes Program Recommendations:    Assuming patient is eating at 100% with meals, consider increasing Novolog meal coverage to 10 Units TID. Noted A1C is 12.4% will plan to see this patient today.   Thanks,  LBronson Curb MSN, RNC-OB Diabetes Coordinator [phone](8a-5p)

## 2017-01-04 NOTE — Progress Notes (Signed)
Family Medicine Teaching Service Daily Progress Note Intern Pager: [phone]  Patient name: [name] Medical record number: [id number] Date of birth: 11-14-1955 Age: 61 y.o. Gender: female  Primary Care Provider: Forrest Moron, MD Consultants: neurology  Code Status: full   Pt Overview and Major Events to Date:  Admitted to internal medicine service on 01/01/17 Transferred care to [place] on 01/04/17  Assessment and Plan: 55 yoF with transient facial droop, left sided weakness and slurred speech which had resolved by evaluation at the ED. TIA most likely at this point. Nephrology treating as TIA at this time as we are unable to determine if his current symptoms are residual from previous stroke or new.  TIA: Patient initially presenting with left sided weakness and facial droop, symptoms resolved after 2-3 hours. No evidence of new symptoms. 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 Echo showing EF 84-69%, grade 1 diastolic dysfunction.  -Lipid Panel resulted-triglycerides 603, Cholesterol 243, can not calculate LDL -Continue atorvastatin 71m QHS -Continue ASA 3214mand clopidogrel 7545mO daily -PT/OT and speech. PT recommendations of Home health PT  -Tele continued, q4 neuro checks  DMII: Appears to be chronically uncontrolled. Hemoglobin A1C of 12.4 on 01/02/17.  -SSI resistive scale plus 6 units with meals -CBG checks  4 times daily with meals and before bed -Lantus 60 units QHS-unsure if she was receiving proper dose at home  HTN: Current BP 150/81. Per neurology, permissive HTN <220/120 with gradual normalization in 5-7 days.  -currently holding BP meds  Migraine: No current migraines. Reports having migraines in the hospital, given tylenol for treatment. Prefers conservative measures as her insurance would not pay for medications as outpatient. Would consider long term medications if insurance will cover them. Will continue to  monitor. -650m59m acetaminophen PRN with darkening of the room and limiting visitors/sound initially, then call MD if inadequate response noted -may consider compazine or migraine cocktail if no response   Depression and anxiety: -Continue Sertraline 50mg64mly  CKD III Creatinine 1.16, GFR 50 -Continue fluids @ 100 cc/hr to increase hydration prior to CT angiogram, patient had CT on 12/1. -am BMP to monitor renal function and electrolytes.  Chronic Pain: Continued home dose of tapentadol 50mg 64mID  FEN/GI: carb modified, NS @ 100ml/h66mPx: heparin   Disposition: home with home health   Subjective:  Patient today states improvement since admission. States she has been having migraines while hospitalized but at home only uses conservative measures of sitting in a dark room and taking a nap. Patient states that insurance did not cover medications and so she does not use any at home.   Objective: Temp:  [97.8 F (36.6 C)-98.4 F (36.9 C)] 98 F (36.7 C) (12/03 0922) Pulse Rate:  [66-86] 73 (12/03 0922) Resp:  [18-20] 20 (12/03 0922) BP: (132-161)/(54-85) 140/68 (12/03 0922) SpO2:  [96 %-99 %] 99 % (12/03 0922) [id number] Exam: General: awake and alert, sitting in bed, NAD Cardiovascular: RRR, no MRG, no carotid bruits noted  Respiratory: CTAB, no wheezes, rales, or rhonchi  Abdomen: soft, non tender, bowel sounds normal, distended  Extremities: no edema, non tender Neuro: CN 2-12 grossly intact, 5/5 strength on right upper extremity, 4/5 strength in right upper extremity, 4/5 strength in right lower extremity, 3/5 strength in left lower extremity, sensation diminished in right side    Laboratory: Recent Labs  Lab 01/01/17 2205 01/02/17 1204  WBC 3.4* 4.2  HGB 11.8* 13.1  HCT 35.8* 39.1  PLT 125* 144*   Recent Labs  Lab 01/01/17 2205 01/03/17 0258 01/04/17 0455  NA 132* 136 137  K 3.4* 3.5 3.4*  CL 97* 101 105  CO2 26 25 26   BUN 17 18 15   CREATININE 1.59*  1.34* 1.16*  CALCIUM 8.5* 8.6* 8.3*  PROT 6.0*  --   --   BILITOT 0.6  --   --   ALKPHOS 84  --   --   ALT 18  --   --   AST 23  --   --   GLUCOSE 480* 237* 219*     Imaging/Diagnostic Tests: Ct Angio Head W Or Wo Contrast  Result Date: 01/03/2017 CLINICAL DATA:  Follow-up stroke. History of hypertension, diabetes. EXAM: CT ANGIOGRAPHY HEAD AND NECK TECHNIQUE: Multidetector CT imaging of the head and neck was performed using the standard protocol during bolus administration of intravenous contrast. Multiplanar CT image reconstructions and MIPs were obtained to evaluate the vascular anatomy. Carotid stenosis measurements (when applicable) are obtained utilizing NASCET criteria, using the distal internal carotid diameter as the denominator. CONTRAST:  8m ISOVUE-370 IOPAMIDOL (ISOVUE-370) INJECTION 76% COMPARISON:  CT HEAD January 01, 2017 FINDINGS: CT HEAD FINDINGS BRAIN: No intraparenchymal hemorrhage, mass effect nor midline shift. The ventricles and sulci are normal. No acute large vascular territory infarcts. No abnormal extra-axial fluid collections. Basal cisterns are patent. VASCULAR: Mild to moderate calcific atherosclerosis carotid siphon. SKULL/SOFT TISSUES: No skull fracture. No significant soft tissue swelling. ORBITS/SINUSES: The included ocular globes and orbital contents are normal.The mastoid aircells and included paranasal sinuses are well-aerated. OTHER: None. CTA NECK AORTIC ARCH: 3.5 cm ascending aorta. Normal 3 vessel aortic arch with mild intimal thickening and calcific atherosclerosis. The origins of the innominate, left Common carotid artery and subclavian artery are widely patent. Mild motion artifact through proximal RIGHT Common carotid artery. RIGHT CAROTID SYSTEM: Common carotid artery is widely patent, coursing in a straight line fashion. Normal appearance of the carotid bifurcation without hemodynamically significant stenosis by NASCET criteria. Normal appearance of the  internal carotid artery. LEFT CAROTID SYSTEM: Common carotid artery is widely patent, coursing in a straight line fashion. Normal appearance of the carotid bifurcation without hemodynamically significant stenosis by NASCET criteria. Normal appearance of the internal carotid artery. VERTEBRAL ARTERIES:RIGHT vertebral artery is dominant. Normal appearance of the vertebral arteries, widely patent. SKELETON: No acute osseous process though bone windows have not been submitted. Multilevel Schmorl's nodes and moderate facet arthropathy. OTHER NECK: Soft tissues of the neck are nonacute though, not tailored for evaluation. 12 mm LEFT thyroid nodule, no dominant nodule by a follow-up size criteria. Mild thyromegaly. UPPER CHEST: Included lung apices are clear. No superior mediastinal lymphadenopathy. CTA HEAD ANTERIOR CIRCULATION: Patent cervical internal carotid arteries, petrous, cavernous and supra clinoid internal carotid arteries. Atherosclerosis in resulting in moderate stenosis LEFT paraclinoid internal carotid artery. Patent anterior communicating artery. Patent anterior and middle cerebral arteries. Mild luminal irregularity bilateral anterior and middle cerebral artery's. No large vessel occlusion, significant stenosis, contrast extravasation or aneurysm. POSTERIOR CIRCULATION: Patent vertebral arteries, vertebrobasilar junction and basilar artery, as well as main branch vessels. Patent posterior cerebral arteries, mild luminal irregularity. No large vessel occlusion, significant stenosis, contrast extravasation or aneurysm. VENOUS SINUSES: Major dural venous sinuses are patent though not tailored for evaluation on this angiographic examination. ANATOMIC VARIANTS: Fenestrated RIGHT A1 and LEFT M1 segments. DELAYED PHASE: No abnormal intracranial enhancement. MIP images reviewed. IMPRESSION: CT HEAD: 1. No acute intracranial process. 2. Mild to moderate atherosclerosis, otherwise  negative CT HEAD with and without  contrast for age. CTA NECK: 1. No hemodynamically significant stenosis or acute vascular process. 2. **An incidental finding of potential clinical significance has been found. 3.5 cm ascending aorta. Recommend annual imaging followup by CTA or MRA. This recommendation follows 2010 ACCF/AHA/AATS/ACR/ASA/SCA/SCAI/SIR/STS/SVM Guidelines for the Diagnosis and Management of Patients with Thoracic Aortic Disease. Circulation.2010; 121: R416-L845** CTA HEAD: 1. No emergent large vessel occlusion. 2. 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 3. Mild atherosclerosis cerebral artery's. Aortic Atherosclerosis (ICD10-I70.0). Electronically Signed   By: Elon Alas M.D.   On: 01/03/2017 17:36   Ct Head Wo Contrast  Result Date: 01/01/2017 CLINICAL DATA:  Frontal headache with dizziness EXAM: CT HEAD WITHOUT CONTRAST TECHNIQUE: Contiguous axial images were obtained from the base of the skull through the vertex without intravenous contrast. COMPARISON:  None. FINDINGS: Brain: No acute territorial infarction, hemorrhage or intracranial mass is visualized. The ventricles are nonenlarged. Vascular: No hyperdense vessels.  Carotid artery calcification. Skull: Normal. Negative for fracture or focal lesion. Sinuses/Orbits: Mild mucosal thickening of the ethmoid sinuses. No acute orbital abnormality. Other: None IMPRESSION: No CT evidence for acute intracranial abnormality. Electronically Signed   By: Donavan Foil M.D.   On: 01/01/2017 22:03   Ct Angio Neck W Or Wo Contrast  Result Date: 01/03/2017 CLINICAL DATA:  Follow-up stroke. History of hypertension, diabetes. EXAM: CT ANGIOGRAPHY HEAD AND NECK TECHNIQUE: Multidetector CT imaging of the head and neck was performed using the standard protocol during bolus administration of intravenous contrast. Multiplanar CT image reconstructions and MIPs were obtained to evaluate the vascular anatomy. Carotid stenosis measurements (when applicable) are obtained  utilizing NASCET criteria, using the distal internal carotid diameter as the denominator. CONTRAST:  55m ISOVUE-370 IOPAMIDOL (ISOVUE-370) INJECTION 76% COMPARISON:  CT HEAD January 01, 2017 FINDINGS: CT HEAD FINDINGS BRAIN: No intraparenchymal hemorrhage, mass effect nor midline shift. The ventricles and sulci are normal. No acute large vascular territory infarcts. No abnormal extra-axial fluid collections. Basal cisterns are patent. VASCULAR: Mild to moderate calcific atherosclerosis carotid siphon. SKULL/SOFT TISSUES: No skull fracture. No significant soft tissue swelling. ORBITS/SINUSES: The included ocular globes and orbital contents are normal.The mastoid aircells and included paranasal sinuses are well-aerated. OTHER: None. CTA NECK AORTIC ARCH: 3.5 cm ascending aorta. Normal 3 vessel aortic arch with mild intimal thickening and calcific atherosclerosis. The origins of the innominate, left Common carotid artery and subclavian artery are widely patent. Mild motion artifact through proximal RIGHT Common carotid artery. RIGHT CAROTID SYSTEM: Common carotid artery is widely patent, coursing in a straight line fashion. Normal appearance of the carotid bifurcation without hemodynamically significant stenosis by NASCET criteria. Normal appearance of the internal carotid artery. LEFT CAROTID SYSTEM: Common carotid artery is widely patent, coursing in a straight line fashion. Normal appearance of the carotid bifurcation without hemodynamically significant stenosis by NASCET criteria. Normal appearance of the internal carotid artery. VERTEBRAL ARTERIES:RIGHT vertebral artery is dominant. Normal appearance of the vertebral arteries, widely patent. SKELETON: No acute osseous process though bone windows have not been submitted. Multilevel Schmorl's nodes and moderate facet arthropathy. OTHER NECK: Soft tissues of the neck are nonacute though, not tailored for evaluation. 12 mm LEFT thyroid nodule, no dominant nodule by  a follow-up size criteria. Mild thyromegaly. UPPER CHEST: Included lung apices are clear. No superior mediastinal lymphadenopathy. CTA HEAD ANTERIOR CIRCULATION: Patent cervical internal carotid arteries, petrous, cavernous and supra clinoid internal carotid arteries. Atherosclerosis in resulting in moderate stenosis LEFT paraclinoid internal carotid artery. Patent anterior  communicating artery. Patent anterior and middle cerebral arteries. Mild luminal irregularity bilateral anterior and middle cerebral artery's. No large vessel occlusion, significant stenosis, contrast extravasation or aneurysm. POSTERIOR CIRCULATION: Patent vertebral arteries, vertebrobasilar junction and basilar artery, as well as main branch vessels. Patent posterior cerebral arteries, mild luminal irregularity. No large vessel occlusion, significant stenosis, contrast extravasation or aneurysm. VENOUS SINUSES: Major dural venous sinuses are patent though not tailored for evaluation on this angiographic examination. ANATOMIC VARIANTS: Fenestrated RIGHT A1 and LEFT M1 segments. DELAYED PHASE: No abnormal intracranial enhancement. MIP images reviewed. IMPRESSION: CT HEAD: 1. No acute intracranial process. 2. Mild to moderate atherosclerosis, otherwise negative CT HEAD with and without contrast for age. CTA NECK: 1. No hemodynamically significant stenosis or acute vascular process. 2. **An incidental finding of potential clinical significance has been found. 3.5 cm ascending aorta. Recommend annual imaging followup by CTA or MRA. This recommendation follows 2010 ACCF/AHA/AATS/ACR/ASA/SCA/SCAI/SIR/STS/SVM Guidelines for the Diagnosis and Management of Patients with Thoracic Aortic Disease. Circulation.2010; 121: Y503-T465** CTA HEAD: 1. No emergent large vessel occlusion. 2. 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 3. Mild atherosclerosis cerebral artery's. Aortic Atherosclerosis (ICD10-I70.0). Electronically Signed   By:  Elon Alas M.D.   On: 01/03/2017 17:36   Dg Foot 2 Views Right  Result Date: 12/11/2016 Please see detailed radiograph report in office note.    Caroline More, DO 01/04/2017, 1:44 PM PGY-1, [place] Intern pager: [phone], text pages welcome

## 2017-01-04 NOTE — Telephone Encounter (Addendum)
Please clarify the dosage of Novolog insulin so pt can get it . She is currently admitted at [facility] and will be discharged today. She is out of the Novolog.

## 2017-01-04 NOTE — Consult Note (Addendum)
[place] Nurse wound consult note Reason for Consult: Consult requested for right foot.  Pt has chronic full thickness wounds and has been followed by Dr Sharol Given of the ortho service prior to admission.  Wound type: Full thickness wound to right plantar foot; 2X.3X.2cm, red and moist, surrounded by dry callous.  5th toe with full thickness wound; .8X.8X.2cm, surrounded by dry callous, moist red woundbed with small amt yellow drainage, no odor. Dressing procedure/placement/frequency: Pt states she has been using Progran for topical treatment, which is not available in the [facility].  Discussed with patient and she agreed to substitute Aquacel while in the hospital. She plans to follow-up with Dr Sharol Given after discharge. Please re-consult if further assistance is needed.  Thank-you,  Julien Girt MSN, [place], [place], [place], [place]

## 2017-01-04 NOTE — Care Management CC44 (Signed)
Condition Code 44 Documentation Completed  Patient Details  Name: [name] MRN: [id number] Date of Birth: 1955-05-09   Condition Code 44 given:  Yes Patient signature on Condition Code 44 notice:  Yes Documentation of 2 MD's agreement:  Yes Code 44 added to claim:  Yes    Pollie Friar, RN 01/04/2017, 12:46 PM

## 2017-01-04 NOTE — Care Management Obs Status (Signed)
[place] NOTIFICATION   Patient Details  Name: [name] MRN: [id number] Date of Birth: [birth year]   Medicare Observation Status Notification Given:  Yes    Pollie Friar, RN 01/04/2017, 12:46 PM

## 2017-01-04 NOTE — Discharge Summary (Signed)
[facility] Discharge Summary  Patient name: [name] Medical record number: [id number] Date of birth: 03/12/1955 Age: 61 y.o. Gender: female Date of Admission: 01/01/2017  Date of Discharge: 01/04/17 Admitting Physician: Sid Falcon, MD  Primary Care Provider: Forrest Moron, MD Consultants: neurology   Indication for Hospitalization: TIA  Discharge Diagnoses/Problem List:  TIA DMII HTN Migraine Depression/anxiety CKD III Chronic pain   Disposition: home with home health   Discharge Condition: stable, improving   Discharge Exam:  Please see progress note from date of discharge  Brief Hospital Course:  [name] is a 61 y.o. female presenting for stroke work up after having left sided weakness/facial droop. Patient was initially admitted to internal medicine service for work up but transitioned to [facility] on day of discharge. Patient was followed by neurology who diagnosed patient with TIA after patient's symptoms resolved and work up completed. Patient had CTA showing moderate stenosis of left paraclinoid internal carotid artery. Echo showing EF 97-41%, grade 1 diastolic dysfunction. Lipid panel showed elevated triglycerides to 603. And A1C was elevated at 12.4. Patient had several risk factors for stroke including advanced age, obesity, hx of stroke/tia, family hx of stroke, CAD, migraines, and OSA. Patient was started on atorvastatin 27m qhs and clopidogrel 749mdaily. Patient was discharged with home health PT.    Issues for Follow Up:  1. CTA showing 3.5cm ascending aorta. Will need annual imaging followup by either CTA or MRA.  2. Patient seen to be uncontrolled on diabetes. Recommend close management and follow up. Unclear whether patient is taking lantus appropriately.  3. Patient with history of migraine headache. Patient stated that previous medications were denied by insurance. Patient should check formulary for which  medications are covered and needs further treatment if uncontrolled.  4. Patient started on atorvastatin, clopidogrel, and ezetimibe 5. Follow up with Dr. DuSharol Givenor wound on right foot   6. Outpatient neurology follow up in 6 weeks   Significant Procedures: none  Significant Labs and Imaging:  Recent Labs  Lab 01/01/17 2205 01/02/17 1204  WBC 3.4* 4.2  HGB 11.8* 13.1  HCT 35.8* 39.1  PLT 125* 144*   Recent Labs  Lab 01/01/17 2205 01/03/17 0258 01/04/17 0455  NA 132* 136 137  K 3.4* 3.5 3.4*  CL 97* 101 105  CO2 26 25 26   GLUCOSE 480* 237* 219*  BUN 17 18 15   CREATININE 1.59* 1.34* 1.16*  CALCIUM 8.5* 8.6* 8.3*  ALKPHOS 84  --   --   AST 23  --   --   ALT 18  --   --   ALBUMIN 2.9*  --   --     Ct Angio Head W Or Wo Contrast  Result Date: 01/03/2017 CLINICAL DATA:  Follow-up stroke. History of hypertension, diabetes. EXAM: CT ANGIOGRAPHY HEAD AND NECK TECHNIQUE: Multidetector CT imaging of the head and neck was performed using the standard protocol during bolus administration of intravenous contrast. Multiplanar CT image reconstructions and MIPs were obtained to evaluate the vascular anatomy. Carotid stenosis measurements (when applicable) are obtained utilizing NASCET criteria, using the distal internal carotid diameter as the denominator. CONTRAST:  5068mSOVUE-370 IOPAMIDOL (ISOVUE-370) INJECTION 76% COMPARISON:  CT HEAD January 01, 2017 FINDINGS: CT HEAD FINDINGS BRAIN: No intraparenchymal hemorrhage, mass effect nor midline shift. The ventricles and sulci are normal. No acute large vascular territory infarcts. No abnormal extra-axial fluid collections. Basal cisterns are patent. VASCULAR: Mild to moderate calcific atherosclerosis carotid siphon.  SKULL/SOFT TISSUES: No skull fracture. No significant soft tissue swelling. ORBITS/SINUSES: The included ocular globes and orbital contents are normal.The mastoid aircells and included paranasal sinuses are well-aerated. OTHER: None.  CTA NECK AORTIC ARCH: 3.5 cm ascending aorta. Normal 3 vessel aortic arch with mild intimal thickening and calcific atherosclerosis. The origins of the innominate, left Common carotid artery and subclavian artery are widely patent. Mild motion artifact through proximal RIGHT Common carotid artery. RIGHT CAROTID SYSTEM: Common carotid artery is widely patent, coursing in a straight line fashion. Normal appearance of the carotid bifurcation without hemodynamically significant stenosis by NASCET criteria. Normal appearance of the internal carotid artery. LEFT CAROTID SYSTEM: Common carotid artery is widely patent, coursing in a straight line fashion. Normal appearance of the carotid bifurcation without hemodynamically significant stenosis by NASCET criteria. Normal appearance of the internal carotid artery. VERTEBRAL ARTERIES:RIGHT vertebral artery is dominant. Normal appearance of the vertebral arteries, widely patent. SKELETON: No acute osseous process though bone windows have not been submitted. Multilevel Schmorl's nodes and moderate facet arthropathy. OTHER NECK: Soft tissues of the neck are nonacute though, not tailored for evaluation. 12 mm LEFT thyroid nodule, no dominant nodule by a follow-up size criteria. Mild thyromegaly. UPPER CHEST: Included lung apices are clear. No superior mediastinal lymphadenopathy. CTA HEAD ANTERIOR CIRCULATION: Patent cervical internal carotid arteries, petrous, cavernous and supra clinoid internal carotid arteries. Atherosclerosis in resulting in moderate stenosis LEFT paraclinoid internal carotid artery. Patent anterior communicating artery. Patent anterior and middle cerebral arteries. Mild luminal irregularity bilateral anterior and middle cerebral artery's. No large vessel occlusion, significant stenosis, contrast extravasation or aneurysm. POSTERIOR CIRCULATION: Patent vertebral arteries, vertebrobasilar junction and basilar artery, as well as main branch vessels. Patent  posterior cerebral arteries, mild luminal irregularity. No large vessel occlusion, significant stenosis, contrast extravasation or aneurysm. VENOUS SINUSES: Major dural venous sinuses are patent though not tailored for evaluation on this angiographic examination. ANATOMIC VARIANTS: Fenestrated RIGHT A1 and LEFT M1 segments. DELAYED PHASE: No abnormal intracranial enhancement. MIP images reviewed. IMPRESSION: CT HEAD: 1. No acute intracranial process. 2. Mild to moderate atherosclerosis, otherwise negative CT HEAD with and without contrast for age. CTA NECK: 1. No hemodynamically significant stenosis or acute vascular process. 2. **An incidental finding of potential clinical significance has been found. 3.5 cm ascending aorta. Recommend annual imaging followup by CTA or MRA. This recommendation follows 2010 ACCF/AHA/AATS/ACR/ASA/SCA/SCAI/SIR/STS/SVM Guidelines for the Diagnosis and Management of Patients with Thoracic Aortic Disease. Circulation.2010; 121: B638-G536** CTA HEAD: 1. No emergent large vessel occlusion. 2. 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 3. Mild atherosclerosis cerebral artery's. Aortic Atherosclerosis (ICD10-I70.0). Electronically Signed   By: Elon Alas M.D.   On: 01/03/2017 17:36   Ct Head Wo Contrast  Result Date: 01/01/2017 CLINICAL DATA:  Frontal headache with dizziness EXAM: CT HEAD WITHOUT CONTRAST TECHNIQUE: Contiguous axial images were obtained from the base of the skull through the vertex without intravenous contrast. COMPARISON:  None. FINDINGS: Brain: No acute territorial infarction, hemorrhage or intracranial mass is visualized. The ventricles are nonenlarged. Vascular: No hyperdense vessels.  Carotid artery calcification. Skull: Normal. Negative for fracture or focal lesion. Sinuses/Orbits: Mild mucosal thickening of the ethmoid sinuses. No acute orbital abnormality. Other: None IMPRESSION: No CT evidence for acute intracranial abnormality.  Electronically Signed   By: Donavan Foil M.D.   On: 01/01/2017 22:03   Ct Angio Neck W Or Wo Contrast  Result Date: 01/03/2017 CLINICAL DATA:  Follow-up stroke. History of hypertension, diabetes. EXAM: CT ANGIOGRAPHY HEAD AND NECK TECHNIQUE:  Multidetector CT imaging of the head and neck was performed using the standard protocol during bolus administration of intravenous contrast. Multiplanar CT image reconstructions and MIPs were obtained to evaluate the vascular anatomy. Carotid stenosis measurements (when applicable) are obtained utilizing NASCET criteria, using the distal internal carotid diameter as the denominator. CONTRAST:  25m ISOVUE-370 IOPAMIDOL (ISOVUE-370) INJECTION 76% COMPARISON:  CT HEAD January 01, 2017 FINDINGS: CT HEAD FINDINGS BRAIN: No intraparenchymal hemorrhage, mass effect nor midline shift. The ventricles and sulci are normal. No acute large vascular territory infarcts. No abnormal extra-axial fluid collections. Basal cisterns are patent. VASCULAR: Mild to moderate calcific atherosclerosis carotid siphon. SKULL/SOFT TISSUES: No skull fracture. No significant soft tissue swelling. ORBITS/SINUSES: The included ocular globes and orbital contents are normal.The mastoid aircells and included paranasal sinuses are well-aerated. OTHER: None. CTA NECK AORTIC ARCH: 3.5 cm ascending aorta. Normal 3 vessel aortic arch with mild intimal thickening and calcific atherosclerosis. The origins of the innominate, left Common carotid artery and subclavian artery are widely patent. Mild motion artifact through proximal RIGHT Common carotid artery. RIGHT CAROTID SYSTEM: Common carotid artery is widely patent, coursing in a straight line fashion. Normal appearance of the carotid bifurcation without hemodynamically significant stenosis by NASCET criteria. Normal appearance of the internal carotid artery. LEFT CAROTID SYSTEM: Common carotid artery is widely patent, coursing in a straight line fashion. Normal  appearance of the carotid bifurcation without hemodynamically significant stenosis by NASCET criteria. Normal appearance of the internal carotid artery. VERTEBRAL ARTERIES:RIGHT vertebral artery is dominant. Normal appearance of the vertebral arteries, widely patent. SKELETON: No acute osseous process though bone windows have not been submitted. Multilevel Schmorl's nodes and moderate facet arthropathy. OTHER NECK: Soft tissues of the neck are nonacute though, not tailored for evaluation. 12 mm LEFT thyroid nodule, no dominant nodule by a follow-up size criteria. Mild thyromegaly. UPPER CHEST: Included lung apices are clear. No superior mediastinal lymphadenopathy. CTA HEAD ANTERIOR CIRCULATION: Patent cervical internal carotid arteries, petrous, cavernous and supra clinoid internal carotid arteries. Atherosclerosis in resulting in moderate stenosis LEFT paraclinoid internal carotid artery. Patent anterior communicating artery. Patent anterior and middle cerebral arteries. Mild luminal irregularity bilateral anterior and middle cerebral artery's. No large vessel occlusion, significant stenosis, contrast extravasation or aneurysm. POSTERIOR CIRCULATION: Patent vertebral arteries, vertebrobasilar junction and basilar artery, as well as main branch vessels. Patent posterior cerebral arteries, mild luminal irregularity. No large vessel occlusion, significant stenosis, contrast extravasation or aneurysm. VENOUS SINUSES: Major dural venous sinuses are patent though not tailored for evaluation on this angiographic examination. ANATOMIC VARIANTS: Fenestrated RIGHT A1 and LEFT M1 segments. DELAYED PHASE: No abnormal intracranial enhancement. MIP images reviewed. IMPRESSION: CT HEAD: 1. No acute intracranial process. 2. Mild to moderate atherosclerosis, otherwise negative CT HEAD with and without contrast for age. CTA NECK: 1. No hemodynamically significant stenosis or acute vascular process. 2. **An incidental finding of  potential clinical significance has been found. 3.5 cm ascending aorta. Recommend annual imaging followup by CTA or MRA. This recommendation follows 2010 ACCF/AHA/AATS/ACR/ASA/SCA/SCAI/SIR/STS/SVM Guidelines for the Diagnosis and Management of Patients with Thoracic Aortic Disease. Circulation.2010; 121: [id number]* CTA HEAD: 1. No emergent large vessel occlusion. 2. 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 3. Mild atherosclerosis cerebral artery's. Aortic Atherosclerosis (ICD10-I70.0). Electronically Signed   By: CElon AlasM.D.   On: 01/03/2017 17:36   Dg Foot 2 Views Right  Result Date: 12/11/2016 Please see detailed radiograph report in office note.    Results/Tests Pending at Time of Discharge:  Unresulted Labs (From  admission, onward)   None      Discharge Medications:  Allergies as of 01/04/2017      Reactions   Lasix [furosemide]    Dehydration leading to kidney failure    Norvasc [amlodipine Besylate]    Severe edema   Prednisone Other (See Comments)   Ketoacidosis   Vancomycin Other (See Comments)   Total organ shut down   Doxycycline Hives   Ibuprofen Other (See Comments)   WAS TOLD TO NOT TAKE THIS BECAUSE OF HER KIDNEYS AND LIVER   Inderal [propranolol] Cough   Lisinopril Other (See Comments)   WAS TOLD TO NOT TAKE THIS BECAUSE OF HER KIDNEYS AND LIVER   Lovastatin Other (See Comments)   WAS TOLD TO NOT TAKE THIS BECAUSE OF HER KIDNEYS AND LIVER   Norvasc [amlodipine Besylate] Other (See Comments)   Severe edema   Nsaids Other (See Comments)   WAS TOLD TO NOT TAKE THIS BECAUSE OF HER KIDNEYS AND LIVER   Sulfa Antibiotics Nausea Only      Medication List    TAKE these medications   atorvastatin 80 MG tablet Commonly known as:  LIPITOR Take 1 tablet (80 mg total) by mouth daily at 6 PM.   busPIRone 15 MG tablet Commonly known as:  BUSPAR Take 1 tablet (15 mg total) by mouth 3 (three) times daily.   carvedilol 25 MG  tablet Commonly known as:  COREG Take 1 tablet (25 mg total) by mouth 2 (two) times daily.   clopidogrel 75 MG tablet Commonly known as:  PLAVIX Take 1 tablet (75 mg total) by mouth daily.   exenatide 5 MCG/0.02ML Sopn injection Commonly known as:  BYETTA 5 MCG PEN Inject 0.02 mLs (5 mcg total) into the skin 2 (two) times daily with a meal.   ezetimibe 10 MG tablet Commonly known as:  ZETIA Take 1 tablet (10 mg total) by mouth daily.   gabapentin 300 MG capsule Commonly known as:  NEURONTIN Take 1 capsule (300 mg total) by mouth 3 (three) times daily.   hydrochlorothiazide 25 MG tablet Commonly known as:  HYDRODIURIL Take 1 tablet (25 mg total) by mouth daily.   insulin aspart 100 UNIT/ML FlexPen Commonly known as:  NOVOLOG Inject 10 Units into the skin 3 (three) times daily before meals. "PER A SLIDING SCALE OF AN ADDITIONAL 2-10 UNITS"   LANTUS SOLOSTAR 100 UNIT/ML Solostar Pen Generic drug:  Insulin Glargine Inject 60 Units into the skin 2 (two) times daily. MORNING and BEDTIME   sertraline 50 MG tablet Commonly known as:  ZOLOFT Take 1 tablet (50 mg total) by mouth daily.   tapentadol 50 MG tablet Commonly known as:  NUCYNTA Take 1 tablet (50 mg total) by mouth 3 (three) times daily.       Discharge Instructions: Please refer to Patient Instructions section of EMR for full details.  Patient was counseled important signs and symptoms that should prompt return to medical care, changes in medications, dietary instructions, activity restrictions, and follow up appointments.   Follow-Up Appointments: Follow-up Information    Forrest Moron, MD. Go on 01/07/2017.   Specialty:  Internal Medicine Why:  @ 11am. Please arrive 15 min early. If you are unable to make this appointment you must call 24 hours in advance to re-schedule.  Contact information: [place] [address] [phone]        Dennie Bible, NP. Schedule an appointment as soon  as possible for a visit in 6  week(s).   Specialty:  Family Medicine Contact information: [address] [place] [address] [place], [place], [place] 01/05/2017, 2:09 PM PGY-1, [place]

## 2017-01-04 NOTE — Discharge Instructions (Signed)
You were admitted for a TIA and were cleared by neurology for discharge. You should continue atorvastatin 20m daily, aspirin 3277mand clopidogrel 7530maily. You will be getting home health physical therapy   Please follow up with your PCP on 01/07/17 @ 11am. If this time does not work for you you must call at least 24 hours in advance to re-schedule.

## 2017-01-04 NOTE — Care Management Note (Signed)
Case Management Note  Patient Details  Name: [name] MRN: [id number] Date of Birth: [birth year]  Subjective/Objective:   Pt in with TIA. She is from home with her daughter.                  Action/Plan: Pt discharging home with orders for [facility] services and DME. CM met with the patient and her daughter and provided choice of [place] agencies. They selected [facility]. Cory with [facility] notified and accepted the referral. Pt with orders for a rolling walker. Dan with [facility] notified and will deliver the equipment to the room. Daughter to provide supervision at home and transportation to home.   Expected Discharge Date:                  Expected Discharge Plan:  [place]  In-House Referral:     Discharge planning Services  CM Consult  Post Acute Care Choice:  Durable Medical Equipment, Home Health Choice offered to:     DME Arranged:  Walker rolling DME Agency:  [place] Arranged:  RN, PT [facility] Agency:  [facility]  Status of Service:  Completed, signed off  If discussed at [place] of Stay Meetings, dates discussed:    Additional Comments:  Pollie Friar, RN 01/04/2017, 11:59 AM

## 2017-01-04 NOTE — Progress Notes (Signed)
STROKE TEAM PROGRESS NOTE   SUBJECTIVE (INTERVAL HISTORY) The patient continues to feel she is improving. CTA Head & Neck completed. No acute findings. No new/acute findings overnight. Patient voices no new concerns  OBJECTIVE Temp:  [97.8 F (36.6 C)-98.4 F (36.9 C)] 98 F (36.7 C) (12/03 0922) Pulse Rate:  [66-86] 73 (12/03 0922) Cardiac Rhythm: Bundle branch block;Heart block (12/03 0740) Resp:  [18-20] 20 (12/03 0922) BP: (132-161)/(54-85) 140/68 (12/03 0922) SpO2:  [96 %-99 %] 99 % (12/03 0922)  CBC:  Recent Labs  Lab 01/01/17 2205 01/02/17 1204  WBC 3.4* 4.2  NEUTROABS 2.1  --   HGB 11.8* 13.1  HCT 35.8* 39.1  MCV 81.9 81.0  PLT 125* 144*    Basic Metabolic Panel:  Recent Labs  Lab 01/03/17 0258 01/04/17 0455  NA 136 137  K 3.5 3.4*  CL 101 105  CO2 25 26  GLUCOSE 237* 219*  BUN 18 15  CREATININE 1.34* 1.16*  CALCIUM 8.6* 8.3*   Lipid Panel:     Component Value Date/Time   CHOL 245 (H) 01/03/2017 0258   TRIG 603 (H) 01/03/2017 0258   HDL 21 (L) 01/03/2017 0258   CHOLHDL 11.7 01/03/2017 0258   VLDL UNABLE TO CALCULATE IF TRIGLYCERIDE OVER 400 mg/dL 01/03/2017 0258   LDLCALC UNABLE TO CALCULATE IF TRIGLYCERIDE OVER 400 mg/dL 01/03/2017 0258   HgbA1c:  Lab Results  Component Value Date   HGBA1C 12.4 (H) 01/02/2017   IMAGING Ct Head Wo Contrast 01/01/2017 IMPRESSION:  No CT evidence for acute intracranial abnormality.   CTA Head & Neck - 01/03/2017 IMPRESSION: CT HEAD: 1. No acute intracranial process. 2. Mild to moderate atherosclerosis, otherwise negative CT HEAD with and without contrast for age. CTA NECK: 1. No hemodynamically significant stenosis or acute vascular process. 2. **An incidental finding of potential clinical significance has been found. 3.5 cm ascending aorta. Recommend annual imaging followup by CTA or MRA. This recommendation follows 2010 ACCF/AHA/AATS/ACR/ASA/SCA/SCAI/SIR/STS/SVM Guidelines for the Diagnosis and  Management of Patients with Thoracic Aortic Disease. Circulation.2010; 121: M010-U725** CTA HEAD: 1. No emergent large vessel occlusion. 2. 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 3. Mild atherosclerosis cerebral artery's.  Transthoracic Echocardiogram 09/21/2016 Study Conclusions - Procedure narrative: Transthoracic echocardiography. Image   quality was poor. The study was technically difficult, as a   result of poor sound wave transmission and body habitus. - Left ventricle: The cavity size was normal. There was mild focal   basal hypertrophy of the septum. Systolic function was normal.   The estimated ejection fraction was in the range of 60% to 65%.   Wall motion was normal; there were no regional wall motion   abnormalities. Doppler parameters are consistent with abnormal   left ventricular relaxation (grade 1 diastolic dysfunction). - Ascending aorta: The ascending aorta was mildly dilated.  Impressions: - Normal LV systolic function; mild diastolic dysfunction;   sclerotic aortic valve; mildly dilated ascending aorta.   PHYSICAL EXAM Vitals:   01/03/17 2149 01/04/17 0127 01/04/17 0506 01/04/17 0922  BP: (!) 132/54 (!) 161/77 (!) 150/81 140/68  Pulse: 66 72 72 73  Resp: 20 20 18 20   Temp: 97.8 F (36.6 C) 98.2 F (36.8 C) 98.3 F (36.8 C) 98 F (36.7 C)  TempSrc: Oral Oral Oral Oral  SpO2: 99% 97% 98% 99%  Weight:      Height:      Pleasant middle-age Caucasian lady currently not in distress. . Afebrile. Head is nontraumatic. Neck is supple without  bruit.    Cardiac exam no murmur or gallop. Lungs are clear to auscultation. Distal pulses are well felt. Neurological Exam ;  Awake  Alert oriented x 3. Normal speech and language.eye movements full without nystagmus.fundi were not visualized. Vision acuity and fields appear normal. Hearing is normal. Palatal movements are normal. Face symmetric. Tongue midline. Normal strength, tone, reflexes and  coordination except for mild diminished fine finger movements on the left and mild left grip and hip is a weakness which are old as per the patient from previous stroke. Normal sensation. Gait deferred.   ASSESSMENT/PLAN Ms. Stamatia Boyington is a 61 y.o. female with history of a previous stroke, obstructive sleep apnea, liver disease, hypertension, history of migraine headaches, diabetes mellitus, coronary artery disease, and chronic kidney disease presenting with left-sided weakness and headache with photophobia. She did not receive IV t-PA due to late presentation.  TIA versus complicated migraine  Resultant  No new deficits. Mild left sided weakness from prior old stroke- resolved  CT head - No CT evidence for acute intracranial abnormality.   MRI head - spinal stimulator  MRA head - spinal stimulator  CTA head and neck unremarkable  2D Echo - EF 60-65%. No cardiac source of emboli identified.  LDL - unable to calculate due to TG 603  HgbA1c - 12.4  VTE prophylaxis - subcutaneous heparin Diet Carb Modified Fluid consistency: Thin; Room service appropriate? Yes  No antithrombotic prior to admission, now on clopidogrel 75 mg daily.  Continue Plavix on discharge  Patient counseled to be compliant with her antithrombotic medications  Ongoing aggressive stroke risk factor management  Therapy recommendations:  HOME  Disposition:  HOME  Hypertension  Stable  Permissive hypertension (OK if < 220/120) but gradually normalize in 5-7 days  Long-term BP goal normotensive  Hyperlipidemia  Home meds: No lipid lowering medications prior to admission  LDL unable to calculate, goal < 70 (history of liver disease)  On Lipitor and Zetia  Continue on discharge  Diabetes  HgbA1c 12.4, goal < 7.0  Uncontrolled  Close PCP follow-up and DM education  On Lantus  SSI  CBG monitoring  Other Stroke Risk Factors  Advanced age  Obesity, Body mass index is 36.8 kg/m.,  recommend weight loss, diet and exercise as appropriate   Hx stroke/TIA with residual left-sided weakness  Family hx stroke (father and grandfather)  Coronary artery disease  Migraines  Obstructive sleep apnea  Other Active Problems  Mild thrombocytopenia  Mild hypokalemia  Chronic kidney disease stage III  Spinal stimulator for history of low back pain (currently turned off)  Hospital day # 2  Renie Ora Neurology Stroke Team 01/04/2017 11:56 AM   I reviewed above note and agree with the assessment and plan. I have made any additions or clarifications directly to the above note.    Neurology will sign off. Please call with questions. Pt will follow up with Cecille Rubin, NP, at [facility] in about 6 weeks. Thanks for the consult.   Rosalin Hawking, MD PhD Stroke Neurology 01/04/2017 10:23 PM           To contact Stroke Continuity provider, please refer to [email or web address] After hours, contact General Neurology

## 2017-01-06 ENCOUNTER — Telehealth (INDEPENDENT_AMBULATORY_CARE_PROVIDER_SITE_OTHER): Payer: Self-pay | Admitting: Orthopedic Surgery

## 2017-01-06 NOTE — Telephone Encounter (Signed)
Will call back with referral number tomorrow [phone]

## 2017-01-06 NOTE — Telephone Encounter (Signed)
Merrilee Seashore called from [employer] and needs some orders clarified for this patient. Please give a call back [phone]

## 2017-01-06 NOTE — Telephone Encounter (Signed)
Advised over the phone clarification of orders we will refax to [facility], referral number was cutoff form.

## 2017-01-07 ENCOUNTER — Encounter: Payer: Self-pay | Admitting: Family Medicine

## 2017-01-07 ENCOUNTER — Other Ambulatory Visit: Payer: Self-pay

## 2017-01-07 ENCOUNTER — Ambulatory Visit (INDEPENDENT_AMBULATORY_CARE_PROVIDER_SITE_OTHER): Payer: Medicare HMO | Admitting: Family Medicine

## 2017-01-07 VITALS — BP 132/70 | HR 89 | Temp 98.1°F | Resp 18 | Ht 66.0 in | Wt 231.2 lb

## 2017-01-07 DIAGNOSIS — I10 Essential (primary) hypertension: Secondary | ICD-10-CM | POA: Diagnosis not present

## 2017-01-07 DIAGNOSIS — G894 Chronic pain syndrome: Secondary | ICD-10-CM | POA: Diagnosis not present

## 2017-01-07 DIAGNOSIS — G459 Transient cerebral ischemic attack, unspecified: Secondary | ICD-10-CM | POA: Diagnosis not present

## 2017-01-07 DIAGNOSIS — E1165 Type 2 diabetes mellitus with hyperglycemia: Secondary | ICD-10-CM

## 2017-01-07 DIAGNOSIS — Z9181 History of falling: Secondary | ICD-10-CM | POA: Diagnosis not present

## 2017-01-07 DIAGNOSIS — I700 Atherosclerosis of aorta: Secondary | ICD-10-CM

## 2017-01-07 NOTE — Telephone Encounter (Signed)
I called and gave referral number over the phone.

## 2017-01-07 NOTE — Patient Instructions (Addendum)
Your sugars are out of range and there are concerns about your having a stroke or heart attack  [facility] Address: [address] [place], [place] [address]  Phone: [phone]   Sliding Scale Instructions Test your sugars   150-200        2 201-250        4 251-300        6 301-350        8 351-400        10 >400             12   If this happens more than twice please call the clinic for an appointment.   IF you received an x-ray today, you will receive an invoice from [facility]. Please contact [facility] at [phone] with questions or concerns regarding your invoice.   IF you received labwork today, you will receive an invoice from [facility]. Please contact [facility] at [phone] with questions or concerns regarding your invoice.   Our billing staff will not be able to assist you with questions regarding bills from these companies.  You will be contacted with the lab results as soon as they are available. The fastest way to get your results is to activate your My Chart account. Instructions are located on the last page of this paperwork. If you have not heard from [place] regarding the results in 2 weeks, please contact this office.     Correction Insulin Correction insulin, also called corrective insulin or a supplemental dose, is a small amount of insulin that can be used to lower your blood sugar (glucose) if it is too high. You may be instructed to check your blood glucose at certain times of the day and to use correction insulin as needed to lower your blood glucose to your target range. Correction insulin is primarily used as part of diabetes management. It may also be prescribed for people who do not have diabetes. What is a correction scale? A correction scale, also called a sliding scale, is prescribed by your health care provider to help you determine when you need correction insulin. Your correction scale is based on your individual  treatment goals, and it has two parts:  Ranges of blood glucose levels.  How much correction insulin to give yourself if your blood sugar falls within a certain range.  If your blood glucose is in your desired range, you will not need correction insulin and you should take your normal insulin dose. What type of insulin do I need? Your health care provider may prescribe rapid-acting or short-acting insulin for you to use as correction insulin. Rapid-acting insulin:  Starts working quickly, in as little as 5 minutes.  Can last for 3-6 hours.  Works well when taken right before a meal to quickly lower blood glucose. Short-acting insulin:  Starts working in about 30 minutes.  Can last for 6-8 hours.  Should be taken about 30 minutes before you start eating a meal. Talk with your health care provider or pharmacist about which type of correction insulin to take and when to take it. If you use insulin to control your diabetes, you should use correction insulin in addition to the longer-acting (basal) insulin that you normally use. How do I manage my blood glucose with correction insulin? Giving a correction dose  Check your blood glucose as directed by your health care provider.  Use your correction scale to find the range that your blood glucose is in.  Identify the units of insulin that match your blood glucose range.  Make sure you have food available that you can eat in the next 15-30 minutes, after your correction dose.  Give yourself the dose of correction insulin that your health care provider has prescribed in your correction scale. Always make sure you are using the right type of insulin. ? If your correction insulin is rapid-acting, start eating a meal within 15 minutes after your correction dose to keep your blood glucose from getting too low. ? If your correction insulin is short-acting, start eating a meal within 30 minutes after your correction dose to keep your blood  glucose from getting too low. Keeping a blood glucose log  Write down your blood glucose test results and the amount of insulin that you give yourself. Do this every time you check blood glucose or take insulin. Bring this log with you to your medical visits. This information will help your health care provider to manage your medicines.  Note anything that may affect your blood glucose, such as: ? Changes in normal exercise or activity. ? Changes in your normal schedule, such as changes in your sleep routine, going on vacation, changing your diet, or holidays. ? New over-the-counter or prescription medicines. ? Illness, stress, or anxiety. ? Changes in the time that you took your medicine or insulin. ? Changes in your meals, such as skipping a meal, having a late meal, or dining out. ? Eating things that may affect blood glucose, such as snacks, meal portions that are larger than normal, drinks that contain sugar, or eating less than usual. What do I need to know about hyperglycemia and hypoglycemia? What is hyperglycemia? Hyperglycemia, also called high blood glucose, occurs when blood glucose is too high. Make sure you know the early signs of hyperglycemia, such as:  Increased thirst.  Hunger.  Feeling very tired.  Needing to urinate more often than usual.  Blurry vision.  What is hypoglycemia? Hypoglycemia is also called low blood glucose. Be aware of "stacking" your insulin doses. This happens when you correct a high blood glucose by giving yourself extra insulin too soon after a previous correction dose or mealtime dose. This may cause you to have too much insulin in your body and may put you at risk for hypoglycemia. Hypoglycemia occurs with a blood glucose level at or below 70 mg/dL (3.9 mmol/L). It is important to know the symptoms of hypoglycemia and treat it right away. Always have a 15-gram rapid-acting carbohydrate snack with you to treat low blood glucose. Family members and  close friends should also know the symptoms and should understand how to treat hypoglycemia, in case you are not able to treat yourself. What are the symptoms of hypoglycemia? Hypoglycemia symptoms can include:  Hunger.  Anxiety.  Sweating and feeling clammy.  Confusion.  Dizziness or light-headedness.  Sleepiness.  Nausea.  Increased heart rate.  Headache.  Blurry vision.  Jerky movements that you cannot control (seizure).  Nightmares.  Tingling or numbness around the mouth, lips, or tongue.  A change in speech.  Decreased ability to concentrate.  A change in coordination.  Restless sleep.  Tremors or shakes.  Fainting.  Irritability.  How do I treat hypoglycemia? If you are alert and able to swallow safely, follow the 15:15 rule:  Take 15 grams of a rapid-acting carbohydrate. Rapid-acting options include: ? 1 tube of glucose gel. ? 3 glucose pills. ? 6-8 pieces of hard candy. ? 4 oz (120 mL) of  fruit juice. ? 4 oz (120 mL) of regular (not diet) soda.  Check your blood glucose 15 minutes after you take the carbohydrate. ? If the repeat blood glucose level is still at or below 70 mg/dL (3.9 mmol/L), take 15 grams of a carbohydrate again. ? If your blood glucose level does not increase above 70 mg/dL (3.9 mmol/L) after 3 tries, seek emergency medical care.  After your blood glucose level returns to normal, eat a meal or a snack within 1 hour.  How do I treat severe hypoglycemia? Severe hypoglycemia is when your blood glucose level is at or below 54 mg/dL (3 mmol/L). Severe hypoglycemia is an emergency. Do not wait to see if the symptoms will go away. Get medical help right away. Call your local emergency services (911 in the [place] Do not drive yourself to the hospital. If you have severe hypoglycemia and you cannot eat or drink, you may need an injection of glucagon. A family member or close friend should learn how to check your blood glucose and how to  give you a glucagon injection. Ask your health care provider if you need to have an emergency glucagon injection kit available. Severe hypoglycemia may need to be treated in a hospital. The treatment may include getting glucose through an IV tube. You may also need treatment for the cause of your hypoglycemia. Why do I need correction insulin if I do not have diabetes? If you do not have diabetes, your health care provider may prescribe insulin because:  Keeping your blood glucose in the target range is important for your overall health.  You are taking medicines that cause your blood glucose to be higher than normal.  Contact a health care provider if:  You develop low blood glucose that you are not able to treat yourself.  You have high blood glucose that you are not able to correct with correction insulin.  Your blood glucose is often too low.  You used emergency glucagon to treat low blood glucose. Get help right away if:  You become unresponsive. If this happens, someone else should call emergency services (911 in the [place] right away.  Your blood glucose is lower than 54 mg/dL (3.0 mmol/L).  You become confused or you have trouble thinking clearly.  You have difficulty breathing. Summary  Correction insulin is a small amount of insulin that can be used to lower your blood sugar (glucose) if it is too high.  Talk with your health care provider or pharmacist about which type of correction insulin to take and when to take it. If you use insulin to control your diabetes, you should use correction insulin in addition to the longer-acting (basal) insulin that you normally use.  You may be instructed to check your blood glucose at certain times of the day and to use correction insulin as needed to lower your blood glucose to your target range. Always keep a log of your blood glucose values and the amount of insulin that you used.  It is important to know the symptoms of hypoglycemia  and treat it right away. Always have a 15-gram rapid-acting carbohydrate snack with you to treat low blood glucose. Family members and close friends should also know the symptoms and should understand how to treat hypoglycemia, in case you are not able to treat yourself. This information is not intended to replace advice given to you by your health care provider. Make sure you discuss any questions you have with your health care provider.  Document Released: 06/12/2010 Document Revised: 10/18/2015 Document Reviewed: 10/18/2015 Elsevier Interactive Patient Education  [employer].

## 2017-01-07 NOTE — Progress Notes (Signed)
Chief Complaint  Patient presents with  . Follow-up    post hospital    HPI   Pt now has home health coming to the house for diabetes management She reports that she got her insulin from the pharmacy This morning her blood glucose fasting was 382 after eating ravioli with ricotta  She states that she took morning insulin 266 postprandial  She reports that she is trying to work on her eating plan She eats tv dinners and biscuits and gravy sometimes She eats a lot of starches but has cut down on diet sodas She does not exercise in anyway. Not even upper body movement.  She is taking gabapentin, buspar and nucynta,  She continues to have migraines despite the headaches She was told to avoid nsaids due to her kidneys and heart She did not know if there was anything else she could take for pain and wanted to try an experimental headache medicine   Pt reports that she has high bp readings of 170/80s She is compliant with her meds but does not understand what to eat sometimes She ate a tv dinner that was very salty She reports that she is trying to avoid the boxed dinners She states that her family eat spanish food with refried beans and such and this makes her bp high  Past Medical History:  Diagnosis Date  . Allergy   . Arthritis   . Chronic kidney disease   . Chronic pain   . Coronary artery disease   . Diabetes mellitus without complication ([place])   . Dyspnea   . GERD (gastroesophageal reflux disease)   . Heart murmur   . Hypertension   . Liver disease   . NASH (nonalcoholic steatohepatitis) 09/16/2016  . Obstructive sleep apnea   . Osteomyelitis ([place])   . Peripheral vascular disease ([place])   . Sleep apnea   . Stroke ([place])   . Ulcers of both great toes ([place]) 11/07/2016   right great toe, started as finger nail size and grew in 6 days     Current Outpatient Medications  Medication Sig Dispense Refill  . atorvastatin (LIPITOR) 80 MG tablet Take 1 tablet (80 mg total)  by mouth daily at 6 PM. 30 tablet 0  . busPIRone (BUSPAR) 15 MG tablet Take 1 tablet (15 mg total) by mouth 3 (three) times daily. 90 tablet 1  . carvedilol (COREG) 25 MG tablet Take 1 tablet (25 mg total) by mouth 2 (two) times daily. 180 tablet 1  . clopidogrel (PLAVIX) 75 MG tablet Take 1 tablet (75 mg total) by mouth daily. 30 tablet 0  . exenatide (BYETTA 5 MCG PEN) 5 MCG/0.02ML SOPN injection Inject 0.02 mLs (5 mcg total) into the skin 2 (two) times daily with a meal. 1.2 mL 3  . ezetimibe (ZETIA) 10 MG tablet Take 1 tablet (10 mg total) by mouth daily. 30 tablet 0  . gabapentin (NEURONTIN) 300 MG capsule Take 1 capsule (300 mg total) by mouth 3 (three) times daily. 90 capsule 3  . hydrochlorothiazide (HYDRODIURIL) 25 MG tablet Take 1 tablet (25 mg total) by mouth daily. 90 tablet 1  . insulin aspart (NOVOLOG) 100 UNIT/ML FlexPen Inject 10 Units into the skin 3 (three) times daily before meals. "PER A SLIDING SCALE OF AN ADDITIONAL 2-10 UNITS" 15 mL 3  . LANTUS SOLOSTAR 100 UNIT/ML Solostar Pen Inject 60 Units into the skin 2 (two) times daily. MORNING and BEDTIME  1  . sertraline (ZOLOFT) 50 MG tablet  Take 1 tablet (50 mg total) by mouth daily. 90 tablet 1  . tapentadol (NUCYNTA) 50 MG tablet Take 1 tablet (50 mg total) by mouth 3 (three) times daily. 90 tablet 0  . tiZANidine (ZANAFLEX) 4 MG tablet Take 4 mg by mouth 3 (three) times daily.     No current facility-administered medications for this visit.     Allergies:  Allergies  Allergen Reactions  . Lasix [Furosemide]     Dehydration leading to kidney failure   . Norvasc [Amlodipine Besylate]     Severe edema  . Prednisone Other (See Comments)    Ketoacidosis  . Vancomycin Other (See Comments)    Total organ shut down  . Doxycycline Hives  . Ibuprofen Other (See Comments)    WAS TOLD TO NOT TAKE THIS BECAUSE OF HER KIDNEYS AND LIVER  . Inderal [Propranolol] Cough  . Lisinopril Other (See Comments)    WAS TOLD TO NOT TAKE  THIS BECAUSE OF HER KIDNEYS AND LIVER  . Lovastatin Other (See Comments)    WAS TOLD TO NOT TAKE THIS BECAUSE OF HER KIDNEYS AND LIVER  . Norvasc [Amlodipine Besylate] Other (See Comments)    Severe edema  . Nsaids Other (See Comments)    WAS TOLD TO NOT TAKE THIS BECAUSE OF HER KIDNEYS AND LIVER  . Sulfa Antibiotics Nausea Only    Past Surgical History:  Procedure Laterality Date  . ABDOMINAL HYSTERECTOMY    . BACK SURGERY    . CESAREAN SECTION    . CHOLECYSTECTOMY    . HERNIA REPAIR    . JOINT REPLACEMENT    . KNEE SURGERY    . SPINE SURGERY    . TOE AMPUTATION    . TOE AMPUTATION Bilateral    3 on right foot 2 left foot   . TONSILLECTOMY    . TUBAL LIGATION      Social History   Socioeconomic History  . Marital status: Divorced    Spouse name: None  . Number of children: None  . Years of education: None  . Highest education level: None  Social Needs  . Financial resource strain: None  . Food insecurity - worry: None  . Food insecurity - inability: None  . Transportation needs - medical: None  . Transportation needs - non-medical: None  Occupational History  . None  Tobacco Use  . Smoking status: Never Smoker  . Smokeless tobacco: Never Used  Substance and Sexual Activity  . Alcohol use: No  . Drug use: No  . Sexual activity: No  Other Topics Concern  . None  Social History Narrative   ** Merged History Encounter **        Family History  Problem Relation Age of Onset  . Heart disease Father   . Stroke Maternal Grandfather   . Heart disease Paternal Grandfather   . Cancer Mother        Thymoma, metastatic  . Stroke Father   . Multiple sclerosis Sister   . Epilepsy Sister      ROS Review of Systems See HPI Constitution: No fevers or chills No malaise No diaphoresis Skin: No rash or itching Eyes: no blurry vision, no double vision GU: no dysuria or hematuria Neuro: no dizziness or headaches all others reviewed and negative    Objective: Vitals:   01/07/17 1132  BP: 132/70  Pulse: 89  Resp: 18  Temp: 98.1 F (36.7 C)  TempSrc: Oral  SpO2: 98%  Weight: 231 lb 3.2  oz (104.9 kg)  Height: 5' 6"  (1.676 m)    Physical Exam  Constitutional: She is oriented to person, place, and time. She appears well-developed and well-nourished.  obese  HENT:  Head: Normocephalic and atraumatic.  Eyes: Conjunctivae and EOM are normal.  Cardiovascular: Normal rate, regular rhythm and normal heart sounds.  No murmur heard. Pulmonary/Chest: Effort normal and breath sounds normal. No stridor. No respiratory distress.  Abdominal: Soft. Bowel sounds are normal. She exhibits no distension. There is no tenderness. There is no guarding.  Obese abdomen  Neurological: She is alert and oriented to person, place, and time.    Assessment and Plan Ginamarie was seen today for follow-up.  Diagnoses and all orders for this visit:  Uncontrolled type 2 diabetes mellitus with hyperglycemia ([place]) -     Ambulatory referral to diabetic education  Chronic pain syndrome  Aortic atherosclerosis (HCC)  At high risk for falls  Essential hypertension  TIA (transient ischemic attack)    Problem List Items Addressed This Visit      Cardiovascular and Mediastinum   Essential hypertension    bp with labile ranges. Pt not understanding how to read food labels. Provided education on sodium content in food      TIA (transient ischemic attack)    Discussed risk factor modification and diabetes control      Aortic atherosclerosis ([place])    Discussed the reason she has atherosclerosis so she should continue zetia and plavix as well as cardiology recommendation        Endocrine   Uncontrolled type 2 diabetes mellitus with hyperglycemia ([place]) - Primary    Review of chart shows that patient was contacted by Endocrinology and they did not establish care.  They were given the number again to make the appoint to follow up as she is close to  max doses of insulin and is still very uncontrolled      Relevant Orders   Ambulatory referral to diabetic education     Other   Chronic pain syndrome    Continue current pain plan and remain with pain mgmt. Awaiting January appt to establish care.       At high risk for falls    Continue wearing appropriate shoes.  Fall precautions athome.  Home health PT discussed          A total of 50 minutes were spent face-to-face with the patient during this encounter and over half of that time was spent on counseling and coordination of care.  [place]

## 2017-01-11 ENCOUNTER — Ambulatory Visit (INDEPENDENT_AMBULATORY_CARE_PROVIDER_SITE_OTHER): Payer: Medicare PPO | Admitting: Orthopedic Surgery

## 2017-01-13 ENCOUNTER — Ambulatory Visit (INDEPENDENT_AMBULATORY_CARE_PROVIDER_SITE_OTHER): Payer: Medicare HMO | Admitting: Family

## 2017-01-13 ENCOUNTER — Encounter (INDEPENDENT_AMBULATORY_CARE_PROVIDER_SITE_OTHER): Payer: Self-pay | Admitting: Orthopedic Surgery

## 2017-01-13 VITALS — Ht 66.0 in | Wt 231.0 lb

## 2017-01-13 DIAGNOSIS — E08621 Diabetes mellitus due to underlying condition with foot ulcer: Secondary | ICD-10-CM

## 2017-01-13 DIAGNOSIS — L97411 Non-pressure chronic ulcer of right heel and midfoot limited to breakdown of skin: Secondary | ICD-10-CM

## 2017-01-13 DIAGNOSIS — I700 Atherosclerosis of aorta: Secondary | ICD-10-CM | POA: Insufficient documentation

## 2017-01-13 DIAGNOSIS — L97511 Non-pressure chronic ulcer of other part of right foot limited to breakdown of skin: Secondary | ICD-10-CM | POA: Diagnosis not present

## 2017-01-13 DIAGNOSIS — Z9181 History of falling: Secondary | ICD-10-CM | POA: Insufficient documentation

## 2017-01-13 MED ORDER — AMOXICILLIN-POT CLAVULANATE 875-125 MG PO TABS: 1.0000 | ORAL_TABLET | Freq: Two times a day (BID) | ORAL | 0 refills | Status: DC

## 2017-01-13 NOTE — Assessment & Plan Note (Signed)
Review of chart shows that patient was contacted by Endocrinology and they did not establish care.  They were given the number again to make the appoint to follow up as she is close to max doses of insulin and is still very uncontrolled

## 2017-01-13 NOTE — Assessment & Plan Note (Signed)
Continue wearing appropriate shoes.  Fall precautions athome.  Home health PT discussed

## 2017-01-13 NOTE — Assessment & Plan Note (Signed)
Continue current pain plan and remain with pain mgmt. Awaiting January appt to establish care.

## 2017-01-13 NOTE — Assessment & Plan Note (Signed)
Discussed the reason she has atherosclerosis so she should continue zetia and plavix as well as cardiology recommendation

## 2017-01-13 NOTE — Assessment & Plan Note (Signed)
Discussed risk factor modification and diabetes control

## 2017-01-13 NOTE — Assessment & Plan Note (Signed)
bp with labile ranges. Pt not understanding how to read food labels. Provided education on sodium content in food

## 2017-01-14 ENCOUNTER — Encounter (INDEPENDENT_AMBULATORY_CARE_PROVIDER_SITE_OTHER): Payer: Self-pay | Admitting: Family

## 2017-01-14 NOTE — Progress Notes (Signed)
Office Visit Note   Patient: [name]           Date of Birth: Jan 04, 1956           MRN: [id number] Visit Date: 01/13/2017              Requested by: Forrest Moron, MD [place], [place] [id number] PCP: Forrest Moron, MD  Chief Complaint  Patient presents with  . Right Foot - Follow-up, Wound Check      HPI: Patient is a 61 year old woman who is seen in follow up for evaluation for ulceration beneath the first metatarsal head right foot.  Patient has had a history of osteomyelitis of the second third and fourth toes and has had these amputated.  She is currently using Promogran dressing changes she is in a Darco shoe.    Ulceration to 5th toe is healed.     Assesment & Plan: Visit Diagnoses:  1. Diabetic ulcer of right heel associated with diabetes mellitus due to underlying condition, limited to breakdown of skin ([place])   2. Non-pressure chronic ulcer of other part of right foot limited to breakdown of skin ([place])     Plan: Continue the felt pressure relieving donut beneath the first metatarsal head and the Darco shoe for the right foot.  She will use soap and water cleansing and dry dressing change daily with the dressing materials provided today. [facility] to contact patient and provide supplies. Discussed importance of nonweightbearing and that she should use the shoe for only minimum activities primarily she should ambulate in a wheelchair.  Follow-Up Instructions: Return in about 3 weeks (around 02/03/2017).   Ortho Exam  Patient is alert, oriented, no adenopathy, well-dressed, normal affect, normal respiratory effort. Examination patient has a palpable pulse she has good dorsiflexion of the ankle she has a Wagner grade 1 ulcer beneath the first metatarsal head of the right foot.  After informed consent a 10 blade knife was used to debride the skin and soft tissue back to bleeding viable granulation tissue this was touched with silver nitrate for hemostasis a  Band-Aid was applied.  The wound has 100% beefy granulation tissue the ulcer is 15 x 5 mm and 2 mm deep. Slight surrounding erythema. No ascending cellulitis. No surrounding maceration. No odor. No drainage. Ulceration to medial aspect of 5th toe is well healed. No erythema or open area.   Imaging: No results found. No images are attached to the encounter.  Labs: Lab Results  Component Value Date   HGBA1C 12.4 (H) 01/02/2017   HGBA1C 12.7 11/23/2016   HGBA1C 12.2 (H) 09/21/2016   ESRSEDRATE 39 (H) 12/05/2016   CRP 3.7 (H) 12/05/2016   REPTSTATUS 10/31/2016 FINAL 10/28/2016   CULT >=100,000 COLONIES/mL ESCHERICHIA COLI (A) 10/28/2016   LABORGA ESCHERICHIA COLI (A) 10/28/2016    Orders:  No orders of the defined types were placed in this encounter.  Meds ordered this encounter  Medications  . amoxicillin-clavulanate (AUGMENTIN) 875-125 MG tablet    Sig: Take 1 tablet by mouth 2 (two) times daily.    Dispense:  20 tablet    Refill:  0     Procedures: No procedures performed  Clinical Data: No additional findings.  ROS:  All other systems negative, except as noted in the HPI. Review of Systems  Constitutional: Negative for chills and fever.  Skin: Positive for wound. Negative for color change.    Objective: Vital Signs: Ht 5' 6"  (1.676 m)  Wt 231 lb (104.8 kg)   BMI 37.28 kg/m   Specialty Comments:  No specialty comments available.  PMFS History: Patient Active Problem List   Diagnosis Date Noted  . Aortic atherosclerosis ([place]) 01/13/2017  . At high risk for falls 01/13/2017  . Uncontrolled type 2 diabetes mellitus with hyperglycemia ([place]) 01/13/2017  . Stroke-like symptoms   . Complicated migraine   . Mixed hyperlipidemia   . Generalized weakness   . TIA (transient ischemic attack) 01/02/2017  . Ulcer of toe of right foot, limited to breakdown of skin ([place]) 12/18/2016  . Non-pressure chronic ulcer of other part of right foot limited to breakdown of skin  ([place]) 12/07/2016  . Diabetic foot ulcer ([place]) 12/04/2016  . Hypovolemia 12/04/2016  . Chronic pain syndrome 12/04/2016  . Diabetic peripheral neuropathy ([place]) 12/04/2016  . Hypotension   . Liver mass 09/20/2016  . Kidney disease, chronic, stage III (GFR 30-59 ml/min) (HCC) 12/17/2015  . Diabetes mellitus ([place]) 07/18/2015  . Essential hypertension 06/06/2015  . Chronic back pain 07/27/2012  . OSA on CPAP 03/21/2012   Past Medical History:  Diagnosis Date  . Allergy   . Arthritis   . Chronic kidney disease   . Chronic pain   . Coronary artery disease   . Diabetes mellitus without complication ([place])   . Dyspnea   . GERD (gastroesophageal reflux disease)   . Heart murmur   . Hypertension   . Liver disease   . NASH (nonalcoholic steatohepatitis) 09/16/2016  . Obstructive sleep apnea   . Osteomyelitis ([place])   . Peripheral vascular disease ([place])   . Sleep apnea   . Stroke ([place])   . Ulcers of both great toes ([place]) 11/07/2016   right great toe, started as finger nail size and grew in 6 days     Family History  Problem Relation Age of Onset  . Heart disease Father   . Stroke Maternal Grandfather   . Heart disease Paternal Grandfather   . Cancer Mother        Thymoma, metastatic  . Stroke Father   . Multiple sclerosis Sister   . Epilepsy Sister     Past Surgical History:  Procedure Laterality Date  . ABDOMINAL HYSTERECTOMY    . BACK SURGERY    . CESAREAN SECTION    . CHOLECYSTECTOMY    . HERNIA REPAIR    . JOINT REPLACEMENT    . KNEE SURGERY    . SPINE SURGERY    . TOE AMPUTATION    . TOE AMPUTATION Bilateral    3 on right foot 2 left foot   . TONSILLECTOMY    . TUBAL LIGATION     Social History   Occupational History  . Not on file  Tobacco Use  . Smoking status: Never Smoker  . Smokeless tobacco: Never Used  Substance and Sexual Activity  . Alcohol use: No  . Drug use: No  . Sexual activity: No

## 2017-01-15 ENCOUNTER — Telehealth (INDEPENDENT_AMBULATORY_CARE_PROVIDER_SITE_OTHER): Payer: Self-pay | Admitting: Family

## 2017-01-15 NOTE — Telephone Encounter (Signed)
No wound care orders were given for this patient, can you give Buzz a call back if there is anything they should be doing? His CB# [phone]

## 2017-01-15 NOTE — Telephone Encounter (Signed)
I called and spoke with [name] to advise promogran dressing with dry dressing. They have supplies they are giving to patient. They will provide patient supplies instead of [facility].

## 2017-01-21 ENCOUNTER — Ambulatory Visit: Payer: Medicare HMO | Admitting: Family Medicine

## 2017-01-25 ENCOUNTER — Other Ambulatory Visit: Payer: Self-pay

## 2017-01-25 ENCOUNTER — Ambulatory Visit: Payer: Medicare HMO | Admitting: Family Medicine

## 2017-01-25 ENCOUNTER — Ambulatory Visit (INDEPENDENT_AMBULATORY_CARE_PROVIDER_SITE_OTHER): Payer: Medicare HMO

## 2017-01-25 ENCOUNTER — Encounter: Payer: Self-pay | Admitting: Family Medicine

## 2017-01-25 VITALS — BP 108/58 | HR 90 | Temp 98.0°F | Resp 16 | Ht 66.54 in | Wt 227.0 lb

## 2017-01-25 DIAGNOSIS — E1165 Type 2 diabetes mellitus with hyperglycemia: Secondary | ICD-10-CM

## 2017-01-25 DIAGNOSIS — E08621 Diabetes mellitus due to underlying condition with foot ulcer: Secondary | ICD-10-CM

## 2017-01-25 DIAGNOSIS — L97411 Non-pressure chronic ulcer of right heel and midfoot limited to breakdown of skin: Secondary | ICD-10-CM

## 2017-01-25 DIAGNOSIS — R1084 Generalized abdominal pain: Secondary | ICD-10-CM | POA: Diagnosis not present

## 2017-01-25 DIAGNOSIS — K439 Ventral hernia without obstruction or gangrene: Secondary | ICD-10-CM

## 2017-01-25 DIAGNOSIS — R140 Abdominal distension (gaseous): Secondary | ICD-10-CM

## 2017-01-25 LAB — POCT CBC
Lymph, poc: 1.2 (ref 0.6–3.4)
MID (CBC): 0.3 (ref 0–0.9)
POC Granulocyte: 3.9 (ref 2–6.9)
POC LYMPH PERCENT: 22.7 %L (ref 10–50)
POC MID %: 5 %M (ref 0–12)
WBC: 5.4 10*3/uL (ref 4.6–10.2)

## 2017-01-25 LAB — GLUCOSE, POCT (MANUAL RESULT ENTRY): POC GLUCOSE: 215 mg/dL — AB (ref 70–99)

## 2017-01-25 MED ORDER — LANTUS SOLOSTAR 100 UNIT/ML ~~LOC~~ SOPN: PEN_INJECTOR | SUBCUTANEOUS | 1 refills | Status: DC

## 2017-01-25 NOTE — Progress Notes (Signed)
Chief Complaint  Patient presents with  . Diabetes    2 week follow-up     HPI   Diabetes Mellitus: Patient presents for follow up of diabetes.  She reports that her fasting glucose this morning was 287.  This week she had 300-400s after a large meal at night.  Patient denies hypoglycemia , nausea and vomitting.  Evaluation to date has been included: fasting blood sugar and hemoglobin A1C.  Home sugars: BGs range between 300 and 400.   This morning she had sonic's breakfast burrito and a strawberry slushy   Lab Results  Component Value Date   HGBA1C 12.4 (H) 01/02/2017   Component     Latest Ref Rng & Units 01/25/2017  POC Glucose     70 - 99 mg/dl 215 (A)   She is exercising by walking more  She is also cutting down on sugary beverages Wt Readings from Last 3 Encounters:  01/25/17 227 lb (103 kg)  01/13/17 231 lb (104.8 kg)  01/07/17 231 lb 3.2 oz (104.9 kg)  She plans to eat tamales for dinner.  Her regimen is 60units lantus am and pm She is using her sliding scale insulin [place] 9mg  Foot Ulcer She reports that her right foot ulcer is almost closed from wound care She states that she keeps the foot cleaned and dry  Past Medical History:  Diagnosis Date  . Allergy   . Arthritis   . Chronic kidney disease   . Chronic pain   . Coronary artery disease   . Diabetes mellitus without complication ([place]   . Dyspnea   . GERD (gastroesophageal reflux disease)   . Heart murmur   . Hypertension   . Liver disease   . NASH (nonalcoholic steatohepatitis) 09/16/2016  . Obstructive sleep apnea   . Osteomyelitis ([place]   . Peripheral vascular disease ([place]   . Sleep apnea   . Stroke ([place]   . Ulcers of both great toes ([place] 11/07/2016   right great toe, started as finger nail size and grew in 6 days     Current Outpatient Medications  Medication Sig Dispense Refill  . atorvastatin (LIPITOR) 80 MG tablet Take 1 tablet (80 mg total) by mouth daily at 6 PM. 30 tablet 0    . busPIRone (BUSPAR) 15 MG tablet Take 1 tablet (15 mg total) by mouth 3 (three) times daily. 90 tablet 1  . carvedilol (COREG) 25 MG tablet Take 1 tablet (25 mg total) by mouth 2 (two) times daily. 180 tablet 1  . clopidogrel (PLAVIX) 75 MG tablet Take 1 tablet (75 mg total) by mouth daily. 30 tablet 0  . exenatide (BYETTA 5 MCG PEN) 5 MCG/0.02ML SOPN injection Inject 0.02 mLs (5 mcg total) into the skin 2 (two) times daily with a meal. 1.2 mL 3  . ezetimibe (ZETIA) 10 MG tablet Take 1 tablet (10 mg total) by mouth daily. 30 tablet 0  . gabapentin (NEURONTIN) 300 MG capsule Take 1 capsule (300 mg total) by mouth 3 (three) times daily. 90 capsule 3  . hydrochlorothiazide (HYDRODIURIL) 25 MG tablet Take 1 tablet (25 mg total) by mouth daily. 90 tablet 1  . insulin aspart (NOVOLOG) 100 UNIT/ML FlexPen Inject 10 Units into the skin 3 (three) times daily before meals. "PER A SLIDING SCALE OF AN ADDITIONAL 2-10 UNITS" 15 mL 3  . LANTUS SOLOSTAR 100 UNIT/ML Solostar Pen 70 units in the MORNING and 60 units at BEDTIME 15 mL 1  . sertraline (  ZOLOFT) 50 MG tablet Take 1 tablet (50 mg total) by mouth daily. 90 tablet 1  . tapentadol (NUCYNTA) 50 MG tablet Take 1 tablet (50 mg total) by mouth 3 (three) times daily. 90 tablet 0  . tiZANidine (ZANAFLEX) 4 MG tablet Take 4 mg by mouth 3 (three) times daily.     No current facility-administered medications for this visit.     Allergies:  Allergies  Allergen Reactions  . Lasix [Furosemide]     Dehydration leading to kidney failure   . Norvasc [Amlodipine Besylate]     Severe edema  . Prednisone Other (See Comments)    Ketoacidosis  . Vancomycin Other (See Comments)    Total organ shut down  . Doxycycline Hives  . Ibuprofen Other (See Comments)    WAS TOLD TO NOT TAKE THIS BECAUSE OF HER KIDNEYS AND LIVER  . Inderal [Propranolol] Cough  . Lisinopril Other (See Comments)    WAS TOLD TO NOT TAKE THIS BECAUSE OF HER KIDNEYS AND LIVER  . Lovastatin  Other (See Comments)    WAS TOLD TO NOT TAKE THIS BECAUSE OF HER KIDNEYS AND LIVER  . Norvasc [Amlodipine Besylate] Other (See Comments)    Severe edema  . Nsaids Other (See Comments)    WAS TOLD TO NOT TAKE THIS BECAUSE OF HER KIDNEYS AND LIVER  . Sulfa Antibiotics Nausea Only    Past Surgical History:  Procedure Laterality Date  . ABDOMINAL HYSTERECTOMY    . BACK SURGERY    . CESAREAN SECTION    . CHOLECYSTECTOMY    . HERNIA REPAIR    . JOINT REPLACEMENT    . KNEE SURGERY    . SPINE SURGERY    . TOE AMPUTATION    . TOE AMPUTATION Bilateral    3 on right foot 2 left foot   . TONSILLECTOMY    . TUBAL LIGATION      Social History   Socioeconomic History  . Marital status: Divorced    Spouse name: None  . Number of children: None  . Years of education: None  . Highest education level: None  Social Needs  . Financial resource strain: None  . Food insecurity - worry: None  . Food insecurity - inability: None  . Transportation needs - medical: None  . Transportation needs - non-medical: None  Occupational History  . None  Tobacco Use  . Smoking status: Never Smoker  . Smokeless tobacco: Never Used  Substance and Sexual Activity  . Alcohol use: No  . Drug use: No  . Sexual activity: No  Other Topics Concern  . None  Social History Narrative   ** Merged History Encounter **        Family History  Problem Relation Age of Onset  . Heart disease Father   . Stroke Maternal Grandfather   . Heart disease Paternal Grandfather   . Cancer Mother        Thymoma, metastatic  . Stroke Father   . Multiple sclerosis Sister   . Epilepsy Sister      ROS Review of Systems See HPI Constitution: No fevers or chills No malaise No diaphoresis Skin: No rash or itching Eyes: no blurry vision, no double vision GU: no dysuria or hematuria Neuro: no dizziness or headaches  all others reviewed and negative   Objective: Vitals:   01/25/17 0923  BP: (!) 108/58  Pulse:  90  Resp: 16  Temp: 98 F (36.7 C)  TempSrc: Oral  SpO2:  98%  Weight: 227 lb (103 kg)  Height: 5' 6.54" (1.69 m)    Physical Exam  Constitutional: She is oriented to person, place, and time. She appears well-developed and well-nourished.  HENT:  Head: Normocephalic and atraumatic.  Eyes: Conjunctivae and EOM are normal.  Cardiovascular: Normal rate and regular rhythm.  Pulmonary/Chest: Effort normal and breath sounds normal. No stridor. No respiratory distress. She has no wheezes.  Abdominal: Normal appearance and bowel sounds are normal. She exhibits distension and fluid wave. She exhibits no ascites. There is no hepatosplenomegaly. There is generalized tenderness. A hernia is present. Hernia confirmed positive in the ventral area.  Neurological: She is alert and oriented to person, place, and time.  Skin: Skin is warm. Capillary refill takes less than 2 seconds.  Psychiatric: She has a normal mood and affect. Her behavior is normal. Judgment and thought content normal.    Xray abd nonobstructed bowel gast pattern No free air  Lab Results  Component Value Date   WBC 5.4 01/25/2017   HGB 13.1 01/02/2017   HCT 39.1 01/02/2017   MCV 81.0 01/02/2017   PLT 144 (L) 01/02/2017    Assessment and Plan [name] was seen today for diabetes.  Diagnoses and all orders for this visit:  Uncontrolled type 2 diabetes mellitus with hyperglycemia ([place]) -     POCT glucose (manual entry) Her appt with Dr. Loanne Drilling is on 03/01/2017 She continues to have hyperglycemia with ranges of fasting glucose 200s but most often 300-400 Will increase lantus to 70 units in the morning for the day time dosage since her evening sugars are the highest and keep her at 60 units every evening.  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 Discussed  proper foot care.   Ventral hernia without obstruction or gangrene Abdominal distension -     DG Abd 1 View; Future -     POCT CBC -     Comprehensive metabolic panel   Generalized abdominal pain -     POCT CBC -     Comprehensive metabolic panel Follow up with Dr. Collene Mares to discuss the presence of fluid in the abdomen and her abdominal distention and intermittent pain. She may also have gastroparesis as a possibility.  Other orders -     LANTUS SOLOSTAR 100 UNIT/ML Solostar Pen; 70 units in the MORNING and 60 units at BEDTIME       [place]

## 2017-01-25 NOTE — Patient Instructions (Addendum)
IF you received an x-ray today, you will receive an invoice from [facility]. Please contact [facility] at [phone] with questions or concerns regarding your invoice.   IF you received labwork today, you will receive an invoice from [facility]. Please contact [facility] at [phone] with questions or concerns regarding your invoice.   Our billing staff will not be able to assist you with questions regarding bills from these companies.  You will be contacted with the lab results as soon as they are available. The fastest way to get your results is to activate your My Chart account. Instructions are located on the last page of this paperwork. If you have not heard from [place] regarding the results in 2 weeks, please contact this office.      Ventral Hernia A ventral hernia is a bulge of tissue from inside the abdomen that pushes through a weak area of the muscles that form the front wall of the abdomen. The tissues inside the abdomen are inside a sac (peritoneum). These tissues include the small intestine, large intestine, and the fatty tissue that covers the intestines (omentum). Sometimes, the bulge that forms a hernia contains intestines. Other hernias contain only fat. Ventral hernias do not go away without surgical treatment. There are several types of ventral hernias. You may have:  A hernia at an incision site from previous abdominal surgery (incisional hernia).  A hernia just above the belly button (epigastric hernia), or at the belly button (umbilical hernia). These types of hernias can develop from heavy lifting or straining.  A hernia that comes and goes (reducible hernia). It may be visible only when you lift or strain. This type of hernia can be pushed back into the abdomen (reduced).  A hernia that traps abdominal tissue inside the hernia (incarcerated hernia). This type of hernia does not reduce.  A hernia that cuts off blood flow to the tissues inside  the hernia (strangulated hernia). The tissues can start to die if this happens. This is a very painful bulge that cannot be reduced. A strangulated hernia is a medical emergency.  What are the causes? This condition is caused by abdominal tissue putting pressure on an area of weakness in the abdominal muscles. What increases the risk? The following factors may make you more likely to develop this condition:  Being female.  Being 85 or older.  Being overweight or obese.  Having had previous abdominal surgery, especially if there was an infection after surgery.  Having had an injury to the abdominal wall.  Having had several pregnancies.  Having a buildup of fluid inside the abdomen (ascites).  What are the signs or symptoms? The only symptom of a ventral hernia may be a painless bulge in the abdomen. A reducible hernia may be visible only when you strain, cough, or lift. Other symptoms may include:  Dull pain.  A feeling of pressure.  Signs and symptoms of a strangulated hernia may include:  Increasing pain.  Nausea and vomiting.  Pain when pressing on the hernia.  The skin over the hernia turning red or purple.  Constipation.  Blood in the stool (feces).  How is this diagnosed? This condition may be diagnosed based on:  Your symptoms.  Your medical history.  A physical exam. You may be asked to cough or strain while standing. These actions increase the pressure inside your abdomen and force the hernia through the opening in your muscles. Your health care provider may try to reduce the  hernia by pressing on it.  Imaging studies, such as an ultrasound or CT scan.  How is this treated? This condition is treated with surgery. If you have a strangulated hernia, surgery is done as soon as possible. If your hernia is small and not incarcerated, you may be asked to lose some weight before surgery. Follow these instructions at home:  Follow instructions from your health  care provider about eating or drinking restrictions.  If you are overweight, your health care provider may recommend that you increase your activity level and eat a healthier diet.  Do not lift anything that is heavier than 10 lb (4.5 kg).  Return to your normal activities as told by your health care provider. Ask your health care provider what activities are safe for you. You may need to avoid activities that increase pressure on your hernia.  Take over-the-counter and prescription medicines only as told by your health care provider.  Keep all follow-up visits as told by your health care provider. This is important. Contact a health care provider if:  Your hernia gets larger.  Your hernia becomes painful. Get help right away if:  Your hernia becomes increasingly painful.  You have pain along with any of the following: ? Changes in skin color in the area of the hernia. ? Nausea. ? Vomiting. ? Fever. Summary  A ventral hernia is a bulge of tissue from inside the abdomen that pushes through a weak area of the muscles that form the front wall of the abdomen.  This condition is treated with surgery, which may be urgent depending on your hernia.  Do not lift anything that is heavier than 10 lb (4.5 kg), and follow activity instructions from your health care provider. This information is not intended to replace advice given to you by your health care provider. Make sure you discuss any questions you have with your health care provider. Document Released: 01/06/2012 Document Revised: 09/06/2015 Document Reviewed: 09/06/2015 Elsevier Interactive Patient Education  [employer].

## 2017-01-26 LAB — COMPREHENSIVE METABOLIC PANEL
ALT: 19 IU/L (ref 0–32)
AST: 20 IU/L (ref 0–40)
Albumin/Globulin Ratio: 1.4 (ref 1.2–2.2)
Albumin: 3.8 g/dL (ref 3.6–4.8)
Alkaline Phosphatase: 91 IU/L (ref 39–117)
BUN/Creatinine Ratio: 21 (ref 12–28)
BUN: 30 mg/dL — AB (ref 8–27)
Bilirubin Total: 0.4 mg/dL (ref 0.0–1.2)
CALCIUM: 9.4 mg/dL (ref 8.7–10.3)
CO2: 21 mmol/L (ref 20–29)
CREATININE: 1.43 mg/dL — AB (ref 0.57–1.00)
Chloride: 98 mmol/L (ref 96–106)
GFR calc Af Amer: 46 mL/min/{1.73_m2} — ABNORMAL LOW (ref >59)
GFR, EST NON AFRICAN AMERICAN: 40 mL/min/{1.73_m2} — AB (ref >59)
GLOBULIN, TOTAL: 2.8 g/dL (ref 1.5–4.5)
GLUCOSE: 292 mg/dL — AB (ref 65–99)
Potassium: 3.8 mmol/L (ref 3.5–5.2)
SODIUM: 135 mmol/L (ref 134–144)
Total Protein: 6.6 g/dL (ref 6.0–8.5)

## 2017-01-27 ENCOUNTER — Telehealth: Payer: Self-pay | Admitting: Family Medicine

## 2017-01-27 DIAGNOSIS — G459 Transient cerebral ischemic attack, unspecified: Secondary | ICD-10-CM

## 2017-01-27 NOTE — Telephone Encounter (Signed)
Copied from [place]. Topic: Quick Communication - See Telephone Encounter >> Jan 27, 2017  1:24 PM Robina Ade, Helene Kelp D wrote: CRM for notification. See Telephone encounter for: 01/27/17. Patient called and would like to talk to provider or CMA about three medications. When she was in the hospital they gave her atorvastatin (LIPITOR) 80 MG tablet, clopidogrel (PLAVIX) 75 MG tablet and ezetimibe (ZETIA) 10 MG tablet and would like to know if provider would like to keep her on these medications. Please call patient back, thanks.

## 2017-01-28 ENCOUNTER — Ambulatory Visit: Payer: Medicare HMO | Admitting: Dietician

## 2017-01-29 NOTE — Telephone Encounter (Signed)
Forwarded message to Dr. Nolon Rod

## 2017-02-03 ENCOUNTER — Ambulatory Visit (INDEPENDENT_AMBULATORY_CARE_PROVIDER_SITE_OTHER): Payer: Medicare HMO | Admitting: Family

## 2017-02-04 NOTE — Telephone Encounter (Signed)
Patient doesn't have an [facility]t with [facility] until Sat. 03/13/17 so she will need her prescription for Nucynta 48m extended. She is also looking for her Buspirone refill.

## 2017-02-05 ENCOUNTER — Ambulatory Visit: Payer: Medicare HMO | Admitting: Cardiology

## 2017-02-08 ENCOUNTER — Ambulatory Visit (INDEPENDENT_AMBULATORY_CARE_PROVIDER_SITE_OTHER): Payer: Medicare HMO | Admitting: Family

## 2017-02-08 ENCOUNTER — Encounter (INDEPENDENT_AMBULATORY_CARE_PROVIDER_SITE_OTHER): Payer: Self-pay | Admitting: Family

## 2017-02-08 DIAGNOSIS — L97511 Non-pressure chronic ulcer of other part of right foot limited to breakdown of skin: Secondary | ICD-10-CM

## 2017-02-08 MED ORDER — TAPENTADOL HCL 50 MG PO TABS: 50.0000 mg | ORAL_TABLET | Freq: Three times a day (TID) | ORAL | 0 refills | Status: DC

## 2017-02-08 MED ORDER — TAPENTADOL HCL 50 MG PO TABS: 50.0000 mg | ORAL_TABLET | Freq: Three times a day (TID) | ORAL | 0 refills | Status: AC

## 2017-02-08 MED ORDER — EZETIMIBE 10 MG PO TABS: 10.0000 mg | ORAL_TABLET | Freq: Every day | ORAL | 3 refills | Status: DC

## 2017-02-08 MED ORDER — CLOPIDOGREL BISULFATE 75 MG PO TABS: 75.0000 mg | ORAL_TABLET | Freq: Every day | ORAL | 3 refills | Status: DC

## 2017-02-08 MED ORDER — ATORVASTATIN CALCIUM 80 MG PO TABS: 80.0000 mg | ORAL_TABLET | Freq: Every day | ORAL | 3 refills | Status: DC

## 2017-02-08 MED ORDER — BUSPIRONE HCL 15 MG PO TABS: 15.0000 mg | ORAL_TABLET | Freq: Three times a day (TID) | ORAL | 3 refills | Status: DC

## 2017-02-08 NOTE — Progress Notes (Signed)
Office Visit Note   Patient: [name]           Date of Birth: 1955/12/27           MRN: [id number] Visit Date: 02/08/2017              Requested by: Forrest Moron, MD [place], [place] [id number] PCP: Forrest Moron, MD  No chief complaint on file.     HPI: Patient is a 62 year old woman who is seen in follow up for evaluation for ulceration beneath the first metatarsal head right foot.  Patient has had a history of osteomyelitis of the second third and fourth toes and has had these amputated. She is currently using Promogran dressing changes she is in a Darco shoe.      Assesment & Plan: Visit Diagnoses:  No diagnosis found.  Plan: Continue the felt pressure relieving donut beneath the first metatarsal head for the right foot.  She will use soap and water cleansing and dry dressing change daily with the dressing materials provided today. [place] to contact patient and provide supplies. Discussed importance of nonweightbearing and that she should use the shoe for only minimum activities primarily she should ambulate in a wheelchair.  Follow-Up Instructions: No Follow-up on file.   Ortho Exam  Patient is alert, oriented, no adenopathy, well-dressed, normal affect, normal respiratory effort. Examination patient has a palpable pulse she has good dorsiflexion of the ankle she has a Wagner grade 1 ulcer beneath the first metatarsal head of the right foot.  After informed consent a 10 blade knife was used to debride the skin and soft tissue back to bleeding viable granulation tissue this was touched with silver nitrate for hemostasis a Band-Aid was applied.  The fissure has 100% beefy granulation tissue; the ulcer is 15 x 3 mm and 2 mm deep. Slight surrounding erythema. No ascending cellulitis. No surrounding maceration. No odor. No drainage.   Imaging: No results found. No images are attached to the encounter.  Labs: Lab Results  Component Value Date   HGBA1C  12.4 (H) 01/02/2017   HGBA1C 12.7 11/23/2016   HGBA1C 12.2 (H) 09/21/2016   ESRSEDRATE 39 (H) 12/05/2016   CRP 3.7 (H) 12/05/2016   REPTSTATUS 10/31/2016 FINAL 10/28/2016   CULT >=100,000 COLONIES/mL ESCHERICHIA COLI (A) 10/28/2016   LABORGA ESCHERICHIA COLI (A) 10/28/2016    Orders:  No orders of the defined types were placed in this encounter.  No orders of the defined types were placed in this encounter.    Procedures: No procedures performed  Clinical Data: No additional findings.  ROS:  All other systems negative, except as noted in the HPI. Review of Systems  Constitutional: Negative for chills and fever.  Skin: Positive for wound. Negative for color change.    Objective: Vital Signs: There were no vitals taken for this visit.  Specialty Comments:  No specialty comments available.  PMFS History: Patient Active Problem List   Diagnosis Date Noted  . Aortic atherosclerosis ([place]) 01/13/2017  . At high risk for falls 01/13/2017  . Uncontrolled type 2 diabetes mellitus with hyperglycemia ([place]) 01/13/2017  . Stroke-like symptoms   . Complicated migraine   . Mixed hyperlipidemia   . Generalized weakness   . TIA (transient ischemic attack) 01/02/2017  . Ulcer of toe of right foot, limited to breakdown of skin ([place]) 12/18/2016  . Non-pressure chronic ulcer of other part of right foot limited to breakdown of skin ([place]) 12/07/2016  .  Diabetic foot ulcer ([place]) 12/04/2016  . Hypovolemia 12/04/2016  . Chronic pain syndrome 12/04/2016  . Diabetic peripheral neuropathy ([place]) 12/04/2016  . Hypotension   . Liver mass 09/20/2016  . Kidney disease, chronic, stage III (GFR 30-59 ml/min) (HCC) 12/17/2015  . Diabetes mellitus ([place]) 07/18/2015  . Essential hypertension 06/06/2015  . Chronic back pain 07/27/2012  . OSA on CPAP 03/21/2012   Past Medical History:  Diagnosis Date  . Allergy   . Arthritis   . Chronic kidney disease   . Chronic pain   . Coronary artery  disease   . Diabetes mellitus without complication ([place])   . Dyspnea   . GERD (gastroesophageal reflux disease)   . Heart murmur   . Hypertension   . Liver disease   . NASH (nonalcoholic steatohepatitis) 09/16/2016  . Obstructive sleep apnea   . Osteomyelitis ([place])   . Peripheral vascular disease ([place])   . Sleep apnea   . Stroke ([place])   . Ulcers of both great toes ([place]) 11/07/2016   right great toe, started as finger nail size and grew in 6 days     Family History  Problem Relation Age of Onset  . Heart disease Father   . Stroke Maternal Grandfather   . Heart disease Paternal Grandfather   . Cancer Mother        Thymoma, metastatic  . Stroke Father   . Multiple sclerosis Sister   . Epilepsy Sister     Past Surgical History:  Procedure Laterality Date  . ABDOMINAL HYSTERECTOMY    . BACK SURGERY    . CESAREAN SECTION    . CHOLECYSTECTOMY    . HERNIA REPAIR    . JOINT REPLACEMENT    . KNEE SURGERY    . SPINE SURGERY    . TOE AMPUTATION    . TOE AMPUTATION Bilateral    3 on right foot 2 left foot   . TONSILLECTOMY    . TUBAL LIGATION     Social History   Occupational History  . Not on file  Tobacco Use  . Smoking status: Never Smoker  . Smokeless tobacco: Never Used  Substance and Sexual Activity  . Alcohol use: No  . Drug use: No  . Sexual activity: No

## 2017-02-08 NOTE — Telephone Encounter (Signed)
Refills done Controlled sent to pharmacy Shredded paper request

## 2017-02-11 ENCOUNTER — Ambulatory Visit: Payer: Medicare HMO | Admitting: Dietician

## 2017-02-15 ENCOUNTER — Ambulatory Visit: Payer: Medicare HMO | Admitting: Cardiology

## 2017-02-24 ENCOUNTER — Ambulatory Visit: Payer: Medicare HMO | Admitting: Family Medicine

## 2017-02-24 ENCOUNTER — Other Ambulatory Visit: Payer: Self-pay

## 2017-02-24 ENCOUNTER — Encounter (INDEPENDENT_AMBULATORY_CARE_PROVIDER_SITE_OTHER): Payer: Self-pay | Admitting: Family

## 2017-02-24 ENCOUNTER — Ambulatory Visit (INDEPENDENT_AMBULATORY_CARE_PROVIDER_SITE_OTHER): Payer: Medicare HMO | Admitting: Family

## 2017-02-24 ENCOUNTER — Ambulatory Visit (INDEPENDENT_AMBULATORY_CARE_PROVIDER_SITE_OTHER): Payer: Medicare HMO | Admitting: Family Medicine

## 2017-02-24 ENCOUNTER — Encounter: Payer: Self-pay | Admitting: Family Medicine

## 2017-02-24 VITALS — Ht 66.0 in | Wt 227.0 lb

## 2017-02-24 VITALS — BP 100/60 | HR 87 | Temp 98.5°F | Resp 17 | Ht 66.0 in | Wt 223.8 lb

## 2017-02-24 DIAGNOSIS — F32A Depression, unspecified: Secondary | ICD-10-CM

## 2017-02-24 DIAGNOSIS — Z9181 History of falling: Secondary | ICD-10-CM | POA: Diagnosis not present

## 2017-02-24 DIAGNOSIS — L97411 Non-pressure chronic ulcer of right heel and midfoot limited to breakdown of skin: Secondary | ICD-10-CM

## 2017-02-24 DIAGNOSIS — L97511 Non-pressure chronic ulcer of other part of right foot limited to breakdown of skin: Secondary | ICD-10-CM | POA: Diagnosis not present

## 2017-02-24 DIAGNOSIS — F419 Anxiety disorder, unspecified: Secondary | ICD-10-CM | POA: Diagnosis not present

## 2017-02-24 DIAGNOSIS — I10 Essential (primary) hypertension: Secondary | ICD-10-CM | POA: Diagnosis not present

## 2017-02-24 DIAGNOSIS — E08621 Diabetes mellitus due to underlying condition with foot ulcer: Secondary | ICD-10-CM | POA: Diagnosis not present

## 2017-02-24 DIAGNOSIS — E1165 Type 2 diabetes mellitus with hyperglycemia: Secondary | ICD-10-CM

## 2017-02-24 DIAGNOSIS — F4323 Adjustment disorder with mixed anxiety and depressed mood: Secondary | ICD-10-CM

## 2017-02-24 DIAGNOSIS — F329 Major depressive disorder, single episode, unspecified: Secondary | ICD-10-CM

## 2017-02-24 LAB — GLUCOSE, POCT (MANUAL RESULT ENTRY): POC Glucose: 434 mg/dl — AB (ref 70–99)

## 2017-02-24 MED ORDER — LANTUS SOLOSTAR 100 UNIT/ML ~~LOC~~ SOPN: PEN_INJECTOR | SUBCUTANEOUS | 6 refills | Status: DC

## 2017-02-24 NOTE — Progress Notes (Signed)
Office Visit Note   Patient: [name]           Date of Birth: 16-Jan-1956           MRN: [id number] Visit Date: 02/24/2017              Requested by: Forrest Moron, MD [place], [place] [id number] PCP: Forrest Moron, MD  Chief Complaint  Patient presents with  . Right Foot - Open Wound      HPI: Patient is a 62 year old woman who is seen in follow up for evaluation for ulceration beneath the first metatarsal head right foot.  Patient has had a history of osteomyelitis of the second third and fourth toes and has had these amputated. She is currently using collagen dressing changes. she is in a Darco shoe. Concerned today about odor and build up of "callus."  States has been out of Promogran for 2 months or so. Wonders if can get HH to come out and assist with wound care.    Assesment & Plan: Visit Diagnoses:  1. Diabetic ulcer of right heel associated with diabetes mellitus due to underlying condition, limited to breakdown of skin ([place])   2. Non-pressure chronic ulcer of other part of right foot limited to breakdown of skin ([place])     Plan: Continue the felt pressure relieving donut beneath the first metatarsal head for the right foot.  She will use dial soap and water cleansing and collagen dressings. Advised against cleansing with Peroxide. change daily with the dressing materials provided by [facility], have updated orders with them. Discussed importance of nonweightbearing and that she should use the shoe for only minimum activities primarily she should ambulate in a wheelchair.  Follow-Up Instructions: Return in about 3 weeks (around 03/17/2017).   Ortho Exam  Patient is alert, oriented, no adenopathy, well-dressed, normal affect, normal respiratory effort. Examination patient has a palpable pulse she has good dorsiflexion of the ankle she has a Wagner grade 1 ulcer beneath the first metatarsal head of the right foot.  After informed consent a 10 blade knife was  used to debride the nonviable tissue back to bleeding viable granulation tissue this was touched with silver nitrate for hemostasis a Band-Aid was applied. Ulceration is 20 x 25 mm with medial fissure which is 3 mm deep. has 100% beefy granulation tissue; the ulcer is 15 x 3 mm and 2 mm deep. Slight erythema surrounding, no maceration. Odor from moisture. No drainage.   Imaging: No results found. No images are attached to the encounter.  Labs: Lab Results  Component Value Date   HGBA1C 12.4 (H) 01/02/2017   HGBA1C 12.7 11/23/2016   HGBA1C 12.2 (H) 09/21/2016   ESRSEDRATE 39 (H) 12/05/2016   CRP 3.7 (H) 12/05/2016   REPTSTATUS 10/31/2016 FINAL 10/28/2016   CULT >=100,000 COLONIES/mL ESCHERICHIA COLI (A) 10/28/2016   LABORGA ESCHERICHIA COLI (A) 10/28/2016    Orders:  No orders of the defined types were placed in this encounter.  No orders of the defined types were placed in this encounter.    Procedures: No procedures performed  Clinical Data: No additional findings.  ROS:  All other systems negative, except as noted in the HPI. Review of Systems  Constitutional: Negative for chills and fever.  Skin: Positive for wound. Negative for color change.    Objective: Vital Signs: Ht 5' 6"  (1.676 m)   Wt 227 lb (103 kg)   BMI 36.64 kg/m   Specialty Comments:  No specialty comments available.  PMFS History: Patient Active Problem List   Diagnosis Date Noted  . Aortic atherosclerosis ([place]) 01/13/2017  . At high risk for falls 01/13/2017  . Uncontrolled type 2 diabetes mellitus with hyperglycemia ([place]) 01/13/2017  . Stroke-like symptoms   . Complicated migraine   . Mixed hyperlipidemia   . Generalized weakness   . TIA (transient ischemic attack) 01/02/2017  . Ulcer of toe of right foot, limited to breakdown of skin ([place]) 12/18/2016  . Non-pressure chronic ulcer of other part of right foot limited to breakdown of skin ([place]) 12/07/2016  . Diabetic foot ulcer ([place])  12/04/2016  . Hypovolemia 12/04/2016  . Chronic pain syndrome 12/04/2016  . Diabetic peripheral neuropathy ([place]) 12/04/2016  . Hypotension   . Liver mass 09/20/2016  . Kidney disease, chronic, stage III (GFR 30-59 ml/min) (HCC) 12/17/2015  . Diabetes mellitus ([place]) 07/18/2015  . Essential hypertension 06/06/2015  . Chronic back pain 07/27/2012  . OSA on CPAP 03/21/2012   Past Medical History:  Diagnosis Date  . Allergy   . Arthritis   . Chronic kidney disease   . Chronic pain   . Coronary artery disease   . Diabetes mellitus without complication ([place])   . Dyspnea   . GERD (gastroesophageal reflux disease)   . Heart murmur   . Hypertension   . Liver disease   . NASH (nonalcoholic steatohepatitis) 09/16/2016  . Obstructive sleep apnea   . Osteomyelitis ([place])   . Peripheral vascular disease ([place])   . Sleep apnea   . Stroke ([place])   . Ulcers of both great toes ([place]) 11/07/2016   right great toe, started as finger nail size and grew in 6 days     Family History  Problem Relation Age of Onset  . Heart disease Father   . Stroke Maternal Grandfather   . Heart disease Paternal Grandfather   . Cancer Mother        Thymoma, metastatic  . Stroke Father   . Multiple sclerosis Sister   . Epilepsy Sister     Past Surgical History:  Procedure Laterality Date  . ABDOMINAL HYSTERECTOMY    . BACK SURGERY    . CESAREAN SECTION    . CHOLECYSTECTOMY    . HERNIA REPAIR    . JOINT REPLACEMENT    . KNEE SURGERY    . SPINE SURGERY    . TOE AMPUTATION    . TOE AMPUTATION Bilateral    3 on right foot 2 left foot   . TONSILLECTOMY    . TUBAL LIGATION     Social History   Occupational History  . Not on file  Tobacco Use  . Smoking status: Never Smoker  . Smokeless tobacco: Never Used  Substance and Sexual Activity  . Alcohol use: No  . Drug use: No  . Sexual activity: No

## 2017-02-24 NOTE — Patient Instructions (Addendum)
   IF you received an x-ray today, you will receive an invoice from [facility]. Please contact [facility] at [phone] with questions or concerns regarding your invoice.   IF you received labwork today, you will receive an invoice from [facility]. Please contact [facility] at [phone] with questions or concerns regarding your invoice.   Our billing staff will not be able to assist you with questions regarding bills from these companies.  You will be contacted with the lab results as soon as they are available. The fastest way to get your results is to activate your My Chart account. Instructions are located on the last page of this paperwork. If you have not heard from us regarding the results in 2 weeks, please contact this office.     Adjustment Disorder, Adult Adjustment disorder is a group of symptoms that can develop after a stressful life event, such as the loss of a job or serious physical illness. The symptoms can affect how you feel, think, and act. They may interfere with your relationships. Adjustment disorder increases your risk of suicide and substance abuse. If this disorder is not managed early, it can develop into a more serious condition, such as major depressive disorder or post-traumatic stress disorder. What are the causes? This condition happens when you have trouble recovering from or coping with a stressful life event. What increases the risk? You are more likely to develop this condition if:  You have had depression or anxiety.  You are being treated for a long-term (chronic) illness.  You are being treated for an illness that cannot be cured (terminal illness).  You have a family history of mental illness.  What are the signs or symptoms? Symptoms of this condition include:  Extreme trouble doing daily tasks, such as going to work.  Sadness, depression, or crying spells.  Worrying a lot.  Loss of enjoyment.  Change in appetite  or weight.  Feelings of loss or hopelessness.  Thoughts of suicide.  Anxiety, worry, or nervousness.  Trouble sleeping.  Avoiding family and friends.  Fighting or vandalism.  Complaining of feeling sick without being ill.  Feeling dazed or disconnected.  Nightmares.  Trouble sleeping.  Irritability.  Reckless driving.  Poor work performance.  Ignoring bills.  Symptoms of this condition start within three months of the stressful event. They do not last more than six months, unless the stressful circumstances last longer. Normal grieving after the death of a loved one is not a symptom of this condition. How is this diagnosed? To diagnose this condition, your health care provider will ask about what has happened in your life and how it has affected you. He or she may also ask about your medical history and your use of medicines, alcohol, and other substances. Your health care provider may do a physical exam and order lab tests or other studies. You may be referred to a mental health specialist. How is this treated? Treatment options for this condition include:  Counseling or talk therapy. Talk therapy is usually provided by mental health specialists.  Medicines. Certain medicines may help with depression, anxiety, and sleep.  Support groups. These offer emotional support, advice, and guidance. They are made up of people who have had similar experiences.  Observation and time. This is sometimes called "watchful waiting." In this treatment, health care providers monitor your health and behavior without other treatment. Adjustment disorder sometimes gets better on its own with time.  Follow these instructions at home:  Take   over-the-counter and prescription medicines only as told by your health care provider.  Keep all follow-up visits as told by your health care provider. This is important. Contact a health care provider if:  Your symptoms do not improve in six  months.  Your symptoms get worse. Get help right away if:  You have serious thoughts about hurting yourself or someone else. If you ever feel like you may hurt yourself or others, or have thoughts about taking your own life, get help right away. You can go to your nearest emergency department or call:  Your local emergency services (911 in the [place]  A suicide crisis helpline, such as the National Suicide Prevention Lifeline at 1-800-273-8255. This is open 24 hours a day.  Summary  Adjustment disorder is a group of symptoms that can develop after a stressful life event, such as the loss of a job or serious physical illness. The symptoms can affect how you feel, think, and act. They may interfere with your relationships.  Symptoms of this condition start within three months of the stressful event. They do not last more than six months, unless the stressful circumstances last longer.  Treatment may include talk therapy, medicines, participation in a support group, or observation to see if symptoms improve.  Contact your health care provider if your symptoms get worse or do not improve in six months.  If you ever feel like you may hurt yourself or others, or have thoughts about taking your own life, get help right away. This information is not intended to replace advice given to you by your health care provider. Make sure you discuss any questions you have with your health care provider. Document Released: 09/23/2005 Document Revised: 03/20/2016 Document Reviewed: 03/20/2016 Elsevier Interactive Patient Education  2018 [employer]  

## 2017-02-24 NOTE — Progress Notes (Signed)
Chief Complaint  Patient presents with  . Diabetes    HPI   Diabetes Mellitus: Patient presents for follow up of diabetes. Symptoms: increase appetite. Symptoms have stabilized. Patient denies hypoglycemia , nausea, polydipsia and polyuria.  Evaluation to date has been included: hemoglobin A1C.  Home sugars: high all the times.  Lab Results  Component Value Date   HGBA1C 12.4 (H) 01/02/2017   Component                                 Latest Ref Rng & Units 02/24/2017  POC Glucose                                       70 - 99 mg/dl 434 (A)    She has an appointment for establish with Dr. Loanne Drilling for Endocrinology. She is currently on  Exenatide 69mg bid Lantus 70 units am and 60 units pm novolog 10 units with sliding scale  Lab Results  Component Value Date   CREATININE 1.43 (H) 01/25/2017   She has read about toujeo and is concerned about adding on another medication  Dysthymia She denies any depression but states that she has apathy and feels like 2 days a week she does not care She is here with her daughter who reports that the patient has depression She shows pictures of the living space and the patient's bedroom has furniture and pictures all over She sometimes will snack on cookies The patient states that her daughter is talking about putting her in a nursing home. She reports that her wound is cleaned and she had right foot debridement Her daughter reports that the patient does not shower more than once every 3 days [facility] screen [facility] 02/24/2017 01/25/2017 01/07/2017 11/23/2016  Decreased Interest 0 0 0 0  Down, Depressed, Hopeless 0 0 0 0  PHQ - 2 Score 0 0 0 0   Anxiety She takes both medications for anxiety but her daughter feels She and her daughter are arguing about her placement Her daughter reports that she is currently 39w pregnant and is not sure if her mother can continue living with her and is wondering if she can go into a nursing home. The patient  states that she is very mellow on the buspar and zoloft     Hypertension: Patient here for follow-up of elevated blood pressure. She is not exercising and is not adherent to low salt diet.  Blood pressure is well controlled at home. Cardiac symptoms none. Patient denies chest pain, chest pressure/discomfort, exertional chest pressure/discomfort, irregular heart beat and lower extremity edema.  Cardiovascular risk factors: diabetes mellitus, dyslipidemia, family history of premature cardiovascular disease, hypertension, obesity (BMI >= 30 kg/m2) and sedentary lifestyle.  BP Readings from Last 3 Encounters:  02/24/17 100/60  01/25/17 (!) 108/58  01/07/17 132/70     Past Medical History:  Diagnosis Date  . Allergy   . Arthritis   . Chronic kidney disease   . Chronic pain   . Coronary artery disease   . [place]   . Dyspnea   . GERD (gastroesophageal reflux disease)   . Heart murmur   . Hypertension   . Liver disease   . NASH (nonalcoholic steatohepatitis) 09/16/2016  . Obstructive sleep apnea   . [place]   .  Peripheral vascular disease ([place])   . Sleep apnea   . Stroke ([place])   . Ulcers of both great toes ([place]) 11/07/2016   right great toe, started as finger nail size and grew in 6 days     Current Outpatient Medications  Medication Sig Dispense Refill  . atorvastatin (LIPITOR) 80 MG tablet Take 1 tablet (80 mg total) by mouth daily at 6 PM. 90 tablet 3  . busPIRone (BUSPAR) 15 MG tablet Take 1 tablet (15 mg total) by mouth 3 (three) times daily. 90 tablet 3  . carvedilol (COREG) 25 MG tablet Take 1 tablet (25 mg total) by mouth 2 (two) times daily. 180 tablet 1  . clopidogrel (PLAVIX) 75 MG tablet Take 1 tablet (75 mg total) by mouth daily. 90 tablet 3  . exenatide (BYETTA 5 MCG PEN) 5 MCG/0.02ML SOPN injection Inject 0.02 mLs (5 mcg total) into the skin 2 (two) times daily with a meal. 1.2 mL 3  . ezetimibe (ZETIA) 10 MG tablet  Take 1 tablet (10 mg total) by mouth daily. 90 tablet 3  . gabapentin (NEURONTIN) 300 MG capsule Take 1 capsule (300 mg total) by mouth 3 (three) times daily. 90 capsule 3  . hydrochlorothiazide (HYDRODIURIL) 25 MG tablet Take 1 tablet (25 mg total) by mouth daily. 90 tablet 1  . insulin aspart (NOVOLOG) 100 UNIT/ML FlexPen Inject 10 Units into the skin 3 (three) times daily before meals. "PER A SLIDING SCALE OF AN ADDITIONAL 2-10 UNITS" 15 mL 3  . LANTUS SOLOSTAR 100 UNIT/ML Solostar Pen 70 units in the MORNING and 60 units at BEDTIME 15 mL 1  . sertraline (ZOLOFT) 50 MG tablet Take 1 tablet (50 mg total) by mouth daily. 90 tablet 1  . tapentadol (NUCYNTA) 50 MG tablet Take 1 tablet (50 mg total) by mouth 3 (three) times daily. 90 tablet 0  . tiZANidine (ZANAFLEX) 4 MG tablet Take 4 mg by mouth 3 (three) times daily.     No current facility-administered medications for this visit.     Allergies:  Allergies  Allergen Reactions  . Lasix [Furosemide]     Dehydration leading to kidney failure   . Norvasc [Amlodipine Besylate]     Severe edema  . Prednisone Other (See Comments)    Ketoacidosis  . Vancomycin Other (See Comments)    Total organ shut down  . Doxycycline Hives  . Ibuprofen Other (See Comments)    WAS TOLD TO NOT TAKE THIS BECAUSE OF HER KIDNEYS AND LIVER  . Inderal [Propranolol] Cough  . Lisinopril Other (See Comments)    WAS TOLD TO NOT TAKE THIS BECAUSE OF HER KIDNEYS AND LIVER  . Lovastatin Other (See Comments)    WAS TOLD TO NOT TAKE THIS BECAUSE OF HER KIDNEYS AND LIVER  . Norvasc [Amlodipine Besylate] Other (See Comments)    Severe edema  . Nsaids Other (See Comments)    WAS TOLD TO NOT TAKE THIS BECAUSE OF HER KIDNEYS AND LIVER  . Sulfa Antibiotics Nausea Only    Past Surgical History:  Procedure Laterality Date  . ABDOMINAL HYSTERECTOMY    . BACK SURGERY    . CESAREAN SECTION    . CHOLECYSTECTOMY    . HERNIA REPAIR    . JOINT REPLACEMENT    . KNEE  SURGERY    . SPINE SURGERY    . TOE AMPUTATION    . TOE AMPUTATION Bilateral    3 on right foot 2 left foot   .  TONSILLECTOMY    . TUBAL LIGATION      Social History   Socioeconomic History  . Marital status: Divorced    Spouse name: None  . Number of children: None  . Years of education: None  . Highest education level: None  Social Needs  . Financial resource strain: None  . Food insecurity - worry: None  . Food insecurity - inability: None  . Transportation needs - medical: None  . Transportation needs - non-medical: None  Occupational History  . None  Tobacco Use  . Smoking status: Never Smoker  . Smokeless tobacco: Never Used  Substance and Sexual Activity  . Alcohol use: No  . Drug use: No  . Sexual activity: No  Other Topics Concern  . None  Social History Narrative   ** Merged History Encounter **        Family History  Problem Relation Age of Onset  . Heart disease Father   . Stroke Maternal Grandfather   . Heart disease Paternal Grandfather   . Cancer Mother        Thymoma, metastatic  . Stroke Father   . Multiple sclerosis Sister   . Epilepsy Sister      ROS Review of Systems See HPI Constitution: No fevers or chills No malaise No diaphoresis Skin: No rash or itching Eyes: no blurry vision, no double vision GU: no dysuria or hematuria Neuro: no dizziness or headaches all others reviewed and negative   Objective: Vitals:   02/24/17 1446  BP: 100/60  Pulse: 87  Resp: 17  Temp: 98.5 F (36.9 C)  TempSrc: Oral  SpO2: 94%  Weight: 223 lb 12.8 oz (101.5 kg)  Height: 5' 6"  (1.676 m)    Physical Exam  Constitutional: She is oriented to person, place, and time. She appears well-developed and well-nourished.  HENT:  Head: Normocephalic and atraumatic.  Eyes: Conjunctivae and EOM are normal.  Cardiovascular: Normal rate, regular rhythm and normal heart sounds.  No murmur heard. Pulmonary/Chest: Effort normal and breath sounds  normal. No stridor. No respiratory distress.  Neurological: She is alert and oriented to person, place, and time.  Skin: Skin is warm. Capillary refill takes less than 2 seconds.  Psychiatric: She has a normal mood and affect. Her behavior is normal. Judgment and thought content normal.    Assessment and Plan Domonic was seen today for diabetes.  Diagnoses and all orders for this visit:  Uncontrolled type 2 diabetes mellitus with hyperglycemia ([place]) -     POCT glucose (manual entry) -     referral to Endocrinology upcoming -     Discussed that she may benefit from long acting meds and GLP-1 and Endocrinology can help with coverage Pt plans to be more compliant with diet Will refer to [facility] for [facility] health counseling  Diabetic ulcer of right heel associated with diabetes mellitus due to underlying condition, limited to breakdown of skin ([place])-  Continue with wound clinic  Essential hypertension- bp at goal  At high risk for falls- encouraged pt to continue to use her assistive device (cane)   Adjustment disorder with anxiety and depression Referral placed for [facility] for counseling Pt with multiple chronic medical problems and interpersonal conflicts   A total of 40 minutes were spent face-to-face with the patient during this encounter and over half of that time was spent on counseling and coordination of care.    Mucarabones

## 2017-02-24 NOTE — Progress Notes (Deleted)
No chief complaint on file.   HPI  4 review of systems  Past Medical History:  Diagnosis Date  . Allergy   . Arthritis   . Chronic kidney disease   . Chronic pain   . Coronary artery disease   . Diabetes mellitus without complication ([place])   . Dyspnea   . GERD (gastroesophageal reflux disease)   . Heart murmur   . Hypertension   . Liver disease   . NASH (nonalcoholic steatohepatitis) 09/16/2016  . Obstructive sleep apnea   . Osteomyelitis ([place])   . Peripheral vascular disease ([place])   . Sleep apnea   . Stroke ([place])   . Ulcers of both great toes ([place]) 11/07/2016   right great toe, started as finger nail size and grew in 6 days     Current Outpatient Medications  Medication Sig Dispense Refill  . atorvastatin (LIPITOR) 80 MG tablet Take 1 tablet (80 mg total) by mouth daily at 6 PM. 90 tablet 3  . busPIRone (BUSPAR) 15 MG tablet Take 1 tablet (15 mg total) by mouth 3 (three) times daily. 90 tablet 3  . carvedilol (COREG) 25 MG tablet Take 1 tablet (25 mg total) by mouth 2 (two) times daily. 180 tablet 1  . clopidogrel (PLAVIX) 75 MG tablet Take 1 tablet (75 mg total) by mouth daily. 90 tablet 3  . exenatide (BYETTA 5 MCG PEN) 5 MCG/0.02ML SOPN injection Inject 0.02 mLs (5 mcg total) into the skin 2 (two) times daily with a meal. 1.2 mL 3  . ezetimibe (ZETIA) 10 MG tablet Take 1 tablet (10 mg total) by mouth daily. 90 tablet 3  . gabapentin (NEURONTIN) 300 MG capsule Take 1 capsule (300 mg total) by mouth 3 (three) times daily. 90 capsule 3  . hydrochlorothiazide (HYDRODIURIL) 25 MG tablet Take 1 tablet (25 mg total) by mouth daily. 90 tablet 1  . insulin aspart (NOVOLOG) 100 UNIT/ML FlexPen Inject 10 Units into the skin 3 (three) times daily before meals. "PER A SLIDING SCALE OF AN ADDITIONAL 2-10 UNITS" 15 mL 3  . LANTUS SOLOSTAR 100 UNIT/ML Solostar Pen 70 units in the MORNING and 60 units at BEDTIME 15 mL 1  . sertraline (ZOLOFT) 50 MG tablet Take 1 tablet (50 mg total) by  mouth daily. 90 tablet 1  . tapentadol (NUCYNTA) 50 MG tablet Take 1 tablet (50 mg total) by mouth 3 (three) times daily. 90 tablet 0  . tiZANidine (ZANAFLEX) 4 MG tablet Take 4 mg by mouth 3 (three) times daily.     No current facility-administered medications for this visit.     Allergies:  Allergies  Allergen Reactions  . Lasix [Furosemide]     Dehydration leading to kidney failure   . Norvasc [Amlodipine Besylate]     Severe edema  . Prednisone Other (See Comments)    Ketoacidosis  . Vancomycin Other (See Comments)    Total organ shut down  . Doxycycline Hives  . Ibuprofen Other (See Comments)    WAS TOLD TO NOT TAKE THIS BECAUSE OF HER KIDNEYS AND LIVER  . Inderal [Propranolol] Cough  . Lisinopril Other (See Comments)    WAS TOLD TO NOT TAKE THIS BECAUSE OF HER KIDNEYS AND LIVER  . Lovastatin Other (See Comments)    WAS TOLD TO NOT TAKE THIS BECAUSE OF HER KIDNEYS AND LIVER  . Norvasc [Amlodipine Besylate] Other (See Comments)    Severe edema  . Nsaids Other (See Comments)    WAS TOLD TO  NOT TAKE THIS BECAUSE OF HER KIDNEYS AND LIVER  . Sulfa Antibiotics Nausea Only    Past Surgical History:  Procedure Laterality Date  . ABDOMINAL HYSTERECTOMY    . BACK SURGERY    . CESAREAN SECTION    . CHOLECYSTECTOMY    . HERNIA REPAIR    . JOINT REPLACEMENT    . KNEE SURGERY    . SPINE SURGERY    . TOE AMPUTATION    . TOE AMPUTATION Bilateral    3 on right foot 2 left foot   . TONSILLECTOMY    . TUBAL LIGATION      Social History   Socioeconomic History  . Marital status: Divorced    Spouse name: Not on file  . Number of children: Not on file  . Years of education: Not on file  . Highest education level: Not on file  Social Needs  . Financial resource strain: Not on file  . Food insecurity - worry: Not on file  . Food insecurity - inability: Not on file  . Transportation needs - medical: Not on file  . Transportation needs - non-medical: Not on file    Occupational History  . Not on file  Tobacco Use  . Smoking status: Never Smoker  . Smokeless tobacco: Never Used  Substance and Sexual Activity  . Alcohol use: No  . Drug use: No  . Sexual activity: No  Other Topics Concern  . Not on file  Social History Narrative   ** Merged History Encounter **        Family History  Problem Relation Age of Onset  . Heart disease Father   . Stroke Maternal Grandfather   . Heart disease Paternal Grandfather   . Cancer Mother        Thymoma, metastatic  . Stroke Father   . Multiple sclerosis Sister   . Epilepsy Sister      ROS Review of Systems See HPI Constitution: No fevers or chills No malaise No diaphoresis Skin: No rash or itching Eyes: no blurry vision, no double vision GU: no dysuria or hematuria Neuro: no dizziness or headaches * all others reviewed and negative   Objective: There were no vitals filed for this visit.  Physical Exam  Assessment and Plan There are no diagnoses linked to this encounter.   [name]

## 2017-02-26 ENCOUNTER — Other Ambulatory Visit (INDEPENDENT_AMBULATORY_CARE_PROVIDER_SITE_OTHER): Payer: Self-pay

## 2017-03-01 ENCOUNTER — Ambulatory Visit (INDEPENDENT_AMBULATORY_CARE_PROVIDER_SITE_OTHER): Payer: Medicare HMO | Admitting: Endocrinology

## 2017-03-01 ENCOUNTER — Telehealth (INDEPENDENT_AMBULATORY_CARE_PROVIDER_SITE_OTHER): Payer: Self-pay | Admitting: Orthopedic Surgery

## 2017-03-01 ENCOUNTER — Ambulatory Visit: Payer: Medicare HMO

## 2017-03-01 ENCOUNTER — Ambulatory Visit: Payer: Medicare HMO | Admitting: Family Medicine

## 2017-03-01 ENCOUNTER — Encounter: Payer: Self-pay | Admitting: Endocrinology

## 2017-03-01 VITALS — BP 142/68 | HR 85 | Wt 228.6 lb

## 2017-03-01 DIAGNOSIS — E1122 Type 2 diabetes mellitus with diabetic chronic kidney disease: Secondary | ICD-10-CM

## 2017-03-01 DIAGNOSIS — N183 Chronic kidney disease, stage 3 (moderate): Secondary | ICD-10-CM

## 2017-03-01 DIAGNOSIS — Z794 Long term (current) use of insulin: Secondary | ICD-10-CM | POA: Diagnosis not present

## 2017-03-01 MED ORDER — LANTUS SOLOSTAR 100 UNIT/ML ~~LOC~~ SOPN: 200.0000 [IU] | PEN_INJECTOR | SUBCUTANEOUS | 11 refills | Status: DC

## 2017-03-01 NOTE — Telephone Encounter (Signed)
refaxed orders for home health care

## 2017-03-01 NOTE — Progress Notes (Signed)
Subjective:    Patient ID: [name], female    DOB: 02-15-1955, 62 y.o.   MRN: [id number]  HPI pt is referred by Dr Nolon Rod, for diabetes.  Pt states DM was dx'ed in 1997; she has severe polyneuropathy of the lower extremities; she has associated chronic renal failure, NPDR, CAD, PAD, TIA, toe amputations, and foot ulcer; she has been on insulin since 2005; pt says her diet is fair, and exercise is limited by health problems; he has never had GDM, pancreatitis, pancreatic surgery, severe hypoglycemia or DKA.  She says cbg are seldom below 200.  She takes lantus 80 units BID, novolog 10 units 3 times a day (just before each meal), and prn novolog (averages a total of approx  20 units/day).  She says cbg's vary from 163-425.  There is no trend throughout the day.  She also takes byetta.   Past Medical History:  Diagnosis Date  . Allergy   . Arthritis   . Chronic kidney disease   . Chronic pain   . Coronary artery disease   . Diabetes mellitus without complication ([place])   . Dyspnea   . GERD (gastroesophageal reflux disease)   . Heart murmur   . Hypertension   . Liver disease   . NASH (nonalcoholic steatohepatitis) 09/16/2016  . Obstructive sleep apnea   . Osteomyelitis ([employer])   . Peripheral vascular disease ([place])   . Sleep apnea   . Stroke ([place])   . Ulcers of both great toes ([place]) 11/07/2016   right great toe, started as finger nail size and grew in 6 days     Past Surgical History:  Procedure Laterality Date  . ABDOMINAL HYSTERECTOMY    . BACK SURGERY    . CESAREAN SECTION    . CHOLECYSTECTOMY    . HERNIA REPAIR    . JOINT REPLACEMENT    . KNEE SURGERY    . SPINE SURGERY    . TOE AMPUTATION    . TOE AMPUTATION Bilateral    3 on right foot 2 left foot   . TONSILLECTOMY    . TUBAL LIGATION      Social History   Socioeconomic History  . Marital status: Divorced    Spouse name: Not on file  . Number of children: Not on file  . Years of education: Not on file    . Highest education level: Not on file  Social Needs  . Financial resource strain: Not on file  . Food insecurity - worry: Not on file  . Food insecurity - inability: Not on file  . Transportation needs - medical: Not on file  . Transportation needs - non-medical: Not on file  Occupational History  . Not on file  Tobacco Use  . Smoking status: Never Smoker  . Smokeless tobacco: Never Used  Substance and Sexual Activity  . Alcohol use: No  . Drug use: No  . Sexual activity: No  Other Topics Concern  . Not on file  Social History Narrative   ** Merged History Encounter **        Current Outpatient Medications on File Prior to Visit  Medication Sig Dispense Refill  . atorvastatin (LIPITOR) 80 MG tablet Take 1 tablet (80 mg total) by mouth daily at 6 PM. 90 tablet 3  . busPIRone (BUSPAR) 15 MG tablet Take 1 tablet (15 mg total) by mouth 3 (three) times daily. 90 tablet 3  . carvedilol (COREG) 25 MG tablet Take 1 tablet (25 mg  total) by mouth 2 (two) times daily. 180 tablet 1  . clopidogrel (PLAVIX) 75 MG tablet Take 1 tablet (75 mg total) by mouth daily. 90 tablet 3  . exenatide (BYETTA 5 MCG PEN) 5 MCG/0.02ML SOPN injection Inject 0.02 mLs (5 mcg total) into the skin 2 (two) times daily with a meal. 1.2 mL 3  . ezetimibe (ZETIA) 10 MG tablet Take 1 tablet (10 mg total) by mouth daily. 90 tablet 3  . gabapentin (NEURONTIN) 300 MG capsule Take 1 capsule (300 mg total) by mouth 3 (three) times daily. 90 capsule 3  . hydrochlorothiazide (HYDRODIURIL) 25 MG tablet Take 1 tablet (25 mg total) by mouth daily. 90 tablet 1  . insulin aspart (NOVOLOG) 100 UNIT/ML FlexPen Inject 10 Units into the skin 3 (three) times daily before meals. "PER A SLIDING SCALE OF AN ADDITIONAL 2-10 UNITS" 15 mL 3  . sertraline (ZOLOFT) 50 MG tablet Take 1 tablet (50 mg total) by mouth daily. 90 tablet 1  . tapentadol (NUCYNTA) 50 MG tablet Take 1 tablet (50 mg total) by mouth 3 (three) times daily. 90 tablet 0   . tiZANidine (ZANAFLEX) 4 MG tablet Take 4 mg by mouth 3 (three) times daily.     No current facility-administered medications on file prior to visit.     Allergies  Allergen Reactions  . Lasix [Furosemide]     Dehydration leading to kidney failure   . Norvasc [Amlodipine Besylate]     Severe edema  . Prednisone Other (See Comments)    Ketoacidosis  . Vancomycin Other (See Comments)    Total organ shut down  . Doxycycline Hives  . Ibuprofen Other (See Comments)    WAS TOLD TO NOT TAKE THIS BECAUSE OF HER KIDNEYS AND LIVER  . Inderal [Propranolol] Cough  . Lisinopril Other (See Comments)    WAS TOLD TO NOT TAKE THIS BECAUSE OF HER KIDNEYS AND LIVER  . Lovastatin Other (See Comments)    WAS TOLD TO NOT TAKE THIS BECAUSE OF HER KIDNEYS AND LIVER  . Norvasc [Amlodipine Besylate] Other (See Comments)    Severe edema  . Nsaids Other (See Comments)    WAS TOLD TO NOT TAKE THIS BECAUSE OF HER KIDNEYS AND LIVER  . Sulfa Antibiotics Nausea Only    Family History  Problem Relation Age of Onset  . Cancer Mother        Thymoma, metastatic  . Heart disease Father   . Stroke Father   . Stroke Maternal Grandfather   . Heart disease Paternal Grandfather   . Multiple sclerosis Sister   . Epilepsy Sister   . Diabetes Neg Hx     BP (!) 142/68 (BP Location: Left Arm, Patient Position: Sitting, Cuff Size: Normal)   Pulse 85   Wt 228 lb 9.6 oz (103.7 kg)   SpO2 94%   BMI 36.90 kg/m    Review of Systems denies blurry vision, sob, n/v, excessive diaphoresis, memory loss, cold intolerance, rhinorrhea, and easy bruising.  She has lost 30 lbs x 5 months.  She has chronic headache, leg cramps, and nocturia.  Depression is well-controlled.     Objective:   Physical Exam VS: see vs page GEN: no distress HEAD: head: no deformity eyes: no periorbital swelling, no proptosis external nose and ears are normal mouth: no lesion seen NECK: supple, thyroid is not enlarged CHEST WALL: no  deformity LUNGS: clear to auscultation CV: reg rate and rhythm, no murmur ABD: abdomen is soft, nontender.  no hepatosplenomegaly.  not distended.  Large self-reducing ventral hernia MUSCULOSKELETAL: muscle bulk and strength are grossly normal.  no obvious joint swelling.  gait is steady, with a cane EXTEMITIES: absent are the left 2nd and 1-3 right toes.  The left great and 3rd toes are partially amputated.  ulcer on the right foot is bandaged (sees wound care).  feet are of normal color and temp.  Trace bilat leg edema PULSES: dorsalis pedis intact bilat.  no carotid bruit NEURO:  cn 2-12 grossly intact.   readily moves all 4's.  sensation is absent to touch on the feet.  SKIN:  Normal texture and temperature.  No rash or suspicious lesion is visible.   NODES:  None palpable at the neck.   PSYCH: alert, well-oriented.  Does not appear anxious nor depressed.   Lab Results  Component Value Date   HGBA1C 12.4 (H) 01/02/2017   Lab Results  Component Value Date   CREATININE 1.43 (H) 01/25/2017   BUN 30 (H) 01/25/2017   NA 135 01/25/2017   K 3.8 01/25/2017   CL 98 01/25/2017   CO2 21 01/25/2017   I personally reviewed electrocardiogram tracing (10/29/16): Indication: chest pain Impression: NSR with 1D AVB.  No MI.  Bifascicular block Compared to 09/30/16: no significant change.   I have reviewed outside records, and summarized: Pt was noted to have severely elevated a1c, and referred here.  Other probs were addressed also, including HTN and anxiety      Assessment & Plan:  Insulin-requiring type 2 DM, with renal failure: severe exacerbation.    Patient Instructions  good diet and exercise significantly improve the control of your diabetes.  please let me know if you wish to be referred to a dietician.  high blood sugar is very risky to your health.  you should see an eye doctor and dentist every year.  It is very important to get all recommended vaccinations.  Controlling your  blood pressure and cholesterol drastically reduces the damage diabetes does to your body.  Those who smoke should quit.  Please discuss these with your doctor.  check your blood sugar 3 times a day.  vary the time of day when you check, between before the 3 meals, and at bedtime.  also check if you have symptoms of your blood sugar being too high or too low.  please keep a record of the readings and bring it to your next appointment here (or you can bring the meter itself).  You can write it on any piece of paper.  please call us sooner if your blood sugar goes below 70, or if you have a lot of readings over 200.  We will need to take this complex situation in stages.   for now, please: Increase lantus to 200 units daily (all in the morning), and:  Please continue the same novolog.  Please call or message [place] in a few days, to tell us how the blood sugar is doing.   Please come back for a follow-up appointment in 2 months.

## 2017-03-01 NOTE — Patient Instructions (Addendum)
good diet and exercise significantly improve the control of your diabetes.  please let me know if you wish to be referred to a dietician.  high blood sugar is very risky to your health.  you should see an eye doctor and dentist every year.  It is very important to get all recommended vaccinations.  Controlling your blood pressure and cholesterol drastically reduces the damage diabetes does to your body.  Those who smoke should quit.  Please discuss these with your doctor.  check your blood sugar 3 times a day.  vary the time of day when you check, between before the 3 meals, and at bedtime.  also check if you have symptoms of your blood sugar being too high or too low.  please keep a record of the readings and bring it to your next appointment here (or you can bring the meter itself).  You can write it on any piece of paper.  please call us sooner if your blood sugar goes below 70, or if you have a lot of readings over 200.  We will need to take this complex situation in stages.   for now, please: Increase lantus to 200 units daily (all in the morning), and:  Please continue the same novolog.  Please call or message [place] in a few days, to tell us how the blood sugar is doing.   Please come back for a follow-up appointment in 2 months.

## 2017-03-01 NOTE — Telephone Encounter (Signed)
[name] with [employer] called advised she only received the face sheet of the fax. Jamie advised that the order can be called in. The number to contact [name] is [phone]

## 2017-03-02 ENCOUNTER — Telehealth (INDEPENDENT_AMBULATORY_CARE_PROVIDER_SITE_OTHER): Payer: Self-pay | Admitting: Orthopedic Surgery

## 2017-03-02 ENCOUNTER — Telehealth: Payer: Self-pay | Admitting: Family Medicine

## 2017-03-02 NOTE — Telephone Encounter (Signed)
Patient's daughter [name]) called asked if the order was put in for [facility] to come out to dress patient's feet and monitor patient's blood sugar.  [name] advised patient is hurting in her legs and ankles. She advised patient is having a lot of cramping and spasms. The number to contact patient is [phone]

## 2017-03-02 NOTE — Telephone Encounter (Signed)
Copied from [place] [id number]. Topic: Inquiry >> Mar 02, 2017  3:54 PM [name] I, NT wrote: Reason for CRM: pt called and state she need home care for help for personal needs

## 2017-03-03 NOTE — Telephone Encounter (Signed)
Please advise 

## 2017-03-03 NOTE — Telephone Encounter (Signed)
I called and lm on vm for [facility] to advise that orders had been faxed on 02/26/17 and again yesterday. I gave verbal orders and advised to call with questions. I also called and lm on vm for pt to advise that this had been done and that she can try 4 oz of coconut water daily. She can mix this with a small amount orange juice makes it a little more palatable. To call with questions.

## 2017-03-05 ENCOUNTER — Other Ambulatory Visit: Payer: Self-pay | Admitting: *Deleted

## 2017-03-08 ENCOUNTER — Ambulatory Visit: Payer: Medicare HMO | Admitting: Family Medicine

## 2017-03-08 ENCOUNTER — Other Ambulatory Visit: Payer: Self-pay

## 2017-03-08 ENCOUNTER — Encounter: Payer: Self-pay | Admitting: Cardiology

## 2017-03-08 ENCOUNTER — Ambulatory Visit (INDEPENDENT_AMBULATORY_CARE_PROVIDER_SITE_OTHER): Payer: Medicare HMO | Admitting: Family

## 2017-03-08 ENCOUNTER — Ambulatory Visit (INDEPENDENT_AMBULATORY_CARE_PROVIDER_SITE_OTHER): Payer: Medicare HMO | Admitting: Cardiology

## 2017-03-08 ENCOUNTER — Encounter (HOSPITAL_BASED_OUTPATIENT_CLINIC_OR_DEPARTMENT_OTHER): Payer: Self-pay | Admitting: Emergency Medicine

## 2017-03-08 ENCOUNTER — Emergency Department (HOSPITAL_BASED_OUTPATIENT_CLINIC_OR_DEPARTMENT_OTHER): Payer: Medicare HMO

## 2017-03-08 ENCOUNTER — Emergency Department (HOSPITAL_BASED_OUTPATIENT_CLINIC_OR_DEPARTMENT_OTHER)
Admission: EM | Admit: 2017-03-08 | Discharge: 2017-03-08 | Disposition: A | Discharge status: Home / Self Care | Payer: Medicare HMO | Attending: Emergency Medicine | Admitting: Emergency Medicine

## 2017-03-08 VITALS — HR 87 | Ht 66.0 in | Wt 227.0 lb

## 2017-03-08 DIAGNOSIS — L97511 Non-pressure chronic ulcer of other part of right foot limited to breakdown of skin: Secondary | ICD-10-CM | POA: Diagnosis not present

## 2017-03-08 DIAGNOSIS — M79661 Pain in right lower leg: Secondary | ICD-10-CM | POA: Insufficient documentation

## 2017-03-08 DIAGNOSIS — Z79899 Other long term (current) drug therapy: Secondary | ICD-10-CM | POA: Insufficient documentation

## 2017-03-08 DIAGNOSIS — Z9989 Dependence on other enabling machines and devices: Secondary | ICD-10-CM | POA: Diagnosis not present

## 2017-03-08 DIAGNOSIS — M79604 Pain in right leg: Secondary | ICD-10-CM

## 2017-03-08 DIAGNOSIS — R072 Precordial pain: Secondary | ICD-10-CM

## 2017-03-08 DIAGNOSIS — E782 Mixed hyperlipidemia: Secondary | ICD-10-CM

## 2017-03-08 DIAGNOSIS — G4733 Obstructive sleep apnea (adult) (pediatric): Secondary | ICD-10-CM

## 2017-03-08 DIAGNOSIS — E1165 Type 2 diabetes mellitus with hyperglycemia: Secondary | ICD-10-CM

## 2017-03-08 DIAGNOSIS — I700 Atherosclerosis of aorta: Secondary | ICD-10-CM

## 2017-03-08 DIAGNOSIS — I2510 Atherosclerotic heart disease of native coronary artery without angina pectoris: Secondary | ICD-10-CM | POA: Diagnosis not present

## 2017-03-08 DIAGNOSIS — E1140 Type 2 diabetes mellitus with diabetic neuropathy, unspecified: Secondary | ICD-10-CM | POA: Diagnosis not present

## 2017-03-08 DIAGNOSIS — I129 Hypertensive chronic kidney disease with stage 1 through stage 4 chronic kidney disease, or unspecified chronic kidney disease: Secondary | ICD-10-CM | POA: Diagnosis not present

## 2017-03-08 DIAGNOSIS — Z794 Long term (current) use of insulin: Secondary | ICD-10-CM | POA: Insufficient documentation

## 2017-03-08 DIAGNOSIS — I10 Essential (primary) hypertension: Secondary | ICD-10-CM | POA: Diagnosis not present

## 2017-03-08 DIAGNOSIS — M25561 Pain in right knee: Secondary | ICD-10-CM | POA: Insufficient documentation

## 2017-03-08 DIAGNOSIS — I959 Hypotension, unspecified: Secondary | ICD-10-CM | POA: Insufficient documentation

## 2017-03-08 DIAGNOSIS — N183 Chronic kidney disease, stage 3 (moderate): Secondary | ICD-10-CM | POA: Diagnosis not present

## 2017-03-08 DIAGNOSIS — E1122 Type 2 diabetes mellitus with diabetic chronic kidney disease: Secondary | ICD-10-CM | POA: Insufficient documentation

## 2017-03-08 DIAGNOSIS — E876 Hypokalemia: Secondary | ICD-10-CM | POA: Diagnosis not present

## 2017-03-08 DIAGNOSIS — G459 Transient cerebral ischemic attack, unspecified: Secondary | ICD-10-CM

## 2017-03-08 DIAGNOSIS — G894 Chronic pain syndrome: Secondary | ICD-10-CM | POA: Diagnosis not present

## 2017-03-08 DIAGNOSIS — Z7902 Long term (current) use of antithrombotics/antiplatelets: Secondary | ICD-10-CM | POA: Insufficient documentation

## 2017-03-08 DIAGNOSIS — I209 Angina pectoris, unspecified: Secondary | ICD-10-CM | POA: Insufficient documentation

## 2017-03-08 DIAGNOSIS — R52 Pain, unspecified: Secondary | ICD-10-CM

## 2017-03-08 LAB — I-STAT CHEM 8, ED
BUN: 28 mg/dL — ABNORMAL HIGH (ref 6–20)
CREATININE: 1.4 mg/dL — AB (ref 0.44–1.00)
Calcium, Ion: 1.22 mmol/L (ref 1.15–1.40)
Chloride: 102 mmol/L (ref 101–111)
GLUCOSE: 142 mg/dL — AB (ref 65–99)
HEMATOCRIT: 41 % (ref 36.0–46.0)
HEMOGLOBIN: 13.9 g/dL (ref 12.0–15.0)
POTASSIUM: 3.3 mmol/L — AB (ref 3.5–5.1)
Sodium: 141 mmol/L (ref 135–145)
TCO2: 26 mmol/L (ref 22–32)

## 2017-03-08 LAB — CBC WITH DIFFERENTIAL/PLATELET
Basophils Absolute: 0 10*3/uL (ref 0.0–0.1)
Basophils Relative: 0 %
EOS ABS: 0.1 10*3/uL (ref 0.0–0.7)
Eosinophils Relative: 1 %
HCT: 38.9 % (ref 36.0–46.0)
HEMOGLOBIN: 13.1 g/dL (ref 12.0–15.0)
LYMPHS ABS: 0.9 10*3/uL (ref 0.7–4.0)
LYMPHS PCT: 12 %
MCH: 28 pg (ref 26.0–34.0)
MCHC: 33.7 g/dL (ref 30.0–36.0)
MCV: 83.1 fL (ref 78.0–100.0)
Monocytes Absolute: 0.7 10*3/uL (ref 0.1–1.0)
Monocytes Relative: 10 %
NEUTROS ABS: 5.3 10*3/uL (ref 1.7–7.7)
NEUTROS PCT: 77 %
Platelets: 194 10*3/uL (ref 150–400)
RBC: 4.68 MIL/uL (ref 3.87–5.11)
RDW: 13.7 % (ref 11.5–15.5)
WBC: 7 10*3/uL (ref 4.0–10.5)

## 2017-03-08 LAB — URINALYSIS, ROUTINE W REFLEX MICROSCOPIC
Bilirubin Urine: NEGATIVE
Glucose, UA: 100 mg/dL — AB
Hgb urine dipstick: NEGATIVE
Ketones, ur: NEGATIVE mg/dL
Leukocytes, UA: NEGATIVE
NITRITE: NEGATIVE
Protein, ur: 30 mg/dL — AB
SPECIFIC GRAVITY, URINE: 1.02 (ref 1.005–1.030)
pH: 6 (ref 5.0–8.0)

## 2017-03-08 LAB — URINALYSIS, MICROSCOPIC (REFLEX)

## 2017-03-08 MED ORDER — SODIUM CHLORIDE 0.9 % IV BOLUS (SEPSIS)
1000.0000 mL | Freq: Once | INTRAVENOUS | Status: AC
  Administered 2017-03-08: 1000 mL via INTRAVENOUS

## 2017-03-08 MED ORDER — POTASSIUM CHLORIDE CRYS ER 20 MEQ PO TBCR
40.0000 meq | EXTENDED_RELEASE_TABLET | Freq: Once | ORAL | Status: AC
  Administered 2017-03-08: 40 meq via ORAL
  Filled 2017-03-08: qty 2

## 2017-03-08 NOTE — Discharge Instructions (Signed)
As discussed, discontinue your hydrochlorothiazide and follow-up with your primary care provider to further discuss blood pressure management. Drink plenty of fluids to keep yourself hydrated. Return if symptoms worsen or new concerning symptoms in the meantime.

## 2017-03-08 NOTE — ED Notes (Signed)
Patient transported to X-ray 

## 2017-03-08 NOTE — ED Notes (Addendum)
Given meal tray and diet soda

## 2017-03-08 NOTE — ED Notes (Signed)
Pt on monitor 

## 2017-03-08 NOTE — ED Notes (Signed)
Pt at xray will get orthstatics when she returns

## 2017-03-08 NOTE — Progress Notes (Signed)
Cardiology Consultation:    Date:  03/08/2017   ID:  [name], DOB [birth year], MRN [id number]  PCP:  Forrest Moron, MD  Cardiologist:  Jenne Campus, MD   Referring MD: Forrest Moron, MD   Chief Complaint  Patient presents with  . Chest Pain  I have a chest pain  History of Present Illness:    [name] is a 62 y.o. female who is being seen today for the evaluation of multiple medical problems and chest pain at the request of Delia Chimes A, MD.  She does have poorly controlled diabetes since 1997 since 2005 she is on insulin.  She does have history of CVA and TIAs, history of essential hypertension.  Also chronic kidney dysfunction.  She presented to our office for consultation to be established as a patient as well as for chest pain.  She described pain as tightness squeezing pressure with radiation towards the neck as well as towards the arm.  She tells me that she had multiple stress tests last one about a year ago which apparently was negative also got history of cardiac catheterization done about 7 years ago at [facility] which apparently was normal.  Pain that she described to me is not related to exercise can last up to hours there is no shortness of breath no sweating associated with this sensation.  Her ability to exercise severely limited because of chronic leg pain related to neuropathy as well as multiple toe amputations.  Past Medical History:  Diagnosis Date  . Allergy   . Arthritis   . Chronic kidney disease   . Chronic pain   . Coronary artery disease   . Diabetes mellitus without complication ([place])   . Dyspnea   . GERD (gastroesophageal reflux disease)   . Heart murmur   . Hypertension   . Liver disease   . NASH (nonalcoholic steatohepatitis) 09/16/2016  . Obstructive sleep apnea   . Osteomyelitis ([place])   . Peripheral vascular disease ([place])   . Sleep apnea   . Stroke ([place])   . Ulcers of both great toes ([place]) 11/07/2016   right great toe,  started as finger nail size and grew in 6 days     Past Surgical History:  Procedure Laterality Date  . ABDOMINAL HYSTERECTOMY    . BACK SURGERY    . CESAREAN SECTION    . CHOLECYSTECTOMY    . HERNIA REPAIR    . JOINT REPLACEMENT    . KNEE SURGERY    . SPINE SURGERY    . TOE AMPUTATION    . TOE AMPUTATION Bilateral    3 on right foot 2 left foot   . TONSILLECTOMY    . TUBAL LIGATION      Current Medications: Current Meds  Medication Sig  . atorvastatin (LIPITOR) 80 MG tablet Take 1 tablet (80 mg total) by mouth daily at 6 PM.  . BD ULTRA-FINE PEN NEEDLES 29G X 12.7MM MISC   . busPIRone (BUSPAR) 15 MG tablet Take 1 tablet (15 mg total) by mouth 3 (three) times daily.  . carvedilol (COREG) 25 MG tablet Take 1 tablet (25 mg total) by mouth 2 (two) times daily.  . clopidogrel (PLAVIX) 75 MG tablet Take 1 tablet (75 mg total) by mouth daily.  Marland Kitchen exenatide (BYETTA 5 MCG PEN) 5 MCG/0.02ML SOPN injection Inject 0.02 mLs (5 mcg total) into the skin 2 (two) times daily with a meal.  . ezetimibe (ZETIA) 10 MG tablet Take 1  tablet (10 mg total) by mouth daily.  Marland Kitchen gabapentin (NEURONTIN) 300 MG capsule Take 1 capsule (300 mg total) by mouth 3 (three) times daily.  . hydrochlorothiazide (HYDRODIURIL) 25 MG tablet Take 1 tablet (25 mg total) by mouth daily.  . insulin aspart (NOVOLOG) 100 UNIT/ML FlexPen Inject 10 Units into the skin 3 (three) times daily before meals. "PER A SLIDING SCALE OF AN ADDITIONAL 2-10 UNITS"  . LANTUS SOLOSTAR 100 UNIT/ML Solostar Pen Inject 200 Units into the skin every morning.  . sertraline (ZOLOFT) 50 MG tablet Take 1 tablet (50 mg total) by mouth daily.  . tapentadol (NUCYNTA) 50 MG tablet Take 1 tablet (50 mg total) by mouth 3 (three) times daily.  Marland Kitchen tiZANidine (ZANAFLEX) 4 MG tablet Take 4 mg by mouth 3 (three) times daily.     Allergies:   Lasix [furosemide]; Norvasc [amlodipine besylate]; Prednisone; Vancomycin; Doxycycline; Ibuprofen; Inderal [propranolol];  Lisinopril; Lovastatin; Norvasc [amlodipine besylate]; Nsaids; and Sulfa antibiotics   Social History   Socioeconomic History  . Marital status: Divorced    Spouse name: None  . Number of children: None  . Years of education: None  . Highest education level: None  Social Needs  . Financial resource strain: None  . Food insecurity - worry: None  . Food insecurity - inability: None  . Transportation needs - medical: None  . Transportation needs - non-medical: None  Occupational History  . None  Tobacco Use  . Smoking status: Never Smoker  . Smokeless tobacco: Never Used  Substance and Sexual Activity  . Alcohol use: No  . Drug use: No  . Sexual activity: No  Other Topics Concern  . None  Social History Narrative   ** Merged History Encounter **         Family History: The patient's family history includes Cancer in her mother; Epilepsy in her sister; Heart disease in her father and paternal grandfather; Multiple sclerosis in her sister; Stroke in her father and maternal grandfather. There is no history of Diabetes. ROS:   Please see the history of present illness.    All 14 point review of systems negative except as described per history of present illness.  EKGs/Labs/Other Studies Reviewed:    The following studies were reviewed today: CT of her neck and chest which showed no critical lesions in her coronary arteries she does have a ascending aortic aneurysm measuring 3.5 cm.  I also reviewed her ABIs which were normal both sides  EKG:  EKG is  ordered today.  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  Recent Labs: 01/02/2017: Hemoglobin 13.1; Platelets 144 01/25/2017: ALT 19; BUN 30; Creatinine, Ser 1.43; Potassium 3.8; Sodium 135  Recent Lipid Panel    Component Value Date/Time   CHOL 245 (H) 01/03/2017 0258   TRIG 603 (H) 01/03/2017 0258   HDL 21 (L) 01/03/2017  0258   CHOLHDL 11.7 01/03/2017 0258   VLDL UNABLE TO CALCULATE IF TRIGLYCERIDE OVER 400 mg/dL 01/03/2017 0258   LDLCALC UNABLE TO CALCULATE IF TRIGLYCERIDE OVER 400 mg/dL 01/03/2017 0258    Physical Exam:    VS:  Pulse 87   Ht 5' 6"  (1.676 m)   Wt 227 lb (103 kg)   SpO2 98%   BMI 36.64 kg/m     Wt Readings from Last 3 Encounters:  03/08/17 227 lb (103 kg)  03/01/17 228 lb 9.6 oz (103.7 kg)  02/24/17  223 lb 12.8 oz (101.5 kg)     GEN:  Well nourished, well developed in no acute distress HEENT: Normal NECK: No JVD; No carotid bruits LYMPHATICS: No lymphadenopathy CARDIAC: RRR, no murmurs, no rubs, no gallops RESPIRATORY:  Clear to auscultation without rales, wheezing or rhonchi  ABDOMEN: Soft, non-tender, non-distended MUSCULOSKELETAL:  No edema; No deformity  SKIN: Warm and dry NEUROLOGIC:  Alert and oriented x 3 PSYCHIATRIC:  Normal affect   ASSESSMENT:    1. Aortic atherosclerosis ([place])   2. Essential hypertension   3. TIA (transient ischemic attack)   4. OSA on CPAP   5. Uncontrolled type 2 diabetes mellitus with hyperglycemia ([place])   6. Non-pressure chronic ulcer of other part of right foot limited to breakdown of skin ([place])   7. Mixed hyperlipidemia   8. Chronic pain syndrome   9. Precordial pain    PLAN:    In order of problems listed above:  1. Aortic atherosclerosis: Multiple risk factors.  All appears to be managed.  Obviously the biggest issue is her diabetes being very poorly controlled. 2. Essential hypertension.  Today she is hypotensive we will check blood pressure repeatedly her blood pressure is 60 systolic by Doppler.  I will send her to emergency room for evaluation.  I will also cut down her carvedilol to 12.5 twice daily. 3. History of TIA followed by neurology 4. Obstructive sleep apnea: Does not use CPAP mask.  I advised her to reconsider. 5. Poorly controlled diabetes: Managed by internal medicine team.  She is also seeing endocrinologist I  did explain to her that this extremity essential to properly controlled diabetes. 6. His lipidemia will continue with statin. 7. Precordial chest pain: I will try to get her cardiac catheterization as well as recent stress test report I will put her empirically on ranolazine 500 mg twice daily.   Medication Adjustments/Labs and Tests Ordered: Current medicines are reviewed at length with the patient today.  Concerns regarding medicines are outlined above.  No orders of the defined types were placed in this encounter.  No orders of the defined types were placed in this encounter.   Signed, Park Liter, MD, [facility]. 03/08/2017 11:03 AM    [place]

## 2017-03-08 NOTE — ED Triage Notes (Addendum)
Was being eval by Cardiology PTA and BP was low and referred to ED . Will be starting in a new pain clinic on 03/13/17, pain to back and legs

## 2017-03-08 NOTE — ED Provider Notes (Signed)
[place] EMERGENCY DEPARTMENT Provider Note   CSN: [id number] Arrival date & time: 03/08/17  1116     History   Chief Complaint Chief Complaint  Patient presents with  . Hypotension    HPI [name] is a 62 y.o. female with past medical history significant for CKD, CAD, DM, hypertension, [place], PVD, stroke presenting from the cardiologist's office with hypotension.  Patient denies any symptoms from this.  She does report a recent trip and fall 3 days ago which she hurt her right knee and right leg.  No head trauma or loss of consciousness no anticoagulant use. No headache, dizziness, lightheadedness, nausea, vomiting, visual disturbances, fever, chills, chest pain, shortness of breath or other symptoms.  HPI  Past Medical History:  Diagnosis Date  . Allergy   . Arthritis   . Chronic kidney disease   . Chronic pain   . Coronary artery disease   . Diabetes mellitus without complication ([place])   . Dyspnea   . GERD (gastroesophageal reflux disease)   . Heart murmur   . Hypertension   . Liver disease   . NASH (nonalcoholic steatohepatitis) 09/16/2016  . Obstructive sleep apnea   . Osteomyelitis ([place])   . Peripheral vascular disease ([place])   . Sleep apnea   . Stroke ([place])   . Ulcers of both great toes ([place]) 11/07/2016   right great toe, started as finger nail size and grew in 6 days     Patient Active Problem List   Diagnosis Date Noted  . Chest pain 03/08/2017  . Aortic atherosclerosis ([place]) 01/13/2017  . At high risk for falls 01/13/2017  . Uncontrolled type 2 diabetes mellitus with hyperglycemia ([place]) 01/13/2017  . Stroke-like symptoms   . Complicated migraine   . Mixed hyperlipidemia   . Generalized weakness   . TIA (transient ischemic attack) 01/02/2017  . Ulcer of toe of right foot, limited to breakdown of skin ([place]) 12/18/2016  . Non-pressure chronic ulcer of other part of right foot limited to breakdown of skin ([place]) 12/07/2016  . Diabetic  foot ulcer ([place]) 12/04/2016  . Hypovolemia 12/04/2016  . Chronic pain syndrome 12/04/2016  . Diabetic peripheral neuropathy ([place]) 12/04/2016  . Hypotension   . Liver mass 09/20/2016  . Kidney disease, chronic, stage III (GFR 30-59 ml/min) ([facility]) 12/17/2015  . Diabetes mellitus ([place]) 07/18/2015  . Essential hypertension 06/06/2015  . Chronic back pain 07/27/2012  . OSA on CPAP 03/21/2012    Past Surgical History:  Procedure Laterality Date  . ABDOMINAL HYSTERECTOMY    . BACK SURGERY    . CESAREAN SECTION    . CHOLECYSTECTOMY    . HERNIA REPAIR    . JOINT REPLACEMENT    . KNEE SURGERY    . SPINE SURGERY    . TOE AMPUTATION    . TOE AMPUTATION Bilateral    3 on right foot 2 left foot   . TONSILLECTOMY    . TUBAL LIGATION      OB History    No data available       Home Medications    Prior to Admission medications   Medication Sig Start Date End Date Taking? Authorizing Provider  atorvastatin (LIPITOR) 80 MG tablet Take 1 tablet (80 mg total) by mouth daily at 6 PM. 02/08/17   Forrest Moron, MD  BD ULTRA-FINE PEN NEEDLES 29G X 12.7MM MISC  02/22/17   [provider]  busPIRone (BUSPAR) 15 MG tablet Take 1 tablet (15 mg  total) by mouth 3 (three) times daily. 02/08/17   Forrest Moron, MD  carvedilol (COREG) 25 MG tablet Take 1 tablet (25 mg total) by mouth 2 (two) times daily. 11/23/16   Forrest Moron, MD  clopidogrel (PLAVIX) 75 MG tablet Take 1 tablet (75 mg total) by mouth daily. 02/08/17   Forrest Moron, MD  exenatide (BYETTA 5 MCG PEN) 5 MCG/0.02ML SOPN injection Inject 0.02 mLs (5 mcg total) into the skin 2 (two) times daily with a meal. 11/23/16   Stallings, Zoe A, MD  ezetimibe (ZETIA) 10 MG tablet Take 1 tablet (10 mg total) by mouth daily. 02/08/17   Forrest Moron, MD  gabapentin (NEURONTIN) 300 MG capsule Take 1 capsule (300 mg total) by mouth 3 (three) times daily. 12/28/16   Forrest Moron, MD  insulin aspart (NOVOLOG) 100 UNIT/ML FlexPen Inject  10 Units into the skin 3 (three) times daily before meals. "PER A SLIDING SCALE OF AN ADDITIONAL 2-10 UNITS" 01/01/17   Forrest Moron, MD  LANTUS SOLOSTAR 100 UNIT/ML Solostar Pen Inject 200 Units into the skin every morning. 03/01/17   Renato Shin, MD  sertraline (ZOLOFT) 50 MG tablet Take 1 tablet (50 mg total) by mouth daily. 11/23/16   Forrest Moron, MD  tapentadol (NUCYNTA) 50 MG tablet Take 1 tablet (50 mg total) by mouth 3 (three) times daily. 02/08/17 03/10/17  Forrest Moron, MD  tiZANidine (ZANAFLEX) 4 MG tablet Take 4 mg by mouth 3 (three) times daily.    [provider]    Family History Family History  Problem Relation Age of Onset  . Cancer Mother        Thymoma, metastatic  . Heart disease Father   . Stroke Father   . Stroke Maternal Grandfather   . Heart disease Paternal Grandfather   . Multiple sclerosis Sister   . Epilepsy Sister   . Diabetes Neg Hx     Social History Social History   Tobacco Use  . Smoking status: Never Smoker  . Smokeless tobacco: Never Used  Substance Use Topics  . Alcohol use: No  . Drug use: No     Allergies   Lasix [furosemide]; Norvasc [amlodipine besylate]; Prednisone; Vancomycin; Doxycycline; Ibuprofen; Inderal [propranolol]; Lisinopril; Lovastatin; Norvasc [amlodipine besylate]; Nsaids; and Sulfa antibiotics   Review of Systems Review of Systems  Constitutional: Negative for chills, diaphoresis, fatigue and fever.  HENT: Negative for congestion, ear pain and sore throat.   Eyes: Negative for photophobia, pain, redness and visual disturbance.  Respiratory: Negative for cough, choking, chest tightness, shortness of breath, wheezing and stridor.   Cardiovascular: Negative for chest pain and leg swelling.  Gastrointestinal: Negative for abdominal distention, abdominal pain, diarrhea, nausea and vomiting.  Genitourinary: Negative for difficulty urinating, dysuria, flank pain, frequency and hematuria.  Musculoskeletal:  Positive for arthralgias and joint swelling. Negative for gait problem, neck pain and neck stiffness.  Skin: Positive for color change. Negative for pallor, rash and wound.  Neurological: Negative for dizziness, syncope, weakness, light-headedness, numbness and headaches.  Psychiatric/Behavioral: Negative for behavioral problems.     Physical Exam Updated Vital Signs BP 112/60   Pulse 70   Temp 98.1 F (36.7 C) (Oral)   Resp 17   Ht 5' 6"  (1.676 m)   Wt 103 kg (227 lb)   SpO2 100%   BMI 36.64 kg/m   Physical Exam  Constitutional: She appears well-developed and well-nourished. No distress.  Afebrile, well-appearing sitting comfortably in bed  no acute distress.  HENT:  Head: Normocephalic and atraumatic.  Right Ear: External ear normal.  Left Ear: External ear normal.  Mouth/Throat: Oropharynx is clear and moist. No oropharyngeal exudate.  Eyes: Conjunctivae and EOM are normal. Right eye exhibits no discharge. Left eye exhibits no discharge. No scleral icterus.  Neck: Normal range of motion. Neck supple.  Cardiovascular: Normal rate, regular rhythm, normal heart sounds and intact distal pulses.  No murmur heard. Pulmonary/Chest: Effort normal and breath sounds normal. No stridor. No respiratory distress. She has no wheezes. She has no rales.  Abdominal: She exhibits no distension.  Musculoskeletal: Normal range of motion. She exhibits tenderness. She exhibits no edema.  Tenderness to palpation of the right knee with mild ecchymosis and edema without erythema or warmth. Tenderness to palpation of the tibia.  No tenderness to palpation of the lateral or medial malleoli.  No tenderness to palpation of the hips.  Neurological: She is alert. No sensory deficit. She exhibits normal muscle tone.  Skin: Skin is warm and dry. No rash noted. She is not diaphoretic. No erythema. No pallor.  Psychiatric: She has a normal mood and affect. Her behavior is normal.  Nursing note and vitals  reviewed.    ED Treatments / Results  Labs (all labs ordered are listed, but only abnormal results are displayed) Labs Reviewed  URINALYSIS, ROUTINE W REFLEX MICROSCOPIC - Abnormal; Notable for the following components:      Result Value   Glucose, UA 100 (*)    Protein, ur 30 (*)    All other components within normal limits  URINALYSIS, MICROSCOPIC (REFLEX) - Abnormal; Notable for the following components:   Bacteria, UA FEW (*)    Squamous Epithelial / LPF 0-5 (*)    All other components within normal limits  I-STAT CHEM 8, ED - Abnormal; Notable for the following components:   Potassium 3.3 (*)    BUN 28 (*)    Creatinine, Ser 1.40 (*)    Glucose, Bld 142 (*)    All other components within normal limits  CBC WITH DIFFERENTIAL/PLATELET    EKG  EKG Interpretation  Date/Time:  Monday March 08 2017 11:37:02 EST Ventricular Rate:  76 PR Interval:    QRS Duration: 141 QT Interval:  434 QTC Calculation: 488 R Axis:   -74 Text Interpretation:  Sinus rhythm Prolonged PR interval RBBB and LAFB No significant change was found Confirmed by Jola Schmidt [phone]) on 03/08/2017 3:13:12 PM       Radiology Dg Tibia/fibula Right  Result Date: 03/08/2017 CLINICAL DATA:  Right knee and lower leg pain pain after fall 2 days ago. EXAM: RIGHT KNEE - COMPLETE 4+ VIEW; RIGHT TIBIA AND FIBULA - 2 VIEW COMPARISON:  Right foot x-rays dated December 11, 2016. FINDINGS: No acute fracture or malalignment. Prior right total knee arthroplasty without evidence of hardware complication. No knee joint effusion. Evidence of old trauma along the medial malleolus. Chronic posterior calcaneal avulsion fracture is unchanged. Soft tissues are unremarkable. IMPRESSION: 1.  No acute osseous abnormality. 2. Prior right total knee arthroplasty without evidence of hardware complication. 3. Chronic posterior calcaneal avulsion fracture, unchanged. Electronically Signed   By: Titus Dubin M.D.   On: 03/08/2017 12:35    Dg Knee Complete 4 Views Right  Result Date: 03/08/2017 CLINICAL DATA:  Right knee and lower leg pain pain after fall 2 days ago. EXAM: RIGHT KNEE - COMPLETE 4+ VIEW; RIGHT TIBIA AND FIBULA - 2 VIEW COMPARISON:  Right foot x-rays  dated December 11, 2016. FINDINGS: No acute fracture or malalignment. Prior right total knee arthroplasty without evidence of hardware complication. No knee joint effusion. Evidence of old trauma along the medial malleolus. Chronic posterior calcaneal avulsion fracture is unchanged. Soft tissues are unremarkable. IMPRESSION: 1.  No acute osseous abnormality. 2. Prior right total knee arthroplasty without evidence of hardware complication. 3. Chronic posterior calcaneal avulsion fracture, unchanged. Electronically Signed   By: Titus Dubin M.D.   On: 03/08/2017 12:35   Dg Hip Unilat With Pelvis 2-3 Views Right  Result Date: 03/08/2017 CLINICAL DATA:  Right hip pain after fall 2 days ago. EXAM: DG HIP (WITH OR WITHOUT PELVIS) 2-3V RIGHT COMPARISON:  CT abdomen pelvis dated September 20, 2016. FINDINGS: No acute fracture or malalignment. Degenerative changes of the lower lumbar spine and bilateral sacroiliac joints. Hip joint spaces are preserved. Bone mineralization is normal. Soft tissues are unremarkable. IMPRESSION: No acute osseous abnormality. Electronically Signed   By: Titus Dubin M.D.   On: 03/08/2017 12:36    Procedures Procedures (including critical care time)  Medications Ordered in ED Medications  sodium chloride 0.9 % bolus 1,000 mL (0 mLs Intravenous Stopped 03/08/17 1408)  potassium chloride SA (K-DUR,KLOR-CON) CR tablet 40 mEq (40 mEq Oral Given 03/08/17 1228)     Initial Impression / Assessment and Plan / ED Course  I have reviewed the triage vital signs and the nursing notes.  Pertinent labs & imaging results that were available during my care of the patient were reviewed by me and considered in my medical decision making (see chart for details).       Well-appearing 62 year old female presenting from cardiology's office with asymptomatic hypotension. Found to be slightly hypokalemic Given potassium while in the emergency department. Labs otherwise near baseline. Patient had also mentioned a trip and fall a few days ago on her right knee with knee pain and anterior lower leg pain.  No hip pain or tenderness on palpation. Ordered imaging, no acute findings.  Will give IV fluids and observe. On reassessment, patient reported significant improvement and was ready to go home.  Patient ambulated in the hall without difficulties or symptoms.  She reported significant improvement after fluids.  Will have patient hold HCTZ and follow-up with her primary care provider. Patient was discussed with Dr. Venora Maples who has seen patient and agrees with assessment and plan.  Discharge home with close PCP follow-up for further management of antihypertensives.  She is to discontinue HCTZ at this time.  Discussed strict return precautions and advised to return to the emergency department if experiencing any new or worsening symptoms. Instructions were understood and patient agreed with discharge plan. Final Clinical Impressions(s) / ED Diagnoses   Final diagnoses:  Hypotension, unspecified hypotension type  Hypokalemia    ED Discharge Orders    None       Dossie Der 03/08/17 1713    Jola Schmidt, MD 03/09/17 1520

## 2017-03-09 ENCOUNTER — Telehealth: Payer: Self-pay | Admitting: Cardiology

## 2017-03-09 MED ORDER — RANOLAZINE ER 500 MG PO TB12: 500.0000 mg | ORAL_TABLET | Freq: Two times a day (BID) | ORAL | 6 refills | Status: DC

## 2017-03-09 NOTE — Telephone Encounter (Signed)
[name] needs her [name] [facility] please

## 2017-03-09 NOTE — Telephone Encounter (Signed)
Sent!

## 2017-03-18 ENCOUNTER — Encounter (INDEPENDENT_AMBULATORY_CARE_PROVIDER_SITE_OTHER): Payer: Self-pay | Admitting: Orthopedic Surgery

## 2017-03-18 ENCOUNTER — Ambulatory Visit (INDEPENDENT_AMBULATORY_CARE_PROVIDER_SITE_OTHER): Payer: Medicare HMO | Admitting: Orthopedic Surgery

## 2017-03-18 VITALS — Ht 66.0 in | Wt 227.0 lb

## 2017-03-18 DIAGNOSIS — E1165 Type 2 diabetes mellitus with hyperglycemia: Secondary | ICD-10-CM | POA: Diagnosis not present

## 2017-03-18 DIAGNOSIS — L97511 Non-pressure chronic ulcer of other part of right foot limited to breakdown of skin: Secondary | ICD-10-CM | POA: Diagnosis not present

## 2017-03-18 DIAGNOSIS — Z794 Long term (current) use of insulin: Secondary | ICD-10-CM

## 2017-03-18 DIAGNOSIS — IMO0002 Reserved for concepts with insufficient information to code with codable children: Secondary | ICD-10-CM

## 2017-03-18 DIAGNOSIS — E1142 Type 2 diabetes mellitus with diabetic polyneuropathy: Secondary | ICD-10-CM | POA: Diagnosis not present

## 2017-03-18 NOTE — Progress Notes (Signed)
Office Visit Note   Patient: [name]           Date of Birth: 1955/07/11           MRN: [id number] Visit Date: 03/18/2017              Requested by: Forrest Moron, MD [place], [place] [id number] PCP: Forrest Moron, MD  Chief Complaint  Patient presents with  . Right Foot - Wound Check      HPI: Patient is a 62 year old woman status post multiple toe amputations who presents for evaluation for a Waggoner grade 1 ulcer right first metatarsal head.  Patient states she has been doing dressing changes daily she has been using a Darco shoe with a felt relieving donut.  She states that her fasting blood sugar this morning was 225.  She states her hemoglobin A1c runs around 12.  Assessment & Plan: Visit Diagnoses:  1. 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 Uncontrolled type 2 diabetes mellitus with diabetic polyneuropathy, with long-term current use of insulin ([place])     Plan: Continue with minimizing weightbearing continue with dressing changes discussed proper diet discussed the importance of getting her hemoglobin A1c below 7.5.  Patient states that her hemoglobin A1c has never been below 9.  Follow-Up Instructions: Return in about 3 weeks (around 04/08/2017).   Ortho Exam  Patient is alert, oriented, no adenopathy, well-dressed, normal affect, normal respiratory effort. Examination patient has a good dorsalis pedis pulse there is no a sending cellulitis there is no odor no drainage.  She has a large Waggoner grade 1 ulcer right first metatarsal head.  After informed consent a 10 blade knife was used to debride the skin and soft tissue back to healthy viable granulation tissue.  Silver nitrate was used for hemostasis Iodosorb and 4 x 4 and Coban was applied to the wound.  Wound measures 20 x 40 mm and there is 2 mm deep.  There is good granulation tissue the wound bed there is no exposed bone or  tendon.  Imaging: No results found. No images are attached to the encounter.  Labs: Lab Results  Component Value Date   HGBA1C 12.4 (H) 01/02/2017   HGBA1C 12.7 11/23/2016   HGBA1C 12.2 (H) 09/21/2016   ESRSEDRATE 39 (H) 12/05/2016   CRP 3.7 (H) 12/05/2016   REPTSTATUS 10/31/2016 FINAL 10/28/2016   CULT >=100,000 COLONIES/mL ESCHERICHIA COLI (A) 10/28/2016   LABORGA ESCHERICHIA COLI (A) 10/28/2016    @LABSALLVALUES (HGBA1)@  Body mass index is 36.64 kg/m.  Orders:  No orders of the defined types were placed in this encounter.  No orders of the defined types were placed in this encounter.    Procedures: No procedures performed  Clinical Data: No additional findings.  ROS:  All other systems negative, except as noted in the HPI. Review of Systems  Objective: Vital Signs: Ht 5' 6"  (1.676 m)   Wt 227 lb (103 kg)   BMI 36.64 kg/m   Specialty Comments:  No specialty comments available.  PMFS History: Patient Active Problem List   Diagnosis Date Noted  . Chest pain 03/08/2017  . Aortic atherosclerosis ([place]) 01/13/2017  . At high risk for falls 01/13/2017  . Uncontrolled type 2 diabetes mellitus with hyperglycemia ([place]) 01/13/2017  . Stroke-like symptoms   . Complicated migraine   . Mixed hyperlipidemia   . Generalized weakness   . TIA (transient ischemic  attack) 01/02/2017  . Ulcer of toe of right foot, limited to breakdown of skin ([place]) 12/18/2016  . Non-pressure chronic ulcer of other part of right foot limited to breakdown of skin ([place]) 12/07/2016  . Diabetic foot ulcer ([place]) 12/04/2016  . Hypovolemia 12/04/2016  . Chronic pain syndrome 12/04/2016  . Diabetic peripheral neuropathy ([place]) 12/04/2016  . Hypotension   . Liver mass 09/20/2016  . Kidney disease, chronic, stage III (GFR 30-59 ml/min) (HCC) 12/17/2015  . Diabetes mellitus ([place]) 07/18/2015  . Essential hypertension 06/06/2015  . Chronic back pain 07/27/2012  . OSA on CPAP 03/21/2012    Past Medical History:  Diagnosis Date  . Allergy   . Arthritis   . Chronic kidney disease   . Chronic pain   . Coronary artery disease   . Diabetes mellitus without complication ([place])   . Dyspnea   . GERD (gastroesophageal reflux disease)   . Heart murmur   . Hypertension   . Liver disease   . NASH (nonalcoholic steatohepatitis) 09/16/2016  . Obstructive sleep apnea   . Osteomyelitis ([place])   . Peripheral vascular disease ([place])   . Sleep apnea   . Stroke ([place])   . Ulcers of both great toes ([place]) 11/07/2016   right great toe, started as finger nail size and grew in 6 days     Family History  Problem Relation Age of Onset  . Cancer Mother        Thymoma, metastatic  . Heart disease Father   . Stroke Father   . Stroke Maternal Grandfather   . Heart disease Paternal Grandfather   . Multiple sclerosis Sister   . Epilepsy Sister   . Diabetes Neg Hx     Past Surgical History:  Procedure Laterality Date  . ABDOMINAL HYSTERECTOMY    . BACK SURGERY    . CESAREAN SECTION    . CHOLECYSTECTOMY    . HERNIA REPAIR    . JOINT REPLACEMENT    . KNEE SURGERY    . SPINE SURGERY    . TOE AMPUTATION    . TOE AMPUTATION Bilateral    3 on right foot 2 left foot   . TONSILLECTOMY    . TUBAL LIGATION     Social History   Occupational History  . Not on file  Tobacco Use  . Smoking status: Never Smoker  . Smokeless tobacco: Never Used  Substance and Sexual Activity  . Alcohol use: No  . Drug use: No  . Sexual activity: No

## 2017-03-22 ENCOUNTER — Emergency Department (HOSPITAL_COMMUNITY)
Admission: EM | Admit: 2017-03-22 | Discharge: 2017-03-22 | Disposition: A | Discharge status: Home / Self Care | Payer: Medicare HMO | Attending: Emergency Medicine | Admitting: Emergency Medicine

## 2017-03-22 ENCOUNTER — Encounter (HOSPITAL_COMMUNITY): Payer: Self-pay | Admitting: Emergency Medicine

## 2017-03-22 ENCOUNTER — Emergency Department (HOSPITAL_BASED_OUTPATIENT_CLINIC_OR_DEPARTMENT_OTHER)
Admit: 2017-03-22 | Discharge: 2017-03-22 | Disposition: A | Discharge status: Home / Self Care | Payer: Medicare HMO | Attending: Emergency Medicine | Admitting: Emergency Medicine

## 2017-03-22 ENCOUNTER — Ambulatory Visit: Payer: Self-pay | Admitting: *Deleted

## 2017-03-22 ENCOUNTER — Other Ambulatory Visit: Payer: Self-pay

## 2017-03-22 ENCOUNTER — Ambulatory Visit: Payer: Medicare HMO | Admitting: Diagnostic Neuroimaging

## 2017-03-22 ENCOUNTER — Encounter: Payer: Self-pay | Admitting: Diagnostic Neuroimaging

## 2017-03-22 ENCOUNTER — Telehealth: Payer: Self-pay | Admitting: *Deleted

## 2017-03-22 DIAGNOSIS — Z8673 Personal history of transient ischemic attack (TIA), and cerebral infarction without residual deficits: Secondary | ICD-10-CM | POA: Insufficient documentation

## 2017-03-22 DIAGNOSIS — Z9660 Presence of unspecified orthopedic joint implant: Secondary | ICD-10-CM | POA: Diagnosis not present

## 2017-03-22 DIAGNOSIS — M7989 Other specified soft tissue disorders: Secondary | ICD-10-CM | POA: Diagnosis present

## 2017-03-22 DIAGNOSIS — I129 Hypertensive chronic kidney disease with stage 1 through stage 4 chronic kidney disease, or unspecified chronic kidney disease: Secondary | ICD-10-CM | POA: Diagnosis not present

## 2017-03-22 DIAGNOSIS — L03115 Cellulitis of right lower limb: Secondary | ICD-10-CM | POA: Diagnosis not present

## 2017-03-22 DIAGNOSIS — R609 Edema, unspecified: Secondary | ICD-10-CM

## 2017-03-22 DIAGNOSIS — E1140 Type 2 diabetes mellitus with diabetic neuropathy, unspecified: Secondary | ICD-10-CM | POA: Diagnosis not present

## 2017-03-22 DIAGNOSIS — E1122 Type 2 diabetes mellitus with diabetic chronic kidney disease: Secondary | ICD-10-CM | POA: Insufficient documentation

## 2017-03-22 DIAGNOSIS — Z89429 Acquired absence of other toe(s), unspecified side: Secondary | ICD-10-CM | POA: Insufficient documentation

## 2017-03-22 DIAGNOSIS — Z79899 Other long term (current) drug therapy: Secondary | ICD-10-CM | POA: Insufficient documentation

## 2017-03-22 DIAGNOSIS — Z794 Long term (current) use of insulin: Secondary | ICD-10-CM | POA: Insufficient documentation

## 2017-03-22 DIAGNOSIS — Z7901 Long term (current) use of anticoagulants: Secondary | ICD-10-CM | POA: Diagnosis not present

## 2017-03-22 DIAGNOSIS — I700 Atherosclerosis of aorta: Secondary | ICD-10-CM | POA: Insufficient documentation

## 2017-03-22 DIAGNOSIS — N183 Chronic kidney disease, stage 3 (moderate): Secondary | ICD-10-CM | POA: Diagnosis not present

## 2017-03-22 LAB — CBC WITH DIFFERENTIAL/PLATELET
BASOS ABS: 0 10*3/uL (ref 0.0–0.1)
Basophils Relative: 0 %
Eosinophils Absolute: 0.1 10*3/uL (ref 0.0–0.7)
Eosinophils Relative: 2 %
HEMATOCRIT: 34.6 % — AB (ref 36.0–46.0)
HEMOGLOBIN: 11.4 g/dL — AB (ref 12.0–15.0)
LYMPHS PCT: 17 %
Lymphs Abs: 0.8 10*3/uL (ref 0.7–4.0)
MCH: 27.9 pg (ref 26.0–34.0)
MCHC: 32.9 g/dL (ref 30.0–36.0)
MCV: 84.6 fL (ref 78.0–100.0)
MONO ABS: 0.3 10*3/uL (ref 0.1–1.0)
MONOS PCT: 7 %
NEUTROS ABS: 3.6 10*3/uL (ref 1.7–7.7)
NEUTROS PCT: 74 %
Platelets: 152 10*3/uL (ref 150–400)
RBC: 4.09 MIL/uL (ref 3.87–5.11)
RDW: 14.1 % (ref 11.5–15.5)
WBC: 4.8 10*3/uL (ref 4.0–10.5)

## 2017-03-22 LAB — COMPREHENSIVE METABOLIC PANEL
ALBUMIN: 2.9 g/dL — AB (ref 3.5–5.0)
ALT: 21 U/L (ref 14–54)
ANION GAP: 9 (ref 5–15)
AST: 22 U/L (ref 15–41)
Alkaline Phosphatase: 88 U/L (ref 38–126)
BUN: 26 mg/dL — ABNORMAL HIGH (ref 6–20)
CO2: 25 mmol/L (ref 22–32)
Calcium: 8.5 mg/dL — ABNORMAL LOW (ref 8.9–10.3)
Chloride: 99 mmol/L — ABNORMAL LOW (ref 101–111)
Creatinine, Ser: 1.65 mg/dL — ABNORMAL HIGH (ref 0.44–1.00)
GFR calc non Af Amer: 32 mL/min — ABNORMAL LOW (ref >60)
GFR, EST AFRICAN AMERICAN: 37 mL/min — AB (ref >60)
GLUCOSE: 294 mg/dL — AB (ref 65–99)
POTASSIUM: 3.6 mmol/L (ref 3.5–5.1)
SODIUM: 133 mmol/L — AB (ref 135–145)
TOTAL PROTEIN: 6.4 g/dL — AB (ref 6.5–8.1)
Total Bilirubin: 0.6 mg/dL (ref 0.3–1.2)

## 2017-03-22 LAB — I-STAT CG4 LACTIC ACID, ED: Lactic Acid, Venous: 1.15 mmol/L (ref 0.5–1.9)

## 2017-03-22 MED ORDER — CLINDAMYCIN HCL 300 MG PO CAPS: 300.0000 mg | ORAL_CAPSULE | Freq: Four times a day (QID) | ORAL | 0 refills | Status: DC

## 2017-03-22 MED ORDER — OXYCODONE-ACETAMINOPHEN 5-325 MG PO TABS
1.0000 | ORAL_TABLET | Freq: Once | ORAL | Status: AC
  Administered 2017-03-22: 1 via ORAL
  Filled 2017-03-22: qty 1

## 2017-03-22 MED ORDER — CLINDAMYCIN HCL 150 MG PO CAPS
300.0000 mg | ORAL_CAPSULE | Freq: Once | ORAL | Status: AC
  Administered 2017-03-22: 300 mg via ORAL
  Filled 2017-03-22: qty 2

## 2017-03-22 NOTE — ED Provider Notes (Signed)
[facility] EMERGENCY DEPARTMENT Provider Note   CSN: [id number] Arrival date & time: 03/22/17  1112     History   Chief Complaint Chief Complaint  Patient presents with  . Cellulitis    HPI [name] is a 62 y.o. female.  HPI   62 year old female with a past medical history of uncontrolled diabetes presents today with swelling redness noted to the right lower extremity.  Patient has a significant past medical history of chronic ulcer to the plantar aspect of the right foot with multiple amputations of the toes.  She notes she has these debrided by Dr. due to, has no surrounding redness or discharge.  She notes that 2 days ago she noted redness on the anterior aspect of her distal right lower extremity with surrounding edema.  She notes a past medical history of cellulitis that started similarly prior hospitalization eventually.  Past Medical History:  Diagnosis Date  . Allergy   . Arthritis   . Chronic kidney disease   . Chronic pain   . Coronary artery disease   . Diabetes mellitus without complication ([place])   . Dyspnea   . GERD (gastroesophageal reflux disease)   . Heart murmur   . Hypertension   . Liver disease   . NASH (nonalcoholic steatohepatitis) 09/16/2016  . Obstructive sleep apnea   . Osteomyelitis ([place])   . Peripheral vascular disease ([place])   . Sleep apnea   . Stroke ([place])   . Ulcers of both great toes ([place]) 11/07/2016   right great toe, started as finger nail size and grew in 6 days     Patient Active Problem List   Diagnosis Date Noted  . Chest pain 03/08/2017  . Aortic atherosclerosis ([place]) 01/13/2017  . At high risk for falls 01/13/2017  . Uncontrolled type 2 diabetes mellitus with hyperglycemia ([place]) 01/13/2017  . Stroke-like symptoms   . Complicated migraine   . Mixed hyperlipidemia   . Generalized weakness   . TIA (transient ischemic attack) 01/02/2017  . Ulcer of toe of right foot, limited to breakdown of skin ([place])  12/18/2016  . Non-pressure chronic ulcer of other part of right foot limited to breakdown of skin ([place]) 12/07/2016  . Diabetic foot ulcer ([place]) 12/04/2016  . Hypovolemia 12/04/2016  . Chronic pain syndrome 12/04/2016  . Diabetic peripheral neuropathy ([place]) 12/04/2016  . Hypotension   . Liver mass 09/20/2016  . Kidney disease, chronic, stage III (GFR 30-59 ml/min) (HCC) 12/17/2015  . Diabetes mellitus ([place]) 07/18/2015  . Essential hypertension 06/06/2015  . Chronic back pain 07/27/2012  . OSA on CPAP 03/21/2012    Past Surgical History:  Procedure Laterality Date  . ABDOMINAL HYSTERECTOMY    . BACK SURGERY    . CESAREAN SECTION    . CHOLECYSTECTOMY    . HERNIA REPAIR    . JOINT REPLACEMENT    . KNEE SURGERY    . SPINE SURGERY    . TOE AMPUTATION    . TOE AMPUTATION Bilateral    3 on right foot 2 left foot   . TONSILLECTOMY    . TUBAL LIGATION      OB History    No data available       Home Medications    Prior to Admission medications   Medication Sig Start Date End Date Taking? Authorizing Provider  atorvastatin (LIPITOR) 80 MG tablet Take 1 tablet (80 mg total) by mouth daily at 6 PM. 02/08/17   Forrest Moron, MD  BD ULTRA-FINE PEN NEEDLES 29G X 12.7MM MISC  02/22/17   [provider]  busPIRone (BUSPAR) 15 MG tablet Take 1 tablet (15 mg total) by mouth 3 (three) times daily. 02/08/17   Forrest Moron, MD  carvedilol (COREG) 25 MG tablet Take 1 tablet (25 mg total) by mouth 2 (two) times daily. 11/23/16   Forrest Moron, MD  clindamycin (CLEOCIN) 300 MG capsule Take 1 capsule (300 mg total) by mouth 4 (four) times daily. 03/22/17   Davied Nocito, Dellis Filbert, PA-C  clopidogrel (PLAVIX) 75 MG tablet Take 1 tablet (75 mg total) by mouth daily. 02/08/17   Forrest Moron, MD  exenatide (BYETTA 5 MCG PEN) 5 MCG/0.02ML SOPN injection Inject 0.02 mLs (5 mcg total) into the skin 2 (two) times daily with a meal. 11/23/16   Stallings, Zoe A, MD  ezetimibe (ZETIA) 10 MG tablet  Take 1 tablet (10 mg total) by mouth daily. 02/08/17   Forrest Moron, MD  gabapentin (NEURONTIN) 300 MG capsule Take 1 capsule (300 mg total) by mouth 3 (three) times daily. 12/28/16   Forrest Moron, MD  insulin aspart (NOVOLOG) 100 UNIT/ML FlexPen Inject 10 Units into the skin 3 (three) times daily before meals. "PER A SLIDING SCALE OF AN ADDITIONAL 2-10 UNITS" 01/01/17   Forrest Moron, MD  LANTUS SOLOSTAR 100 UNIT/ML Solostar Pen Inject 200 Units into the skin every morning. 03/01/17   Renato Shin, MD  ranolazine (RANEXA) 500 MG 12 hr tablet Take 1 tablet (500 mg total) by mouth 2 (two) times daily. 03/09/17   Park Liter, MD  sertraline (ZOLOFT) 50 MG tablet Take 1 tablet (50 mg total) by mouth daily. 11/23/16   Forrest Moron, MD  tiZANidine (ZANAFLEX) 4 MG tablet Take 4 mg by mouth 3 (three) times daily.    [provider]    Family History Family History  Problem Relation Age of Onset  . Cancer Mother        Thymoma, metastatic  . Heart disease Father   . Stroke Father   . Stroke Maternal Grandfather   . Heart disease Paternal Grandfather   . Multiple sclerosis Sister   . Epilepsy Sister   . Diabetes Neg Hx     Social History Social History   Tobacco Use  . Smoking status: Never Smoker  . Smokeless tobacco: Never Used  Substance Use Topics  . Alcohol use: No  . Drug use: No     Allergies   Lasix [furosemide]; Norvasc [amlodipine besylate]; Prednisone; Vancomycin; Doxycycline; Ibuprofen; Inderal [propranolol]; Lisinopril; Lovastatin; Norvasc [amlodipine besylate]; Nsaids; and Sulfa antibiotics   Review of Systems Review of Systems  All other systems reviewed and are negative.    Physical Exam Updated Vital Signs BP (!) 125/50 (BP Location: Left Arm)   Pulse 64   Temp 98.3 F (36.8 C) (Oral)   Resp 18   SpO2 100%   Physical Exam  Constitutional: She is oriented to person, place, and time. She appears well-developed and  well-nourished.  HENT:  Head: Normocephalic and atraumatic.  Eyes: Conjunctivae are normal. Pupils are equal, round, and reactive to light. Right eye exhibits no discharge. Left eye exhibits no discharge. No scleral icterus.  Neck: Normal range of motion. No JVD present. No tracheal deviation present.  Pulmonary/Chest: Effort normal. No stridor.  Musculoskeletal:  Minimal edema noted to the right lower extremity with tenderness throughout the calf-there are minor erythema at the right distal anterior portion no purulence-chronic ulcer  to the lateral aspect of the right foot - no discharge or surrounding redness  Neurological: She is alert and oriented to person, place, and time. Coordination normal.  Psychiatric: She has a normal mood and affect. Her behavior is normal. Judgment and thought content normal.  Nursing note and vitals reviewed.    ED Treatments / Results  Labs (all labs ordered are listed, but only abnormal results are displayed) Labs Reviewed  COMPREHENSIVE METABOLIC PANEL - Abnormal; Notable for the following components:      Result Value   Sodium 133 (*)    Chloride 99 (*)    Glucose, Bld 294 (*)    BUN 26 (*)    Creatinine, Ser 1.65 (*)    Calcium 8.5 (*)    Total Protein 6.4 (*)    Albumin 2.9 (*)    GFR calc non Af Amer 32 (*)    GFR calc Af Amer 37 (*)    All other components within normal limits  CBC WITH DIFFERENTIAL/PLATELET - Abnormal; Notable for the following components:   Hemoglobin 11.4 (*)    HCT 34.6 (*)    All other components within normal limits  I-STAT CG4 LACTIC ACID, ED  I-STAT CG4 LACTIC ACID, ED    EKG  EKG Interpretation None       Radiology No results found.  Procedures Procedures (including critical care time)  Medications Ordered in ED Medications  clindamycin (CLEOCIN) capsule 300 mg (not administered)  oxyCODONE-acetaminophen (PERCOCET/ROXICET) 5-325 MG per tablet 1 tablet (1 tablet Oral Given 03/22/17 1510)      Initial Impression / Assessment and Plan / ED Course  I have reviewed the triage vital signs and the nursing notes.  Pertinent labs & imaging results that were available during my care of the patient were reviewed by me and considered in my medical decision making (see chart for details).     Final Clinical Impressions(s) / ED Diagnoses   Final diagnoses:  Cellulitis of right lower extremity   Labs: Stat lactic acid, CMP, CBC  Imaging: DVT study  Consults:  Therapeutics: Clindamycin  Discharge Meds: Clindamycin  Assessment/Plan: 40-year-old female presents today with cellulitis of her right lower extremity.  Patient is a diabetic not very well controlled.  Patient's blood sugar 294, she reports this is somewhat reasonable for her, she does have appropriate medications at home.  Patient's creatinine 1.65 is slightly elevated from previous visit on 03/08/2017 at 1.40, she does have consistently elevated creatinine.  The patient without signs of significant systemic illness.  Very minimal involvement at this time.  Patient appropriate for outpatient oral antibiotics.  She will be started on clindamycin, she is given strict return precautions, encouraged follow-up with the primary care in 2 days.  She verbalized understanding and agreement to today's plan had no further questions or concerns at time of discharge.    ED Discharge Orders        Ordered    clindamycin (CLEOCIN) 300 MG capsule  4 times daily     03/22/17 1640       Okey Regal, PA-C 03/22/17 1644    Dorie Rank, MD 03/23/17 1308

## 2017-03-22 NOTE — Telephone Encounter (Signed)
Pt's daughter called her mom has a right leg that is swollen and painful to touch. With purple areas and a "silver dollar circle on her leg that has elongated over night to a dollar stretched out". She is running a fever, states feels hot to touch. She has had cellulitis before and this looks like the start of it. Or a blood clot. She has a cardiac hx, kidney dx and liver hemangioma She taking her to the emergency department at [facility] right now.   Reason for Disposition . [1] Swelling is painful to touch AND [2] fever  Answer Assessment - Initial Assessment Questions 1. ONSET: "When did the swelling start?" (e.g., minutes, hours, days)     Saturday 2. LOCATION: "What part of the leg is swollen?"  "Are both legs swollen or just one leg?"     From ankle up to the knee 3. SEVERITY: "How bad is the swelling?" (e.g., localized; mild, moderate, severe)  - Localized - small area of swelling localized to one leg  - MILD pedal edema - swelling limited to foot and ankle, pitting edema < 1/4 inch (6 mm) deep, rest and elevation eliminate most or all swelling  - MODERATE edema - swelling of lower leg to knee, pitting edema > 1/4 inch (6 mm) deep, rest and elevation only partially reduce swelling  - SEVERE edema - swelling extends above knee, facial or hand swelling present      moderate 4. REDNESS: "Does the swelling look red or infected?"     Purple and purplish blood 5. PAIN: "Is the swelling painful to touch?" If so, ask: "How painful is it?"   (Scale 1-10; mild, moderate or severe)     Pain #7 6. FEVER: "Do you have a fever?" If so, ask: "What is it, how was it measured, and when did it start?"      Fever last night around 100 and feels hot right now 7. CAUSE: "What do you think is causing the leg swelling?"     Cellulitis?, blood clot? 8. MEDICAL HISTORY: "Do you have a history of heart failure, kidney disease, liver failure, or cancer?"     Angina, high blood pressure, hemangioma, kidney  disease  9. RECURRENT SYMPTOM: "Have you had leg swelling before?" If so, ask: "When was the last time?" "What happened that time?"     July 2017 and Nov 2018 10. OTHER SYMPTOMS: "Do you have any other symptoms?" (e.g., chest pain, difficulty breathing)       no 11. PREGNANCY: "Is there any chance you are pregnant?" "When was your last menstrual period?"       no  Protocols used: LEG SWELLING AND EDEMA-A-AH

## 2017-03-22 NOTE — Discharge Instructions (Signed)
Please read attached information. If you experience any new or worsening signs or symptoms please return to the emergency room for evaluation. Please follow-up with your primary care provider or specialist as discussed. Please use medication prescribed only as directed and discontinue taking if you have any concerning signs or symptoms.   °

## 2017-03-22 NOTE — Progress Notes (Signed)
Right lower extremity venous duplex has been completed. Negative for DVT. Results were given to [facility].  03/22/17 4:25 PM [name] RVT

## 2017-03-22 NOTE — Telephone Encounter (Signed)
Patient's daughter cancelled hospital stroke FU appointment with Dr Leta [facility] today. She was unable to get patient here on time. Appointment rescheduled.

## 2017-03-22 NOTE — ED Triage Notes (Signed)
Pt has hx of cellulitis to right leg. Pt noted small red spot the size of a silver dollar, now has entire right leg with swelling, leg is warm.

## 2017-03-23 ENCOUNTER — Telehealth (INDEPENDENT_AMBULATORY_CARE_PROVIDER_SITE_OTHER): Payer: Self-pay | Admitting: Orthopedic Surgery

## 2017-03-23 ENCOUNTER — Telehealth: Payer: Self-pay | Admitting: Family Medicine

## 2017-03-23 NOTE — Telephone Encounter (Signed)
Pt's daughter, [name] calling due to mother having excruciating pain in right leg which was recently diagnosed as cellulitis on 2/18. [name] states that the pt can not bear weight on the leg and screamed out in pain earlier during the day when she attempted to walk on her right leg. Pt has appt scheduled with Dr. Nolon Rod on 2/20 in the am but pt's daughter asking for home care to give her mother relief during the night. Pt's daughter states that the pt could not take tylenol or ibuprofen due to having kidney and liver disease. Pt's daughter states when she took her mother's temperature orally it was 99.2 and 100.3- axillary. Pt is currently taking antibiotics that were prescribed in ED.  Pt's daughter advised to try to keep her mother's leg elevated and to try to apply ice to leg to see if it would give the pt some relief from pain. Advised pt's daughter if pt was still having excruciating pain before appt tomorrow to take her mother back to the ED. Understanding verbalized.

## 2017-03-23 NOTE — Telephone Encounter (Signed)
Called and left VM for pt trying to confirm apt tomorrow 03/24/17. Advised of time, building # and time policies.

## 2017-03-23 NOTE — Telephone Encounter (Signed)
OV notes prior to 01/07/2017 faxed to [facility] [phone]

## 2017-03-24 ENCOUNTER — Other Ambulatory Visit: Payer: Self-pay

## 2017-03-24 ENCOUNTER — Encounter: Payer: Self-pay | Admitting: Family Medicine

## 2017-03-24 ENCOUNTER — Ambulatory Visit (INDEPENDENT_AMBULATORY_CARE_PROVIDER_SITE_OTHER): Payer: Medicare HMO | Admitting: Family Medicine

## 2017-03-24 VITALS — BP 101/63 | HR 98 | Temp 98.0°F | Resp 17 | Ht 66.0 in | Wt 236.4 lb

## 2017-03-24 DIAGNOSIS — G894 Chronic pain syndrome: Secondary | ICD-10-CM

## 2017-03-24 DIAGNOSIS — I10 Essential (primary) hypertension: Secondary | ICD-10-CM | POA: Diagnosis not present

## 2017-03-24 DIAGNOSIS — N183 Chronic kidney disease, stage 3 unspecified: Secondary | ICD-10-CM

## 2017-03-24 DIAGNOSIS — L03115 Cellulitis of right lower limb: Secondary | ICD-10-CM

## 2017-03-24 DIAGNOSIS — E1165 Type 2 diabetes mellitus with hyperglycemia: Secondary | ICD-10-CM | POA: Diagnosis not present

## 2017-03-24 DIAGNOSIS — R739 Hyperglycemia, unspecified: Secondary | ICD-10-CM | POA: Diagnosis not present

## 2017-03-24 NOTE — Progress Notes (Signed)
Chief Complaint  Patient presents with  . Transitions Of Care    hospital f/u /cellulitis    HPI   Right lower extremity Cellulitis Pt was seen in the ER on 03/22/17 for swelling and redness in the right lower extremity She has an [place] in the ER to rule out DVT  She states that she had a bedside ultrasound that did not reveal a clot  She states that she still has pitting edema in the legs Lab Results  Component Value Date   WBC 4.8 03/22/2017   HGB 11.4 (L) 03/22/2017   HCT 34.6 (L) 03/22/2017   MCV 84.6 03/22/2017   PLT 152 03/22/2017    Uncontrolled diabetes She is taking 200 units of lantus and aspart 10 units tid with meals with sliding scales She reports that she is watching her carbs She has been eating vegetables Every couple days she has a banana sandwiches She also reports that her fasting glucose is typically 200 She states that she stays in the 180s and higher Lab Results  Component Value Date   HGBA1C 12.4 (H) 01/02/2017    CKD Stage 3 She has a history of CKD  She states that she wanted to get lasix for her lower extremity edema which is very swollen She states that she drinks water and is up urination especially at night She states that she has extreme thirst She states that she is still drinking sodas and drinks sodas daily and they are diet drinks mixed in   Chemistry      Component Value Date/Time   NA 133 (L) 03/22/2017 1237   NA 135 01/25/2017 1024   K 3.6 03/22/2017 1237   CL 99 (L) 03/22/2017 1237   CO2 25 03/22/2017 1237   BUN 26 (H) 03/22/2017 1237   BUN 30 (H) 01/25/2017 1024   CREATININE 1.65 (H) 03/22/2017 1237      Component Value Date/Time   CALCIUM 8.5 (L) 03/22/2017 1237   ALKPHOS 88 03/22/2017 1237   AST 22 03/22/2017 1237   ALT 21 03/22/2017 1237   BILITOT 0.6 03/22/2017 1237   BILITOT 0.4 01/25/2017 1024      Hypertension: Patient here for follow-up of elevated blood pressure. She is not exercising and is not adherent to  low salt diet.  Blood pressure is well controlled at home. Cardiac symptoms none. Patient denies chest pain, chest pressure/discomfort, claudication, dyspnea, exertional chest pressure/discomfort, fatigue, irregular heart beat and lower extremity edema.  Cardiovascular risk factors: diabetes mellitus and hypertension. Use of agents associated with hypertension: none. History of target organ damage: none. BP Readings from Last 3 Encounters:  03/24/17 101/63  03/22/17 (!) 125/94  03/08/17 112/60    Obesity Pt has cut back on her carbohydrates She states that she has been drinking diet drinks No exercise at this time Body mass index is 38.16 kg/m. Wt Readings from Last 3 Encounters:  03/24/17 236 lb 6.4 oz (107.2 kg)  03/18/17 227 lb (103 kg)  03/08/17 227 lb (103 kg)    Chronic Pain Syndrome She is seeing Dr. Mirna Mires at [facility] for her chronic pain mgmt Denies constipation States that her pain has improved and she has better quality of life Her daughter denies that the patient is having any adverse reactions and she is active during the day time Her pain is about 9/10 daily for her  Past Medical History:  Diagnosis Date  . Allergy   . Arthritis   . Chronic  kidney disease   . Chronic pain   . Coronary artery disease   . Diabetes mellitus without complication ([place])   . Dyspnea   . GERD (gastroesophageal reflux disease)   . Heart murmur   . Hypertension   . Liver disease   . NASH (nonalcoholic steatohepatitis) 09/16/2016  . Obstructive sleep apnea   . Osteomyelitis ([place])   . Peripheral vascular disease ([place])   . Sleep apnea   . Stroke ([place])   . Ulcers of both great toes ([place]) 11/07/2016   right great toe, started as finger nail size and grew in 6 days     Current Outpatient Medications  Medication Sig Dispense Refill  . atorvastatin (LIPITOR) 80 MG tablet Take 1 tablet (80 mg total) by mouth daily at 6 PM. 90 tablet 3  . BD ULTRA-FINE PEN  NEEDLES 29G X 12.7MM MISC   0  . busPIRone (BUSPAR) 15 MG tablet Take 1 tablet (15 mg total) by mouth 3 (three) times daily. 90 tablet 3  . carvedilol (COREG) 25 MG tablet Take 1 tablet (25 mg total) by mouth 2 (two) times daily. 180 tablet 1  . clindamycin (CLEOCIN) 300 MG capsule Take 1 capsule (300 mg total) by mouth 4 (four) times daily. 40 capsule 0  . clopidogrel (PLAVIX) 75 MG tablet Take 1 tablet (75 mg total) by mouth daily. 90 tablet 3  . exenatide (BYETTA 5 MCG PEN) 5 MCG/0.02ML SOPN injection Inject 0.02 mLs (5 mcg total) into the skin 2 (two) times daily with a meal. 1.2 mL 3  . ezetimibe (ZETIA) 10 MG tablet Take 1 tablet (10 mg total) by mouth daily. 90 tablet 3  . gabapentin (NEURONTIN) 300 MG capsule Take 1 capsule (300 mg total) by mouth 3 (three) times daily. 90 capsule 3  . insulin aspart (NOVOLOG) 100 UNIT/ML FlexPen Inject 10 Units into the skin 3 (three) times daily before meals. "PER A SLIDING SCALE OF AN ADDITIONAL 2-10 UNITS" 15 mL 3  . LANTUS SOLOSTAR 100 UNIT/ML Solostar Pen Inject 200 Units into the skin every morning. 25 pen 11  . ranolazine (RANEXA) 500 MG 12 hr tablet Take 1 tablet (500 mg total) by mouth 2 (two) times daily. 60 tablet 6  . sertraline (ZOLOFT) 50 MG tablet Take 1 tablet (50 mg total) by mouth daily. 90 tablet 1  . tiZANidine (ZANAFLEX) 4 MG tablet Take 4 mg by mouth 3 (three) times daily.     No current facility-administered medications for this visit.     Allergies:  Allergies  Allergen Reactions  . Lasix [Furosemide]     Dehydration leading to kidney failure   . Norvasc [Amlodipine Besylate]     Severe edema  . Prednisone Other (See Comments)    Ketoacidosis  . Vancomycin Other (See Comments)    Total organ shut down  . Doxycycline Hives  . Ibuprofen Other (See Comments)    WAS TOLD TO NOT TAKE THIS BECAUSE OF HER KIDNEYS AND LIVER  . Inderal [Propranolol] Cough  . Lisinopril Other (See Comments)    WAS TOLD TO NOT TAKE THIS BECAUSE  OF HER KIDNEYS AND LIVER  . Lovastatin Other (See Comments)    WAS TOLD TO NOT TAKE THIS BECAUSE OF HER KIDNEYS AND LIVER  . Norvasc [Amlodipine Besylate] Other (See Comments)    Severe edema  . Nsaids Other (See Comments)    WAS TOLD TO NOT TAKE THIS BECAUSE OF HER KIDNEYS AND LIVER  . Sulfa Antibiotics Nausea  Only    Past Surgical History:  Procedure Laterality Date  . ABDOMINAL HYSTERECTOMY    . BACK SURGERY    . CESAREAN SECTION    . CHOLECYSTECTOMY    . HERNIA REPAIR    . JOINT REPLACEMENT    . KNEE SURGERY    . SPINE SURGERY    . TOE AMPUTATION    . TOE AMPUTATION Bilateral    3 on right foot 2 left foot   . TONSILLECTOMY    . TUBAL LIGATION      Social History   Socioeconomic History  . Marital status: Divorced    Spouse name: None  . Number of children: None  . Years of education: None  . Highest education level: None  Social Needs  . Financial resource strain: None  . Food insecurity - worry: None  . Food insecurity - inability: None  . Transportation needs - medical: None  . Transportation needs - non-medical: None  Occupational History  . None  Tobacco Use  . Smoking status: Never Smoker  . Smokeless tobacco: Never Used  Substance and Sexual Activity  . Alcohol use: No  . Drug use: No  . Sexual activity: No  Other Topics Concern  . None  Social History Narrative   ** Merged History Encounter **        Family History  Problem Relation Age of Onset  . Cancer Mother        Thymoma, metastatic  . Heart disease Father   . Stroke Father   . Stroke Maternal Grandfather   . Heart disease Paternal Grandfather   . Multiple sclerosis Sister   . Epilepsy Sister   . Diabetes Neg Hx      ROS Review of Systems See HPI Constitution: No fevers or chills No malaise No diaphoresis Skin: No rash or itching Eyes: no blurry vision, no double vision GU: no dysuria or hematuria Neuro: no dizziness or headaches  all others reviewed and negative     Objective: Vitals:   03/24/17 0858  BP: 101/63  Pulse: 98  Resp: 17  Temp: 98 F (36.7 C)  TempSrc: Oral  SpO2: 98%  Weight: 236 lb 6.4 oz (107.2 kg)  Height: 5' 6"  (1.676 m)    Physical Exam  Constitutional: She is oriented to person, place, and time. She appears well-developed and well-nourished.  HENT:  Head: Normocephalic and atraumatic.  Right Ear: External ear normal.  Left Ear: External ear normal.  Eyes: Conjunctivae and EOM are normal.  Neck: Normal range of motion. Neck supple.  Cardiovascular: Normal rate, regular rhythm and normal heart sounds.  No murmur heard. Pulmonary/Chest: Effort normal and breath sounds normal. No stridor. No respiratory distress.  Abdominal: Soft. Bowel sounds are normal. She exhibits no distension and no mass. There is no tenderness. There is no guarding.  Musculoskeletal: Normal range of motion. She exhibits no edema.  Neurological: She is alert and oriented to person, place, and time.  Skin: Capillary refill takes less than 2 seconds.  Trace edema No rubor No erythema  Psychiatric: She has a normal mood and affect. Her behavior is normal. Judgment and thought content normal.     Component     Latest Ref Rng & Units 03/22/2017  Sodium     135 - 145 mmol/L 133 (L)  Potassium     3.5 - 5.1 mmol/L 3.6  Chloride     101 - 111 mmol/L 99 (L)  CO2  22 - 32 mmol/L 25  Glucose     65 - 99 mg/dL 294 (H)  BUN     6 - 20 mg/dL 26 (H)  Creatinine     0.44 - 1.00 mg/dL 1.65 (H)  Calcium     8.9 - 10.3 mg/dL 8.5 (L)  Total Protein     6.5 - 8.1 g/dL 6.4 (L)  Albumin     3.5 - 5.0 g/dL 2.9 (L)  AST     15 - 41 U/L 22  ALT     14 - 54 U/L 21  Alkaline Phosphatase     38 - 126 U/L 88  Total Bilirubin     0.3 - 1.2 mg/dL 0.6  GFR, Est Non African American     >60 mL/min 32 (L)  GFR, Est African American     >60 mL/min 37 (L)  Anion gap     5 - 15 9    Component     Latest Ref Rng & Units 03/22/2017  WBC     4.0 - 10.5  K/uL 4.8  RBC     3.87 - 5.11 MIL/uL 4.09  Hemoglobin     12.0 - 15.0 g/dL 11.4 (L)  HCT     36.0 - 46.0 % 34.6 (L)  MCV     78.0 - 100.0 fL 84.6  MCH     26.0 - 34.0 pg 27.9  MCHC     30.0 - 36.0 g/dL 32.9  RDW     11.5 - 15.5 % 14.1  Platelets     150 - 400 K/uL 152  Neutrophils     % 74  NEUT#     1.7 - 7.7 K/uL 3.6  Lymphocytes     % 17  Lymphocyte #     0.7 - 4.0 K/uL 0.8  Monocytes Relative     % 7  Monocyte #     0.1 - 1.0 K/uL 0.3  Eosinophil     % 2  Eosinophils Absolute     0.0 - 0.7 K/uL 0.1  Basophil     % 0  Basophils Absolute     0.0 - 0.1 K/uL 0.0     Assessment and Plan [name] was seen today for transitions of care.  Diagnoses and all orders for this visit:  Hyperglycemia -     Cancel: POCT glycosylated hemoglobin (Hb A1C) -     Fructosamine  Uncontrolled type 2 diabetes mellitus with hyperglycemia ([place])- improved based on home monitoring Follow up with Endocrinology -     Cancel: POCT glycosylated hemoglobin (Hb A1C) -     Fructosamine  Chronic kidney disease (CKD), stage III (moderate) ([place])- discussed that she cannot take diuretics due to her kidneys She should avoid salty foods and keep elevate -     CBC -     Basic metabolic panel  Cellulitis of right lower extremity- continue elevations of the RLE -     CBC  Essential hypertension- bp is well controlled, continue with Cards recs Might be overcorrected but given her risk of heart attack and stroke will continue to monitor closely Pt has not eaten yet So she should recheck bp after food  Chronic pain syndrome- continue with Pain mgmt  Other orders -     Cancel: MM Digital Screening; Future -     Cancel: Ambulatory referral to Gastroenterology   A total of 40 minutes were spent face-to-face with the patient during this encounter  and over half of that time was spent on counseling and coordination of care.   [place]

## 2017-03-24 NOTE — Patient Instructions (Addendum)
Keep your appointment in March  Continue to make dietary modifications  Keep your leg elevated to reduce the swelling   IF you received an x-ray today, you will receive an invoice from [facility]. Please contact [facility] at [phone] with questions or concerns regarding your invoice.   IF you received labwork today, you will receive an invoice from [facility]. Please contact [facility] at [phone] with questions or concerns regarding your invoice.   Our billing staff will not be able to assist you with questions regarding bills from these companies.  You will be contacted with the lab results as soon as they are available. The fastest way to get your results is to activate your My Chart account. Instructions are located on the last page of this paperwork. If you have not heard from [place] regarding the results in 2 weeks, please contact this office.     Cellulitis, Adult Cellulitis is a skin infection. The infected area is usually red and tender. This condition occurs most often in the arms and lower legs. The infection can travel to the muscles, blood, and underlying tissue and become serious. It is very important to get treated for this condition. What are the causes? Cellulitis is caused by bacteria. The bacteria enter through a break in the skin, such as a cut, burn, insect bite, open sore, or crack. What increases the risk? This condition is more likely to occur in people who:  Have a weak defense system (immune system).  Have open wounds on the skin such as cuts, burns, bites, and scrapes. Bacteria can enter the body through these open wounds.  Are older.  Have diabetes.  Have a type of long-lasting (chronic) liver disease (cirrhosis) or kidney disease.  Use IV drugs.  What are the signs or symptoms? Symptoms of this condition include:  Redness, streaking, or spotting on the skin.  Swollen area of the skin.  Tenderness or pain when an area of the  skin is touched.  Warm skin.  Fever.  Chills.  Blisters.  How is this diagnosed? This condition is diagnosed based on a medical history and physical exam. You may also have tests, including:  Blood tests.  Lab tests.  Imaging tests.  How is this treated? Treatment for this condition may include:  Medicines, such as antibiotic medicines or antihistamines.  Supportive care, such as rest and application of cold or warm cloths (cold or warm compresses) to the skin.  Hospital care, if the condition is severe.  The infection usually gets better within 1-2 days of treatment. Follow these instructions at home:  Take over-the-counter and prescription medicines only as told by your health care provider.  If you were prescribed an antibiotic medicine, take it as told by your health care provider. Do not stop taking the antibiotic even if you start to feel better.  Drink enough fluid to keep your urine clear or pale yellow.  Do not touch or rub the infected area.  Raise (elevate) the infected area above the level of your heart while you are sitting or lying down.  Apply warm or cold compresses to the affected area as told by your health care provider.  Keep all follow-up visits as told by your health care provider. This is important. These visits let your health care provider make sure a more serious infection is not developing. Contact a health care provider if:  You have a fever.  Your symptoms do not improve within 1-2 days of starting treatment.  Your bone or joint underneath the infected area becomes painful after the skin has healed.  Your infection returns in the same area or another area.  You notice a swollen bump in the infected area.  You develop new symptoms.  You have a general ill feeling (malaise) with muscle aches and pains. Get help right away if:  Your symptoms get worse.  You feel very sleepy.  You develop vomiting or diarrhea that  persists.  You notice red streaks coming from the infected area.  Your red area gets larger or turns dark in color. This information is not intended to replace advice given to you by your health care provider. Make sure you discuss any questions you have with your health care provider. Document Released: 10/29/2004 Document Revised: 05/30/2015 Document Reviewed: 11/28/2014 Elsevier Interactive Patient Education  [employer].

## 2017-03-25 ENCOUNTER — Telehealth: Payer: Self-pay

## 2017-03-25 LAB — CBC
HEMOGLOBIN: 11.6 g/dL (ref 11.1–15.9)
Hematocrit: 35.5 % (ref 34.0–46.6)
MCH: 27.2 pg (ref 26.6–33.0)
MCHC: 32.7 g/dL (ref 31.5–35.7)
MCV: 83 fL (ref 79–97)
PLATELETS: 162 10*3/uL (ref 150–379)
RBC: 4.27 x10E6/uL (ref 3.77–5.28)
RDW: 14.5 % (ref 12.3–15.4)
WBC: 4.1 10*3/uL (ref 3.4–10.8)

## 2017-03-25 LAB — BASIC METABOLIC PANEL
BUN/Creatinine Ratio: 12 (ref 12–28)
BUN: 18 mg/dL (ref 8–27)
CALCIUM: 8.3 mg/dL — AB (ref 8.7–10.3)
CHLORIDE: 102 mmol/L (ref 96–106)
CO2: 23 mmol/L (ref 20–29)
CREATININE: 1.55 mg/dL — AB (ref 0.57–1.00)
GFR calc non Af Amer: 36 mL/min/{1.73_m2} — ABNORMAL LOW (ref >59)
GFR, EST AFRICAN AMERICAN: 41 mL/min/{1.73_m2} — AB (ref >59)
GLUCOSE: 199 mg/dL — AB (ref 65–99)
Potassium: 4.1 mmol/L (ref 3.5–5.2)
Sodium: 139 mmol/L (ref 134–144)

## 2017-03-25 LAB — FRUCTOSAMINE: FRUCTOSAMINE: 410 umol/L — AB (ref 0–285)

## 2017-03-25 NOTE — Telephone Encounter (Signed)
Pt's daughter, [name], called to report that the pt is having very watery diarrhea approx every hour and half since last night.  Pt has also broken out in hives today.  Believe it is related to Clindamycin prescribed by the Emergency Dept.  Would like to know if another medication could be prescribed?

## 2017-03-25 NOTE — Telephone Encounter (Signed)
Advised to stop clindamycin To present to clinic on 03/26/17 to get C. Diff culture And her hives could be evaluated then. Daughter agreed to plan

## 2017-03-26 ENCOUNTER — Ambulatory Visit (INDEPENDENT_AMBULATORY_CARE_PROVIDER_SITE_OTHER): Payer: Medicare HMO | Admitting: Family Medicine

## 2017-03-26 ENCOUNTER — Encounter: Payer: Self-pay | Admitting: Family Medicine

## 2017-03-26 VITALS — BP 170/70 | HR 86 | Temp 99.3°F | Ht 66.0 in | Wt 233.0 lb

## 2017-03-26 DIAGNOSIS — R61 Generalized hyperhidrosis: Secondary | ICD-10-CM

## 2017-03-26 DIAGNOSIS — R110 Nausea: Secondary | ICD-10-CM | POA: Diagnosis not present

## 2017-03-26 DIAGNOSIS — R197 Diarrhea, unspecified: Secondary | ICD-10-CM

## 2017-03-26 DIAGNOSIS — L03115 Cellulitis of right lower limb: Secondary | ICD-10-CM

## 2017-03-26 DIAGNOSIS — E162 Hypoglycemia, unspecified: Secondary | ICD-10-CM

## 2017-03-26 LAB — POCT CBC
Granulocyte percent: 74.7 %G (ref 37–80)
HCT, POC: 38.8 % (ref 37.7–47.9)
Hemoglobin: 12.9 g/dL (ref 12.2–16.2)
Lymph, poc: 1.1 (ref 0.6–3.4)
MCH, POC: 27.4 pg (ref 27–31.2)
MCHC: 33.3 g/dL (ref 31.8–35.4)
MCV: 82.1 fL (ref 80–97)
MID (cbc): 0.7 (ref 0–0.9)
MPV: 7.5 fL (ref 0–99.8)
POC Granulocyte: 5.1 (ref 2–6.9)
POC LYMPH PERCENT: 15.7 %L (ref 10–50)
POC MID %: 9.6 %M (ref 0–12)
Platelet Count, POC: 234 10*3/uL (ref 142–424)
RBC: 4.73 M/uL (ref 4.04–5.48)
RDW, POC: 14.3 %
WBC: 6.8 10*3/uL (ref 4.6–10.2)

## 2017-03-26 LAB — GLUCOSE, POCT (MANUAL RESULT ENTRY)
POC Glucose: 79 mg/dl (ref 70–99)
POC Glucose: 91 mg/dl (ref 70–99)

## 2017-03-26 LAB — POCT GLYCOSYLATED HEMOGLOBIN (HGB A1C): Hemoglobin A1C: 12.9

## 2017-03-26 MED ORDER — CEPHALEXIN 500 MG PO CAPS: 500.0000 mg | ORAL_CAPSULE | Freq: Two times a day (BID) | ORAL | 0 refills | Status: DC

## 2017-03-26 NOTE — Patient Instructions (Addendum)
   IF you received an x-ray today, you will receive an invoice from [facility]. Please contact [facility] at [phone] with questions or concerns regarding your invoice.   IF you received labwork today, you will receive an invoice from [facility]. Please contact [facility] at [phone] with questions or concerns regarding your invoice.   Our billing staff will not be able to assist you with questions regarding bills from these companies.  You will be contacted with the lab results as soon as they are available. The fastest way to get your results is to activate your My Chart account. Instructions are located on the last page of this paperwork. If you have not heard from us regarding the results in 2 weeks, please contact this office.     Hypoglycemia Hypoglycemia is when the sugar (glucose) level in the blood is too low. Symptoms of low blood sugar may include:  Feeling: ? Hungry. ? Worried or nervous (anxious). ? Sweaty and clammy. ? Confused. ? Dizzy. ? Sleepy. ? Sick to your stomach (nauseous).  Having: ? A fast heartbeat. ? A headache. ? A change in your vision. ? Jerky movements that you cannot control (seizure). ? Nightmares. ? Tingling or no feeling (numbness) around the mouth, lips, or tongue.  Having trouble with: ? Talking. ? Paying attention (concentrating). ? Moving (coordination). ? Sleeping.  Shaking.  Passing out (fainting).  Getting upset easily (irritability).  Low blood sugar can happen to people who have diabetes and people who do not have diabetes. Low blood sugar can happen quickly, and it can be an emergency. Treating Low Blood Sugar Low blood sugar is often treated by eating or drinking something sugary right away. If you can think clearly and swallow safely, follow the 15:15 rule:  Take 15 grams of a fast-acting carb (carbohydrate). Some fast-acting carbs are: ? 1 tube of glucose gel. ? 3 sugar tablets (glucose  pills). ? 6-8 pieces of hard candy. ? 4 oz (120 mL) of fruit juice. ? 4 oz (120 mL) of regular (not diet) soda.  Check your blood sugar 15 minutes after you take the carb.  If your blood sugar is still at or below 70 mg/dL (3.9 mmol/L), take 15 grams of a carb again.  If your blood sugar does not go above 70 mg/dL (3.9 mmol/L) after 3 tries, get help right away.  After your blood sugar goes back to normal, eat a meal or a snack within 1 hour.  Treating Very Low Blood Sugar If your blood sugar is at or below 54 mg/dL (3 mmol/L), you have very low blood sugar (severe hypoglycemia). This is an emergency. Do not wait to see if the symptoms will go away. Get medical help right away. Call your local emergency services (911 in the [place] Do not drive yourself to the hospital. If you have very low blood sugar and you cannot eat or drink, you may need a glucagon shot (injection). A family member or friend should learn how to check your blood sugar and how to give you a glucagon shot. Ask your doctor if you need to have a glucagon shot kit at home. Follow these instructions at home: General instructions  Avoid any diets that cause you to not eat enough food. Talk with your doctor before you start any new diet.  Take over-the-counter and prescription medicines only as told by your doctor.  Limit alcohol to no more than 1 drink per day for nonpregnant women and 2 drinks   per day for men. One drink equals 12 oz of beer, 5 oz of wine, or 1 oz of hard liquor.  Keep all follow-up visits as told by your doctor. This is important. If You Have Diabetes:   Make sure you know the symptoms of low blood sugar.  Always keep a source of sugar with you, such as: ? Sugar. ? Sugar tablets. ? Glucose gel. ? Fruit juice. ? Regular soda (not diet soda). ? Milk. ? Hard candy. ? Honey.  Take your medicines as told.  Follow your exercise and meal plan. ? Eat on time. Do not skip meals. ? Follow your sick  day plan when you cannot eat or drink normally. Make this plan ahead of time with your doctor.  Check your blood sugar as often as told by your doctor. Always check before and after exercise.  Share your diabetes care plan with: ? Your work or school. ? People you live with.  Check your pee (urine) for ketones: ? When you are sick. ? As told by your doctor.  Carry a card or wear jewelry that says you have diabetes. If You Have Low Blood Sugar From Other Causes:   Check your blood sugar as often as told by your doctor.  Follow instructions from your doctor about what you cannot eat or drink. Contact a doctor if:  You have trouble keeping your blood sugar in your target range.  You have low blood sugar often. Get help right away if:  You still have symptoms after you eat or drink something sugary.  Your blood sugar is at or below 54 mg/dL (3 mmol/L).  You have jerky movements that you cannot control.  You pass out. These symptoms may be an emergency. Do not wait to see if the symptoms will go away. Get medical help right away. Call your local emergency services (911 in the [place] Do not drive yourself to the hospital. This information is not intended to replace advice given to you by your health care provider. Make sure you discuss any questions you have with your health care provider. Document Released: 04/15/2009 Document Revised: 06/27/2015 Document Reviewed: 02/22/2015 Elsevier Interactive Patient Education  2018 [employer]  

## 2017-03-26 NOTE — Progress Notes (Signed)
2/22/201912:15 PM  [name] 27-Jan-1956, 62 y.o. female [id number]  Chief Complaint  Patient presents with  . Rash    started x 1 day - total body  . Leg Swelling    x 6 days  . clammy skiin    pt feels clammy at triage, pt complaining of being hot, fanning  . Hyperglycemia    323 this am @ 7am    HPI:   Patient is a 62 y.o. female with past medical history significant for DM2, CKD3, cellulilitis who presents today for rash.  Started clinda Monday for cellulitis of RLE Started having  Itchiness and red rash over all body yesterday, last dose about 24 hours ago, has not taken any antihistamine, rash and itchiness have faded, swelling and redness on RLE a bit more today She otherwise is not feeling well at all, she feels clammy, as if she is going to faint, sweating profusely She took her insulin this morning but has not had much to eat as she has not had much of an appettite She has chronic diarrhea, but it has becomes worse since she started clindamycin She denies any SOB, swelling of lips, tongue or throat She denies any chest pain or palpitations She denies any nausea, vomiting or abd pain She has a h/o sepsis with previous cellulitis She reports this feels like when her sugars are low, she becomes symptomatic once she is in the 80s She sees endocrinology  crt 1.55, gfr 36   Depression screen [facility] 2/9 03/26/2017 03/24/2017 02/24/2017  Decreased Interest 0 0 0  Down, Depressed, Hopeless 0 0 0  PHQ - 2 Score 0 0 0    Allergies  Allergen Reactions  . Lasix [Furosemide]     Dehydration leading to kidney failure   . Norvasc [Amlodipine Besylate]     Severe edema  . Prednisone Other (See Comments)    Ketoacidosis  . Vancomycin Other (See Comments)    Total organ shut down  . Doxycycline Hives  . Ibuprofen Other (See Comments)    WAS TOLD TO NOT TAKE THIS BECAUSE OF HER KIDNEYS AND LIVER  . Inderal [Propranolol] Cough  . Lisinopril Other (See Comments)    WAS  TOLD TO NOT TAKE THIS BECAUSE OF HER KIDNEYS AND LIVER  . Lovastatin Other (See Comments)    WAS TOLD TO NOT TAKE THIS BECAUSE OF HER KIDNEYS AND LIVER  . Norvasc [Amlodipine Besylate] Other (See Comments)    Severe edema  . Nsaids Other (See Comments)    WAS TOLD TO NOT TAKE THIS BECAUSE OF HER KIDNEYS AND LIVER  . Sulfa Antibiotics Nausea Only    Prior to Admission medications   Medication Sig Start Date End Date Taking? Authorizing Provider  atorvastatin (LIPITOR) 80 MG tablet Take 1 tablet (80 mg total) by mouth daily at 6 PM. 02/08/17  Yes Stallings, Zoe A, MD  BD ULTRA-FINE PEN NEEDLES 29G X 12.7MM MISC  02/22/17  Yes [provider]  busPIRone (BUSPAR) 15 MG tablet Take 1 tablet (15 mg total) by mouth 3 (three) times daily. 02/08/17  Yes Forrest Moron, MD  carvedilol (COREG) 25 MG tablet Take 1 tablet (25 mg total) by mouth 2 (two) times daily. 11/23/16  Yes Forrest Moron, MD  clindamycin (CLEOCIN) 300 MG capsule Take 1 capsule (300 mg total) by mouth 4 (four) times daily. 03/22/17  Yes Hedges, Dellis Filbert, PA-C  clopidogrel (PLAVIX) 75 MG tablet Take 1 tablet (75 mg total) by mouth  daily. 02/08/17  Yes Stallings, Zoe A, MD  exenatide (BYETTA 5 MCG PEN) 5 MCG/0.02ML SOPN injection Inject 0.02 mLs (5 mcg total) into the skin 2 (two) times daily with a meal. 11/23/16  Yes Stallings, Zoe A, MD  ezetimibe (ZETIA) 10 MG tablet Take 1 tablet (10 mg total) by mouth daily. 02/08/17  Yes Delia Chimes A, MD  gabapentin (NEURONTIN) 300 MG capsule Take 1 capsule (300 mg total) by mouth 3 (three) times daily. 12/28/16  Yes Stallings, Zoe A, MD  insulin aspart (NOVOLOG) 100 UNIT/ML FlexPen Inject 10 Units into the skin 3 (three) times daily before meals. "PER A SLIDING SCALE OF AN ADDITIONAL 2-10 UNITS" 01/01/17  Yes Stallings, Zoe A, MD  LANTUS SOLOSTAR 100 UNIT/ML Solostar Pen Inject 200 Units into the skin every morning. 03/01/17  Yes Renato Shin, MD  ranolazine (RANEXA) 500 MG 12 hr tablet  Take 1 tablet (500 mg total) by mouth 2 (two) times daily. 03/09/17  Yes Park Liter, MD  sertraline (ZOLOFT) 50 MG tablet Take 1 tablet (50 mg total) by mouth daily. 11/23/16  Yes Stallings, Zoe A, MD  tiZANidine (ZANAFLEX) 4 MG tablet Take 4 mg by mouth 3 (three) times daily.   Yes [provider]    Past Medical History:  Diagnosis Date  . Allergy   . Arthritis   . Chronic kidney disease   . Chronic pain   . Coronary artery disease   . Diabetes mellitus without complication ([place])   . Dyspnea   . GERD (gastroesophageal reflux disease)   . Heart murmur   . Hypertension   . Liver disease   . NASH (nonalcoholic steatohepatitis) 09/16/2016  . Obstructive sleep apnea   . Osteomyelitis ([place])   . Peripheral vascular disease ([place])   . Sleep apnea   . Stroke ([place])   . Ulcers of both great toes ([place]) 11/07/2016   right great toe, started as finger nail size and grew in 6 days     Past Surgical History:  Procedure Laterality Date  . ABDOMINAL HYSTERECTOMY    . BACK SURGERY    . CESAREAN SECTION    . CHOLECYSTECTOMY    . HERNIA REPAIR    . JOINT REPLACEMENT    . KNEE SURGERY    . SPINE SURGERY    . TOE AMPUTATION    . TOE AMPUTATION Bilateral    3 on right foot 2 left foot   . TONSILLECTOMY    . TUBAL LIGATION      Social History   Tobacco Use  . Smoking status: Never Smoker  . Smokeless tobacco: Never Used  Substance Use Topics  . Alcohol use: No    Family History  Problem Relation Age of Onset  . Cancer Mother        Thymoma, metastatic  . Heart disease Father   . Stroke Father   . Stroke Maternal Grandfather   . Heart disease Paternal Grandfather   . Multiple sclerosis Sister   . Epilepsy Sister   . Diabetes Neg Hx     Review of Systems  Constitutional: Positive for diaphoresis, malaise/fatigue and weight loss. Negative for chills and fever.  HENT: Negative for congestion, ear pain and sore throat.   Eyes: Negative for blurred vision and  double vision.  Respiratory: Negative for cough and shortness of breath.   Cardiovascular: Positive for leg swelling. Negative for chest pain and palpitations.  Gastrointestinal: Positive for diarrhea. Negative for abdominal pain, blood in  stool, melena, nausea and vomiting.  Genitourinary: Negative for dysuria and hematuria.  Skin: Positive for itching and rash.  Neurological: Positive for dizziness and weakness. Negative for sensory change, speech change, focal weakness and headaches.  Endo/Heme/Allergies: Negative for polydipsia.     OBJECTIVE:  Blood pressure 140/70, pulse 86, temperature 99.3 F (37.4 C), temperature source Oral, height 5' 6"  (1.676 m), weight 233 lb (105.7 kg), SpO2 98 %.  Repeat vitals remained stable  Physical Exam  Constitutional: She is oriented to person, place, and time. Vital signs are normal.  Patient lying down, looking distressed, unable to sit up, pale, sweating profusely, clothing soaked, oriented x 3.   As her sugar raised, her diaphoresis and paleness resolved.   HENT:  Head: Normocephalic and atraumatic.  Mouth/Throat: Oropharynx is clear and moist. No oropharyngeal exudate.  Eyes: EOM are normal. Pupils are equal, round, and reactive to light. No scleral icterus.  Neck: Neck supple.  Cardiovascular: Normal rate, regular rhythm and normal heart sounds. Exam reveals no gallop and no friction rub.  No murmur heard. Pulmonary/Chest: Effort normal and breath sounds normal. She has no wheezes. She has no rales.  Musculoskeletal: She exhibits no edema.  Neurological: She is alert and oriented to person, place, and time.  Patient left ambulating steady with minimal assistance  Skin: Skin is warm. She is diaphoretic.       Results for orders placed or performed in visit on 03/26/17 (from the past 24 hour(s))  POCT glucose (manual entry)     Status: None   Collection Time: 03/26/17 12:26 PM  Result Value Ref Range   POC Glucose 79 70 - 99 mg/dl    POCT CBC     Status: None   Collection Time: 03/26/17 12:45 PM  Result Value Ref Range   WBC 6.8 4.6 - 10.2 K/uL   Lymph, poc 1.1 0.6 - 3.4   POC LYMPH PERCENT 15.7 10 - 50 %L   MID (cbc) 0.7 0 - 0.9   POC MID % 9.6 0 - 12 %M   POC Granulocyte 5.1 2 - 6.9   Granulocyte percent 74.7 37 - 80 %G   RBC 4.73 4.04 - 5.48 M/uL   Hemoglobin 12.9 12.2 - 16.2 g/dL   HCT, POC 38.8 37.7 - 47.9 %   MCV 82.1 80 - 97 fL   MCH, POC 27.4 27 - 31.2 pg   MCHC 33.3 31.8 - 35.4 g/dL   RDW, POC 14.3 %   Platelet Count, POC 234 142 - 424 K/uL   MPV 7.5 0 - 99.8 fL  POCT glucose (manual entry)     Status: None   Collection Time: 03/26/17 12:49 PM  Result Value Ref Range   POC Glucose 91 70 - 99 mg/dl    No results found.   ASSESSMENT and PLAN  1. Nausea without vomiting Symptoms due to hypoglycemia, no evidence of anaphylaxis with stable vitals, no SOB nor vomiting.  - POCT CBC - POCT glucose (manual entry) - Comprehensive metabolic panel  2. Diaphoresis - POCT CBC - POCT glucose (manual entry) - Comprehensive metabolic panel  3. Hypoglycemia Hypoglycemia slowly corrected with oral intake, discussed further management for today, patient educational handout given - POCT glucose (manual entry) - POCT glycosylated hemoglobin (Hb A1C)  4. Cellulitis of right lower extremity Much improved but possible reaction to clinda, given allergies, will complete tx course with keflex.   5. Diarrhea, unspecified type Seems long standing and might be related to  long standing uncontrolled DM, however given recent tx with clinda, will r/o c diff - Clostridium Difficile by PCR; Future  Other orders - cephALEXin (KEFLEX) 500 MG capsule; Take 1 capsule (500 mg total) by mouth 2 (two) times daily.  Return in about 3 days (around 03/29/2017).    Rutherford Guys, MD [facility] at [facility], [place] [address] Ph.  [phone] Fax [phone]

## 2017-03-27 LAB — COMPREHENSIVE METABOLIC PANEL
ALT: 19 IU/L (ref 0–32)
AST: 16 IU/L (ref 0–40)
Albumin/Globulin Ratio: 1.2 (ref 1.2–2.2)
Albumin: 3.9 g/dL (ref 3.6–4.8)
Alkaline Phosphatase: 116 IU/L (ref 39–117)
BUN/Creatinine Ratio: 12 (ref 12–28)
BUN: 17 mg/dL (ref 8–27)
Bilirubin Total: 0.5 mg/dL (ref 0.0–1.2)
CO2: 19 mmol/L — ABNORMAL LOW (ref 20–29)
Calcium: 9.6 mg/dL (ref 8.7–10.3)
Chloride: 105 mmol/L (ref 96–106)
Creatinine, Ser: 1.39 mg/dL — ABNORMAL HIGH (ref 0.57–1.00)
GFR calc Af Amer: 47 mL/min/{1.73_m2} — ABNORMAL LOW (ref >59)
GFR calc non Af Amer: 41 mL/min/{1.73_m2} — ABNORMAL LOW (ref >59)
Globulin, Total: 3.3 g/dL (ref 1.5–4.5)
Glucose: 83 mg/dL (ref 65–99)
Potassium: 4.2 mmol/L (ref 3.5–5.2)
Sodium: 143 mmol/L (ref 134–144)
Total Protein: 7.2 g/dL (ref 6.0–8.5)

## 2017-03-29 ENCOUNTER — Other Ambulatory Visit: Payer: Self-pay

## 2017-03-29 ENCOUNTER — Ambulatory Visit (INDEPENDENT_AMBULATORY_CARE_PROVIDER_SITE_OTHER): Payer: Medicare HMO | Admitting: Family Medicine

## 2017-03-29 ENCOUNTER — Encounter: Payer: Self-pay | Admitting: Family Medicine

## 2017-03-29 VITALS — BP 96/61 | HR 94 | Temp 98.9°F | Resp 16 | Ht 66.0 in | Wt 237.0 lb

## 2017-03-29 DIAGNOSIS — R197 Diarrhea, unspecified: Secondary | ICD-10-CM

## 2017-03-29 DIAGNOSIS — L97511 Non-pressure chronic ulcer of other part of right foot limited to breakdown of skin: Secondary | ICD-10-CM | POA: Diagnosis not present

## 2017-03-29 DIAGNOSIS — E1165 Type 2 diabetes mellitus with hyperglycemia: Secondary | ICD-10-CM

## 2017-03-29 DIAGNOSIS — E1122 Type 2 diabetes mellitus with diabetic chronic kidney disease: Secondary | ICD-10-CM | POA: Diagnosis not present

## 2017-03-29 DIAGNOSIS — E118 Type 2 diabetes mellitus with unspecified complications: Secondary | ICD-10-CM

## 2017-03-29 DIAGNOSIS — Z789 Other specified health status: Secondary | ICD-10-CM

## 2017-03-29 DIAGNOSIS — N183 Chronic kidney disease, stage 3 unspecified: Secondary | ICD-10-CM

## 2017-03-29 DIAGNOSIS — R2681 Unsteadiness on feet: Secondary | ICD-10-CM | POA: Diagnosis not present

## 2017-03-29 DIAGNOSIS — R0989 Other specified symptoms and signs involving the circulatory and respiratory systems: Secondary | ICD-10-CM

## 2017-03-29 LAB — GLUCOSE, POCT (MANUAL RESULT ENTRY): POC GLUCOSE: 179 mg/dL — AB (ref 70–99)

## 2017-03-29 NOTE — Progress Notes (Signed)
Chief Complaint  Patient presents with  . diarrhea, cellulitis right leg and nausea/vomiting    f/u from friday. Per pt felling nauseous, and infection in right leg moving down in foot, throbbing pain in foot all weekend.  Fasting sugar this morning of almost 400    HPI  Pt was seen on 03/26/17 for her diarrhea after finishing clindamycin She had hives all over but had not taken any antihistamine Her exam of the right lower extremity where she had cellulitis showed only faint erythema at that time Her POCT glucose was 79 so she was given food and it improved to 91  She was changed from clindamycin to Keflex She feels like her right foot is throbbing around the toes She states that this morning she also feels lightheaded and dizzy She took her bp meds this morning She only had a chocolate pop tart for breakfast She is drinking a zero calorie beverage this morning  She has not gone to nutrition as yet because she was sick after    Past Medical History:  Diagnosis Date  . Allergy   . Arthritis   . Chronic kidney disease   . Chronic pain   . Coronary artery disease   . Diabetes mellitus without complication ([place])   . Dyspnea   . GERD (gastroesophageal reflux disease)   . Heart murmur   . Hypertension   . Liver disease   . NASH (nonalcoholic steatohepatitis) 09/16/2016  . Obstructive sleep apnea   . Osteomyelitis ([place])   . Peripheral vascular disease ([place])   . Sleep apnea   . Stroke ([place])   . Ulcers of both great toes ([place]) 11/07/2016   right great toe, started as finger nail size and grew in 6 days     Current Outpatient Medications  Medication Sig Dispense Refill  . atorvastatin (LIPITOR) 80 MG tablet Take 1 tablet (80 mg total) by mouth daily at 6 PM. 90 tablet 3  . BD ULTRA-FINE PEN NEEDLES 29G X 12.7MM MISC   0  . busPIRone (BUSPAR) 15 MG tablet Take 1 tablet (15 mg total) by mouth 3 (three) times daily. 90 tablet 3  . carvedilol (COREG) 25 MG tablet Take 1 tablet  (25 mg total) by mouth 2 (two) times daily. 180 tablet 1  . cephALEXin (KEFLEX) 500 MG capsule Take 1 capsule (500 mg total) by mouth 2 (two) times daily. 10 capsule 0  . clindamycin (CLEOCIN) 300 MG capsule Take 1 capsule (300 mg total) by mouth 4 (four) times daily. 40 capsule 0  . clopidogrel (PLAVIX) 75 MG tablet Take 1 tablet (75 mg total) by mouth daily. 90 tablet 3  . exenatide (BYETTA 5 MCG PEN) 5 MCG/0.02ML SOPN injection Inject 0.02 mLs (5 mcg total) into the skin 2 (two) times daily with a meal. 1.2 mL 3  . ezetimibe (ZETIA) 10 MG tablet Take 1 tablet (10 mg total) by mouth daily. 90 tablet 3  . gabapentin (NEURONTIN) 300 MG capsule Take 1 capsule (300 mg total) by mouth 3 (three) times daily. 90 capsule 3  . insulin aspart (NOVOLOG) 100 UNIT/ML FlexPen Inject 10 Units into the skin 3 (three) times daily before meals. "PER A SLIDING SCALE OF AN ADDITIONAL 2-10 UNITS" 15 mL 3  . LANTUS SOLOSTAR 100 UNIT/ML Solostar Pen Inject 200 Units into the skin every morning. 25 pen 11  . ranolazine (RANEXA) 500 MG 12 hr tablet Take 1 tablet (500 mg total) by mouth 2 (two) times daily. [place]  tablet 6  . sertraline (ZOLOFT) 50 MG tablet Take 1 tablet (50 mg total) by mouth daily. 90 tablet 1  . tiZANidine (ZANAFLEX) 4 MG tablet Take 4 mg by mouth 3 (three) times daily.     No current facility-administered medications for this visit.     Allergies:  Allergies  Allergen Reactions  . Lasix [Furosemide]     Dehydration leading to kidney failure   . Norvasc [Amlodipine Besylate]     Severe edema  . Prednisone Other (See Comments)    Ketoacidosis  . Vancomycin Other (See Comments)    Total organ shut down  . Doxycycline Hives  . Ibuprofen Other (See Comments)    WAS TOLD TO NOT TAKE THIS BECAUSE OF HER KIDNEYS AND LIVER  . Inderal [Propranolol] Cough  . Lisinopril Other (See Comments)    WAS TOLD TO NOT TAKE THIS BECAUSE OF HER KIDNEYS AND LIVER  . Lovastatin Other (See Comments)    WAS TOLD TO  NOT TAKE THIS BECAUSE OF HER KIDNEYS AND LIVER  . Norvasc [Amlodipine Besylate] Other (See Comments)    Severe edema  . Nsaids Other (See Comments)    WAS TOLD TO NOT TAKE THIS BECAUSE OF HER KIDNEYS AND LIVER  . Sulfa Antibiotics Nausea Only    Past Surgical History:  Procedure Laterality Date  . ABDOMINAL HYSTERECTOMY    . BACK SURGERY    . CESAREAN SECTION    . CHOLECYSTECTOMY    . HERNIA REPAIR    . JOINT REPLACEMENT    . KNEE SURGERY    . SPINE SURGERY    . TOE AMPUTATION    . TOE AMPUTATION Bilateral    3 on right foot 2 left foot   . TONSILLECTOMY    . TUBAL LIGATION      Social History   Socioeconomic History  . Marital status: Divorced    Spouse name: None  . Number of children: None  . Years of education: None  . Highest education level: None  Social Needs  . Financial resource strain: None  . Food insecurity - worry: None  . Food insecurity - inability: None  . Transportation needs - medical: None  . Transportation needs - non-medical: None  Occupational History  . None  Tobacco Use  . Smoking status: Never Smoker  . Smokeless tobacco: Never Used  Substance and Sexual Activity  . Alcohol use: No  . Drug use: No  . Sexual activity: No  Other Topics Concern  . None  Social History Narrative   ** Merged History Encounter **        Family History  Problem Relation Age of Onset  . Cancer Mother        Thymoma, metastatic  . Heart disease Father   . Stroke Father   . Stroke Maternal Grandfather   . Heart disease Paternal Grandfather   . Multiple sclerosis Sister   . Epilepsy Sister   . Diabetes Neg Hx      ROS Review of Systems See HPI Constitution: No fevers or chills No malaise No diaphoresis Skin: No rash or itching Eyes: no blurry vision, no double vision GU: no dysuria or hematuria Neuro: no dizziness or headaches * all others reviewed and negative   Objective: Vitals:   03/29/17 0935  BP: 96/61  Pulse: 94  Resp: 16    Temp: 98.9 F (37.2 C)  TempSrc: Oral  SpO2: 96%  Weight: 237 lb (107.5 kg)  Height: 5' 6"  (  1.676 m)  Body mass index is 38.25 kg/m.   Physical Exam  Constitutional: She is oriented to person, place, and time. She appears well-developed and well-nourished.  HENT:  Head: Normocephalic and atraumatic.  Eyes: Conjunctivae and EOM are normal.  Cardiovascular: Normal rate, regular rhythm and normal heart sounds.  No murmur heard. Pulmonary/Chest: Effort normal and breath sounds normal. No stridor. No respiratory distress. She has no wheezes.  Neurological: She is alert and oriented to person, place, and time.  Walks with a cane  Psychiatric: Her speech is normal and behavior is normal. Judgment and thought content normal. Cognition and memory are normal. She exhibits a depressed mood.    Right foot with 2 toes No wound break down  Eschar seen on the great toe DP and PT 2+   Assessment and Plan Nedda was seen today for diarrhea, cellulitis right leg and nausea/vomiting.  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[place]) -     Ambulatory referral to Social Work  Medically complex patient -     Ambulatory referral to Social Work  Diarrhea, unspecified type- dropped off stool sample today -     Clostridium Difficile by PCR  Ulcer of toe of right foot, limited to breakdown of skin ([place])- improving  Other orders -     Cancel: Pneumococcal conjugate vaccine 13-valent IM -     Cancel: MM Digital Screening; Future -     Cancel: Ambulatory referral to  Gastroenterology   A total of 60 minutes were spent face-to-face with the patient during this encounter and over half of that time was spent on counseling and coordination of care. Discussed safety at home and placement issues Discussed that since her diabetes is not improving and her insight is so poor she should consider a living situation that is supportive of her needs.  Completed FL2 form and discussed available options for skilled nursing Pt did not take the discussion well and started to argue with her daughter and the conversation was not left with any resolution thus a one week follow up is planned to continue the discussion  She was advised to follow up with Nutrition for teaching as pt does not understand what to eat but also seems to eat due to stress and depression.  [place]

## 2017-03-29 NOTE — Patient Instructions (Addendum)
IF you received an x-ray today, you will receive an invoice from [facility]. Please contact [facility] at [phone] with questions or concerns regarding your invoice.   IF you received labwork today, you will receive an invoice from [facility]. Please contact [facility] at [phone] with questions or concerns regarding your invoice.   Our billing staff will not be able to assist you with questions regarding bills from these companies.  You will be contacted with the lab results as soon as they are available. The fastest way to get your results is to activate your My Chart account. Instructions are located on the last page of this paperwork. If you have not heard from [place] regarding the results in 2 weeks, please contact this office.     Coping With Diabetes Diabetes (type 1 diabetes mellitus or type 2 diabetes mellitus) is a condition in which the body does not have enough of a hormone called insulin, or the body does not respond properly to insulin. Normally, insulin allows sugars (glucose) to enter cells in the body. The cells use glucose for energy. With diabetes, extra glucose builds up in the blood instead of going into cells, which results in high blood glucose (hyperglycemia). How to manage lifestyle changes Managing diabetes includes medical treatments as well as lifestyle changes. If diabetes is not managed well, serious physical and emotional complications can occur. Taking good care of yourself means that you are responsible for:  Monitoring glucose regularly.  Eating a healthy diet.  Exercising regularly.  Meeting with health care providers.  Taking medicines as directed.  Some people may feel a lot of stress about managing their diabetes. This is known as emotional distress, and it is very common. Living with diabetes can place you at risk for emotional distress, depression, or anxiety. These disorders can be confusing and can make diabetes management  more difficult. How to recognize stress Emotional distress Symptoms of emotional distress include:  Anger about having a diagnosis of diabetes.  Fear or frustration about your diagnosis and the changes you need to make to manage the condition.  Being overly worried about the care that you need or the cost of the care you need.  Feeling like you caused your condition by doing something wrong.  Fear of unpredictable situations, like low or high blood glucose.  Feeling judged by your health care providers.  Feeling very alone with the disease.  Getting too tired or "burned out" with the demands of daily care.  Depression Having diabetes means that you are at a higher risk for depression. Having depression also means that you are at a higher risk for diabetes. Your health care provider may test (screen) you for symptoms of depression. It is important to recognize depression symptoms and to start treatment for it soon after it is diagnosed. The following are some symptoms of depression:  Loss of interest in things that you used to enjoy.  Trouble sleeping, or often waking up early and not being able to get back to sleep.  A change in appetite.  Feeling tired most of the day.  Feeling nervous and anxious.  Feeling guilty and worrying that you are a burden to others.  Feeling depressed more often than you do not feel that way.  Thoughts of hurting yourself or feeling that you want to die.  If you have any of these symptoms for 2 weeks or longer, reach out to a health care provider. Where to find support  Ask  your health care provider to recommend a therapist who understands both depression and diabetes.  Search for information and support from the American Diabetes Association: www.diabetes.org  Find a certified diabetes educator and make an appointment through [place] of Diabetes Educators: www.diabeteseducator.org Follow these instructions at home: Managing  emotional distress The following are some ways to manage emotional distress:  Talk with your health care provider or certified diabetes educator. Consider working with a counselor or therapist.  Learn as much as you can about diabetes and its treatment. Meet with a certified diabetes educator or take a class to learn how to manage your condition.  Keep a journal of your thoughts and concerns.  Accept that some things are out of your control.  Talk with other people who have diabetes. It can help to talk with others about the emotional distress that you feel.  Find ways to manage stress that work for you. These may include art or music therapy, exercise, meditation, and hobbies.  Seek support from spiritual leaders, family, and friends.  General instructions  Follow your diabetes management plan.  Keep all follow-up visits as told by your health care provider. This is important. Get help right away if:  You have thoughts about hurting yourself or others. If you ever feel like you may hurt yourself or others, or have thoughts about taking your own life, get help right away. You can go to your nearest emergency department or call:  Your local emergency services (911 in the [place]  A suicide crisis helpline, such as the [facility] at 806-864-1459. This is open 24 hours a day.  Summary  Diabetes (type 1 diabetes mellitus or type 2 diabetes mellitus) is a condition in which the body does not have enough of a hormone called insulin, or the body does not respond properly to insulin.  Living with diabetes puts you at risk for medical issues, and it also puts you at risk for emotional issues such as emotional distress, depression, and anxiety.  Recognizing the symptoms of emotional distress and depression may help you avoid problems with your diabetes control. It is important to start treatment for emotional distress and depression soon after they are  diagnosed.  Having diabetes means that you are at a higher risk for depression. Ask your health care provider to recommend a therapist who understands both depression and diabetes.  If you experience symptoms of emotional distress or depression, it is important to discuss this with your health care provider, certified diabetes educator, or therapist. This information is not intended to replace advice given to you by your health care provider. Make sure you discuss any questions you have with your health care provider. Document Released: 06/04/2016 Document Revised: 06/04/2016 Document Reviewed: 06/04/2016 Elsevier Interactive Patient Education  2018 [employer].

## 2017-03-30 ENCOUNTER — Telehealth: Payer: Self-pay | Admitting: Adult Health

## 2017-03-30 LAB — CLOSTRIDIUM DIFFICILE BY PCR: Toxigenic C. Difficile by PCR: NEGATIVE

## 2017-03-30 NOTE — Telephone Encounter (Signed)
Called patient to r/s 3/4 appointment. Left voicemail for patient to call us back to r/s. Ward Givens, NP requested for Janett Billow NP's 3/4 patient's to be r/s.

## 2017-04-04 ENCOUNTER — Other Ambulatory Visit: Payer: Self-pay

## 2017-04-04 ENCOUNTER — Emergency Department (HOSPITAL_COMMUNITY): Payer: Medicare HMO

## 2017-04-04 ENCOUNTER — Encounter (HOSPITAL_COMMUNITY): Payer: Self-pay

## 2017-04-04 ENCOUNTER — Inpatient Hospital Stay (HOSPITAL_COMMUNITY)
Admission: EM | Admit: 2017-04-04 | Discharge: 2017-04-06 | DRG: 194 | Disposition: A | Discharge status: Home Health | Payer: Medicare HMO | Attending: Family Medicine | Admitting: Family Medicine

## 2017-04-04 DIAGNOSIS — L539 Erythematous condition, unspecified: Secondary | ICD-10-CM | POA: Diagnosis present

## 2017-04-04 DIAGNOSIS — Z79899 Other long term (current) drug therapy: Secondary | ICD-10-CM | POA: Diagnosis not present

## 2017-04-04 DIAGNOSIS — R0602 Shortness of breath: Secondary | ICD-10-CM

## 2017-04-04 DIAGNOSIS — I2510 Atherosclerotic heart disease of native coronary artery without angina pectoris: Secondary | ICD-10-CM | POA: Diagnosis present

## 2017-04-04 DIAGNOSIS — K219 Gastro-esophageal reflux disease without esophagitis: Secondary | ICD-10-CM | POA: Diagnosis present

## 2017-04-04 DIAGNOSIS — I209 Angina pectoris, unspecified: Secondary | ICD-10-CM | POA: Diagnosis present

## 2017-04-04 DIAGNOSIS — Z8673 Personal history of transient ischemic attack (TIA), and cerebral infarction without residual deficits: Secondary | ICD-10-CM

## 2017-04-04 DIAGNOSIS — E11621 Type 2 diabetes mellitus with foot ulcer: Secondary | ICD-10-CM | POA: Diagnosis present

## 2017-04-04 DIAGNOSIS — Z89422 Acquired absence of other left toe(s): Secondary | ICD-10-CM

## 2017-04-04 DIAGNOSIS — F419 Anxiety disorder, unspecified: Secondary | ICD-10-CM | POA: Diagnosis present

## 2017-04-04 DIAGNOSIS — J189 Pneumonia, unspecified organism: Secondary | ICD-10-CM | POA: Diagnosis not present

## 2017-04-04 DIAGNOSIS — E1122 Type 2 diabetes mellitus with diabetic chronic kidney disease: Secondary | ICD-10-CM | POA: Diagnosis present

## 2017-04-04 DIAGNOSIS — G4733 Obstructive sleep apnea (adult) (pediatric): Secondary | ICD-10-CM | POA: Diagnosis present

## 2017-04-04 DIAGNOSIS — I129 Hypertensive chronic kidney disease with stage 1 through stage 4 chronic kidney disease, or unspecified chronic kidney disease: Secondary | ICD-10-CM | POA: Diagnosis present

## 2017-04-04 DIAGNOSIS — E782 Mixed hyperlipidemia: Secondary | ICD-10-CM | POA: Diagnosis present

## 2017-04-04 DIAGNOSIS — E1142 Type 2 diabetes mellitus with diabetic polyneuropathy: Secondary | ICD-10-CM | POA: Diagnosis present

## 2017-04-04 DIAGNOSIS — R160 Hepatomegaly, not elsewhere classified: Secondary | ICD-10-CM | POA: Diagnosis present

## 2017-04-04 DIAGNOSIS — L97529 Non-pressure chronic ulcer of other part of left foot with unspecified severity: Secondary | ICD-10-CM | POA: Diagnosis present

## 2017-04-04 DIAGNOSIS — Z794 Long term (current) use of insulin: Secondary | ICD-10-CM | POA: Diagnosis not present

## 2017-04-04 DIAGNOSIS — Z7902 Long term (current) use of antithrombotics/antiplatelets: Secondary | ICD-10-CM | POA: Diagnosis not present

## 2017-04-04 DIAGNOSIS — G894 Chronic pain syndrome: Secondary | ICD-10-CM | POA: Diagnosis present

## 2017-04-04 DIAGNOSIS — N183 Chronic kidney disease, stage 3 (moderate): Secondary | ICD-10-CM | POA: Diagnosis present

## 2017-04-04 DIAGNOSIS — I452 Bifascicular block: Secondary | ICD-10-CM | POA: Diagnosis present

## 2017-04-04 DIAGNOSIS — J181 Lobar pneumonia, unspecified organism: Secondary | ICD-10-CM | POA: Diagnosis not present

## 2017-04-04 DIAGNOSIS — Z89421 Acquired absence of other right toe(s): Secondary | ICD-10-CM | POA: Diagnosis not present

## 2017-04-04 DIAGNOSIS — L97519 Non-pressure chronic ulcer of other part of right foot with unspecified severity: Secondary | ICD-10-CM | POA: Diagnosis present

## 2017-04-04 DIAGNOSIS — I700 Atherosclerosis of aorta: Secondary | ICD-10-CM | POA: Diagnosis present

## 2017-04-04 LAB — COMPREHENSIVE METABOLIC PANEL
ALBUMIN: 2.9 g/dL — AB (ref 3.5–5.0)
ALK PHOS: 115 U/L (ref 38–126)
ALT: 22 U/L (ref 14–54)
ANION GAP: 10 (ref 5–15)
AST: 45 U/L — AB (ref 15–41)
BUN: 27 mg/dL — AB (ref 6–20)
CALCIUM: 8.6 mg/dL — AB (ref 8.9–10.3)
CO2: 21 mmol/L — ABNORMAL LOW (ref 22–32)
CREATININE: 1.57 mg/dL — AB (ref 0.44–1.00)
Chloride: 101 mmol/L (ref 101–111)
GFR calc Af Amer: 40 mL/min — ABNORMAL LOW (ref >60)
GFR calc non Af Amer: 34 mL/min — ABNORMAL LOW (ref >60)
GLUCOSE: 212 mg/dL — AB (ref 65–99)
Potassium: 5 mmol/L (ref 3.5–5.1)
Sodium: 132 mmol/L — ABNORMAL LOW (ref 135–145)
Total Bilirubin: 1.6 mg/dL — ABNORMAL HIGH (ref 0.3–1.2)
Total Protein: 6.6 g/dL (ref 6.5–8.1)

## 2017-04-04 LAB — URINALYSIS, ROUTINE W REFLEX MICROSCOPIC
BACTERIA UA: NONE SEEN
BILIRUBIN URINE: NEGATIVE
Glucose, UA: NEGATIVE mg/dL
HGB URINE DIPSTICK: NEGATIVE
KETONES UR: NEGATIVE mg/dL
LEUKOCYTES UA: NEGATIVE
NITRITE: NEGATIVE
Protein, ur: 100 mg/dL — AB
RBC / HPF: NONE SEEN RBC/hpf (ref 0–5)
Specific Gravity, Urine: 1.019 (ref 1.005–1.030)
WBC, UA: NONE SEEN WBC/hpf (ref 0–5)
pH: 5 (ref 5.0–8.0)

## 2017-04-04 LAB — CBC WITH DIFFERENTIAL/PLATELET
BASOS ABS: 0 10*3/uL (ref 0.0–0.1)
Basophils Relative: 0 %
EOS PCT: 1 %
Eosinophils Absolute: 0 10*3/uL (ref 0.0–0.7)
HEMATOCRIT: 36.3 % (ref 36.0–46.0)
Hemoglobin: 12 g/dL (ref 12.0–15.0)
Lymphocytes Relative: 7 %
Lymphs Abs: 0.5 10*3/uL — ABNORMAL LOW (ref 0.7–4.0)
MCH: 27.5 pg (ref 26.0–34.0)
MCHC: 33.1 g/dL (ref 30.0–36.0)
MCV: 83.1 fL (ref 78.0–100.0)
Monocytes Absolute: 0.4 10*3/uL (ref 0.1–1.0)
Monocytes Relative: 5 %
NEUTROS ABS: 7.1 10*3/uL (ref 1.7–7.7)
Neutrophils Relative %: 87 %
PLATELETS: 188 10*3/uL (ref 150–400)
RBC: 4.37 MIL/uL (ref 3.87–5.11)
RDW: 14.3 % (ref 11.5–15.5)
WBC: 8.1 10*3/uL (ref 4.0–10.5)

## 2017-04-04 LAB — I-STAT CG4 LACTIC ACID, ED
Lactic Acid, Venous: 1.61 mmol/L (ref 0.5–1.9)
Lactic Acid, Venous: 1.38 mmol/L (ref 0.5–1.9)

## 2017-04-04 LAB — PROCALCITONIN: Procalcitonin: 0.3 ng/mL

## 2017-04-04 MED ORDER — INSULIN ASPART 100 UNIT/ML ~~LOC~~ SOLN
0.0000 [IU] | Freq: Three times a day (TID) | SUBCUTANEOUS | Status: DC
  Administered 2017-04-05 (×2): 7 [IU] via SUBCUTANEOUS
  Administered 2017-04-05 – 2017-04-06 (×2): 11 [IU] via SUBCUTANEOUS
  Administered 2017-04-06: 7 [IU] via SUBCUTANEOUS

## 2017-04-04 MED ORDER — BUSPIRONE HCL 15 MG PO TABS
15.0000 mg | ORAL_TABLET | Freq: Three times a day (TID) | ORAL | Status: DC
  Administered 2017-04-05 – 2017-04-06 (×6): 15 mg via ORAL
  Filled 2017-04-04 (×6): qty 1
  Filled 2017-04-04: qty 2

## 2017-04-04 MED ORDER — POLYETHYLENE GLYCOL 3350 17 G PO PACK: 17.0000 g | PACK | Freq: Every day | ORAL | Status: DC | PRN

## 2017-04-04 MED ORDER — TIZANIDINE HCL 2 MG PO TABS
4.0000 mg | ORAL_TABLET | Freq: Three times a day (TID) | ORAL | Status: DC
  Administered 2017-04-05 – 2017-04-06 (×6): 4 mg via ORAL
  Filled 2017-04-04 (×3): qty 2
  Filled 2017-04-04: qty 1
  Filled 2017-04-04 (×2): qty 2

## 2017-04-04 MED ORDER — SODIUM CHLORIDE 0.9% FLUSH
3.0000 mL | Freq: Two times a day (BID) | INTRAVENOUS | Status: DC
  Administered 2017-04-05 – 2017-04-06 (×3): 3 mL via INTRAVENOUS

## 2017-04-04 MED ORDER — SODIUM CHLORIDE 0.9 % IV SOLN: 250.0000 mL | INTRAVENOUS | Status: DC | PRN

## 2017-04-04 MED ORDER — GABAPENTIN 300 MG PO CAPS
300.0000 mg | ORAL_CAPSULE | Freq: Three times a day (TID) | ORAL | Status: DC
  Administered 2017-04-05 (×2): 300 mg via ORAL
  Filled 2017-04-04 (×2): qty 1

## 2017-04-04 MED ORDER — TAPENTADOL HCL 75 MG PO TABS
75.0000 mg | ORAL_TABLET | Freq: Three times a day (TID) | ORAL | Status: DC
Start: 2017-04-05 — End: 2017-04-05

## 2017-04-04 MED ORDER — CLOPIDOGREL BISULFATE 75 MG PO TABS
75.0000 mg | ORAL_TABLET | Freq: Every day | ORAL | Status: DC
  Administered 2017-04-05 – 2017-04-06 (×2): 75 mg via ORAL
  Filled 2017-04-04 (×2): qty 1

## 2017-04-04 MED ORDER — SODIUM CHLORIDE 0.9% FLUSH: 3.0000 mL | INTRAVENOUS | Status: DC | PRN

## 2017-04-04 MED ORDER — SODIUM CHLORIDE 0.9 % IV SOLN
1.0000 g | Freq: Once | INTRAVENOUS | Status: AC
  Administered 2017-04-04: 1 g via INTRAVENOUS
  Filled 2017-04-04: qty 10

## 2017-04-04 MED ORDER — SODIUM CHLORIDE 0.9 % IV SOLN
500.0000 mg | Freq: Once | INTRAVENOUS | Status: AC
  Administered 2017-04-04: 500 mg via INTRAVENOUS
  Filled 2017-04-04: qty 500

## 2017-04-04 MED ORDER — RANOLAZINE ER 500 MG PO TB12
500.0000 mg | ORAL_TABLET | Freq: Two times a day (BID) | ORAL | Status: DC
  Administered 2017-04-05 – 2017-04-06 (×4): 500 mg via ORAL
  Filled 2017-04-04 (×5): qty 1

## 2017-04-04 MED ORDER — HEPARIN SODIUM (PORCINE) 5000 UNIT/ML IJ SOLN
5000.0000 [IU] | Freq: Three times a day (TID) | INTRAMUSCULAR | Status: DC
  Administered 2017-04-05 – 2017-04-06 (×4): 5000 [IU] via SUBCUTANEOUS
  Filled 2017-04-04 (×5): qty 1

## 2017-04-04 MED ORDER — ATORVASTATIN CALCIUM 80 MG PO TABS
80.0000 mg | ORAL_TABLET | Freq: Every day | ORAL | Status: DC
  Administered 2017-04-05: 80 mg via ORAL
  Filled 2017-04-04: qty 1

## 2017-04-04 MED ORDER — SERTRALINE HCL 50 MG PO TABS
50.0000 mg | ORAL_TABLET | Freq: Every day | ORAL | Status: DC
  Administered 2017-04-05 – 2017-04-06 (×2): 50 mg via ORAL
  Filled 2017-04-04 (×2): qty 1

## 2017-04-04 MED ORDER — INSULIN GLARGINE 100 UNIT/ML ~~LOC~~ SOLN
100.0000 [IU] | Freq: Every morning | SUBCUTANEOUS | Status: DC
  Administered 2017-04-05 – 2017-04-06 (×2): 100 [IU] via SUBCUTANEOUS
  Filled 2017-04-04 (×3): qty 1

## 2017-04-04 MED ORDER — EZETIMIBE 10 MG PO TABS
10.0000 mg | ORAL_TABLET | Freq: Every day | ORAL | Status: DC
  Administered 2017-04-05 – 2017-04-06 (×2): 10 mg via ORAL
  Filled 2017-04-04 (×2): qty 1

## 2017-04-04 MED ORDER — SODIUM CHLORIDE 0.9 % IV BOLUS (SEPSIS)
1000.0000 mL | Freq: Once | INTRAVENOUS | Status: AC
Start: 2017-04-04 — End: 2017-04-04
  Administered 2017-04-04: 1000 mL via INTRAVENOUS

## 2017-04-04 MED ORDER — ACETAMINOPHEN 500 MG PO TABS
1000.0000 mg | ORAL_TABLET | Freq: Once | ORAL | Status: AC
  Administered 2017-04-04: 1000 mg via ORAL
  Filled 2017-04-04: qty 2

## 2017-04-04 MED ORDER — CARVEDILOL 25 MG PO TABS
25.0000 mg | ORAL_TABLET | Freq: Two times a day (BID) | ORAL | Status: DC
  Administered 2017-04-05 – 2017-04-06 (×4): 25 mg via ORAL
  Filled 2017-04-04 (×3): qty 1
  Filled 2017-04-04: qty 2

## 2017-04-04 MED ORDER — POLYETHYLENE GLYCOL 3350 17 G PO PACK: 17.0000 g | PACK | Freq: Every day | ORAL | Status: DC

## 2017-04-04 NOTE — ED Notes (Signed)
Called lab to add on

## 2017-04-04 NOTE — ED Provider Notes (Signed)
[facility] EMERGENCY DEPARTMENT Provider Note   CSN: [id number] Arrival date & time: 04/04/17  1538     History   Chief Complaint Chief Complaint  Patient presents with  . Weakness    HPI [name] is a 62 y.o. female.  Chief complaint is fever, and weakness.  HPI: 62 year old female.  She has been in her normal state of health until today.  She was at the store.  She started feeling weakness.  Shakes and chills.  She has had a cough for a few days.  Presents here febrile and hypoxemic.  He said as her symptoms progressed she just felt progressive weakness.  Not syncopal or presyncopal just generalized weakness.  No unilateral symptoms.  No CVA symptoms.  No chest pain.   Past Medical History:  Diagnosis Date  . Allergy   . Arthritis   . Chronic kidney disease   . Chronic pain   . Coronary artery disease   . Diabetes mellitus without complication ([place])   . Dyspnea   . GERD (gastroesophageal reflux disease)   . Heart murmur   . Hypertension   . Liver disease   . NASH (nonalcoholic steatohepatitis) 09/16/2016  . Obstructive sleep apnea   . Osteomyelitis ([place])   . Peripheral vascular disease ([place])   . Sleep apnea   . Stroke ([place])   . Ulcers of both great toes ([place]) 11/07/2016   right great toe, started as finger nail size and grew in 6 days     Patient Active Problem List   Diagnosis Date Noted  . Community acquired pneumonia 04/04/2017  . Chest pain 03/08/2017  . Aortic atherosclerosis ([place]) 01/13/2017  . At high risk for falls 01/13/2017  . Uncontrolled type 2 diabetes mellitus with hyperglycemia ([place]) 01/13/2017  . Stroke-like symptoms   . Complicated migraine   . Mixed hyperlipidemia   . Generalized weakness   . TIA (transient ischemic attack) 01/02/2017  . Ulcer of toe of right foot, limited to breakdown of skin ([place]) 12/18/2016  . Non-pressure chronic ulcer of other part of right foot limited to breakdown of skin ([place]) 12/07/2016    . Diabetic foot ulcer ([place]) 12/04/2016  . Hypovolemia 12/04/2016  . Chronic pain syndrome 12/04/2016  . Diabetic peripheral neuropathy ([place]) 12/04/2016  . Hypotension   . Liver mass 09/20/2016  . Kidney disease, chronic, stage III (GFR 30-59 ml/min) ([facility]) 12/17/2015  . Diabetes mellitus ([place]) 07/18/2015  . Essential hypertension 06/06/2015  . Chronic back pain 07/27/2012  . OSA on CPAP 03/21/2012    Past Surgical History:  Procedure Laterality Date  . ABDOMINAL HYSTERECTOMY    . BACK SURGERY    . CESAREAN SECTION    . CHOLECYSTECTOMY    . HERNIA REPAIR    . JOINT REPLACEMENT    . KNEE SURGERY    . SPINE SURGERY    . TOE AMPUTATION    . TOE AMPUTATION Bilateral    3 on right foot 2 left foot   . TONSILLECTOMY    . TUBAL LIGATION      OB History    No data available       Home Medications    Prior to Admission medications   Medication Sig Start Date End Date Taking? Authorizing Provider  atorvastatin (LIPITOR) 80 MG tablet Take 1 tablet (80 mg total) by mouth daily at 6 PM. 02/08/17  Yes Stallings, Zoe A, MD  busPIRone (BUSPAR) 15 MG tablet Take 1 tablet (15 mg  total) by mouth 3 (three) times daily. 02/08/17  Yes Forrest Moron, MD  carvedilol (COREG) 25 MG tablet Take 1 tablet (25 mg total) by mouth 2 (two) times daily. 11/23/16  Yes Delia Chimes A, MD  clopidogrel (PLAVIX) 75 MG tablet Take 1 tablet (75 mg total) by mouth daily. 02/08/17  Yes Stallings, Zoe A, MD  exenatide (BYETTA 5 MCG PEN) 5 MCG/0.02ML SOPN injection Inject 0.02 mLs (5 mcg total) into the skin 2 (two) times daily with a meal. 11/23/16  Yes Stallings, Zoe A, MD  ezetimibe (ZETIA) 10 MG tablet Take 1 tablet (10 mg total) by mouth daily. 02/08/17  Yes Delia Chimes A, MD  gabapentin (NEURONTIN) 300 MG capsule Take 1 capsule (300 mg total) by mouth 3 (three) times daily. 12/28/16  Yes Stallings, Zoe A, MD  insulin aspart (NOVOLOG) 100 UNIT/ML FlexPen Inject 10 Units into the skin 3 (three) times daily  before meals. "PER A SLIDING SCALE OF AN ADDITIONAL 2-10 UNITS" 01/01/17  Yes Stallings, Zoe A, MD  LANTUS SOLOSTAR 100 UNIT/ML Solostar Pen Inject 200 Units into the skin every morning. 03/01/17  Yes Renato Shin, MD  NUCYNTA 75 MG tablet Take 75 mg by mouth 3 (three) times daily. 03/13/17  Yes [provider]  ranolazine (RANEXA) 500 MG 12 hr tablet Take 1 tablet (500 mg total) by mouth 2 (two) times daily. 03/09/17  Yes Park Liter, MD  sertraline (ZOLOFT) 50 MG tablet Take 1 tablet (50 mg total) by mouth daily. 11/23/16  Yes Stallings, Zoe A, MD  tiZANidine (ZANAFLEX) 4 MG tablet Take 4 mg by mouth 3 (three) times daily.   Yes [provider]  BD ULTRA-FINE PEN NEEDLES 29G X 12.7MM MISC  02/22/17   [provider]  cephALEXin (KEFLEX) 500 MG capsule Take 1 capsule (500 mg total) by mouth 2 (two) times daily. Patient not taking: Reported on 04/04/2017 03/26/17   Rutherford Guys, MD  clindamycin (CLEOCIN) 300 MG capsule Take 1 capsule (300 mg total) by mouth 4 (four) times daily. Patient not taking: Reported on 04/04/2017 03/22/17   Okey Regal, PA-C    Family History Family History  Problem Relation Age of Onset  . Cancer Mother        Thymoma, metastatic  . Heart disease Father   . Stroke Father   . Stroke Maternal Grandfather   . Heart disease Paternal Grandfather   . Multiple sclerosis Sister   . Epilepsy Sister   . Diabetes Neg Hx     Social History Social History   Tobacco Use  . Smoking status: Never Smoker  . Smokeless tobacco: Never Used  Substance Use Topics  . Alcohol use: No  . Drug use: No     Allergies   Lasix [furosemide]; Norvasc [amlodipine besylate]; Prednisone; Vancomycin; Doxycycline; Clindamycin/lincomycin; Ibuprofen; Inderal [propranolol]; Lisinopril; Lovastatin; Norvasc [amlodipine besylate]; Nsaids; and Sulfa antibiotics   Review of Systems Review of Systems  Constitutional: Positive for fatigue and fever. Negative  for appetite change, chills and diaphoresis.  HENT: Negative for mouth sores, sore throat and trouble swallowing.   Eyes: Negative for visual disturbance.  Respiratory: Positive for cough and shortness of breath. Negative for chest tightness and wheezing.   Cardiovascular: Negative for chest pain.  Gastrointestinal: Negative for abdominal distention, abdominal pain, diarrhea, nausea and vomiting.  Endocrine: Negative for polydipsia, polyphagia and polyuria.  Genitourinary: Negative for dysuria, frequency and hematuria.  Musculoskeletal: Negative for gait problem.  Skin: Negative for color change,  pallor and rash.  Neurological: Positive for weakness. Negative for dizziness, syncope, light-headedness and headaches.  Hematological: Does not bruise/bleed easily.  Psychiatric/Behavioral: Negative for behavioral problems and confusion.     Physical Exam Updated Vital Signs BP 135/83   Pulse 70   Temp (!) 103.8 F (39.9 C) (Rectal)   Resp 16   Ht 5' 7"  (1.702 m)   Wt 107.5 kg (237 lb)   SpO2 94%   BMI 37.12 kg/m   Physical Exam  Constitutional: She is oriented to person, place, and time. She appears well-developed and well-nourished. No distress.  Resting tachycardia 103.  84% on room air.  Appears to not feel well but awake and alert and mentating well.  HENT:  Head: Normocephalic.  Eyes: Conjunctivae are normal. Pupils are equal, round, and reactive to light. No scleral icterus.  Neck: Normal range of motion. Neck supple. No thyromegaly present.  Cardiovascular: Normal rate and regular rhythm. Exam reveals no gallop and no friction rub.  No murmur heard. Pulmonary/Chest: Effort normal and breath sounds normal. No respiratory distress. She has no wheezes. She has no rales.  Crackles right base greater than left.  Abdominal: Soft. Bowel sounds are normal. She exhibits no distension. There is no tenderness. There is no rebound.  Musculoskeletal: Normal range of motion.    Neurological: She is alert and oriented to person, place, and time.  Skin: Skin is warm and dry. No rash noted.  Psychiatric: She has a normal mood and affect. Her behavior is normal.     ED Treatments / Results  Labs (all labs ordered are listed, but only abnormal results are displayed) Labs Reviewed  COMPREHENSIVE METABOLIC PANEL - Abnormal; Notable for the following components:      Result Value   Sodium 132 (*)    CO2 21 (*)    Glucose, Bld 212 (*)    BUN 27 (*)    Creatinine, Ser 1.57 (*)    Calcium 8.6 (*)    Albumin 2.9 (*)    AST 45 (*)    Total Bilirubin 1.6 (*)    GFR calc non Af Amer 34 (*)    GFR calc Af Amer 40 (*)    All other components within normal limits  CBC WITH DIFFERENTIAL/PLATELET - Abnormal; Notable for the following components:   Lymphs Abs 0.5 (*)    All other components within normal limits  URINALYSIS, ROUTINE W REFLEX MICROSCOPIC - Abnormal; Notable for the following components:   Protein, ur 100 (*)    Squamous Epithelial / LPF 0-5 (*)    All other components within normal limits  URINE CULTURE  CULTURE, BLOOD (ROUTINE X 2)  CULTURE, BLOOD (ROUTINE X 2)  I-STAT CG4 LACTIC ACID, ED  I-STAT CG4 LACTIC ACID, ED    EKG  EKG Interpretation None       Radiology Dg Chest Port 1 View  Result Date: 04/04/2017 CLINICAL DATA:  Weakness EXAM: PORTABLE CHEST 1 VIEW COMPARISON:  09/30/2016 FINDINGS: Mild increased interstitial and patchy airspace disease at the lung bases. No pleural effusion. Normal heart size. No pneumothorax. IMPRESSION: Mild increased interstitial and patchy airspace disease at the bases, could reflect minimal atelectasis versus infiltrates. Electronically Signed   By: Donavan Foil M.D.   On: 04/04/2017 18:06    Procedures Procedures (including critical care time)  Medications Ordered in ED Medications  acetaminophen (TYLENOL) tablet 1,000 mg (1,000 mg Oral Given 04/04/17 1733)  cefTRIAXone (ROCEPHIN) 1 g in sodium chloride  0.9 %  100 mL IVPB (0 g Intravenous Stopped 04/04/17 1931)  azithromycin (ZITHROMAX) 500 mg in sodium chloride 0.9 % 250 mL IVPB (0 mg Intravenous Stopped 04/04/17 2038)  sodium chloride 0.9 % bolus 1,000 mL (0 mLs Intravenous Stopped 04/04/17 2038)     Initial Impression / Assessment and Plan / ED Course  I have reviewed the triage vital signs and the nursing notes.  Pertinent labs & imaging results that were available during my care of the patient were reviewed by me and considered in my medical decision making (see chart for details).     Given IV fluids.  Given Tylenol.  Heart rate improved.  Temperature improved.  Continued hypoxemia requiring 1-2 L of nasal cannula O2.  Has subtle right lower lobe infiltrates.  Care discussed with admitting physicians.  Given IV Rocephin and Zithromax for community acquired pneumonia.  Final Clinical Impressions(s) / ED Diagnoses   Final diagnoses:  Community acquired pneumonia of right lower lobe of lung [facility])    ED Discharge Orders    None       Tanna Furry, MD 04/04/17 2234

## 2017-04-04 NOTE — ED Triage Notes (Signed)
Pt brought in by [facility] from home for increase in generalized weakness x3 hours. Pt was d/c'd from [facility] last week following hospitalized for sepsis r/t cellulitis in right leg. Per EMS pt was pale, diaphoretic and had a fever at home. Pt denies SOB. Endorses nausea and chest wall pain x3 months with a cough. Pt is A+Ox4. Per family pt is slower to move and slower to respond than per her usual.

## 2017-04-04 NOTE — H&P (Addendum)
[facility] Admission History and Physical Service Pager: [phone]  Patient name: [name] Medical record number: [id number] Date of birth: 19-Mar-1955 Age: 62 y.o. Gender: female  Primary Care Provider: Forrest Moron, MD Consultants: none Code Status: Full  Chief Complaint: fever, fatigue  Assessment and Plan: [name] is a 62 y.o. female presenting with fatigue, fever and body aches x1 day. PMH is significant for Type 2 diabetes with R and L diabetic foot ulcers, peripheral neuropathy, HTN, CKD stage 3, NASH, OSA w/ CPAP, H/o  CVA and TIAs, anxiety.  CAP: Patient with new fever and SOB requiring new supplemental O2 requirement in ED. CXR Mild increased interstitial and patchy airspace disease at the bases, could reflect minimal atelectasis versus infiltrates. Patient also with flu-like symptoms including fever to 103 F and body-aches that started today. Influenza pending. Patient given rocephin and azithro in ED for CAP. Not considered HCAP because last hospitalization was in December then discharged home with home health. Does have recent antibiotic use of right lower leg cellulitis (s/p keflex and clindamycin -- could not tolerate latter due to rash). Procalcitonin 0.3 recommending continuing antibiotics in setting of lower respiratory tract infection per algorithm, will re-evaluate tomorrow based on influenza panel. - Admit to med-surg, attending Dr. McDiarmid - s/p rocephin and azithro x1 - Droplet precautions  - F/u influenza and consider Tamiflu as it has been <48 hours - Monitor respiratory status - Supplemental O2 to mainitian O2 >92% - Vitals q4 with pulse ox - prn tylenol for fever  T2DM: Last A1c 12.9 on 03/26/2017- followed by [facility]. Has 2 diabetic foot ulcers on L and R foot treated by Dr. Sharol Given.  Takes byetta 75mg BID, novolog 10U TID with SSI, lantus 200U every morning.  -rSSI -lantus 100U qAM -continue to monitor  and adjust as appropriate  HTN:  -continue coreg 274mBID daily  CKD, stage III: Stable. Cr 1.57 (baseline ~1.5-1.6) -avoid nephrotoxic agents -continue to monitor  OSA:  - CPAP qHS  Chronic pain: Takes tapentadol 7546mID and zanaflex 4mg45mD. [facility] - continue current regimen - Miralax as needed for constipation  Angina: Stable. 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 - Continue home ranexa 500 mg BID  H/o  CVA and TIAs: - continue home plavix 75 mg  Anxiety: Stable -continue buspar 15 mg TID and zoloft 50mg17mly  Peripheral neuropathy: - Continue home gabapentin 300mg 34mbut based on weight-adjusted CrCl (~46 ml/min) consider decreasing to BID dosing  Hyperlipidemia: - continue home Lipitor 80mg d66m and Zetia 10mg da34m RLE erythema and swelling: Cellulitis vs venous stasis. Vascular US negatKoreae for DVT on 03/22/17. S/p keflex.  - Encourage frequent elevation of legs  FEN/GI: Heart Healthy/ Carb modified Prophylaxis:heparin  Disposition: admit to med-surg  History of Present Illness:  [name] is a 62 y.o. 63male presenting with SOB and fatigue.  Patient felt short of breath and very fatigued this morning, and this got worse after going out with a friend. Friend had to take her home, and patient says she could barely walk. No one has been sick around her. She was hospitalized in late November but has had no hospitalizations since last time. Patient was able to eat breakfast but has not had anything since then. Reports fevers and body-aches today with temperature to 103 F. She has not taken any of her medications since lunch. Patient reports that she is just feeling  weak all over. She has cough but says it is non-productive.   Review Of Systems: Per HPI with the following additions:   Review of Systems  Constitutional: Positive for chills and fever.  HENT: Negative for congestion and sore throat.    Respiratory: Positive for cough and shortness of breath. Negative for sputum production.   Cardiovascular: Positive for chest pain and leg swelling (Right leg).  Gastrointestinal: Positive for nausea. Negative for diarrhea and vomiting.  Genitourinary: Negative for dysuria.  Musculoskeletal: Positive for myalgias.  Skin: Negative for rash.  Neurological: Positive for weakness and headaches. Negative for focal weakness.   Patient Active Problem List   Diagnosis Date Noted  . Community acquired pneumonia 04/04/2017  . Chest pain 03/08/2017  . Aortic atherosclerosis ([place]) 01/13/2017  . At high risk for falls 01/13/2017  . Uncontrolled type 2 diabetes mellitus with hyperglycemia ([place]) 01/13/2017  . Stroke-like symptoms   . Complicated migraine   . Mixed hyperlipidemia   . Generalized weakness   . TIA (transient ischemic attack) 01/02/2017  . Ulcer of toe of right foot, limited to breakdown of skin ([place]) 12/18/2016  . Non-pressure chronic ulcer of other part of right foot limited to breakdown of skin ([place]) 12/07/2016  . Diabetic foot ulcer ([place]) 12/04/2016  . Hypovolemia 12/04/2016  . Chronic pain syndrome 12/04/2016  . Diabetic peripheral neuropathy ([place]) 12/04/2016  . Hypotension   . Liver mass 09/20/2016  . Kidney disease, chronic, stage III (GFR 30-59 ml/min) ([facility]) 12/17/2015  . Diabetes mellitus ([place]) 07/18/2015  . Essential hypertension 06/06/2015  . Chronic back pain 07/27/2012  . OSA on CPAP 03/21/2012    Past Medical History: Past Medical History:  Diagnosis Date  . Allergy   . Arthritis   . Chronic kidney disease   . Chronic pain   . Coronary artery disease   . Diabetes mellitus without complication ([place])   . Dyspnea   . GERD (gastroesophageal reflux disease)   . Heart murmur   . Hypertension   . Liver disease   . NASH (nonalcoholic steatohepatitis) 09/16/2016  . Obstructive sleep apnea   . Osteomyelitis ([place])   . Peripheral vascular disease ([place])   . Sleep  apnea   . Stroke ([place])   . Ulcers of both great toes ([place]) 11/07/2016   right great toe, started as finger nail size and grew in 6 days     Past Surgical History: Past Surgical History:  Procedure Laterality Date  . ABDOMINAL HYSTERECTOMY    . BACK SURGERY    . CESAREAN SECTION    . CHOLECYSTECTOMY    . HERNIA REPAIR    . JOINT REPLACEMENT    . KNEE SURGERY    . SPINE SURGERY    . TOE AMPUTATION    . TOE AMPUTATION Bilateral    3 on right foot 2 left foot   . TONSILLECTOMY    . TUBAL LIGATION      Social History: Social History   Tobacco Use  . Smoking status: Never Smoker  . Smokeless tobacco: Never Used  Substance Use Topics  . Alcohol use: No  . Drug use: No   Additional social history: Never smoker Please also refer to relevant sections of EMR.  Family History: Family History  Problem Relation Age of Onset  . Cancer Mother        Thymoma, metastatic  . Heart disease Father   . Stroke Father   . Stroke Maternal Grandfather   . Heart disease  Paternal Grandfather   . Multiple sclerosis Sister   . Epilepsy Sister   . Diabetes Neg Hx     Allergies and Medications: Allergies  Allergen Reactions  . Lasix [Furosemide]     Dehydration leading to kidney failure   . Norvasc [Amlodipine Besylate]     Severe edema  . Prednisone Other (See Comments)    Ketoacidosis  . Vancomycin Other (See Comments)    Total organ shut down  . Doxycycline Hives  . Ibuprofen Other (See Comments)    WAS TOLD TO NOT TAKE THIS BECAUSE OF HER KIDNEYS AND LIVER  . Inderal [Propranolol] Cough  . Lisinopril Other (See Comments)    WAS TOLD TO NOT TAKE THIS BECAUSE OF HER KIDNEYS AND LIVER  . Lovastatin Other (See Comments)    WAS TOLD TO NOT TAKE THIS BECAUSE OF HER KIDNEYS AND LIVER  . Norvasc [Amlodipine Besylate] Other (See Comments)    Severe edema  . Nsaids Other (See Comments)    WAS TOLD TO NOT TAKE THIS BECAUSE OF HER KIDNEYS AND LIVER  . Sulfa Antibiotics Nausea Only    No current facility-administered medications on file prior to encounter.    Current Outpatient Medications on File Prior to Encounter  Medication Sig Dispense Refill  . atorvastatin (LIPITOR) 80 MG tablet Take 1 tablet (80 mg total) by mouth daily at 6 PM. 90 tablet 3  . BD ULTRA-FINE PEN NEEDLES 29G X 12.7MM MISC   0  . busPIRone (BUSPAR) 15 MG tablet Take 1 tablet (15 mg total) by mouth 3 (three) times daily. 90 tablet 3  . carvedilol (COREG) 25 MG tablet Take 1 tablet (25 mg total) by mouth 2 (two) times daily. 180 tablet 1  . cephALEXin (KEFLEX) 500 MG capsule Take 1 capsule (500 mg total) by mouth 2 (two) times daily. 10 capsule 0  . clindamycin (CLEOCIN) 300 MG capsule Take 1 capsule (300 mg total) by mouth 4 (four) times daily. 40 capsule 0  . clopidogrel (PLAVIX) 75 MG tablet Take 1 tablet (75 mg total) by mouth daily. 90 tablet 3  . exenatide (BYETTA 5 MCG PEN) 5 MCG/0.02ML SOPN injection Inject 0.02 mLs (5 mcg total) into the skin 2 (two) times daily with a meal. 1.2 mL 3  . ezetimibe (ZETIA) 10 MG tablet Take 1 tablet (10 mg total) by mouth daily. 90 tablet 3  . gabapentin (NEURONTIN) 300 MG capsule Take 1 capsule (300 mg total) by mouth 3 (three) times daily. 90 capsule 3  . insulin aspart (NOVOLOG) 100 UNIT/ML FlexPen Inject 10 Units into the skin 3 (three) times daily before meals. "PER A SLIDING SCALE OF AN ADDITIONAL 2-10 UNITS" 15 mL 3  . LANTUS SOLOSTAR 100 UNIT/ML Solostar Pen Inject 200 Units into the skin every morning. 25 pen 11  . ranolazine (RANEXA) 500 MG 12 hr tablet Take 1 tablet (500 mg total) by mouth 2 (two) times daily. 60 tablet 6  . sertraline (ZOLOFT) 50 MG tablet Take 1 tablet (50 mg total) by mouth daily. 90 tablet 1  . tiZANidine (ZANAFLEX) 4 MG tablet Take 4 mg by mouth 3 (three) times daily.      Objective: BP 130/65   Pulse 72   Temp (!) 103.8 F (39.9 C) (Rectal)   Resp 13   Ht 5' 7"  (1.702 m)   Wt 237 lb (107.5 kg)   SpO2 97%   BMI 37.12  kg/m  Exam: General: NAD, pleasant Eyes: PERRL, EOMI, no conjunctival  pallor or injection ENTM: Moist mucous membranes, no pharyngeal erythema or exudate; tonsils surgically absent Neck: Supple, mild cervical lymphadenopathy  Cardiovascular: RRR, no m/r/g, 2+ pitting edema on LLE, RLE with trace edema Respiratory: CTA BL, normal work of breathing, difficult exam due to body habitus Gastrointestinal: soft, nontender, distended. Diastasis recti, hypoactive BS MSK: moves 4 extremities equally, R lower leg swollen compared to L but with less edema, toe amputations Derm: Erythema of R lower leg above ankle to mid-tibia; chronically darkened ulcer over left forefoot Neuro: CN II-XII grossly intact, no focal deficits Psych: AOx3, appropriate affect  Labs and Imaging: CBC BMET  Recent Labs  Lab 04/04/17 1552  WBC 8.1  HGB 12.0  HCT 36.3  PLT 188   Recent Labs  Lab 04/04/17 1552  NA 132*  K 5.0  CL 101  CO2 21*  BUN 27*  CREATININE 1.57*  GLUCOSE 212*  CALCIUM 8.6*     Albumin 2.9 AST 45 ALT 22 Total Bilirubin 1.6 Blood culture pending Urine culture pending Procalcitonin 0.30 UA with 100 protein and otherwise wnl  Shirley, [place], DO 04/04/2017, 9:43 PM PGY-1, [place] Intern pager: [phone], text pages welcome  Upper Level Addendum:  I have seen and evaluated this patient along with Dr. Enid Derry and reviewed the above note, making necessary revisions in green.  Rogue Bussing, MD PGY-3,  [facility] 04/05/2017 12:39 AM

## 2017-04-04 NOTE — ED Notes (Signed)
Pt made aware that a UA (as well as blood work) needs to be collected.

## 2017-04-04 NOTE — ED Notes (Signed)
Portable xray at bedside.

## 2017-04-05 ENCOUNTER — Inpatient Hospital Stay (HOSPITAL_COMMUNITY): Payer: Medicare HMO

## 2017-04-05 ENCOUNTER — Ambulatory Visit: Payer: Medicare HMO | Admitting: Adult Health

## 2017-04-05 DIAGNOSIS — J181 Lobar pneumonia, unspecified organism: Secondary | ICD-10-CM

## 2017-04-05 LAB — CBC
HCT: 37.1 % (ref 36.0–46.0)
Hemoglobin: 12.2 g/dL (ref 12.0–15.0)
MCH: 27.8 pg (ref 26.0–34.0)
MCHC: 32.9 g/dL (ref 30.0–36.0)
MCV: 84.5 fL (ref 78.0–100.0)
PLATELETS: 159 10*3/uL (ref 150–400)
RBC: 4.39 MIL/uL (ref 3.87–5.11)
RDW: 14.4 % (ref 11.5–15.5)
WBC: 6.5 10*3/uL (ref 4.0–10.5)

## 2017-04-05 LAB — BASIC METABOLIC PANEL
Anion gap: 12 (ref 5–15)
BUN: 25 mg/dL — ABNORMAL HIGH (ref 6–20)
CALCIUM: 8.3 mg/dL — AB (ref 8.9–10.3)
CHLORIDE: 105 mmol/L (ref 101–111)
CO2: 20 mmol/L — ABNORMAL LOW (ref 22–32)
CREATININE: 1.52 mg/dL — AB (ref 0.44–1.00)
GFR calc non Af Amer: 36 mL/min — ABNORMAL LOW (ref >60)
GFR, EST AFRICAN AMERICAN: 41 mL/min — AB (ref >60)
Glucose, Bld: 155 mg/dL — ABNORMAL HIGH (ref 65–99)
Potassium: 3.9 mmol/L (ref 3.5–5.1)
SODIUM: 137 mmol/L (ref 135–145)

## 2017-04-05 LAB — GLUCOSE, CAPILLARY
GLUCOSE-CAPILLARY: 205 mg/dL — AB (ref 65–99)
GLUCOSE-CAPILLARY: 298 mg/dL — AB (ref 65–99)
GLUCOSE-CAPILLARY: 241 mg/dL — AB (ref 65–99)
GLUCOSE-CAPILLARY: 182 mg/dL — AB (ref 65–99)

## 2017-04-05 LAB — RAPID URINE DRUG SCREEN, HOSP PERFORMED
Amphetamines: NOT DETECTED
BARBITURATES: NOT DETECTED
Benzodiazepines: NOT DETECTED
Cocaine: NOT DETECTED
Opiates: NOT DETECTED
Tetrahydrocannabinol: NOT DETECTED

## 2017-04-05 LAB — STREP PNEUMONIAE URINARY ANTIGEN: Strep Pneumo Urinary Antigen: NEGATIVE

## 2017-04-05 LAB — INFLUENZA PANEL BY PCR (TYPE A & B)
INFLAPCR: NEGATIVE
Influenza B By PCR: NEGATIVE

## 2017-04-05 MED ORDER — CEFTRIAXONE SODIUM 1 G IJ SOLR
1.0000 g | INTRAMUSCULAR | Status: DC
  Filled 2017-04-05: qty 10

## 2017-04-05 MED ORDER — SODIUM CHLORIDE 0.9 % IV SOLN: 1.0000 g | Freq: Once | INTRAVENOUS | Status: DC

## 2017-04-05 MED ORDER — CEFDINIR 125 MG/5ML PO SUSR
300.0000 mg | Freq: Two times a day (BID) | ORAL | Status: DC
  Administered 2017-04-05 – 2017-04-06 (×3): 300 mg via ORAL
  Filled 2017-04-05 (×4): qty 15

## 2017-04-05 MED ORDER — TAPENTADOL HCL 50 MG PO TABS
75.0000 mg | ORAL_TABLET | Freq: Three times a day (TID) | ORAL | Status: DC
  Administered 2017-04-05 – 2017-04-06 (×5): 75 mg via ORAL
  Filled 2017-04-05 (×5): qty 2

## 2017-04-05 MED ORDER — SODIUM CHLORIDE 0.9 % IV SOLN
500.0000 mg | INTRAVENOUS | Status: DC
  Filled 2017-04-05: qty 500

## 2017-04-05 MED ORDER — SODIUM CHLORIDE 0.9 % IV SOLN: 500.0000 mg | Freq: Once | INTRAVENOUS | Status: DC

## 2017-04-05 MED ORDER — GABAPENTIN 300 MG PO CAPS
300.0000 mg | ORAL_CAPSULE | Freq: Two times a day (BID) | ORAL | Status: DC
  Administered 2017-04-05 – 2017-04-06 (×2): 300 mg via ORAL
  Filled 2017-04-05 (×2): qty 1

## 2017-04-05 MED ORDER — ACETAMINOPHEN 325 MG PO TABS
650.0000 mg | ORAL_TABLET | Freq: Four times a day (QID) | ORAL | Status: DC | PRN
  Administered 2017-04-05: 650 mg via ORAL
  Filled 2017-04-05: qty 2

## 2017-04-05 MED ORDER — TAPENTADOL HCL 50 MG PO TABS
50.0000 mg | ORAL_TABLET | Freq: Once | ORAL | Status: AC
  Administered 2017-04-05: 50 mg via ORAL

## 2017-04-05 MED ORDER — CEFDINIR 125 MG/5ML PO SUSR: 300.0000 mg | Freq: Two times a day (BID) | ORAL | Status: DC

## 2017-04-05 NOTE — Progress Notes (Addendum)
Family Medicine Teaching Service Daily Progress Note Intern Pager: [phone]  Patient name: [name] Medical record number: [id number] Date of birth: 1955/12/31 Age: 62 y.o. Gender: female  Primary Care Provider: Forrest Moron, MD Consultants: none Code Status: Full  Pt Overview and Major Events to Date:  Admitted 3/3 overnight and given CTX and Azithro  Assessment and Plan: [name] is a 62 y.o. female presenting with fatigue, fever and body aches x1 day. PMH is significant for Type 2 diabetes with R and L diabetic foot ulcers, peripheral neuropathy, HTN, CKD stage 3, NASH, OSA w/ CPAP, H/o  CVA and TIAs, anxiety.  CAP: Patient with new fever and SOB requiring new supplemental O2 requirement in ED- now on RA. Influenza negative. Patient given rocephin and azithro in ED for CAP. Not HCAP because last hospitalization was in December. Does have recent antibiotic use of right lower leg cellulitis (s/p keflex and clindamycin -- could not tolerate latter due to rash). Procalcitonin 0.3 recommending continuing antibiotics in setting of lower respiratory tract infection per algorithm. - s/p rocephin and azithro x1 - will obtain 2 view CXR and determine whether to continue abx - Monitor respiratory status - Supplemental O2 to mainitian O2 >92% - Vitals q4 with pulse ox - prn tylenol for fever  T2DM: Last A1c 12.9 on 03/26/2017- followed by [facility]. Has 2 diabetic foot ulcers on L and R foot treated by Dr. Sharol Given.  Takes byetta 14mg BID, novolog 10U TID with SSI, lantus 200U every morning. -rSSI -lantus 100U qAM -continue to monitor and adjust as appropriate  HTN:  -continue coreg 236mBID daily  CKD, stage III: Stable. Cr 1.52 (baseline ~1.5-1.6) -avoid nephrotoxic agents -continue to monitor  OSA:  - CPAP qHS  Chronic pain: Takes tapentadol 7511mID and zanaflex 4mg77mD.[facility] confirmed medication  dosing with office. - continue current regimen - Miralax as needed for constipation - PT/OT consulted  Angina: Stable. 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 - Continue home ranexa 500 mg BID  H/o  CVA and TIAs: - continue home plavix 75 mg  Anxiety: Stable -continue buspar 15 mg TID and zoloft 50mg80mly  Peripheral neuropathy: - Continue home gabapentin 300mg 87m  decreasing to BID dosing  Hyperlipidemia: - continue home Lipitor 80mg d65m and Zetia 10mg da78m RLE erythema and swelling: Cellulitis vs venous stasis. Vascular US negatKoreae for DVT on 03/22/17. S/p keflex.  - Encourage frequent elevation of legs  FEN/GI:Heart Healthy/ Carb modified Prophylaxis:heparin  Disposition: Inpatient level of care- may be able to go home on oral abx  Subjective:  Patient stating she feels better this morning. Has new pain in R lower lung with deep inspiration.  Objective: Temp:  [98.4 F (36.9 C)-103.8 F (39.9 C)] 98.4 F (36.9 C) (03/04 0340) Pulse Rate:  [65-91] 65 (03/04 0300) Resp:  [13-20] 18 (03/04 0340) BP: (94-162)/(47-98) 94/55 (03/04 0340) SpO2:  [92 %-99 %] 92 % (03/04 0340) Weight:  [235 lb 0.2 oz (106.6 kg)-237 lb (107.5 kg)] 235 lb 0.2 oz (106.6 kg) (03/04 0340) Physical Exam: General: NAD, pleasant Eyes: PERRL, EOMI, no conjunctival pallor or injection ENTM: Moist mucous membranes, no pharyngeal erythema or exudate Neck: Supple, no LAD Cardiovascular: RRR, no m/r/g, no LE edema Respiratory: CTA BL, normal work of breathing Gastrointestinal: soft, nontender, nondistended, normoactive BS MSK: moves 4 extremities equally Derm: no rashes appreciated Neuro: CN II-XII grossly intact Psych: AOx3, appropriate affect  Laboratory:  Recent Labs  Lab 04/04/17 1552 04/05/17 0040  WBC 8.1 6.5  HGB 12.0 12.2  HCT 36.3 37.1  PLT 188 159   Recent Labs  Lab 04/04/17 1552 04/05/17 0040  NA 132* 137  K 5.0 3.9  CL 101 105  CO2 21* 20*  BUN 27* 25*   CREATININE 1.57* 1.52*  CALCIUM 8.6* 8.3*  PROT 6.6  --   BILITOT 1.6*  --   ALKPHOS 115  --   ALT 22  --   AST 45*  --   GLUCOSE 212* 155*   Procalcitonin 0.30  Imaging/Diagnostic Tests: Dg Chest Port 1 View  Result Date: 04/04/2017 CLINICAL DATA:  Weakness EXAM: PORTABLE CHEST 1 VIEW COMPARISON:  09/30/2016 FINDINGS: Mild increased interstitial and patchy airspace disease at the lung bases. No pleural effusion. Normal heart size. No pneumothorax. IMPRESSION: Mild increased interstitial and patchy airspace disease at the bases, could reflect minimal atelectasis versus infiltrates. Electronically Signed   By: Donavan Foil M.D.   On: 04/04/2017 18:06    Noni Stonesifer, [place], DO 04/05/2017, 7:22 AM PGY-1, [place] Intern pager: [phone], text pages welcome

## 2017-04-05 NOTE — Progress Notes (Signed)
Inpatient Diabetes Program Recommendations  AACE/ADA: New Consensus Statement on Inpatient Glycemic Control (2015)  Target Ranges:  Prepandial:   less than 140 mg/dL      Peak postprandial:   less than 180 mg/dL (1-2 hours)      Critically ill patients:  140 - 180 mg/dL   Spoke with patient about diabetes and home regimen for diabetes control. Patient reports that she is followed by Dr. Loanne Drilling, Endocrinology for diabetes management. Patient reports that she is taking insulin as prescribed. Patient reports insuylin changes at last Endocrinology visit on 1/28. Patient reports having a follow up appointment at the end of this month. Patient reports not seeing any change in her glucose levels. Daughter in room reports patient just recently started to change what she is eating to better control her glucose. Patients meter has glucose 130-high 400 range. Mostly higher. Spoke with patient and daughter about calling Dr. Loanne Drilling with high glucose readings as he requested on their visit summary from his office.  Discussed A1C results (12.9%). Discussed glucose and A1C goals. Discussed importance of checking CBGs and maintaining good CBG control to prevent long-term and short-term complications.  Patient and daughter verbalized understanding of information discussed and has no further questions at this time related to diabetes.   Thanks,  Tama Headings RN, MSN, [facility] Inpatient Diabetes Coordinator Team Pager [phone] (8a-5p)

## 2017-04-05 NOTE — Progress Notes (Signed)
Pt is on CPAP at this time tolerating it well. Settings adjusted per pt comfort

## 2017-04-05 NOTE — Discharge Summary (Signed)
[facility] Discharge Summary  Patient name: [name] Medical record number: [id number] Date of birth: [birth year] Age: 62 y.o. Gender: female Date of Admission: 04/04/2017  Date of Discharge: 04/06/17  Admitting Physician: Blane Ohara McDiarmid, MD  Primary Care Provider: Forrest Moron, MD Consultants: none  Indication for Hospitalization: cough, fever  Discharge Diagnoses/Problem List:  CAP T2DM HTN OSA CKD, stage III Chronic pain Angina H/o CVA and TIA's Anxiety Peripheral neuropathy Hyperlipidemia RLE erythema and swelling  Disposition: stable  Discharge Condition: home  Discharge Exam:  General: NAD, pleasant Eyes: PERRL, EOMI, no conjunctival pallor or injection ENTM: Moist mucous membranes Neck: Supple Cardiovascular: RRR, no m/r/g, 1+ LE edema Respiratory: CTA BL, normal work of breathing Gastrointestinal: soft, nontender, nondistended, normoactive BS MSK: moves 4 extremities equally Derm: no rashes appreciated Neuro: CN II-XII grossly intact Psych: AOx3, appropriate affect  Brief Hospital Course:  Patient is a 62 year old female who presented with fatigue, fever, and body aches for 1 day. Patient required new supplemental oxygen in ED initially 2 L.  Chest x-ray showed mild increased interstitial and patchy airspace disease at bases which could have reflected minimal atelectasis versus infiltrates.  Patient found to be influenza negative.  She was given Rocephin and azithromycin in the ED for coverage of CAP.  Patient with recent discharge from hospitalization in December, so was not treated as HCAP.  Patient also with recent antibiotic use of right lower leg cellulitis where she tried clindamycin as well as Keflex.  Patient finished her last dose of Keflex day of admission.  On admission pro-calcitonin was elevated to 0.3 which is recommending continuing antibiotics in setting of lower respiratory tract infection per algorithm.   Cefdinir was continued the next day and a repeat chest x-ray for 2 view was obtained.  Two-view chest x-ray  did not show any signs of consolidation suggestive of pneumonia.  However trending a pro-calcitonin increased to 1.16 and with patient remaining clinically improved and afebrile, she was sent home to continue antibiotics for presumed CAP. Patient also with recent cellulitis that antibiotics would also cover for.   Issues for Follow Up:  1. Ensure patient is continuing to improve from her recent CAP.  2. Patient will need f/u for liver mass (ct 8/18). 3. Patient has no CPAP at home and has many sleep studies reporting that she is in need. Please try to obtain CPAP for patient as outpatient.  4. A1c of 12.9, suggest increase in Byetta dosing to more therapeutic level of 22m BID.  5. Patient may benefit from different insulin product such as [place] better absorption.  Significant Procedures: none  Significant Labs and Imaging:  Recent Labs  Lab 04/04/17 1552 04/05/17 0040 04/06/17 0716  WBC 8.1 6.5 4.0  HGB 12.0 12.2 11.5*  HCT 36.3 37.1 35.6*  PLT 188 159 147*   Recent Labs  Lab 04/04/17 1552 04/05/17 0040 04/06/17 0639  NA 132* 137 138  K 5.0 3.9 3.7  CL 101 105 107  CO2 21* 20* 22  GLUCOSE 212* 155* 197*  BUN 27* 25* 25*  CREATININE 1.57* 1.52* 1.56*  CALCIUM 8.6* 8.3* 8.3*  ALKPHOS 115  --   --   AST 45*  --   --   ALT 22  --   --   ALBUMIN 2.9*  --   --     Results/Tests Pending at Time of Discharge: none  Discharge Medications:  Allergies as of 04/06/2017  Reactions   Lasix [furosemide]    Dehydration leading to kidney failure    Norvasc [amlodipine Besylate]    Severe edema   Prednisone Other (See Comments)   Ketoacidosis   Vancomycin Other (See Comments)   Total organ shut down   Doxycycline Hives   Clindamycin/lincomycin Hives, Itching   Ibuprofen Other (See Comments)   WAS TOLD TO NOT TAKE THIS BECAUSE OF HER KIDNEYS AND LIVER   Inderal  [propranolol] Cough   Lisinopril Other (See Comments)   WAS TOLD TO NOT TAKE THIS BECAUSE OF HER KIDNEYS AND LIVER   Lovastatin Other (See Comments)   WAS TOLD TO NOT TAKE THIS BECAUSE OF HER KIDNEYS AND LIVER   Norvasc [amlodipine Besylate] Other (See Comments)   Severe edema   Nsaids Other (See Comments)   WAS TOLD TO NOT TAKE THIS BECAUSE OF HER KIDNEYS AND LIVER   Sulfa Antibiotics Nausea Only      Medication List    STOP taking these medications   cephALEXin 500 MG capsule Commonly known as:  KEFLEX   clindamycin 300 MG capsule Commonly known as:  CLEOCIN     TAKE these medications   atorvastatin 80 MG tablet Commonly known as:  LIPITOR Take 1 tablet (80 mg total) by mouth daily at 6 PM.   BD ULTRA-FINE PEN NEEDLES 29G X 12.7MM Misc Generic drug:  Insulin Pen Needle   busPIRone 15 MG tablet Commonly known as:  BUSPAR Take 1 tablet (15 mg total) by mouth 3 (three) times daily.   carvedilol 25 MG tablet Commonly known as:  COREG Take 1 tablet (25 mg total) by mouth 2 (two) times daily.   cefdinir 125 MG/5ML suspension Commonly known as:  OMNICEF Take 12 mLs (300 mg total) by mouth 2 (two) times daily for 5 days.   clopidogrel 75 MG tablet Commonly known as:  PLAVIX Take 1 tablet (75 mg total) by mouth daily.   exenatide 5 MCG/0.02ML Sopn injection Commonly known as:  BYETTA 5 MCG PEN Inject 0.02 mLs (5 mcg total) into the skin 2 (two) times daily with a meal.   ezetimibe 10 MG tablet Commonly known as:  ZETIA Take 1 tablet (10 mg total) by mouth daily.   gabapentin 300 MG capsule Commonly known as:  NEURONTIN Take 1 capsule (300 mg total) by mouth 3 (three) times daily.   insulin aspart 100 UNIT/ML FlexPen Commonly known as:  NOVOLOG Inject 10 Units into the skin 3 (three) times daily before meals. "PER A SLIDING SCALE OF AN ADDITIONAL 2-10 UNITS"   LANTUS SOLOSTAR 100 UNIT/ML Solostar Pen Generic drug:  Insulin Glargine Inject 200 Units into the  skin every morning.   NUCYNTA 75 MG tablet Generic drug:  tapentadol HCl Take 75 mg by mouth 3 (three) times daily.   ranolazine 500 MG 12 hr tablet Commonly known as:  RANEXA Take 1 tablet (500 mg total) by mouth 2 (two) times daily.   sertraline 50 MG tablet Commonly known as:  ZOLOFT Take 1 tablet (50 mg total) by mouth daily.   tiZANidine 4 MG tablet Commonly known as:  ZANAFLEX Take 4 mg by mouth 3 (three) times daily.            Durable Medical Equipment  (From admission, onward)        Start     Ordered   04/06/17 1605  For home use only DME Gilford Rile  Once    Comments:  Front wheel walker  Question:  Patient needs a walker to treat with the following condition  Answer:  Weakness   04/06/17 1605   04/05/17 2243  For home use only DME 3 n 1  Once     04/05/17 2242      Discharge Instructions: Please refer to Patient Instructions section of EMR for full details.  Patient was counseled important signs and symptoms that should prompt return to medical care, changes in medications, dietary instructions, activity restrictions, and follow up appointments.   Follow-Up Appointments: Follow-up Information    [place] [place] Follow up.   Specialty:  [place] Why:  A representative from [facility] will contact you to arrange start date and time for your therapy. Contact information: [address] [place] [address] [phone]           Nain Rudd, [place], DO 04/06/2017, 9:54 PM PGY-1, [place]

## 2017-04-05 NOTE — Evaluation (Signed)
Physical Therapy Evaluation Patient Details Name: [name] MRN: [id number] DOB: [birth year] Today's Date: 04/05/2017   History of Present Illness  62yo female presenting with fatigue, fever, body aches, and supplemental O2 needs in the ED. Diagnosed with CAP. Negative for DVT. PMH CKD, chronic pain, CAD, DM, HTN, osteomyelitis, PVD, CVA, foot ulcers, back surgery, hernia repair, toe amputation, joint replacement   Clinical Impression   Patient received in bed, very pleasant and willing to participate in PT session this afternoon. She does report a history of recent falls, and family states that she is very unsteady and unsafe on the stairs at home even with railings. She is able to complete functional bed mobility with S, and requires Min guard for functional transfers and gait inside of the room today. SpO2 remained at 95-96% on room air throughout session. She was left up in the chair with all needs met, alarm activated, and family present this afternoon. She will continue to benefit from skilled PT services in the acute setting, and from skilled HHPT services to further address functional deficits moving forward.      Follow Up Recommendations Home health PT    Equipment Recommendations  3in1 (PT)    Recommendations for Other Services       Precautions / Restrictions Precautions Precautions: Fall Restrictions Weight Bearing Restrictions: No      Mobility  Bed Mobility Overal bed mobility: Needs Assistance Bed Mobility: Supine to Sit     Supine to sit: Supervision     General bed mobility comments: increased time and effort   Transfers Overall transfer level: Needs assistance Equipment used: Rolling walker (2 wheeled) Transfers: Sit to/from Stand Sit to Stand: Min guard         General transfer comment: Min guard, verbal cues for safety   Ambulation/Gait Ambulation/Gait assistance: Min guard Ambulation Distance (Feet): 10 Feet Assistive device: Rolling  walker (2 wheeled) Gait Pattern/deviations: Step-through pattern;Decreased step length - right;Decreased step length - left;Trunk flexed     General Gait Details: Min guard for safety, distance limited by fatigue and mild dyspnea; patient declines further gait in room/out into hallway today   Stairs            Wheelchair Mobility    Modified Rankin (Stroke Patients Only)       Balance Overall balance assessment: Mild deficits observed, not formally tested;History of Falls                                           Pertinent Vitals/Pain Pain Assessment: No/denies pain    Home Living Family/patient expects to be discharged to:: Private residence Living Arrangements: Children Available Help at Discharge: Available 24 hours/day Type of Home: Mobile home Home Access: Stairs to enter Entrance Stairs-Rails: Can reach both Entrance Stairs-Number of Steps: 5 in the front, 9 in the back  Home Layout: One level Home Equipment: Schneider - 4 wheels;Cane - single point;Shower seat      Prior Function Level of Independence: Independent with assistive device(s)         Comments: uses cane, has 4WW; uses cane and walker regularly      Hand Dominance        Extremity/Trunk Assessment        Lower Extremity Assessment Lower Extremity Assessment: Generalized weakness    Cervical / Trunk Assessment Cervical / Trunk Assessment: Kyphotic  Communication  Cognition Arousal/Alertness: Awake/alert Behavior During Therapy: WFL for tasks assessed/performed Overall Cognitive Status: Within Functional Limits for tasks assessed                                        General Comments      Exercises     Assessment/Plan    PT Assessment Patient needs continued PT services  PT Problem List Decreased strength;Decreased mobility;Decreased safety awareness;Decreased coordination;Obesity;Decreased activity tolerance;Decreased balance        PT Treatment Interventions DME instruction;Therapeutic activities;Gait training;Therapeutic exercise;Patient/family education;Stair training;Balance training;Functional mobility training;Neuromuscular re-education    PT Goals (Current goals can be found in the Care Plan section)  Acute Rehab PT Goals Patient Stated Goal: to get well, go home  PT Goal Formulation: With patient/family Time For Goal Achievement: 04/19/17 Potential to Achieve Goals: Good    Frequency Min 3X/week   Barriers to discharge        Co-evaluation               AM-PAC PT "6 Clicks" Daily Activity  Outcome Measure Difficulty turning over in bed (including adjusting bedclothes, sheets and blankets)?: None Difficulty moving from lying on back to sitting on the side of the bed? : None Difficulty sitting down on and standing up from a chair with arms (e.g., wheelchair, bedside commode, etc,.)?: None Help needed moving to and from a bed to chair (including a wheelchair)?: A Little Help needed walking in hospital room?: A Little Help needed climbing 3-5 steps with a railing? : A Lot 6 Click Score: 20    End of Session Equipment Utilized During Treatment: Gait belt Activity Tolerance: Patient tolerated treatment well Patient left: in chair;with call bell/phone within reach;with family/visitor present;with chair alarm set   PT Visit Diagnosis: Unsteadiness on feet (R26.81);Muscle weakness (generalized) (M62.81);History of falling (Z91.81)    Time: 1610-9604 PT Time Calculation (min) (ACUTE ONLY): 18 min   Charges:   PT Evaluation $PT Eval Low Complexity: 1 Low     PT G Codes:        Deniece Ree PT, DPT, CBIS  Supplemental Physical Therapist [place]   Pager [phone]

## 2017-04-06 LAB — GLUCOSE, CAPILLARY
GLUCOSE-CAPILLARY: 282 mg/dL — AB (ref 65–99)
GLUCOSE-CAPILLARY: 193 mg/dL — AB (ref 65–99)
Glucose-Capillary: 209 mg/dL — ABNORMAL HIGH (ref 65–99)

## 2017-04-06 LAB — PROCALCITONIN: PROCALCITONIN: 1.16 ng/mL

## 2017-04-06 LAB — BASIC METABOLIC PANEL
Anion gap: 9 (ref 5–15)
BUN: 25 mg/dL — AB (ref 6–20)
CO2: 22 mmol/L (ref 22–32)
Calcium: 8.3 mg/dL — ABNORMAL LOW (ref 8.9–10.3)
Chloride: 107 mmol/L (ref 101–111)
Creatinine, Ser: 1.56 mg/dL — ABNORMAL HIGH (ref 0.44–1.00)
GFR calc Af Amer: 40 mL/min — ABNORMAL LOW (ref >60)
GFR, EST NON AFRICAN AMERICAN: 35 mL/min — AB (ref >60)
GLUCOSE: 197 mg/dL — AB (ref 65–99)
POTASSIUM: 3.7 mmol/L (ref 3.5–5.1)
Sodium: 138 mmol/L (ref 135–145)

## 2017-04-06 LAB — LEGIONELLA PNEUMOPHILA SEROGP 1 UR AG: L. PNEUMOPHILA SEROGP 1 UR AG: NEGATIVE

## 2017-04-06 LAB — CBC
HEMATOCRIT: 35.6 % — AB (ref 36.0–46.0)
Hemoglobin: 11.5 g/dL — ABNORMAL LOW (ref 12.0–15.0)
MCH: 27.3 pg (ref 26.0–34.0)
MCHC: 32.3 g/dL (ref 30.0–36.0)
MCV: 84.6 fL (ref 78.0–100.0)
Platelets: 147 10*3/uL — ABNORMAL LOW (ref 150–400)
RBC: 4.21 MIL/uL (ref 3.87–5.11)
RDW: 14.4 % (ref 11.5–15.5)
WBC: 4 10*3/uL (ref 4.0–10.5)

## 2017-04-06 LAB — URINE CULTURE: Culture: NO GROWTH

## 2017-04-06 MED ORDER — CEFDINIR 125 MG/5ML PO SUSR: 300.0000 mg | Freq: Two times a day (BID) | ORAL | 0 refills | Status: AC

## 2017-04-06 NOTE — Care Management Note (Signed)
Case Management Note  Patient Details  Name: [name] MRN: [id number] Date of Birth: Nov 15, 1955  Subjective/Objective:     62 yr old female admitted with CAP.                Action/Plan: Case manager spoke with patient and her daughter concerning discharge plan and DME. Choice for [place] was offered, referral was called to [place], [facility] Liaison. Patient will have family assistance at discharge.    Expected Discharge Date:    pending              Expected Discharge Plan:  [place]  In-House Referral:  NA  Discharge planning Services  CM Consult  Post Acute Care Choice:  Durable Medical Equipment, Home Health Choice offered to:  Patient  DME Arranged:  3-N-1, Walker rolling DME Agency:  [place] Arranged:  PT, OT [facility] Agency:  [place]  Status of Service:  Completed, signed off  If discussed at [place] of Stay Meetings, dates discussed:    Additional Comments:  Ninfa Meeker, RN 04/06/2017, 3:30 PM

## 2017-04-06 NOTE — Progress Notes (Signed)
Inpatient Diabetes Program Recommendations  AACE/ADA: New Consensus Statement on Inpatient Glycemic Control (2015)  Target Ranges:  Prepandial:   less than 140 mg/dL      Peak postprandial:   less than 180 mg/dL (1-2 hours)      Critically ill patients:  140 - 180 mg/dL   Results for [place], Yecenia (MRN [id number]) as of 04/06/2017 08:13  Ref. Range 04/05/2017 08:04 04/05/2017 12:48 04/05/2017 16:59 04/05/2017 22:11 04/06/2017 07:43  Glucose-Capillary Latest Ref Range: 65 - 99 mg/dL 205 (H) 298 (H) 241 (H) 182 (H) 209 (H)   Review of Glycemic Control  Diabetes history: DM 2 Outpatient Diabetes medications: Lantus 200 units Daily, Novolog 10 units tid with meals Current orders for Inpatient glycemic control: Lantus 100 units Daily, Novolog Resistant Correction 0-20 units tid  Inpatient Diabetes Program Recommendations:    Glucose in 200's and trends increase after meals. Patient receiving at least 7-11 units of Novolog with each meal. Patient takes meal coverage at home. Consider Novolog 8 units tid meal coverage if patient consumes at least 50% of meals.  Thanks,  Tama Headings RN, MSN, BC-ADM, [facility] Inpatient Diabetes Coordinator Team Pager [phone] (8a-5p)

## 2017-04-06 NOTE — Discharge Instructions (Signed)
It was a pleasure caring for you while you were admitted. You were admitted for presumed community acquired pneumonia. You will need to complete a course of antibiotics. Please take cefdinir daily until March 10.   Community-Acquired Pneumonia, Adult Pneumonia is an infection of the lungs. There are different types of pneumonia. One type can develop while a person is in a hospital. A different type, called community-acquired pneumonia, develops in people who are not, or have not recently been, in the hospital or other health care facility. What are the causes? Pneumonia may be caused by bacteria, viruses, or funguses. Community-acquired pneumonia is often caused by Streptococcus pneumonia bacteria. These bacteria are often passed from one person to another by breathing in droplets from the cough or sneeze of an infected person. What increases the risk? The condition is more likely to develop in:  People who havechronic diseases, such as chronic obstructive pulmonary disease (COPD), asthma, congestive heart failure, cystic fibrosis, diabetes, or kidney disease.  People who haveearly-stage or late-stage HIV.  People who havesickle cell disease.  People who havehad their spleen removed (splenectomy).  People who havepoor Human resources officer.  People who havemedical conditions that increase the risk of breathing in (aspirating) secretions their own mouth and nose.  People who havea weakened immune system (immunocompromised).  People who smoke.  People whotravel to areas where pneumonia-causing germs commonly exist.  People whoare around animal habitats or animals that have pneumonia-causing germs, including birds, bats, rabbits, cats, and farm animals.  What are the signs or symptoms? Symptoms of this condition include:  Adry cough.  A wet (productive) cough.  Fever.  Sweating.  Chest pain, especially when breathing deeply or coughing.  Rapid breathing or difficulty  breathing.  Shortness of breath.  Shaking chills.  Fatigue.  Muscle aches.  How is this diagnosed? Your health care provider will take a medical history and perform a physical exam. You may also have other tests, including:  Imaging studies of your chest, including X-rays.  Tests to check your blood oxygen level and other blood gases.  Other tests on blood, mucus (sputum), fluid around your lungs (pleural fluid), and urine.  If your pneumonia is severe, other tests may be done to identify the specific cause of your illness. How is this treated? The type of treatment that you receive depends on many factors, such as the cause of your pneumonia, the medicines you take, and other medical conditions that you have. For most adults, treatment and recovery from pneumonia may occur at home. In some cases, treatment must happen in a hospital. Treatment may include:  Antibiotic medicines, if the pneumonia was caused by bacteria.  Antiviral medicines, if the pneumonia was caused by a virus.  Medicines that are given by mouth or through an IV tube.  Oxygen.  Respiratory therapy.  Although rare, treating severe pneumonia may include:  Mechanical ventilation. This is done if you are not breathing well on your own and you cannot maintain a safe blood oxygen level.  Thoracentesis. This procedureremoves fluid around one lung or both lungs to help you breathe better.  Follow these instructions at home:  Take over-the-counter and prescription medicines only as told by your health care provider. ? Only takecough medicine if you are losing sleep. Understand that cough medicine can prevent your bodys natural ability to remove mucus from your lungs. ? If you were prescribed an antibiotic medicine, take it as told by your health care provider. Do not stop taking the antibiotic even  if you start to feel better.  Sleep in a semi-upright position at night. Try sleeping in a reclining chair, or  place a few pillows under your head.  Do not use tobacco products, including cigarettes, chewing tobacco, and e-cigarettes. If you need help quitting, ask your health care provider.  Drink enough water to keep your urine clear or pale yellow. This will help to thin out mucus secretions in your lungs. How is this prevented? There are ways that you can decrease your risk of developing community-acquired pneumonia. Consider getting a pneumococcal vaccine if:  You are older than 62 years of age.  You are older than 62 years of age and are undergoing cancer treatment, have chronic lung disease, or have other medical conditions that affect your immune system. Ask your health care provider if this applies to you.  There are different types and schedules of pneumococcal vaccines. Ask your health care provider which vaccination option is best for you. You may also prevent community-acquired pneumonia if you take these actions:  Get an influenza vaccine every year. Ask your health care provider which type of influenza vaccine is best for you.  Go to the dentist on a regular basis.  Wash your hands often. Use hand sanitizer if soap and water are not available.  Contact a health care provider if:  You have a fever.  You are losing sleep because you cannot control your cough with cough medicine. Get help right away if:  You have worsening shortness of breath.  You have increased chest pain.  Your sickness becomes worse, especially if you are an older adult or have a weakened immune system.  You cough up blood. This information is not intended to replace advice given to you by your health care provider. Make sure you discuss any questions you have with your health care provider. Document Released: 01/19/2005 Document Revised: 05/30/2015 Document Reviewed: 05/16/2014 Elsevier Interactive Patient Education  [employer].

## 2017-04-06 NOTE — Evaluation (Signed)
Occupational Therapy Evaluation Patient Details Name: Chirstine Tagliaferri MRN: [id number] DOB: 1955-09-18 Today's Date: 04/06/2017    History of Present Illness 62yo female presenting with fatigue, fever, body aches, and supplemental O2 needs in the ED. Diagnosed with CAP. Negative for DVT. PMH CKD, chronic pain, CAD, DM, HTN, osteomyelitis, PVD, CVA, foot ulcers, back surgery, hernia repair, toe amputation, joint replacement    Clinical Impression   PTA, pt was independent with basic ADL and functional mobility in her home but uses RW or cane for mobility in the community. Pt reports limiting her activity at home and daughter shared with OT that pt will go a few days without showering at times. Daughter was very vocal during session and OT re-directed focus of session on pt's needs. She required min guard assist for functional mobility in and out of room as well as for LB ADL and supervision for standing grooming tasks. Pt would benefit from continued OT services while admitted to improve independence and safety with ADL and functional mobility. Additionally, recommend home health OT follow-up post-acute D/C.    Follow Up Recommendations  Home health OT;Supervision/Assistance - 24 hour    Equipment Recommendations  None recommended by OT    Recommendations for Other Services       Precautions / Restrictions Precautions Precautions: Fall Restrictions Weight Bearing Restrictions: No      Mobility Bed Mobility Overal bed mobility: Needs Assistance Bed Mobility: Supine to Sit     Supine to sit: Supervision     General bed mobility comments: increased time and effort   Transfers Overall transfer level: Needs assistance Equipment used: Rolling walker (2 wheeled) Transfers: Sit to/from Stand Sit to Stand: Min guard         General transfer comment: Min guard, verbal cues for safety     Balance Overall balance assessment: Mild deficits observed, not formally tested;History of  Falls                                         ADL either performed or assessed with clinical judgement   ADL Overall ADL's : Needs assistance/impaired Eating/Feeding: Sitting;Modified independent   Grooming: Wash/dry hands;Supervision/safety;Standing   Upper Body Bathing: Supervision/ safety;Sitting   Lower Body Bathing: Min guard;Sit to/from stand   Upper Body Dressing : Supervision/safety;Sitting   Lower Body Dressing: Min guard;Sit to/from stand   Toilet Transfer: Min guard;Ambulation(cane)   Toileting- Clothing Manipulation and Hygiene: Min guard;Sit to/from stand       Functional mobility during ADLs: Min guard;Cane General ADL Comments: Pt able to ambulate in hallway for simulated IADL with min guard assist and single seated therapeutic rest break.     Vision Patient Visual Report: No change from baseline Vision Assessment?: No apparent visual deficits     Perception     Praxis      Pertinent Vitals/Pain Pain Assessment: No/denies pain     Hand Dominance Right   Extremity/Trunk Assessment Upper Extremity Assessment Upper Extremity Assessment: Generalized weakness   Lower Extremity Assessment Lower Extremity Assessment: Generalized weakness       Communication Communication Communication: No difficulties   Cognition Arousal/Alertness: Awake/alert Behavior During Therapy: WFL for tasks assessed/performed Overall Cognitive Status: Within Functional Limits for tasks assessed  General Comments: however, decreased motivation to engage in ADL.    General Comments  Daughter present and engaged in session. Daughter frequently offering opinion concerning her mother's lifestyle without mother asking.     Exercises     Shoulder Instructions      Home Living Family/patient expects to be discharged to:: Private residence Living Arrangements: Children Available Help at Discharge:  Family;Available 24 hours/day Type of Home: Mobile home Home Access: Stairs to enter Entrance Stairs-Number of Steps: 5 in the front, 9 in the back  Entrance Stairs-Rails: Can reach both Home Layout: One level     Bathroom Shower/Tub: Tub/shower unit;Walk-in shower   Bathroom Toilet: Standard     Home Equipment: Environmental consultant - 4 wheels;Cane - single point;Shower seat          Prior Functioning/Environment Level of Independence: Independent with assistive device(s)        Comments: Per daughter, pt has been limiting her functional participation at times. Pt reports independence with basic ADL and functional mobility. Does not drive.         OT Problem List: Decreased strength;Decreased range of motion;Decreased activity tolerance;Impaired balance (sitting and/or standing);Decreased safety awareness;Decreased knowledge of use of DME or AE;Decreased knowledge of precautions;Cardiopulmonary status limiting activity      OT Treatment/Interventions: Self-care/ADL training;Therapeutic exercise;Energy conservation;DME and/or AE instruction;Therapeutic activities;Patient/family education;Balance training    OT Goals(Current goals can be found in the care plan section) Acute Rehab OT Goals Patient Stated Goal: to get well, go home  OT Goal Formulation: With patient/family Time For Goal Achievement: 04/20/17 Potential to Achieve Goals: Good ADL Goals Pt Will Perform Grooming: with modified independence;standing Pt Will Perform Lower Body Dressing: with modified independence;sit to/from stand Pt Will Transfer to Toilet: with modified independence;ambulating;regular height toilet Pt Will Perform Toileting - Clothing Manipulation and hygiene: with modified independence;sit to/from stand Pt Will Perform Tub/Shower Transfer: with modified independence;ambulating;shower seat;Tub transfer(with cane) Pt/caregiver will Perform Home Exercise Program: Both right and left upper extremity;Increased  strength;With written HEP provided;Independently  OT Frequency: Min 2X/week   Barriers to D/C:            Co-evaluation              AM-PAC PT "6 Clicks" Daily Activity     Outcome Measure Help from another person eating meals?: None Help from another person taking care of personal grooming?: A Little Help from another person toileting, which includes using toliet, bedpan, or urinal?: A Little Help from another person bathing (including washing, rinsing, drying)?: A Little Help from another person to put on and taking off regular upper body clothing?: A Little Help from another person to put on and taking off regular lower body clothing?: A Little 6 Click Score: 19   End of Session Equipment Utilized During Treatment: Gait belt(cane) Nurse Communication: Mobility status  Activity Tolerance: Patient tolerated treatment well Patient left: in bed;with family/visitor present;with call bell/phone within reach(sitting up (cross-legged) on bed)  OT Visit Diagnosis: Unsteadiness on feet (R26.81);Muscle weakness (generalized) (M62.81)                Time: 1015-1030 OT Time Calculation (min): 15 min Charges:  OT General Charges $OT Visit: 1 Visit OT Evaluation $OT Eval Low Complexity: 1 Low G-Codes:     Norman Herrlich, MS OTR/L  Pager: [place] [facility] Mehak Roskelley 04/06/2017, 12:37 PM

## 2017-04-06 NOTE — Progress Notes (Addendum)
Family Medicine Teaching Service Daily Progress Note Intern Pager: [phone]  Patient name: [name] Medical record number: [id number] Date of birth: [birth year] Age: 62 y.o. Gender: female  Primary Care Provider: Forrest Moron, MD Consultants: none Code Status: Full  Pt Overview and Major Events to Date:  Admitted 3/3 overnight and given CTX and Azithro  Assessment and Plan: [name] is a 62 y.o. female presenting with fatigue, fever and body aches x1 day. PMH is significant for Type 2 diabetes with R and L diabetic foot ulcers, peripheral neuropathy, HTN, CKD stage 3, NASH, OSA w/ CPAP, H/o  CVA and TIAs, anxiety.  CAP: Patient on RA. Influenza negative. Procalcitonin 0.3>1.16 recommending continuing antibiotics in setting of lower respiratory tract infection per algorithm. - s/p rocephin and azithro x1, cefdinir (3/4-): will continue until 3/10 due to clinical improvement and procalcitonin - 2 view CXR with stable changes in bases that do not appear to be focal pneumonia. - Monitor respiratory status - Supplemental O2 to mainitian O2 >92% - Vitals q4 with pulse ox - prn tylenol for fever  T2DM: Last A1c 12.9 on 03/26/2017- followed by [facility]. Has 2 diabetic foot ulcers on L and R foot treated by Dr. Sharol Given.  Takes byetta 6mg BID, novolog 10U TID with SSI, lantus 200U every morning. -rSSI -lantus 100U qAM -continue to monitor and adjust as appropriate CBG's 182-209  HTN:  -continue coreg 219mBID daily  CKD, stage III: Stable. Cr 1.52 (baseline ~1.5-1.6) -avoid nephrotoxic agents -continue to monitor  OSA:  - CPAP qHS  Chronic pain: Takes tapentadol 7530mID and zanaflex 4mg38mD.[facility] confirmed medication dosing with office. - continue current regimen - Miralax as needed for constipation - PT/OT- PT recommending HHPT - UDS neg for opiates  Angina: Stable. 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 - Continue home ranexa 500 mg BID  H/o  CVA and TIAs: - continue home plavix 75 mg  Anxiety: Stable -continue buspar 15 mg TID and zoloft 50mg22mly  Peripheral neuropathy: - Continue home gabapentin 300mg 21m  decreased to BID   Hyperlipidemia: - continue home Lipitor 80mg d71m and Zetia 10mg da65m RLE erythema and swelling: Cellulitis vs venous stasis. Vascular US negat[place]oreae for DVT on 03/22/17. S/p keflex.  - Encourage frequent elevation of legs  FEN/GI:Heart Healthy/ Carb modified Prophylaxis: heparin  Disposition: Medically stable for discharge  Subjective:  Patient feeling much better today, she feels able to go home. Has no CPAP at home and would like one. She has had many sleep studies saying she has OSA.   Objective: Temp:  [97.9 F (36.6 C)-98.2 F (36.8 C)] 97.9 F (36.6 C) (03/05 0645) Pulse Rate:  [62-67] 67 (03/05 0645) Resp:  [17-18] 17 (03/05 0645) BP: (116-156)/(58-70) 156/70 (03/05 0645) SpO2:  [95 %-96 %] 96 % (03/05 0645) Physical Exam: General: NAD, pleasant Eyes: PERRL, EOMI, no conjunctival pallor or injection ENTM: Moist mucous membranes Neck: Supple Cardiovascular: RRR, no m/r/g, 1+ pitting LE edema Respiratory: CTA BL, normal work of breathing Gastrointestinal: soft, nontender, nondistended, normoactive BS, diastasis recti MSK: moves 4 extremities equally, 1 +pitting edema with no redness noted on RLE Derm: no rashes appreciated Neuro: CN II-XII grossly intact Psych: AO, appropriate affect  Laboratory: Recent Labs  Lab 04/04/17 1552 04/05/17 0040  WBC 8.1 6.5  HGB 12.0 12.2  HCT 36.3 37.1  PLT 188 159   Recent Labs  Lab 04/04/17 1552 04/05/17 0040  NA 132* 137  K 5.0 3.9  CL 101 105  CO2 21* 20*  BUN 27* 25*  CREATININE 1.57* 1.52*  CALCIUM 8.6* 8.3*  PROT 6.6  --   BILITOT 1.6*  --   ALKPHOS 115  --   ALT 22  --   AST 45*  --   GLUCOSE 212* 155*   Procalcitonin 0.30  Imaging/Diagnostic  Tests: Dg Chest 2 View  Result Date: 04/05/2017 CLINICAL DATA:  Shortness of breath and fevers EXAM: CHEST  2 VIEW COMPARISON:  04/04/2017 FINDINGS: Cardiac shadow is stable. Spinal stimulator is again noted. Lungs are well aerated bilaterally. Patchy changes are again seen in the bases stable from the prior exam. No new sizable effusion is seen. No bony abnormality is noted. IMPRESSION: Stable changes in the bases bilaterally. No new focal abnormality is noted. Electronically Signed   By: Inez Catalina M.D.   On: 04/05/2017 12:23    [name], [place], DO 04/06/2017, 7:16 AM PGY-1,  Intern pager: [phone], text pages welcome

## 2017-04-07 ENCOUNTER — Telehealth: Payer: Self-pay | Admitting: Family Medicine

## 2017-04-07 ENCOUNTER — Ambulatory Visit (INDEPENDENT_AMBULATORY_CARE_PROVIDER_SITE_OTHER): Payer: Medicare HMO | Admitting: Family Medicine

## 2017-04-07 ENCOUNTER — Encounter: Payer: Self-pay | Admitting: Family Medicine

## 2017-04-07 ENCOUNTER — Other Ambulatory Visit: Payer: Self-pay

## 2017-04-07 VITALS — BP 150/70 | HR 94 | Temp 98.6°F | Ht 67.0 in | Wt 234.2 lb

## 2017-04-07 DIAGNOSIS — Z09 Encounter for follow-up examination after completed treatment for conditions other than malignant neoplasm: Secondary | ICD-10-CM

## 2017-04-07 DIAGNOSIS — J189 Pneumonia, unspecified organism: Secondary | ICD-10-CM

## 2017-04-07 DIAGNOSIS — J181 Lobar pneumonia, unspecified organism: Secondary | ICD-10-CM

## 2017-04-07 DIAGNOSIS — E639 Nutritional deficiency, unspecified: Secondary | ICD-10-CM

## 2017-04-07 DIAGNOSIS — G4733 Obstructive sleep apnea (adult) (pediatric): Secondary | ICD-10-CM

## 2017-04-07 DIAGNOSIS — R160 Hepatomegaly, not elsewhere classified: Secondary | ICD-10-CM

## 2017-04-07 DIAGNOSIS — E1165 Type 2 diabetes mellitus with hyperglycemia: Secondary | ICD-10-CM

## 2017-04-07 NOTE — Patient Instructions (Addendum)
1. Referral placed for Neurology for sleep apnea 2. Referral placed for GI for her liver mass that was on 8/18  3. Continue your diabetes medicines and eat protein with your carbohydrates     IF you received an x-ray today, you will receive an invoice from [facility]. Please contact [facility] at [phone] with questions or concerns regarding your invoice.   IF you received labwork today, you will receive an invoice from [facility]. Please contact [facility] at [phone] with questions or concerns regarding your invoice.   Our billing staff will not be able to assist you with questions regarding bills from these companies.  You will be contacted with the lab results as soon as they are available. The fastest way to get your results is to activate your My Chart account. Instructions are located on the last page of this paperwork. If you have not heard from [place] regarding the results in 2 weeks, please contact this office.     Hypoglycemia Hypoglycemia occurs when the level of sugar (glucose) in the blood is too low. Glucose is a type of sugar that provides the body's main source of energy. Certain hormones (insulin and glucagon) control the level of glucose in the blood. Insulin lowers blood glucose, and glucagon increases blood glucose. Hypoglycemia can result from having too much insulin in the bloodstream, or from not eating enough food that contains glucose. Hypoglycemia can happen in people who do or do not have diabetes. It can develop quickly, and it can be a medical emergency. What are the causes? Hypoglycemia occurs most often in people who have diabetes. If you have diabetes, hypoglycemia may be caused by:  Diabetes medicine.  Not eating enough, or not eating often enough.  Increased physical activity.  Drinking alcohol, especially when you have not eaten recently.  If you do not have diabetes, hypoglycemia may be caused by:  A tumor in the pancreas. The  pancreas is the organ that makes insulin.  Not eating enough, or not eating for long periods at a time (fasting).  Severe infection or illness that affects the liver, heart, or kidneys.  Certain medicines.  You may also have reactive hypoglycemia. This condition causes hypoglycemia within 4 hours of eating a meal. This may occur after having stomach surgery. Sometimes, the cause of reactive hypoglycemia is not known. What increases the risk? Hypoglycemia is more likely to develop in:  People who have diabetes and take medicines to lower blood glucose.  People who abuse alcohol.  People who have a severe illness.  What are the signs or symptoms? Hypoglycemia may not cause any symptoms. If you have symptoms, they may include:  Hunger.  Anxiety.  Sweating and feeling clammy.  Confusion.  Dizziness or feeling light-headed.  Sleepiness.  Nausea.  Increased heart rate.  Headache.  Blurry vision.  Seizure.  Nightmares.  Tingling or numbness around the mouth, lips, or tongue.  A change in speech.  Decreased ability to concentrate.  A change in coordination.  Restless sleep.  Tremors or shakes.  Fainting.  Irritability.  How is this diagnosed? Hypoglycemia is diagnosed with a blood test to measure your blood glucose level. This blood test is done while you are having symptoms. Your health care provider may also do a physical exam and review your medical history. If you do not have diabetes, other tests may be done to find the cause of your hypoglycemia. How is this treated? This condition can often be treated by immediately eating or  drinking something that contains glucose, such as:  3-4 sugar tablets (glucose pills).  Glucose gel, 15-gram tube.  Fruit juice, 4 oz (120 mL).  Regular soda (not diet soda), 4 oz (120 mL).  Low-fat milk, 4 oz (120 mL).  Several pieces of hard candy.  Sugar or honey, 1 Tbsp.  Treating Hypoglycemia If You Have  Diabetes  If you are alert and able to swallow safely, follow the 15:15 rule:  Take 15 grams of a rapid-acting carbohydrate. Rapid-acting options include: ? 1 tube of glucose gel. ? 3 glucose pills. ? 6-8 pieces of hard candy. ? 4 oz (120 mL) of fruit juice. ? 4 oz (120 ml) of regular (not diet) soda.  Check your blood glucose 15 minutes after you take the carbohydrate.  If the repeat blood glucose level is still at or below 70 mg/dL (3.9 mmol/L), take 15 grams of a carbohydrate again.  If your blood glucose level does not increase above 70 mg/dL (3.9 mmol/L) after 3 tries, seek emergency medical care.  After your blood glucose level returns to normal, eat a meal or a snack within 1 hour.  Treating Severe Hypoglycemia Severe hypoglycemia is when your blood glucose level is at or below 54 mg/dL (3 mmol/L). Severe hypoglycemia is an emergency. Do not wait to see if the symptoms will go away. Get medical help right away. Call your local emergency services (911 in the [place] Do not drive yourself to the hospital. If you have severe hypoglycemia and you cannot eat or drink, you may need an injection of glucagon. A family member or close friend should learn how to check your blood glucose and how to give you a glucagon injection. Ask your health care provider if you need to have an emergency glucagon injection kit available. Severe hypoglycemia may need to be treated in a hospital. The treatment may include getting glucose through an IV tube. You may also need treatment for the cause of your hypoglycemia. Follow these instructions at home: General instructions  Avoid any diets that cause you to not eat enough food. Talk with your health care provider before you start any new diet.  Take over-the-counter and prescription medicines only as told by your health care provider.  Limit alcohol intake to no more than 1 drink per day for nonpregnant women and 2 drinks per day for men. One drink equals  12 oz of beer, 5 oz of wine, or 1 oz of hard liquor.  Keep all follow-up visits as told by your health care provider. This is important. If You Have Diabetes:   Make sure you know the symptoms of hypoglycemia.  Always have a rapid-acting carbohydrate snack with you to treat low blood sugar.  Follow your diabetes management plan, as told by your health care provider. Make sure you: ? Take your medicines as directed. ? Follow your exercise plan. ? Follow your meal plan. Eat on time, and do not skip meals. ? Check your blood glucose as often as directed. Make sure to check your blood glucose before and after exercise. If you exercise longer or in a different way than usual, check your blood glucose more often. ? Follow your sick day plan whenever you cannot eat or drink normally. Make this plan in advance with your health care provider.  Share your diabetes management plan with people in your workplace, school, and household.  Check your urine for ketones when you are ill and as told by your health care provider.  Carry a medical alert card or wear medical alert jewelry. If You Have Reactive Hypoglycemia or Low Blood Sugar From Other Causes:  Monitor your blood glucose as told by your health care provider.  Follow instructions from your health care provider about eating or drinking restrictions. Contact a health care provider if:  You have problems keeping your blood glucose in your target range.  You have frequent episodes of hypoglycemia. Get help right away if:  You continue to have hypoglycemia symptoms after eating or drinking something containing glucose.  Your blood glucose is at or below 54 mg/dL (3 mmol/L).  You have a seizure.  You faint. These symptoms may represent a serious problem that is an emergency. Do not wait to see if the symptoms will go away. Get medical help right away. Call your local emergency services (911 in the [place] Do not drive yourself to the  hospital. This information is not intended to replace advice given to you by your health care provider. Make sure you discuss any questions you have with your health care provider. Document Released: 01/19/2005 Document Revised: 07/03/2015 Document Reviewed: 02/22/2015 Elsevier Interactive Patient Education  [employer].

## 2017-04-07 NOTE — Telephone Encounter (Signed)
Transition Care Management Follow-up Telephone Call   Date discharged? 04/06/2017    How have you been since you were released from the hospital? Per pt she is doing better than she was when she went to hospital on Sunday.  Per pt sugar was 299 this morning and then down to 197 and now down to 67.  Pt advises they thought she had pneumonia as it was hard to breathe. Pt states her  Lungs felt heavy but she feels better.   Do you understand why you were in the hospital? yes   Do you understand the discharge instructions? yes   Where were you discharged to? Per pt she was discharged to her home.   Items Reviewed:  Medications reviewed: yes  Allergies reviewed: yes  Dietary changes reviewed: yes  Referrals reviewed: yes   Functional Questionnaire:   Activities of Daily Living (ADLs):   She states they are independent in the following: continence, grooming, toileting and dressing States they require assistance with the following: ambulation and bathing and hygiene   Any transportation issues/concerns?: no   Any patient concerns? no   Confirmed importance and date/time of follow-up visits scheduled yes  Provider Appointment booked with  Confirmed with patient if condition begins to worsen call PCP or go to the ER.  Patient was given the office number and encouraged to call back with question or concerns.  : yes

## 2017-04-07 NOTE — Progress Notes (Signed)
TRANSITION OF CARE VISIT   Primary Care Physician (PCP): Forrest Moron, MD                                                    [address], [place] [address]  Date of Admission: 04/04/17  Date of Discharge: 04/06/17  Discharged from: [facility]  Discharge Diagnosis: CAP, cough and fever  Summary of Admission: pt was admitted with fever, fatigue, body aches for 24 hours who required supplemental oxygen in the ED of 2L. CXR showed mild increased interstitial and patchy airspace disease at bases which could have been atelectasis vs. Infiltrates.  Pt flu neg. She was given Rocephin and zpak in the ER to cover for CAP. She also had procalcitonin levels that were elevated. She was discharged home with plan for [facility] Follow Up. She was also discharged home with a walker.  TODAY's VISIT  Patient/Caregiver self-reported problems/concerns:  CAP Her breathing has improved She is completing the antibiotic She denies wheezing, shortness of breath She does not require home oxygen No fevers or chills   Hypoglycemia Since being discharged she was having sugars that are running low compared to her normal levels. She does not get any symptoms other than fatigue and usually it rebounds after eating.   04/06/17 299  04/07/17  67 patient ate yogurt, bananas 04/07/17  219  After eating yogurt and bananas 04/07/17  87   11:30am  Her daughter states that she is taking all the medications as instructed but also eating much less than she used to and is still eating pop tarts as a meal.  She is not eating much protein.   OSA NOT ON CPAP  While in the hospital she was advised to use her cpap  The patient reports that she never got a cpap machine but has had a sleep study She also does not know her settings She had sleep study back in 2017  Liver mass She also has a liver mass that was noted 09/2016 on ct abd and [place] abd She  denies any pain She has never had it worked up because she has been in and out of the hospital   Past Medical History:  Diagnosis Date  . Allergy   . Arthritis   . Chronic kidney disease   . Chronic pain   . Coronary artery disease   . Diabetes mellitus without complication ([place])   . Dyspnea   . GERD (gastroesophageal reflux disease)   . Heart murmur   . Hypertension   . Liver disease   . NASH (nonalcoholic steatohepatitis) 09/16/2016  . Obstructive sleep apnea   . Osteomyelitis ([place])   . Peripheral vascular disease ([place])   . Sleep apnea   . Stroke ([place])   . Ulcers of both great toes ([place]) 11/07/2016   right great toe, started as finger nail size and grew in 6 days    Allergies  Allergen Reactions  . Lasix [Furosemide]     Dehydration leading to kidney  failure   . Norvasc [Amlodipine Besylate]     Severe edema  . Prednisone Other (See Comments)    Ketoacidosis  . Vancomycin Other (See Comments)    Total organ shut down  . Doxycycline Hives  . Clindamycin/Lincomycin Hives and Itching  . Ibuprofen Other (See Comments)    WAS TOLD TO NOT TAKE THIS BECAUSE OF HER KIDNEYS AND LIVER  . Inderal [Propranolol] Cough  . Lisinopril Other (See Comments)    WAS TOLD TO NOT TAKE THIS BECAUSE OF HER KIDNEYS AND LIVER  . Lovastatin Other (See Comments)    WAS TOLD TO NOT TAKE THIS BECAUSE OF HER KIDNEYS AND LIVER  . Norvasc [Amlodipine Besylate] Other (See Comments)    Severe edema  . Nsaids Other (See Comments)    WAS TOLD TO NOT TAKE THIS BECAUSE OF HER KIDNEYS AND LIVER  . Sulfa Antibiotics Nausea Only   Past Surgical History:  Procedure Laterality Date  . ABDOMINAL HYSTERECTOMY    . BACK SURGERY    . CESAREAN SECTION    . CHOLECYSTECTOMY    . HERNIA REPAIR    . JOINT REPLACEMENT    . KNEE SURGERY    . SPINE SURGERY    . TOE AMPUTATION    . TOE AMPUTATION Bilateral    3 on right foot 2 left foot   . TONSILLECTOMY    . TUBAL LIGATION      MEDICATIONS Current  Outpatient Medications  Medication Sig Dispense Refill  . atorvastatin (LIPITOR) 80 MG tablet Take 1 tablet (80 mg total) by mouth daily at 6 PM. 90 tablet 3  . BD ULTRA-FINE PEN NEEDLES 29G X 12.7MM MISC   0  . busPIRone (BUSPAR) 15 MG tablet Take 1 tablet (15 mg total) by mouth 3 (three) times daily. 90 tablet 3  . carvedilol (COREG) 25 MG tablet Take 1 tablet (25 mg total) by mouth 2 (two) times daily. 180 tablet 1  . cefdinir (OMNICEF) 125 MG/5ML suspension Take 12 mLs (300 mg total) by mouth 2 (two) times daily for 5 days. 100 mL 0  . clopidogrel (PLAVIX) 75 MG tablet Take 1 tablet (75 mg total) by mouth daily. 90 tablet 3  . exenatide (BYETTA 5 MCG PEN) 5 MCG/0.02ML SOPN injection Inject 0.02 mLs (5 mcg total) into the skin 2 (two) times daily with a meal. 1.2 mL 3  . ezetimibe (ZETIA) 10 MG tablet Take 1 tablet (10 mg total) by mouth daily. 90 tablet 3  . gabapentin (NEURONTIN) 300 MG capsule Take 1 capsule (300 mg total) by mouth 3 (three) times daily. 90 capsule 3  . insulin aspart (NOVOLOG) 100 UNIT/ML FlexPen Inject 10 Units into the skin 3 (three) times daily before meals. "PER A SLIDING SCALE OF AN ADDITIONAL 2-10 UNITS" 15 mL 3  . LANTUS SOLOSTAR 100 UNIT/ML Solostar Pen Inject 200 Units into the skin every morning. 25 pen 11  . NUCYNTA 75 MG tablet Take 75 mg by mouth 3 (three) times daily.  0  . ranolazine (RANEXA) 500 MG 12 hr tablet Take 1 tablet (500 mg total) by mouth 2 (two) times daily. 60 tablet 6  . sertraline (ZOLOFT) 50 MG tablet Take 1 tablet (50 mg total) by mouth daily. 90 tablet 1  . tiZANidine (ZANAFLEX) 4 MG tablet Take 4 mg by mouth 3 (three) times daily.     No current facility-administered medications for this visit.     Medication Reconciliation conducted with patient/caregiver? (Yes/ No):yes  New medications prescribed/discontinued upon discharge? (Yes/No): yes  Barriers identified related to medications: none  LABS  Lab Reviewed (Yes/No/NA): yes Lab  Results  Component Value Date   WBC 4.0 04/06/2017   HGB 11.5 (L) 04/06/2017   HCT 35.6 (L) 04/06/2017   MCV 84.6 04/06/2017   PLT 147 (L) 04/06/2017   Lab Results  Component Value Date   CREATININE 1.56 (H) 04/06/2017     Chemistry      Component Value Date/Time   NA 138 04/06/2017 0639   NA 143 03/26/2017 1349   K 3.7 04/06/2017 0639   CL 107 04/06/2017 0639   CO2 22 04/06/2017 0639   BUN 25 (H) 04/06/2017 0639   BUN 17 03/26/2017 1349   CREATININE 1.56 (H) 04/06/2017 0639      Component Value Date/Time   CALCIUM 8.3 (L) 04/06/2017 0639   ALKPHOS 115 04/04/2017 1552   AST 45 (H) 04/04/2017 1552   ALT 22 04/04/2017 1552   BILITOT 1.6 (H) 04/04/2017 1552   BILITOT 0.5 03/26/2017 1349     Lab Results  Component Value Date   HGBA1C 12.9 03/26/2017     PHYSICAL EXAM:  Vitals:   04/07/17 1500  BP: (!) 150/70  Pulse: 94  Temp: 98.6 F (37 C)  TempSrc: Oral  SpO2: 97%  Weight: 234 lb 3.2 oz (106.2 kg)  Height: 5' 7"  (1.702 m)   Wt Readings from Last 3 Encounters:  04/07/17 234 lb 3.2 oz (106.2 kg)  04/05/17 235 lb 0.2 oz (106.6 kg)  03/29/17 237 lb (107.5 kg)    General: alert, oriented, in NAD Head: normocephalic, atraumatic, no sinus tenderness Eyes: EOM intact, no scleral icterus or conjunctival injection Ears: TM clear bilaterally Nose: mucosa nonerythematous, nonedematous Throat: no pharyngeal exudate or erythema Lymph: no posterior auricular, submental or cervical lymph adenopathy Heart: normal rate, normal sinus rhythm, no murmurs Lungs: clear to auscultation bilaterally, no wheezing Abdomen: obese, nontender, normoactive bowel sounds, no rebound or guarding   Lab Results  Component Value Date   HGBA1C 12.9 03/26/2017    IMPRESSION: 1. Habitus limited examination.  2. Irregular echogenic lesion corresponding to CT abnormality, this could represent blood products, hemangioma, less likely tumor. Recommend contrast-enhanced cross-sectional  imaging (MRI preferable if patient spinal stimulator is MR compatible) and when patient's kidney function improves.  3. Splenomegaly.   Electronically Signed   By: Elon Alas M.D.   On: 09/20/2016 06:50 IMPRESSION: 1. Irregular peripheral hepatic masses. Mild perihepatic lymphadenopathy. Differential diagnosis includes infarcts, infection, atypical hemangiomas and though less likely, metastasis. Given renal failure and, spinal stimulator, recommend ultrasound. 2. Status post cholecystectomy.  Normal appendix.  Aortic Atherosclerosis (ICD10-I70.0).   Electronically Signed   By: Elon Alas M.D.   On: 09/20/2016 00:43  ASSESSMENT:   [name] was seen today for transitions of care.  Diagnoses and all orders for this visit:  Hospital discharge follow-up- discussed hospital course, discharge and follow up Discussed ways to decrease hospitalization   Liver mass- will refer to Gastroenterology for evaluation -     Ambulatory referral to Gastroenterology  OSA (obstructive sleep apnea)- will get previous sleep study and refer to Neurology -     Ambulatory referral to Neurology  Uncontrolled type 2 diabetes mellitus with hyperglycemia ([place]) Poor nutrition She will increase her protein in the form of chicken, egg whites, shrimp She has appt with Endocrinology 04/29/17   Pneumonia of both lower lobes due to infectious organism [facility]) - community acquired pneumonia On  antibiotics Labile sugars could be due to infection     PATIENT EDUCATION PROVIDED: See AVS   FOLLOW-UP (Include any further testing or referrals): Referral Placed for GI Home health already ordered   Complexity of Medical Decision Making:  High Complexity  Follow up in one month

## 2017-04-08 ENCOUNTER — Encounter (INDEPENDENT_AMBULATORY_CARE_PROVIDER_SITE_OTHER): Payer: Self-pay | Admitting: Orthopedic Surgery

## 2017-04-08 ENCOUNTER — Ambulatory Visit (INDEPENDENT_AMBULATORY_CARE_PROVIDER_SITE_OTHER): Payer: Medicare HMO | Admitting: Orthopedic Surgery

## 2017-04-08 VITALS — Wt 234.0 lb

## 2017-04-08 DIAGNOSIS — L97511 Non-pressure chronic ulcer of other part of right foot limited to breakdown of skin: Secondary | ICD-10-CM

## 2017-04-08 DIAGNOSIS — Z794 Long term (current) use of insulin: Secondary | ICD-10-CM

## 2017-04-08 DIAGNOSIS — E1165 Type 2 diabetes mellitus with hyperglycemia: Secondary | ICD-10-CM

## 2017-04-08 DIAGNOSIS — IMO0002 Reserved for concepts with insufficient information to code with codable children: Secondary | ICD-10-CM

## 2017-04-08 DIAGNOSIS — E1142 Type 2 diabetes mellitus with diabetic polyneuropathy: Secondary | ICD-10-CM

## 2017-04-08 NOTE — Progress Notes (Signed)
Office Visit Note   Patient: [name]           Date of Birth: [birth year]           MRN: [id number] Visit Date: 04/08/2017              Requested by: Forrest Moron, MD [place], [place] [address] PCP: Forrest Moron, MD  Chief Complaint  Patient presents with  . Right Foot - Wound Check    Wound 1st MT head  . Left Foot - Wound Check    Great toe      HPI: Patient is a 62 year old woman with diabetic insensate neuropathy she is status post partial ray amputations of both feet partial amputation of the great toe on the left and the second third toe amputations on the right.  She has hallux valgus deformity of the right great toe with a Waggoner grade 1 ulcer beneath the first metatarsal head on the right and an ulcer beneath the residual proximal phalanx of the left foot.  Patient is worn out orthotics.  Most recent hemoglobin A1c approximately 12.9. Assessment & Plan: Visit Diagnoses:  1. 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 Uncontrolled type 2 diabetes mellitus with diabetic polyneuropathy, with long-term current use of insulin (HCC)     Plan: Diabetic insensate neuropathy Waggoner grade 1 ulcers bilaterally.  Plan we will place a felt relieving pads around the left great toe and right great toe to unload the ulcers she will use her Aquacel dressings that she has at home reevaluate in 3 weeks.   Follow-Up Instructions: Return in about 3 weeks (around 04/29/2017).   Ortho Exam  Patient is alert, oriented, no adenopathy, well-dressed, normal affect, normal respiratory effort. Patient has an antalgic gait.  She has good dorsiflexion of both ankles she has a good dorsalis pedis and posterior tibial pulse bilaterally.  She is a Psychologist, educational grade 1 ulcer beneath the proximal phalanx of the left great toe and beneath the MTP joint of the right great toe.  After informed consent a 10 blade knife was used to  debride the skin and soft tissue back to bleeding viable granulation tissue this was touched with silver nitrate there was no exposed bone or tendon.  The left great toe ulcer is 5 mm in diameter 3 mm deep to the right great toe ulcer is 3 cm in diameter and 5 mm deep.  There is no ascending cellulitis no odor no drainage.  Imaging: No results found. No images are attached to the encounter.  Labs: Lab Results  Component Value Date   HGBA1C 12.9 03/26/2017   HGBA1C 12.4 (H) 01/02/2017   HGBA1C 12.7 11/23/2016   ESRSEDRATE 39 (H) 12/05/2016   CRP 3.7 (H) 12/05/2016   REPTSTATUS 04/06/2017 FINAL 04/04/2017   CULT  04/04/2017    NO GROWTH Performed at [facility], [place] [address] [place], [place] [address]    LABORGA ESCHERICHIA COLI (A) 10/28/2016    @LABSALLVALUES (HGBA1)@  Body mass index is 36.65 kg/m.  Orders:  No orders of the defined types were placed in this encounter.  No orders of the defined types were placed in this encounter.    Procedures: No procedures performed  Clinical Data: No additional findings.  ROS:  All other systems negative, except as noted in the HPI. Review of Systems  Objective: Vital Signs: Wt 234 lb (106.1 kg)  BMI 36.65 kg/m   Specialty Comments:  No specialty comments available.  PMFS History: Patient Active Problem List   Diagnosis Date Noted  . Community acquired pneumonia 04/04/2017  . Chest pain 03/08/2017  . Aortic atherosclerosis ([place]) 01/13/2017  . At high risk for falls 01/13/2017  . Uncontrolled type 2 diabetes mellitus with hyperglycemia ([place]) 01/13/2017  . Stroke-like symptoms   . Complicated migraine   . Mixed hyperlipidemia   . Generalized weakness   . TIA (transient ischemic attack) 01/02/2017  . Ulcer of toe of right foot, limited to breakdown of skin ([place]) 12/18/2016  . Non-pressure chronic ulcer of other part of right foot limited to breakdown of skin ([place]) 12/07/2016  . Diabetic foot ulcer ([place])  12/04/2016  . Hypovolemia 12/04/2016  . Chronic pain syndrome 12/04/2016  . Diabetic peripheral neuropathy ([place]) 12/04/2016  . Hypotension   . Liver mass 09/20/2016  . Kidney disease, chronic, stage III (GFR 30-59 ml/min) (HCC) 12/17/2015  . Diabetes mellitus ([place]) 07/18/2015  . Essential hypertension 06/06/2015  . Chronic back pain 07/27/2012  . OSA on CPAP 03/21/2012   Past Medical History:  Diagnosis Date  . Allergy   . Arthritis   . Chronic kidney disease   . Chronic pain   . Coronary artery disease   . Diabetes mellitus without complication ([place])   . Dyspnea   . GERD (gastroesophageal reflux disease)   . Heart murmur   . Hypertension   . Liver disease   . NASH (nonalcoholic steatohepatitis) 09/16/2016  . Obstructive sleep apnea   . Osteomyelitis ([place])   . Peripheral vascular disease ([place])   . Sleep apnea   . Stroke ([place])   . Ulcers of both great toes ([place]) 11/07/2016   right great toe, started as finger nail size and grew in 6 days     Family History  Problem Relation Age of Onset  . Cancer Mother        Thymoma, metastatic  . Heart disease Father   . Stroke Father   . Stroke Maternal Grandfather   . Heart disease Paternal Grandfather   . Multiple sclerosis Sister   . Epilepsy Sister   . Diabetes Neg Hx     Past Surgical History:  Procedure Laterality Date  . ABDOMINAL HYSTERECTOMY    . BACK SURGERY    . CESAREAN SECTION    . CHOLECYSTECTOMY    . HERNIA REPAIR    . JOINT REPLACEMENT    . KNEE SURGERY    . SPINE SURGERY    . TOE AMPUTATION    . TOE AMPUTATION Bilateral    3 on right foot 2 left foot   . TONSILLECTOMY    . TUBAL LIGATION     Social History   Occupational History  . Not on file  Tobacco Use  . Smoking status: Never Smoker  . Smokeless tobacco: Never Used  Substance and Sexual Activity  . Alcohol use: No  . Drug use: No  . Sexual activity: No

## 2017-04-08 NOTE — Telephone Encounter (Signed)
Patient seen and TOC office visit completed.

## 2017-04-09 LAB — CULTURE, BLOOD (ROUTINE X 2)
Culture: NO GROWTH
Culture: NO GROWTH
SPECIAL REQUESTS: ADEQUATE
SPECIAL REQUESTS: ADEQUATE

## 2017-04-12 ENCOUNTER — Telehealth: Payer: Self-pay

## 2017-04-12 ENCOUNTER — Telehealth: Payer: Self-pay | Admitting: Family Medicine

## 2017-04-12 ENCOUNTER — Ambulatory Visit: Payer: Medicare HMO | Admitting: Adult Health

## 2017-04-12 ENCOUNTER — Encounter: Payer: Self-pay | Admitting: Adult Health

## 2017-04-12 NOTE — Telephone Encounter (Signed)
Copied from [place]. Topic: Quick Communication - See Telephone Encounter >> Apr 12, 2017 11:13 AM [place], Laban Emperor wrote: CRM for notification. See Telephone encounter for: Dan Europe from [facility] to request verbal orders: PT 1 week 1, 2 week 4, skilled nursing and OT evaluation.  Contact [phone] (ok to leave VM)  04/12/17.

## 2017-04-12 NOTE — Telephone Encounter (Signed)
Patient cancel 2nd no show.

## 2017-04-12 NOTE — Telephone Encounter (Signed)
Phone call to [place]. Verbal orders relayed for PT 1 week 1, 2 week 4, skilled nursing and OT evaluation. Tatiana verbalizes understanding.

## 2017-04-13 ENCOUNTER — Ambulatory Visit (INDEPENDENT_AMBULATORY_CARE_PROVIDER_SITE_OTHER): Payer: Medicare HMO | Admitting: Cardiology

## 2017-04-13 ENCOUNTER — Encounter: Payer: Self-pay | Admitting: Cardiology

## 2017-04-13 VITALS — BP 130/70 | HR 91 | Ht 66.0 in | Wt 233.0 lb

## 2017-04-13 DIAGNOSIS — I700 Atherosclerosis of aorta: Secondary | ICD-10-CM

## 2017-04-13 DIAGNOSIS — E1122 Type 2 diabetes mellitus with diabetic chronic kidney disease: Secondary | ICD-10-CM

## 2017-04-13 DIAGNOSIS — I10 Essential (primary) hypertension: Secondary | ICD-10-CM

## 2017-04-13 DIAGNOSIS — N183 Chronic kidney disease, stage 3 unspecified: Secondary | ICD-10-CM

## 2017-04-13 DIAGNOSIS — I209 Angina pectoris, unspecified: Secondary | ICD-10-CM | POA: Diagnosis not present

## 2017-04-13 DIAGNOSIS — Z794 Long term (current) use of insulin: Secondary | ICD-10-CM | POA: Diagnosis not present

## 2017-04-13 MED ORDER — RANOLAZINE ER 1000 MG PO TB12: 500.0000 mg | ORAL_TABLET | Freq: Two times a day (BID) | ORAL | 3 refills | Status: DC

## 2017-04-13 NOTE — Patient Instructions (Signed)
Medication Instructions:  Your physician has recommended you make the following change in your medication:  INCREASE ranolazine (ranexa) 1000 mg twice daily  Labwork: None  Testing/Procedures: None  Follow-Up: Your physician recommends that you schedule a follow-up appointment in: 1 month.  Any Other Special Instructions Will Be Listed Below (If Applicable).     If you need a refill on your cardiac medications before your next appointment, please call your pharmacy.

## 2017-04-13 NOTE — Addendum Note (Signed)
Addended by: Austin Miles on: 04/13/2017 10:41 AM   Modules accepted: Orders

## 2017-04-13 NOTE — Progress Notes (Signed)
Cardiology Office Note:    Date:  04/13/2017   ID:  [name], DOB [birth year], MRN [id number]  PCP:  Forrest Moron, MD  Cardiologist:  Jenne Campus, MD    Referring MD: Forrest Moron, MD   Chief Complaint  Patient presents with  . 1 month follow up  Doing better  History of Present Illness:    [name] is a 62 y.o. female with multiple risk factors for coronary artery disease. 3.  Stress is before as well as cardiac catheterization apparently or were negative but still does have some exertional tightness in the chest.  I gave her a ranolazine 500 mg and she reports 40-50% improvement.  I will increase the dose to thousand grams twice daily.  In the meantime she ended up going to hospital because of pneumonia spent there about 2-3 days.  Discharged home doing better right now.  Still some issue with hypoglycemia.  Some issue with shortness of breath but overall seems to be holding quite okay.  Blood pressure appears to be well controlled now.  Past Medical History:  Diagnosis Date  . Allergy   . Arthritis   . Chronic kidney disease   . Chronic pain   . Coronary artery disease   . Diabetes mellitus without complication ([place])   . Dyspnea   . GERD (gastroesophageal reflux disease)   . Heart murmur   . Hypertension   . Liver disease   . NASH (nonalcoholic steatohepatitis) 09/16/2016  . Obstructive sleep apnea   . Osteomyelitis ([place])   . Peripheral vascular disease ([place])   . Sleep apnea   . Stroke ([place])   . Ulcers of both great toes ([place]) 11/07/2016   right great toe, started as finger nail size and grew in 6 days     Past Surgical History:  Procedure Laterality Date  . ABDOMINAL HYSTERECTOMY    . BACK SURGERY    . CESAREAN SECTION    . CHOLECYSTECTOMY    . HERNIA REPAIR    . JOINT REPLACEMENT    . KNEE SURGERY    . SPINE SURGERY    . TOE AMPUTATION    . TOE AMPUTATION Bilateral    3 on right foot 2 left foot   . TONSILLECTOMY    . TUBAL  LIGATION      Current Medications: Current Meds  Medication Sig  . atorvastatin (LIPITOR) 80 MG tablet Take 1 tablet (80 mg total) by mouth daily at 6 PM.  . BD ULTRA-FINE PEN NEEDLES 29G X 12.7MM MISC   . busPIRone (BUSPAR) 15 MG tablet Take 1 tablet (15 mg total) by mouth 3 (three) times daily.  . carvedilol (COREG) 25 MG tablet Take 1 tablet (25 mg total) by mouth 2 (two) times daily.  . cefdinir (OMNICEF) 250 MG/5ML suspension Take 6 mLs by mouth 2 (two) times daily.  . clopidogrel (PLAVIX) 75 MG tablet Take 1 tablet (75 mg total) by mouth daily.  Marland Kitchen exenatide (BYETTA 5 MCG PEN) 5 MCG/0.02ML SOPN injection Inject 0.02 mLs (5 mcg total) into the skin 2 (two) times daily with a meal.  . ezetimibe (ZETIA) 10 MG tablet Take 1 tablet (10 mg total) by mouth daily.  Marland Kitchen gabapentin (NEURONTIN) 300 MG capsule Take 1 capsule (300 mg total) by mouth 3 (three) times daily.  . insulin aspart (NOVOLOG) 100 UNIT/ML FlexPen Inject 10 Units into the skin 3 (three) times daily before meals. "PER A SLIDING SCALE OF AN ADDITIONAL 2-10 UNITS"  .  LANTUS SOLOSTAR 100 UNIT/ML Solostar Pen Inject 200 Units into the skin every morning.  . NUCYNTA 75 MG tablet Take 75 mg by mouth 3 (three) times daily.  . ranolazine (RANEXA) 500 MG 12 hr tablet Take 1 tablet (500 mg total) by mouth 2 (two) times daily.  . sertraline (ZOLOFT) 50 MG tablet Take 1 tablet (50 mg total) by mouth daily.  Marland Kitchen tiZANidine (ZANAFLEX) 4 MG tablet Take 4 mg by mouth 3 (three) times daily.     Allergies:   Lasix [furosemide]; Norvasc [amlodipine besylate]; Prednisone; Vancomycin; Doxycycline; Clindamycin/lincomycin; Ibuprofen; Inderal [propranolol]; Lisinopril; Lovastatin; Norvasc [amlodipine besylate]; Nsaids; and Sulfa antibiotics   Social History   Socioeconomic History  . Marital status: Divorced    Spouse name: None  . Number of children: None  . Years of education: None  . Highest education level: None  Social Needs  . Financial  resource strain: None  . Food insecurity - worry: None  . Food insecurity - inability: None  . Transportation needs - medical: None  . Transportation needs - non-medical: None  Occupational History  . None  Tobacco Use  . Smoking status: Never Smoker  . Smokeless tobacco: Never Used  Substance and Sexual Activity  . Alcohol use: No  . Drug use: No  . Sexual activity: No  Other Topics Concern  . None  Social History Narrative   ** Merged History Encounter **         Family History: The patient's family history includes Cancer in her mother; Epilepsy in her sister; Heart disease in her father and paternal grandfather; Multiple sclerosis in her sister; Stroke in her father and maternal grandfather. There is no history of Diabetes. ROS:   Please see the history of present illness.    All 14 point review of systems negative except as described per history of present illness  EKGs/Labs/Other Studies Reviewed:      Recent Labs: 04/04/2017: ALT 22 04/06/2017: BUN 25; Creatinine, Ser 1.56; Hemoglobin 11.5; Platelets 147; Potassium 3.7; Sodium 138  Recent Lipid Panel    Component Value Date/Time   CHOL 245 (H) 01/03/2017 0258   TRIG 603 (H) 01/03/2017 0258   HDL 21 (L) 01/03/2017 0258   CHOLHDL 11.7 01/03/2017 0258   VLDL UNABLE TO CALCULATE IF TRIGLYCERIDE OVER 400 mg/dL 01/03/2017 0258   LDLCALC UNABLE TO CALCULATE IF TRIGLYCERIDE OVER 400 mg/dL 01/03/2017 0258    Physical Exam:    VS:  BP 130/70   Pulse 91   Ht 5' 6"  (1.676 m)   Wt 233 lb (105.7 kg)   SpO2 96%   BMI 37.61 kg/m     Wt Readings from Last 3 Encounters:  04/13/17 233 lb (105.7 kg)  04/08/17 234 lb (106.1 kg)  04/07/17 234 lb 3.2 oz (106.2 kg)     GEN:  Well nourished, well developed in no acute distress HEENT: Normal NECK: No JVD; No carotid bruits LYMPHATICS: No lymphadenopathy CARDIAC: RRR, no murmurs, no rubs, no gallops RESPIRATORY:  Clear to auscultation without rales, wheezing or rhonchi    ABDOMEN: Soft, non-tender, non-distended MUSCULOSKELETAL:  No edema; No deformity  SKIN: Warm and dry LOWER EXTREMITIES: no swelling NEUROLOGIC:  Alert and oriented x 3 PSYCHIATRIC:  Normal affect   ASSESSMENT:    1. Aortic atherosclerosis ([place])   2. Essential hypertension   3. Type 2 diabetes mellitus with stage 3 chronic kidney disease, with long-term current use of insulin ([place])   4. Angina pectoris ([place])  PLAN:    In order of problems listed above:  1. Angina pectoris: We will increase dose of ranolazine from 502,000 g twice daily rest of the medication will be the same.  I see her back in a month to see how she does. 2. Essential hypertension: Well controlled we will continue present management. 3. Type 2 diabetes: Reports to have some hypoglycemic episodes.  We will check her sugar today   Medication Adjustments/Labs and Tests Ordered: Current medicines are reviewed at length with the patient today.  Concerns regarding medicines are outlined above.  No orders of the defined types were placed in this encounter.  Medication changes: No orders of the defined types were placed in this encounter.   Signed, Park Liter, MD, [facility] 04/13/2017 10:32 AM    [place]

## 2017-04-20 ENCOUNTER — Ambulatory Visit: Payer: Medicare HMO | Admitting: Adult Health

## 2017-04-20 ENCOUNTER — Telehealth: Payer: Self-pay | Admitting: Family Medicine

## 2017-04-20 NOTE — Telephone Encounter (Signed)
Copied from [place]. Topic: General - Other >> Apr 20, 2017  4:13 PM Darl Householder, RMA wrote: Reason for CRM: Patient is requesting a refill for  BD ULTRA-FINE PEN NEEDLES 29G X 12.7MM MISC to be sent to [facility] [place]

## 2017-04-21 MED ORDER — BD PEN NEEDLE ORIGINAL U/F 29G X 12.7MM MISC: 0 refills | Status: DC

## 2017-04-23 ENCOUNTER — Encounter: Payer: Self-pay | Admitting: Adult Health

## 2017-04-23 ENCOUNTER — Ambulatory Visit: Payer: Medicare HMO | Admitting: Adult Health

## 2017-04-23 ENCOUNTER — Ambulatory Visit
Admission: RE | Admit: 2017-04-23 | Discharge: 2017-04-23 | Disposition: A | Discharge status: Home / Self Care | Payer: Medicare HMO | Source: Ambulatory Visit | Attending: Adult Health | Admitting: Adult Health

## 2017-04-23 VITALS — BP 104/64 | HR 86 | Ht 66.0 in | Wt 218.8 lb

## 2017-04-23 DIAGNOSIS — I10 Essential (primary) hypertension: Secondary | ICD-10-CM | POA: Diagnosis not present

## 2017-04-23 DIAGNOSIS — E1165 Type 2 diabetes mellitus with hyperglycemia: Secondary | ICD-10-CM | POA: Diagnosis not present

## 2017-04-23 DIAGNOSIS — W19XXXA Unspecified fall, initial encounter: Secondary | ICD-10-CM

## 2017-04-23 DIAGNOSIS — E785 Hyperlipidemia, unspecified: Secondary | ICD-10-CM

## 2017-04-23 DIAGNOSIS — E1142 Type 2 diabetes mellitus with diabetic polyneuropathy: Secondary | ICD-10-CM

## 2017-04-23 NOTE — Patient Instructions (Addendum)
Continue clopidogrel 75 mg daily  and lipitor and zetia  for secondary stroke prevention  Can add CoQ10 200 mg for statin muscle aches  Continue to follow up with PCP regarding your cholesterol and blood pressure  Continue to follow up endocrinologist regarding diabetes  Check your A1c and cholesterol levels today - I will call you with the results on Monday   Go to [facility] for xray of knee    Maintain strict control of hypertension with blood pressure goal below 130/90, diabetes with hemoglobin A1c goal below 6.5% and cholesterol with LDL cholesterol (bad cholesterol) goal below 70 mg/dL. I also advised the patient to eat a healthy diet with plenty of whole grains, cereals, fruits and vegetables, exercise regularly and maintain ideal body weight.  Followup in the future with me in 4 months     Diabetes Mellitus and Nutrition When you have diabetes (diabetes mellitus), it is very important to have healthy eating habits because your blood sugar (glucose) levels are greatly affected by what you eat and drink. Eating healthy foods in the appropriate amounts, at about the same times every day, can help you:  Control your blood glucose.  Lower your risk of heart disease.  Improve your blood pressure.  Reach or maintain a healthy weight.  Every person with diabetes is different, and each person has different needs for a meal plan. Your health care provider may recommend that you work with a diet and nutrition specialist (dietitian) to make a meal plan that is best for you. Your meal plan may vary depending on factors such as:  The calories you need.  The medicines you take.  Your weight.  Your blood glucose, blood pressure, and cholesterol levels.  Your activity level.  Other health conditions you have, such as heart or kidney disease.  How do carbohydrates affect me? Carbohydrates affect your blood glucose level more than any other type of food. Eating  carbohydrates naturally increases the amount of glucose in your blood. Carbohydrate counting is a method for keeping track of how many carbohydrates you eat. Counting carbohydrates is important to keep your blood glucose at a healthy level, especially if you use insulin or take certain oral diabetes medicines. It is important to know how many carbohydrates you can safely have in each meal. This is different for every person. Your dietitian can help you calculate how many carbohydrates you should have at each meal and for snack. Foods that contain carbohydrates include:  Bread, cereal, rice, pasta, and crackers.  Potatoes and corn.  Peas, beans, and lentils.  Milk and yogurt.  Fruit and juice.  Desserts, such as cakes, cookies, ice cream, and candy.  How does alcohol affect me? Alcohol can cause a sudden decrease in blood glucose (hypoglycemia), especially if you use insulin or take certain oral diabetes medicines. Hypoglycemia can be a life-threatening condition. Symptoms of hypoglycemia (sleepiness, dizziness, and confusion) are similar to symptoms of having too much alcohol. If your health care provider says that alcohol is safe for you, follow these guidelines:  Limit alcohol intake to no more than 1 drink per day for nonpregnant women and 2 drinks per day for men. One drink equals 12 oz of beer, 5 oz of wine, or 1 oz of hard liquor.  Do not drink on an empty stomach.  Keep yourself hydrated with water, diet soda, or unsweetened iced tea.  Keep in mind that regular soda, juice, and other mixers may contain a lot of sugar and  must be counted as carbohydrates.  What are tips for following this plan? Reading food labels  Start by checking the serving size on the label. The amount of calories, carbohydrates, fats, and other nutrients listed on the label are based on one serving of the food. Many foods contain more than one serving per package.  Check the total grams (g) of  carbohydrates in one serving. You can calculate the number of servings of carbohydrates in one serving by dividing the total carbohydrates by 15. For example, if a food has 30 g of total carbohydrates, it would be equal to 2 servings of carbohydrates.  Check the number of grams (g) of saturated and trans fats in one serving. Choose foods that have low or no amount of these fats.  Check the number of milligrams (mg) of sodium in one serving. Most people should limit total sodium intake to less than 2,300 mg per day.  Always check the nutrition information of foods labeled as "low-fat" or "nonfat". These foods may be higher in added sugar or refined carbohydrates and should be avoided.  Talk to your dietitian to identify your daily goals for nutrients listed on the label. Shopping  Avoid buying canned, premade, or processed foods. These foods tend to be high in fat, sodium, and added sugar.  Shop around the outside edge of the grocery store. This includes fresh fruits and vegetables, bulk grains, fresh meats, and fresh dairy. Cooking  Use low-heat cooking methods, such as baking, instead of high-heat cooking methods like deep frying.  Cook using healthy oils, such as olive, canola, or sunflower oil.  Avoid cooking with butter, cream, or high-fat meats. Meal planning  Eat meals and snacks regularly, preferably at the same times every day. Avoid going long periods of time without eating.  Eat foods high in fiber, such as fresh fruits, vegetables, beans, and whole grains. Talk to your dietitian about how many servings of carbohydrates you can eat at each meal.  Eat 4-6 ounces of lean protein each day, such as lean meat, chicken, fish, eggs, or tofu. 1 ounce is equal to 1 ounce of meat, chicken, or fish, 1 egg, or 1/4 cup of tofu.  Eat some foods each day that contain healthy fats, such as avocado, nuts, seeds, and fish. Lifestyle   Check your blood glucose regularly.  Exercise at least  30 minutes 5 or more days each week, or as told by your health care provider.  Take medicines as told by your health care provider.  Do not use any products that contain nicotine or tobacco, such as cigarettes and e-cigarettes. If you need help quitting, ask your health care provider.  Work with a Social worker or diabetes educator to identify strategies to manage stress and any emotional and social challenges. What are some questions to ask my health care provider?  Do I need to meet with a diabetes educator?  Do I need to meet with a dietitian?  What number can I call if I have questions?  When are the best times to check my blood glucose? Where to find more information:  American Diabetes Association: diabetes.org/food-and-fitness/food  Academy of Nutrition and Dietetics: PokerClues.dk  [employer] of Diabetes and Digestive and Kidney Diseases (NIH): ContactWire.be Summary  A healthy meal plan will help you control your blood glucose and maintain a healthy lifestyle.  Working with a diet and nutrition specialist (dietitian) can help you make a meal plan that is best for you.  Keep in mind  that carbohydrates and alcohol have immediate effects on your blood glucose levels. It is important to count carbohydrates and to use alcohol carefully. This information is not intended to replace advice given to you by your health care provider. Make sure you discuss any questions you have with your health care provider. Document Released: 10/16/2004 Document Revised: 02/24/2016 Document Reviewed: 02/24/2016 Elsevier Interactive Patient Education  [employer].

## 2017-04-23 NOTE — Progress Notes (Signed)
[facility] [address] [place]. [place] [address] [phone]       OFFICE FOLLOW UP NOTE  Ms. Nyeema Hinshaw Date of Birth:  [birth year] Medical Record Number:  [id number]   Reason for Referral:  Hospital TIA vs migraine follow up  CHIEF COMPLAINT:  Chief Complaint  Patient presents with  . New Patient (Initial Visit)    Hospital follow up. Patient still has a lot of weakness. She fell last night and injured her left knee.     HPI: Keylen Woody is being seen today for initial visit in the office for TIA vs migraine on 01/01/17. History obtained from patient and chart review. Reviewed all radiology images and labs personally.  Bird Personsis a 62 y.o.femalewith a history of stroke, OSA, liver disease, HTN, hx of migraine HA, DM, CAD and CKD who presented with left-sided weakness on 01/01/17. Some residual deficits of previous stroke present but c/o worsening of her deficits as well as migraine headache. She stateed that she used to get migraines fairly frequently, but does not have them very often anymore. She has had maybe one since her last stroke and did not have worsening of her deficits with it. Her previous headaches were not as bad as her current one. CT head reviewed and was negative for acute intracranial abnormality. CTA head and neck performed which was unremarkable. 2D echo showed an EF of 60-65%. Unable to have MRI performed as patient has spinal stimulator. LDL was unable to be calculated due to TG 603. A1c 12.4. No antithrombotic was taken prior to admission, recommended patient start clopidogrel 76m daily. No lipid lowering medications were taken prior to admission, recommended patient start lipitor and zetia. Recommended close PCP follow up and DM education for elevated A1c level. Patient was discharged home in stable condition.   Patient comes in today for initial follow-up after hospital admission accompanied by her daughter.  She has had 3  hospital admissions since discharge for infections and low blood pressure.  Continues physical therapy for left-sided weakness.  States she still has increased left-sided weakness but has been improving some.  Complains of headache 2-3 times per week that is accompanied by photophobia and nausea but denies phonophobia and vomiting.  She has had 7-8 hypoglycemia episodes but will be going to see the endocrinologist on 04/29/2017.  She continues to eat high carb items such as pastas, potatoes, large amount of fruit and sugary drinks. At today's visit, both her and her daughter brought in slushys from a gas station.  She states when she had a hypoglycemic episode she was in the car and [facility] then had an issue with her blood sugar being too high.  She is compliant with her diabetic medications.  Blood pressure at today's visit 104/64 and states her blood pressure typically runs SBP 110 - 120 at home.  Continues to take Lipitor and Zetia for cholesterol with mild muscle pain.  Continues to take Plavix with mild bruising but no bleeding.  Patient reports that she had a fall this morning while trying to clean up something on the floor.  She fell directly onto her left knee that left her with adequate amount of swelling and pain over her left knee.  She denies hitting her head or pain in any other location.  Currently is painful for patient to straighten leg completely or put any pressure on the area.  Patient has had sleep apnea study in the past  and was told she has OSA but has not worn her CPAP.  Primary care put in a referral for sleep study to be done. Denies new or worsening stroke/TIA symptoms. Was about sleep exam room, she started stating that she is feeling nauseous and dizzy which she normally feels with a hypoglycemic episode.  She states she feels like this frequently and was drinking on her slushy to help bring her blood sugar back up.  Random glucose level obtained to see if  in fact the symptoms relate to hypoglycemia.  Patient denied staying in the office until she felt better better as she wanted to leave to get food that would help increase her sugar level.  Her daughter was with her during this episode who felt comfortable bringing her home.   ROS:   14 system review of systems performed and negative with exception of weight loss, swelling in her legs, ringing in her ears, cough, snoring, easy bruising, increased thirst, joint pain, cramps, aching muscles, allergies, headache, numbness, weakness, difficulty swallowing, and snoring  PMH:  Past Medical History:  Diagnosis Date  . Allergy   . Arthritis   . Chronic kidney disease   . Chronic pain   . Coronary artery disease   . Diabetes mellitus without complication ([place])   . Dyspnea   . GERD (gastroesophageal reflux disease)   . Heart murmur   . Hypertension   . Liver disease   . NASH (nonalcoholic steatohepatitis) 09/16/2016  . Obstructive sleep apnea   . Osteomyelitis ([place])   . Peripheral vascular disease ([place])   . Sleep apnea   . Stroke ([place])   . Ulcers of both great toes ([place]) 11/07/2016   right great toe, started as finger nail size and grew in 6 days     PSH:  Past Surgical History:  Procedure Laterality Date  . ABDOMINAL HYSTERECTOMY    . BACK SURGERY    . CESAREAN SECTION    . CHOLECYSTECTOMY    . HERNIA REPAIR    . JOINT REPLACEMENT    . KNEE SURGERY    . SPINE SURGERY    . TOE AMPUTATION    . TOE AMPUTATION Bilateral    3 on right foot 2 left foot   . TONSILLECTOMY    . TUBAL LIGATION      Social History:  Social History   Socioeconomic History  . Marital status: Divorced    Spouse name: Not on file  . Number of children: Not on file  . Years of education: Not on file  . Highest education level: Not on file  Occupational History  . Not on file  Social Needs  . Financial resource strain: Not on file  . Food insecurity:    Worry: Not on file    Inability: Not on  file  . Transportation needs:    Medical: Not on file    Non-medical: Not on file  Tobacco Use  . Smoking status: Never Smoker  . Smokeless tobacco: Never Used  Substance and Sexual Activity  . Alcohol use: No  . Drug use: No  . Sexual activity: Never  Lifestyle  . Physical activity:    Days per week: Not on file    Minutes per session: Not on file  . Stress: Not on file  Relationships  . Social connections:    Talks on phone: Not on file    Gets together: Not on file    Attends religious service: Not on file  Active member of club or organization: Not on file    Attends meetings of clubs or organizations: Not on file    Relationship status: Not on file  . Intimate partner violence:    Fear of current or ex partner: Not on file    Emotionally abused: Not on file    Physically abused: Not on file    Forced sexual activity: Not on file  Other Topics Concern  . Not on file  Social History Narrative   ** Merged History Encounter **        Family History:  Family History  Problem Relation Age of Onset  . Cancer Mother        Thymoma, metastatic  . Heart disease Father   . Stroke Father   . Stroke Maternal Grandfather   . Heart disease Paternal Grandfather   . Multiple sclerosis Sister   . Epilepsy Sister   . Diabetes Neg Hx     Medications:   Current Outpatient Medications on File Prior to Visit  Medication Sig Dispense Refill  . atorvastatin (LIPITOR) 80 MG tablet Take 1 tablet (80 mg total) by mouth daily at 6 PM. 90 tablet 3  . BD ULTRA-FINE PEN NEEDLES 29G X 12.7MM MISC 2 packages with 100 in each package 2 each 0  . busPIRone (BUSPAR) 15 MG tablet Take 1 tablet (15 mg total) by mouth 3 (three) times daily. 90 tablet 3  . carvedilol (COREG) 25 MG tablet Take 1 tablet (25 mg total) by mouth 2 (two) times daily. 180 tablet 1  . clopidogrel (PLAVIX) 75 MG tablet Take 1 tablet (75 mg total) by mouth daily. 90 tablet 3  . exenatide (BYETTA 5 MCG PEN) 5 MCG/0.02ML  SOPN injection Inject 0.02 mLs (5 mcg total) into the skin 2 (two) times daily with a meal. 1.2 mL 3  . ezetimibe (ZETIA) 10 MG tablet Take 1 tablet (10 mg total) by mouth daily. 90 tablet 3  . gabapentin (NEURONTIN) 300 MG capsule Take 1 capsule (300 mg total) by mouth 3 (three) times daily. 90 capsule 3  . insulin aspart (NOVOLOG) 100 UNIT/ML FlexPen Inject 10 Units into the skin 3 (three) times daily before meals. "PER A SLIDING SCALE OF AN ADDITIONAL 2-10 UNITS" 15 mL 3  . LANTUS SOLOSTAR 100 UNIT/ML Solostar Pen Inject 200 Units into the skin every morning. 25 pen 11  . ranolazine (RANEXA) 1000 MG SR tablet Take 1 tablet (1,000 mg total) by mouth 2 (two) times daily. 180 tablet 3  . sertraline (ZOLOFT) 50 MG tablet Take 1 tablet (50 mg total) by mouth daily. 90 tablet 1  . Tapentadol HCl (NUCYNTA) 100 MG TABS Take 1 tablet by mouth 4 (four) times daily.    Marland Kitchen tiZANidine (ZANAFLEX) 4 MG tablet Take 4 mg by mouth 3 (three) times daily.     No current facility-administered medications on file prior to visit.     Allergies:   Allergies  Allergen Reactions  . Lasix [Furosemide]     Dehydration leading to kidney failure   . Norvasc [Amlodipine Besylate]     Severe edema  . Prednisone Other (See Comments)    Ketoacidosis  . Vancomycin Other (See Comments)    Total organ shut down  . Doxycycline Hives  . Clindamycin/Lincomycin Hives and Itching  . Ibuprofen Other (See Comments)    WAS TOLD TO NOT TAKE THIS BECAUSE OF HER KIDNEYS AND LIVER  . Inderal [Propranolol] Cough  . Lisinopril Other (See  Comments)    WAS TOLD TO NOT TAKE THIS BECAUSE OF HER KIDNEYS AND LIVER  . Lovastatin Other (See Comments)    WAS TOLD TO NOT TAKE THIS BECAUSE OF HER KIDNEYS AND LIVER  . Norvasc [Amlodipine Besylate] Other (See Comments)    Severe edema  . Nsaids Other (See Comments)    WAS TOLD TO NOT TAKE THIS BECAUSE OF HER KIDNEYS AND LIVER  . Sulfa Antibiotics Nausea Only     Physical Exam  Vitals:    04/23/17 0853  BP: 104/64  Pulse: 86  Weight: 218 lb 12.8 oz (99.2 kg)  Height: 5' 6"  (1.676 m)   Body mass index is 35.32 kg/m. No exam data present  General: Obese middle-aged Caucasian female, seated, in no evident distress Head: head normocephalic and atraumatic.   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 Oriented to place and time. Recent and remote memory intact. Attention span, concentration and fund of knowledge appropriate. Mood and affect appropriate.  Cranial Nerves: Fundoscopic exam reveals sharp disc margins. Pupils equal, briskly reactive to light. Extraocular movements full without nystagmus. Visual fields full to confrontation. Hearing intact. Facial sensation intact. Face, tongue, palate moves normally and symmetrically.  Motor: 5/5 right upper extremity; 4/5 bilateral lower extremities; weak dorsiflexion in left foot; 3/5 left upper extremity Sensory.:  No sensation bilateral lower extremity ankle down.  Sensation increases in bilateral lower extremity above-knee.  Equal sensation noted in bilateral upper extremities Coordination: Rapid alternating movements normal in all extremities. Finger-to-nose and heel-to-shin performed accurately bilaterally.  Orbiting right hand over left. Gait and Station: Arises from chair with mild difficulty. Stance is hunched.  Gait demonstrates waddling gait with assistance from cane.  Tandem gait not performed. Reflexes: 1+ and symmetric. Toes downgoing.    NIHSS 1 (partial sensory loss)  Modified Rankin  4    Diagnostic Data (Labs, Imaging, Testing)  Ct Head Wo  Contrast 01/01/2017 IMPRESSION:  No CT evidence for acute intracranial abnormality.   CTA Head & Neck - 01/03/2017 IMPRESSION: CT HEAD: 1. No acute intracranial process. 2. Mild to moderate atherosclerosis, otherwise negative CT HEAD with and without contrast for age. CTA NECK: 1. No hemodynamically significant stenosis or acute vascular process. 2. **An incidental finding of potential clinical significance has been found. 3.5 cm ascending aorta. Recommend annual imaging followup by CTA or MRA. This recommendation follows 2010 ACCF/AHA/AATS/ACR/ASA/SCA/SCAI/SIR/STS/SVM Guidelines for the Diagnosis and Management of Patients with Thoracic Aortic Disease. Circulation.2010; 121: E527-P824** CTA HEAD: 1. No emergent large vessel occlusion. 2. 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 3. Mild atherosclerosis cerebral artery's.  Transthoracic Echocardiogram 09/21/2016 Impressions: - Normal LV systolic function; mild diastolic dysfunction; sclerotic aortic valve; mildly dilated ascending aorta.     ASSESSMENT: Breena Nader is a 62 y.o. year old female here with TIA versus migraine on 01/01/2017.  Uncontrolled diabetes and history of OSA, HTN, and HLD.  Denies new or worsening stroke/TIA symptoms.    PLAN: -Continue clopidogrel 75 mg daily  and Lipitor and Zetia for secondary stroke prevention -F/u with PCP regarding your HLD and HTN management -F/U with endocrinologist regarding diabetes management -Check A1c, random glucose and  lipid levels during this appointment -Order for knee x-ray [place] [facility] placed -Recommend patient see nutritionalist for education regarding diabetes management.  Provided patient with education regarding diabetic nutrition. -Maintain strict control of hypertension with blood pressure goal below 130/90, diabetes with hemoglobin A1c goal below 6.5% and cholesterol with LDL cholesterol (bad cholesterol) goal below 70 mg/dL.  I also advised the patient to eat a healthy diet with plenty of whole grains, cereals, fruits and vegetables, exercise regularly and maintain ideal body weight.  Greater than 50% time during this 25 minute consultation visit was spent on counseling and coordination of care about HLD, HTN and DM (risk factors), discussion about risk benefit of anticoagulation and answering questions.   Follow up in 4 months or call earlier if needed   Orders Placed This Encounter  Procedures  . DG Knee 1-2 Views Left  . Hemoglobin A1c  . Lipid Panel  . Glucose, Random     Venancio Poisson, AGNP-BC  [facility] [address] [place], [place] [address]  Phone [phone] Fax [phone]

## 2017-04-24 LAB — GLUCOSE, RANDOM: GLUCOSE: 165 mg/dL — AB (ref 65–99)

## 2017-04-24 LAB — LIPID PANEL
CHOLESTEROL TOTAL: 131 mg/dL (ref 100–199)
Chol/HDL Ratio: 4.7 ratio — ABNORMAL HIGH (ref 0.0–4.4)
HDL: 28 mg/dL — AB (ref >39)
LDL Calculated: 62 mg/dL (ref 0–99)
TRIGLYCERIDES: 207 mg/dL — AB (ref 0–149)
VLDL CHOLESTEROL CAL: 41 mg/dL — AB (ref 5–40)

## 2017-04-24 LAB — HEMOGLOBIN A1C
ESTIMATED AVERAGE GLUCOSE: 278 mg/dL
HEMOGLOBIN A1C: 11.3 % — AB (ref 4.8–5.6)

## 2017-04-26 ENCOUNTER — Telehealth: Payer: Self-pay | Admitting: Adult Health

## 2017-04-26 NOTE — Telephone Encounter (Signed)
Called [name] regarding her recent lab work and knee xray after her fall the morning of her appointment. Her knee was negative for fracture or effusion but did show severe left knee tricompartmental osteoarthritis. She did state her knee is starting to feel better.  Her A1c 11.3 (previous 12.9), LDL 62 (previously unable to calculate due to high TG), Triglycerides 207 (previously 603). Patient pleased with results and no further questions. Has follow up with PCP on Wednesday and Endocrinologist Thursday.  Appreciated phone call.

## 2017-04-28 ENCOUNTER — Ambulatory Visit (INDEPENDENT_AMBULATORY_CARE_PROVIDER_SITE_OTHER): Payer: Medicare HMO | Admitting: Family Medicine

## 2017-04-28 ENCOUNTER — Encounter: Payer: Self-pay | Admitting: Family Medicine

## 2017-04-28 ENCOUNTER — Other Ambulatory Visit: Payer: Self-pay

## 2017-04-28 VITALS — BP 136/80 | HR 90 | Temp 97.7°F | Resp 16 | Ht 66.0 in | Wt 231.0 lb

## 2017-04-28 DIAGNOSIS — E1165 Type 2 diabetes mellitus with hyperglycemia: Secondary | ICD-10-CM

## 2017-04-28 DIAGNOSIS — Z6837 Body mass index (BMI) 37.0-37.9, adult: Secondary | ICD-10-CM

## 2017-04-28 DIAGNOSIS — E639 Nutritional deficiency, unspecified: Secondary | ICD-10-CM

## 2017-04-28 DIAGNOSIS — M25562 Pain in left knee: Secondary | ICD-10-CM

## 2017-04-28 DIAGNOSIS — E162 Hypoglycemia, unspecified: Secondary | ICD-10-CM | POA: Diagnosis not present

## 2017-04-28 NOTE — Progress Notes (Signed)
Chief Complaint  Patient presents with  . fall, dm and hyperlipidemia    follow up    HPI   Left knee arthritis Pt reports that she is having improvement from her left knee pain Less swelling and less painful She is using her cane to ambulate She states that she has been getting physical therapy   Diabetes Patient reports that she gets 150-220s for her fasting glucose in the mornings She takes her insulin and eats breakfast Cereal with 2% milk and occasionally yogurt 1-2 hours later when she checks her sugars then the readings are 60s to 70s. She then drinks juice or eats candy to bring it up She has tuna fish sandwich for lunch For dinner she has chicken/shrimp/hamburger, veggies and rash Her only time she gets low blood glucose is after breakfast and after her morning insulin dose  Obesity She is exercising with PT/OT She is moving around more She is cutting back on sugary drinks and snacks She is limiting fatty foods and meats. Body mass index is 37.28 kg/m.  Wt Readings from Last 3 Encounters:  04/28/17 231 lb (104.8 kg)  04/23/17 218 lb 12.8 oz (99.2 kg)  04/13/17 233 lb (105.7 kg)     Past Medical History:  Diagnosis Date  . Allergy   . Arthritis   . Chronic kidney disease   . Chronic pain   . Coronary artery disease   . Diabetes mellitus without complication ([place])   . Dyspnea   . GERD (gastroesophageal reflux disease)   . Heart murmur   . Hypertension   . Liver disease   . NASH (nonalcoholic steatohepatitis) 09/16/2016  . Obstructive sleep apnea   . Osteomyelitis ([place])   . Peripheral vascular disease ([place])   . Sleep apnea   . Stroke ([place])   . Ulcers of both great toes ([place]) 11/07/2016   right great toe, started as finger nail size and grew in 6 days     Current Outpatient Medications  Medication Sig Dispense Refill  . atorvastatin (LIPITOR) 80 MG tablet Take 1 tablet (80 mg total) by mouth daily at 6 PM. 90 tablet 3  . BD ULTRA-FINE PEN  NEEDLES 29G X 12.7MM MISC 2 packages with 100 in each package 2 each 0  . busPIRone (BUSPAR) 15 MG tablet Take 1 tablet (15 mg total) by mouth 3 (three) times daily. 90 tablet 3  . carvedilol (COREG) 25 MG tablet Take 1 tablet (25 mg total) by mouth 2 (two) times daily. 180 tablet 1  . clopidogrel (PLAVIX) 75 MG tablet Take 1 tablet (75 mg total) by mouth daily. 90 tablet 3  . exenatide (BYETTA 5 MCG PEN) 5 MCG/0.02ML SOPN injection Inject 0.02 mLs (5 mcg total) into the skin 2 (two) times daily with a meal. 1.2 mL 3  . ezetimibe (ZETIA) 10 MG tablet Take 1 tablet (10 mg total) by mouth daily. 90 tablet 3  . gabapentin (NEURONTIN) 300 MG capsule Take 1 capsule (300 mg total) by mouth 3 (three) times daily. 90 capsule 3  . insulin aspart (NOVOLOG) 100 UNIT/ML FlexPen Inject 10 Units into the skin 3 (three) times daily before meals. "PER A SLIDING SCALE OF AN ADDITIONAL 2-10 UNITS" 15 mL 3  . LANTUS SOLOSTAR 100 UNIT/ML Solostar Pen Inject 200 Units into the skin every morning. 25 pen 11  . ranolazine (RANEXA) 1000 MG SR tablet Take 1 tablet (1,000 mg total) by mouth 2 (two) times daily. 180 tablet 3  . sertraline (  ZOLOFT) 50 MG tablet Take 1 tablet (50 mg total) by mouth daily. 90 tablet 1  . Tapentadol HCl (NUCYNTA) 100 MG TABS Take 1 tablet by mouth 4 (four) times daily.    Marland Kitchen tiZANidine (ZANAFLEX) 4 MG tablet Take 4 mg by mouth 3 (three) times daily.     No current facility-administered medications for this visit.     Allergies:  Allergies  Allergen Reactions  . Lasix [Furosemide]     Dehydration leading to kidney failure   . Norvasc [Amlodipine Besylate]     Severe edema  . Prednisone Other (See Comments)    Ketoacidosis  . Vancomycin Other (See Comments)    Total organ shut down  . Doxycycline Hives  . Clindamycin/Lincomycin Hives and Itching  . Ibuprofen Other (See Comments)    WAS TOLD TO NOT TAKE THIS BECAUSE OF HER KIDNEYS AND LIVER  . Inderal [Propranolol] Cough  . Lisinopril  Other (See Comments)    WAS TOLD TO NOT TAKE THIS BECAUSE OF HER KIDNEYS AND LIVER  . Lovastatin Other (See Comments)    WAS TOLD TO NOT TAKE THIS BECAUSE OF HER KIDNEYS AND LIVER  . Norvasc [Amlodipine Besylate] Other (See Comments)    Severe edema  . Nsaids Other (See Comments)    WAS TOLD TO NOT TAKE THIS BECAUSE OF HER KIDNEYS AND LIVER  . Sulfa Antibiotics Nausea Only    Past Surgical History:  Procedure Laterality Date  . ABDOMINAL HYSTERECTOMY    . BACK SURGERY    . CESAREAN SECTION    . CHOLECYSTECTOMY    . HERNIA REPAIR    . JOINT REPLACEMENT    . KNEE SURGERY    . SPINE SURGERY    . TOE AMPUTATION    . TOE AMPUTATION Bilateral    3 on right foot 2 left foot   . TONSILLECTOMY    . TUBAL LIGATION      Social History   Socioeconomic History  . Marital status: Divorced    Spouse name: Not on file  . Number of children: Not on file  . Years of education: Not on file  . Highest education level: Not on file  Occupational History  . Not on file  Social Needs  . Financial resource strain: Not on file  . Food insecurity:    Worry: Not on file    Inability: Not on file  . Transportation needs:    Medical: Not on file    Non-medical: Not on file  Tobacco Use  . Smoking status: Never Smoker  . Smokeless tobacco: Never Used  Substance and Sexual Activity  . Alcohol use: No  . Drug use: No  . Sexual activity: Never  Lifestyle  . Physical activity:    Days per week: Not on file    Minutes per session: Not on file  . Stress: Not on file  Relationships  . Social connections:    Talks on phone: Not on file    Gets together: Not on file    Attends religious service: Not on file    Active member of club or organization: Not on file    Attends meetings of clubs or organizations: Not on file    Relationship status: Not on file  Other Topics Concern  . Not on file  Social History Narrative   ** Merged History Encounter **        Family History  Problem  Relation Age of Onset  . Cancer Mother  Thymoma, metastatic  . Heart disease Father   . Stroke Father   . Stroke Maternal Grandfather   . Heart disease Paternal Grandfather   . Multiple sclerosis Sister   . Epilepsy Sister   . Diabetes Neg Hx      ROS Review of Systems See HPI Constitution: No fevers or chills No malaise No diaphoresis Skin: No rash or itching Eyes: no blurry vision, no double vision GU: no dysuria or hematuria Neuro: no dizziness or headaches all others reviewed and negative   Objective: Vitals:   04/28/17 0916  BP: 136/80  Pulse: 90  Resp: 16  Temp: 97.7 F (36.5 C)  TempSrc: Oral  SpO2: 95%  Weight: 231 lb (104.8 kg)  Height: 5' 6"  (1.676 m)    Physical Exam  Constitutional: She is oriented to person, place, and time. She appears well-developed and well-nourished.  HENT:  Head: Normocephalic and atraumatic.  Eyes: Conjunctivae and EOM are normal.  Neck: Normal range of motion. No thyromegaly present.  Cardiovascular: Normal rate, regular rhythm and normal heart sounds.  No murmur heard. Pulmonary/Chest: Effort normal and breath sounds normal. No stridor. No respiratory distress.  Abdominal: Soft. Bowel sounds are normal. She exhibits distension. She exhibits no mass. There is no tenderness. There is no rebound and no guarding. No hernia.  Obese abdomen  Musculoskeletal:  Left knee with bruise but no effusion, mild tenderness to palpation  Neurological: She is alert and oriented to person, place, and time.  Psychiatric: She has a normal mood and affect. Her behavior is normal. Judgment and thought content normal.     Assessment and Plan Myliyah was seen today for fall, dm and hyperlipidemia.  Diagnoses and all orders for this visit:  Poor nutrition- poor protein intake is improving but still poor protein at breakfast  Uncontrolled type 2 diabetes mellitus with hyperglycemia ([place])- improving medication compliance and trying to  work on low sugar diet  Hypoglycemia- increase protein at breakfast and continue to monitor  Follow up with Endocrinology   Acute pain of left knee- continue stretches and ambulation with cane  Class 2 severe obesity due to excess calories with serious comorbidity and body mass index (BMI) of 37.0 to 37.9 in adult [facility])- improving with dietary changes and med compliance     Zoe A Nolon Rod

## 2017-04-28 NOTE — Patient Instructions (Addendum)
IF you received an x-ray today, you will receive an invoice from [facility]. Please contact [facility] at [phone] with questions or concerns regarding your invoice.   IF you received labwork today, you will receive an invoice from [facility]. Please contact [facility] at [phone] with questions or concerns regarding your invoice.   Our billing staff will not be able to assist you with questions regarding bills from these companies.  You will be contacted with the lab results as soon as they are available. The fastest way to get your results is to activate your My Chart account. Instructions are located on the last page of this paperwork. If you have not heard from [place] regarding the results in 2 weeks, please contact this office.     Hypoglycemia Hypoglycemia occurs when the level of sugar (glucose) in the blood is too low. Glucose is a type of sugar that provides the body's main source of energy. Certain hormones (insulin and glucagon) control the level of glucose in the blood. Insulin lowers blood glucose, and glucagon increases blood glucose. Hypoglycemia can result from having too much insulin in the bloodstream, or from not eating enough food that contains glucose. Hypoglycemia can happen in people who do or do not have diabetes. It can develop quickly, and it can be a medical emergency. What are the causes? Hypoglycemia occurs most often in people who have diabetes. If you have diabetes, hypoglycemia may be caused by:  Diabetes medicine.  Not eating enough, or not eating often enough.  Increased physical activity.  Drinking alcohol, especially when you have not eaten recently.  If you do not have diabetes, hypoglycemia may be caused by:  A tumor in the pancreas. The pancreas is the organ that makes insulin.  Not eating enough, or not eating for long periods at a time (fasting).  Severe infection or illness that affects the liver, heart, or  kidneys.  Certain medicines.  You may also have reactive hypoglycemia. This condition causes hypoglycemia within 4 hours of eating a meal. This may occur after having stomach surgery. Sometimes, the cause of reactive hypoglycemia is not known. What increases the risk? Hypoglycemia is more likely to develop in:  People who have diabetes and take medicines to lower blood glucose.  People who abuse alcohol.  People who have a severe illness.  What are the signs or symptoms? Hypoglycemia may not cause any symptoms. If you have symptoms, they may include:  Hunger.  Anxiety.  Sweating and feeling clammy.  Confusion.  Dizziness or feeling light-headed.  Sleepiness.  Nausea.  Increased heart rate.  Headache.  Blurry vision.  Seizure.  Nightmares.  Tingling or numbness around the mouth, lips, or tongue.  A change in speech.  Decreased ability to concentrate.  A change in coordination.  Restless sleep.  Tremors or shakes.  Fainting.  Irritability.  How is this diagnosed? Hypoglycemia is diagnosed with a blood test to measure your blood glucose level. This blood test is done while you are having symptoms. Your health care provider may also do a physical exam and review your medical history. If you do not have diabetes, other tests may be done to find the cause of your hypoglycemia. How is this treated? This condition can often be treated by immediately eating or drinking something that contains glucose, such as:  3-4 sugar tablets (glucose pills).  Glucose gel, 15-gram tube.  Fruit juice, 4 oz (120 mL).  Regular soda (not diet soda), 4 oz (120  120 mL).  Low-fat milk, 4 oz (120 mL).  Several pieces of hard candy.  Sugar or honey, 1 Tbsp.  Treating Hypoglycemia If You Have Diabetes  If you are alert and able to swallow safely, follow the 15:15 rule:  Take 15 grams of a rapid-acting carbohydrate. Rapid-acting options include: ? 1 tube of glucose  gel. ? 3 glucose pills. ? 6-8 pieces of hard candy. ? 4 oz (120 mL) of fruit juice. ? 4 oz (120 ml) of regular (not diet) soda.  Check your blood glucose 15 minutes after you take the carbohydrate.  If the repeat blood glucose level is still at or below 70 mg/dL (3.9 mmol/L), take 15 grams of a carbohydrate again.  If your blood glucose level does not increase above 70 mg/dL (3.9 mmol/L) after 3 tries, seek emergency medical care.  After your blood glucose level returns to normal, eat a meal or a snack within 1 hour.  Treating Severe Hypoglycemia Severe hypoglycemia is when your blood glucose level is at or below 54 mg/dL (3 mmol/L). Severe hypoglycemia is an emergency. Do not wait to see if the symptoms will go away. Get medical help right away. Call your local emergency services (911 in the [place] Do not drive yourself to the hospital. If you have severe hypoglycemia and you cannot eat or drink, you may need an injection of glucagon. A family member or close friend should learn how to check your blood glucose and how to give you a glucagon injection. Ask your health care provider if you need to have an emergency glucagon injection kit available. Severe hypoglycemia may need to be treated in a hospital. The treatment may include getting glucose through an IV tube. You may also need treatment for the cause of your hypoglycemia. Follow these instructions at home: General instructions  Avoid any diets that cause you to not eat enough food. Talk with your health care provider before you start any new diet.  Take over-the-counter and prescription medicines only as told by your health care provider.  Limit alcohol intake to no more than 1 drink per day for nonpregnant women and 2 drinks per day for men. One drink equals 12 oz of beer, 5 oz of wine, or 1 oz of hard liquor.  Keep all follow-up visits as told by your health care provider. This is important. If You Have Diabetes:   Make sure  you know the symptoms of hypoglycemia.  Always have a rapid-acting carbohydrate snack with you to treat low blood sugar.  Follow your diabetes management plan, as told by your health care provider. Make sure you: ? Take your medicines as directed. ? Follow your exercise plan. ? Follow your meal plan. Eat on time, and do not skip meals. ? Check your blood glucose as often as directed. Make sure to check your blood glucose before and after exercise. If you exercise longer or in a different way than usual, check your blood glucose more often. ? Follow your sick day plan whenever you cannot eat or drink normally. Make this plan in advance with your health care provider.  Share your diabetes management plan with people in your workplace, school, and household.  Check your urine for ketones when you are ill and as told by your health care provider.  Carry a medical alert card or wear medical alert jewelry. If You Have Reactive Hypoglycemia or Low Blood Sugar From Other Causes:  Monitor your blood glucose as told by your health care   Follow instructions from your health care provider about eating or drinking restrictions. Contact a health care provider if:  You have problems keeping your blood glucose in your target range.  You have frequent episodes of hypoglycemia. Get help right away if:  You continue to have hypoglycemia symptoms after eating or drinking something containing glucose.  Your blood glucose is at or below 54 mg/dL (3 mmol/L).  You have a seizure.  You faint. These symptoms may represent a serious problem that is an emergency. Do not wait to see if the symptoms will go away. Get medical help right away. Call your local emergency services (911 in the [place] Do not drive yourself to the hospital. This information is not intended to replace advice given to you by your health care provider. Make sure you discuss any questions you have with your health care  provider. Document Released: 01/19/2005 Document Revised: 07/03/2015 Document Reviewed: 02/22/2015 Elsevier Interactive Patient Education  [employer].

## 2017-04-29 ENCOUNTER — Encounter: Payer: Self-pay | Admitting: Endocrinology

## 2017-04-29 ENCOUNTER — Ambulatory Visit (INDEPENDENT_AMBULATORY_CARE_PROVIDER_SITE_OTHER): Payer: Medicare HMO | Admitting: Orthopedic Surgery

## 2017-04-29 ENCOUNTER — Ambulatory Visit (INDEPENDENT_AMBULATORY_CARE_PROVIDER_SITE_OTHER): Payer: Medicare HMO | Admitting: Endocrinology

## 2017-04-29 VITALS — BP 120/62 | HR 79 | Wt 230.8 lb

## 2017-04-29 DIAGNOSIS — E1122 Type 2 diabetes mellitus with diabetic chronic kidney disease: Secondary | ICD-10-CM | POA: Diagnosis not present

## 2017-04-29 DIAGNOSIS — E1165 Type 2 diabetes mellitus with hyperglycemia: Secondary | ICD-10-CM

## 2017-04-29 DIAGNOSIS — N183 Chronic kidney disease, stage 3 unspecified: Secondary | ICD-10-CM

## 2017-04-29 DIAGNOSIS — Z794 Long term (current) use of insulin: Secondary | ICD-10-CM

## 2017-04-29 MED ORDER — INSULIN ISOPHANE HUMAN 100 UNIT/ML KWIKPEN: 200.0000 [IU] | PEN_INJECTOR | SUBCUTANEOUS | 11 refills | Status: DC

## 2017-04-29 MED ORDER — INSULIN ASPART 100 UNIT/ML FLEXPEN: 10.0000 [IU] | PEN_INJECTOR | Freq: Three times a day (TID) | SUBCUTANEOUS | 3 refills | Status: DC

## 2017-04-29 NOTE — Progress Notes (Signed)
Subjective:    Patient ID: [name], female    DOB: January 14, 1956, 62 y.o.   MRN: [id number]  HPI Pt returns for f/u of diabetes mellitus: DM type: Insulin-requiring type 2 Dx'ed: 6314 Complications: polyneuropathy, renal failure, NPDR, CAD, PAD, TIA, and toe amputations Therapy: insulin since 2005, and byetta. GDM: never DKA: never Severe hypoglycemia: never Pancreatitis: never Pancreatic imaging: normal on 2018 CT Other: her hands and vision are not good enough for syringe and vial; she takes qd insulin, due to poor results with multiple daily injections; QD insulin is NPH, due to renal failure.   Interval history: no cbg record, but states cbg's varies from 69-400.  It is lowest after she takes novolog, but does not eat.  cbg is in general higher as the day goes on.  Past Medical History:  Diagnosis Date  . Allergy   . Arthritis   . Chronic kidney disease   . Chronic pain   . Coronary artery disease   . Diabetes mellitus without complication ([place])   . Dyspnea   . GERD (gastroesophageal reflux disease)   . Heart murmur   . Hypertension   . Liver disease   . NASH (nonalcoholic steatohepatitis) 09/16/2016  . Obstructive sleep apnea   . Osteomyelitis ([place])   . Peripheral vascular disease ([place])   . Sleep apnea   . Stroke ([place])   . Ulcers of both great toes ([place]) 11/07/2016   right great toe, started as finger nail size and grew in 6 days     Past Surgical History:  Procedure Laterality Date  . ABDOMINAL HYSTERECTOMY    . BACK SURGERY    . CESAREAN SECTION    . CHOLECYSTECTOMY    . HERNIA REPAIR    . JOINT REPLACEMENT    . KNEE SURGERY    . SPINE SURGERY    . TOE AMPUTATION    . TOE AMPUTATION Bilateral    3 on right foot 2 left foot   . TONSILLECTOMY    . TUBAL LIGATION      Social History   Socioeconomic History  . Marital status: Divorced    Spouse name: Not on file  . Number of children: Not on file  . Years of education: Not on file  . Highest  education level: Not on file  Occupational History  . Not on file  Social Needs  . Financial resource strain: Not on file  . Food insecurity:    Worry: Not on file    Inability: Not on file  . Transportation needs:    Medical: Not on file    Non-medical: Not on file  Tobacco Use  . Smoking status: Never Smoker  . Smokeless tobacco: Never Used  Substance and Sexual Activity  . Alcohol use: No  . Drug use: No  . Sexual activity: Never  Lifestyle  . Physical activity:    Days per week: Not on file    Minutes per session: Not on file  . Stress: Not on file  Relationships  . Social connections:    Talks on phone: Not on file    Gets together: Not on file    Attends religious service: Not on file    Active member of club or organization: Not on file    Attends meetings of clubs or organizations: Not on file    Relationship status: Not on file  . Intimate partner violence:    Fear of current or ex partner: Not on file  Emotionally abused: Not on file    Physically abused: Not on file    Forced sexual activity: Not on file  Other Topics Concern  . Not on file  Social History Narrative   ** Merged History Encounter **        Current Outpatient Medications on File Prior to Visit  Medication Sig Dispense Refill  . atorvastatin (LIPITOR) 80 MG tablet Take 1 tablet (80 mg total) by mouth daily at 6 PM. 90 tablet 3  . BD ULTRA-FINE PEN NEEDLES 29G X 12.7MM MISC 2 packages with 100 in each package 2 each 0  . busPIRone (BUSPAR) 15 MG tablet Take 1 tablet (15 mg total) by mouth 3 (three) times daily. 90 tablet 3  . carvedilol (COREG) 25 MG tablet Take 1 tablet (25 mg total) by mouth 2 (two) times daily. 180 tablet 1  . clopidogrel (PLAVIX) 75 MG tablet Take 1 tablet (75 mg total) by mouth daily. 90 tablet 3  . ezetimibe (ZETIA) 10 MG tablet Take 1 tablet (10 mg total) by mouth daily. 90 tablet 3  . gabapentin (NEURONTIN) 300 MG capsule Take 1 capsule (300 mg total) by mouth 3  (three) times daily. 90 capsule 3  . ranolazine (RANEXA) 1000 MG SR tablet Take 1 tablet (1,000 mg total) by mouth 2 (two) times daily. 180 tablet 3  . sertraline (ZOLOFT) 50 MG tablet Take 1 tablet (50 mg total) by mouth daily. 90 tablet 1  . Tapentadol HCl (NUCYNTA) 100 MG TABS Take 1 tablet by mouth 4 (four) times daily.    Marland Kitchen tiZANidine (ZANAFLEX) 4 MG tablet Take 4 mg by mouth 3 (three) times daily.     No current facility-administered medications on file prior to visit.     Allergies  Allergen Reactions  . Lasix [Furosemide]     Dehydration leading to kidney failure   . Norvasc [Amlodipine Besylate]     Severe edema  . Prednisone Other (See Comments)    Ketoacidosis  . Vancomycin Other (See Comments)    Total organ shut down  . Doxycycline Hives  . Clindamycin/Lincomycin Hives and Itching  . Ibuprofen Other (See Comments)    WAS TOLD TO NOT TAKE THIS BECAUSE OF HER KIDNEYS AND LIVER  . Inderal [Propranolol] Cough  . Lisinopril Other (See Comments)    WAS TOLD TO NOT TAKE THIS BECAUSE OF HER KIDNEYS AND LIVER  . Lovastatin Other (See Comments)    WAS TOLD TO NOT TAKE THIS BECAUSE OF HER KIDNEYS AND LIVER  . Norvasc [Amlodipine Besylate] Other (See Comments)    Severe edema  . Nsaids Other (See Comments)    WAS TOLD TO NOT TAKE THIS BECAUSE OF HER KIDNEYS AND LIVER  . Sulfa Antibiotics Nausea Only    Family History  Problem Relation Age of Onset  . Cancer Mother        Thymoma, metastatic  . Heart disease Father   . Stroke Father   . Stroke Maternal Grandfather   . Heart disease Paternal Grandfather   . Multiple sclerosis Sister   . Epilepsy Sister   . Diabetes Neg Hx     BP 120/62 (BP Location: Left Arm, Patient Position: Sitting, Cuff Size: Normal)   Pulse 79   Wt 230 lb 12.8 oz (104.7 kg)   SpO2 96%   BMI 37.25 kg/m   Review of Systems Denies LOC    Objective:   Physical Exam VITAL SIGNS:  See vs page GENERAL: no distress Pulses:  dorsalis pedis  intact bilat.   CV: trace bilat leg edema Skin:  no ulcer on the feet.  normal color and temp on the feet. Neuro: sensation is intact to touch on the feet MSK: absent are the left 2nd and 1-3 right toes.  The left great and 3rd toes are partially amputated.   EXT: right foot is bandaged (sees wound care).   PULSES: dorsalis pedis intact bilat.     Lab Results  Component Value Date   CREATININE 1.56 (H) 04/06/2017   BUN 25 (H) 04/06/2017   NA 138 04/06/2017   K 3.7 04/06/2017   CL 107 04/06/2017   CO2 22 04/06/2017        Assessment & Plan:  Insulin-requiring type 2 DM, with PAD: worse Renal failure: he may need to change NPH to 70/30  Patient Instructions  check your blood sugar 3 times a day.  vary the time of day when you check, between before the 3 meals, and at bedtime.  also check if you have symptoms of your blood sugar being too high or too low.  please keep a record of the readings and bring it to your next appointment here (or you can bring the meter itself).  You can write it on any piece of paper.  please call us sooner if your blood sugar goes below 70, or if you have a lot of readings over 200.  We will need to take this complex situation in stages.   for now, please: change lantus to NPH, 200 units daily (all in the morning), and:  Please continue the same novolog only if the blood sugar is over 300.   Please come back for a follow-up appointment in 2 weeks.

## 2017-04-29 NOTE — Patient Instructions (Addendum)
check your blood sugar 3 times a day.  vary the time of day when you check, between before the 3 meals, and at bedtime.  also check if you have symptoms of your blood sugar being too high or too low.  please keep a record of the readings and bring it to your next appointment here (or you can bring the meter itself).  You can write it on any piece of paper.  please call us sooner if your blood sugar goes below 70, or if you have a lot of readings over 200.  We will need to take this complex situation in stages.   for now, please: change lantus to NPH, 200 units daily (all in the morning), and:  Please continue the same novolog only if the blood sugar is over 300.   Please come back for a follow-up appointment in 2 weeks.

## 2017-04-30 ENCOUNTER — Other Ambulatory Visit: Payer: Self-pay | Admitting: Family Medicine

## 2017-04-30 ENCOUNTER — Telehealth: Payer: Self-pay | Admitting: Family Medicine

## 2017-04-30 ENCOUNTER — Other Ambulatory Visit: Payer: Self-pay

## 2017-04-30 ENCOUNTER — Telehealth: Payer: Self-pay | Admitting: Endocrinology

## 2017-04-30 MED ORDER — "INSULIN SYRINGE 29G X 1/2"" 1 ML MISC": 11 refills | Status: AC

## 2017-04-30 MED ORDER — INSULIN NPH (HUMAN) (ISOPHANE) 100 UNIT/ML ~~LOC~~ SUSP: SUBCUTANEOUS | 11 refills | Status: DC

## 2017-04-30 NOTE — Telephone Encounter (Signed)
Insulin NPH, Human,, Isophane, (HUMULIN N KWIKPEN) 100 UNIT/ML Kiwkpen  Patients insurance will not cover this. They would  Like [place] to send in the [facility] N instead for patient.     [facility] [phone] - [place], [place]

## 2017-04-30 NOTE — Telephone Encounter (Signed)
Copied from [place] [id number]. Topic: Quick Communication - See Telephone Encounter >> Apr 30, 2017  1:09 PM Clack, Laban Emperor wrote: CRM for notification. See Telephone encounter for: 04/30/17.  Elmyra Ricks with [facility] calling to request verbal orders: skilled nursing for assisting for diabetic wound care and medication teaching.  [phone] (can leave a v-mail)

## 2017-04-30 NOTE — Telephone Encounter (Signed)
I called patient & have sent in for Novolin vials with syringes.

## 2017-04-30 NOTE — Telephone Encounter (Addendum)
[place] refill Last OV: 04/20/16 Last Refill:11/23/16 1.2 ml 3 RF Pharmacy:[facility] [address] PCP: Delia Chimes MD HGB A1C: 11.3

## 2017-05-01 NOTE — Telephone Encounter (Signed)
LMOVM with VO for SN for asst with DM care and Med teaching as needed

## 2017-05-03 ENCOUNTER — Telehealth: Payer: Self-pay | Admitting: Endocrinology

## 2017-05-03 ENCOUNTER — Other Ambulatory Visit: Payer: Self-pay

## 2017-05-03 NOTE — Telephone Encounter (Signed)
I spoke with patient & her daughter is picking up syringe at [facility] on [address] where they were sent on Friday.

## 2017-05-03 NOTE — Telephone Encounter (Addendum)
Need refill for INSULIN SYRINGE 1CC/29G (B-D INS SYR ULTRAFINE 1CC/29G) 29G X 1/2" 1 ML MISC [[id number]]   Send to [facility] [address], [place], [place] [address] Phone: [phone]

## 2017-05-05 DIAGNOSIS — E1165 Type 2 diabetes mellitus with hyperglycemia: Secondary | ICD-10-CM

## 2017-05-05 DIAGNOSIS — Z09 Encounter for follow-up examination after completed treatment for conditions other than malignant neoplasm: Secondary | ICD-10-CM

## 2017-05-05 DIAGNOSIS — R160 Hepatomegaly, not elsewhere classified: Secondary | ICD-10-CM

## 2017-05-05 DIAGNOSIS — G4733 Obstructive sleep apnea (adult) (pediatric): Secondary | ICD-10-CM | POA: Diagnosis not present

## 2017-05-05 DIAGNOSIS — E639 Nutritional deficiency, unspecified: Secondary | ICD-10-CM

## 2017-05-05 DIAGNOSIS — J181 Lobar pneumonia, unspecified organism: Secondary | ICD-10-CM

## 2017-05-06 ENCOUNTER — Other Ambulatory Visit (INDEPENDENT_AMBULATORY_CARE_PROVIDER_SITE_OTHER): Payer: Self-pay | Admitting: Orthopedic Surgery

## 2017-05-06 ENCOUNTER — Ambulatory Visit (INDEPENDENT_AMBULATORY_CARE_PROVIDER_SITE_OTHER): Payer: Medicare HMO | Admitting: Orthopedic Surgery

## 2017-05-06 ENCOUNTER — Encounter (INDEPENDENT_AMBULATORY_CARE_PROVIDER_SITE_OTHER): Payer: Self-pay | Admitting: Orthopedic Surgery

## 2017-05-06 VITALS — Ht 66.0 in | Wt 230.0 lb

## 2017-05-06 DIAGNOSIS — M869 Osteomyelitis, unspecified: Secondary | ICD-10-CM | POA: Diagnosis not present

## 2017-05-06 NOTE — Progress Notes (Signed)
Office Visit Note   Patient: [name]           Date of Birth: [birth year]           MRN: [id number] Visit Date: 05/06/2017              Requested by: Forrest Moron, MD [place], [place] [address] PCP: Forrest Moron, MD  Chief Complaint  Patient presents with  . Left Foot - Wound Check  . Right Foot - Wound Check      HPI: Patient is a 62 year old woman with poorly controlled diabetes her most recent hemoglobin A1c is 11.3.  Patient is currently drinking sweet tea in the office.  Her daughter states that when she finishes one sweet tea she has another T1-weighting.  Patient states she has a hole on the great toe bilaterally.  Patient states she just purchased 7 cases of soda.  Assessment & Plan: Visit Diagnoses:  1. Osteomyelitis of great toe of right foot ([place])   2. Osteomyelitis of great toe of left foot ([place])     Plan: Patient has osteomyelitis of the great toe bilaterally.  Discussed that her only option would be to proceed with surgical intervention for amputation of the great toe bilaterally.  Risks and benefits of surgery were discussed including risk of the wound breakdown need for higher level amputation.  Discussed the critical nature of her controlling her diabetes she is not to drink any sugary beverage or artificially sweetened beverage.  Discussed the importance of decreasing carbohydrates.  Patient states she understands wished to proceed at this time.  Follow-Up Instructions: Return in about 1 week (around 05/13/2017).   Ortho Exam  Patient is alert, oriented, no adenopathy, well-dressed, normal affect, normal respiratory effort. Patient has an antalgic gait she has a good dorsalis pedis pulse bilaterally.  There is cellulitis of the left great toe with an ulcer.  After informed consent a 10 blade knife was used to debride the skin and soft tissue from the ulcer.  Silver nitrate was used for hemostasis.  The ulcer probes all the way down to bone  consistent with osteomyelitis with sausage digit swelling of the toe.  Examination of the right foot she has a large ulcer over the MTP joint of the great toe.  After informed consent a 10 blade knife was used to debride the skin and soft tissue back to bleeding viable granulation tissue.  This was touched with silver nitrate.  The ulcer is 3 cm in diameter and 3 mm deep.  The left great toe ulcer is 15 mm in diameter and 5 mm deep.  Imaging: No results found. No images are attached to the encounter.  Labs: Lab Results  Component Value Date   HGBA1C 11.3 (H) 04/23/2017   HGBA1C 12.9 03/26/2017   HGBA1C 12.4 (H) 01/02/2017   ESRSEDRATE 39 (H) 12/05/2016   CRP 3.7 (H) 12/05/2016   REPTSTATUS 04/06/2017 FINAL 04/04/2017   CULT  04/04/2017    NO GROWTH Performed at [facility], [place] [address] [place],  [address]    LABORGA ESCHERICHIA COLI (A) 10/28/2016    @LABSALLVALUES (HGBA1)@  Body mass index is 37.12 kg/m.  Orders:  No orders of the defined types were placed in this encounter.  No orders of the defined types were placed in this encounter.    Procedures: No procedures performed  Clinical Data: No additional findings.  ROS:  All other systems negative, except as noted in  the HPI. Review of Systems  Objective: Vital Signs: Ht 5' 6"  (1.676 m)   Wt 230 lb (104.3 kg)   BMI 37.12 kg/m   Specialty Comments:  No specialty comments available.  PMFS History: Patient Active Problem List   Diagnosis Date Noted  . Osteomyelitis of great toe of right foot ([place]) 05/06/2017  . Osteomyelitis of great toe of left foot ([place]) 05/06/2017  . Community acquired pneumonia 04/04/2017  . Angina pectoris ([place]) 03/08/2017  . Aortic atherosclerosis ([place]) 01/13/2017  . At high risk for falls 01/13/2017  . Stroke-like symptoms   . Complicated migraine   . Mixed hyperlipidemia   . Generalized weakness   . TIA (transient ischemic attack) 01/02/2017  . Ulcer of toe of  right foot, limited to breakdown of skin ([place]) 12/18/2016  . Non-pressure chronic ulcer of other part of right foot limited to breakdown of skin ([place]) 12/07/2016  . Diabetic foot ulcer ([place]) 12/04/2016  . Hypovolemia 12/04/2016  . Chronic pain syndrome 12/04/2016  . Diabetic peripheral neuropathy ([place]) 12/04/2016  . Hypotension   . Liver mass 09/20/2016  . Kidney disease, chronic, stage III (GFR 30-59 ml/min) (HCC) 12/17/2015  . Diabetes ([place]) 07/18/2015  . Essential hypertension 06/06/2015  . Chronic back pain 07/27/2012  . OSA on CPAP 03/21/2012   Past Medical History:  Diagnosis Date  . Allergy   . Arthritis   . Chronic kidney disease   . Chronic pain   . Coronary artery disease   . Diabetes mellitus without complication ([place])   . Dyspnea   . GERD (gastroesophageal reflux disease)   . Heart murmur   . Hypertension   . Liver disease   . NASH (nonalcoholic steatohepatitis) 09/16/2016  . Obstructive sleep apnea   . Osteomyelitis ([place])   . Peripheral vascular disease ([place])   . Sleep apnea   . Stroke ([place])   . Ulcers of both great toes ([place]) 11/07/2016   right great toe, started as finger nail size and grew in 6 days     Family History  Problem Relation Age of Onset  . Cancer Mother        Thymoma, metastatic  . Heart disease Father   . Stroke Father   . Stroke Maternal Grandfather   . Heart disease Paternal Grandfather   . Multiple sclerosis Sister   . Epilepsy Sister   . Diabetes Neg Hx     Past Surgical History:  Procedure Laterality Date  . ABDOMINAL HYSTERECTOMY    . BACK SURGERY    . CESAREAN SECTION    . CHOLECYSTECTOMY    . HERNIA REPAIR    . JOINT REPLACEMENT    . KNEE SURGERY    . SPINE SURGERY    . TOE AMPUTATION    . TOE AMPUTATION Bilateral    3 on right foot 2 left foot   . TONSILLECTOMY    . TUBAL LIGATION     Social History   Occupational History  . Not on file  Tobacco Use  . Smoking status: Never Smoker  . Smokeless tobacco: Never  Used  Substance and Sexual Activity  . Alcohol use: No  . Drug use: No  . Sexual activity: Never

## 2017-05-10 ENCOUNTER — Ambulatory Visit: Payer: Self-pay | Admitting: *Deleted

## 2017-05-10 ENCOUNTER — Ambulatory Visit: Payer: Medicare HMO | Admitting: Diagnostic Neuroimaging

## 2017-05-10 NOTE — Telephone Encounter (Signed)
   Answer Assessment - Initial Assessment Questions 1. SYMPTOM: "What is the main symptom you are concerned about?" (e.g., weakness, numbness)     Pt. was observed by daughter with unsteadiness and difficulty walking compared to her normal 2. ONSET: "When did this start?" (minutes, hours, days; while sleeping)     Today @ 12:00 PM  3. LAST NORMAL: "When was the last time you were normal (no symptoms)?"     *No Answer* 4. PATTERN "Does this come and go, or has it been constant since it started?"  "Is it present now?"     *No Answer* 5. CARDIAC SYMPTOMS: "Have you had any of the following symptoms: chest pain, difficulty breathing, palpitations?"     *No Answer* 6. NEUROLOGIC SYMPTOMS: "Have you had any of the following symptoms: headache, dizziness, vision loss, double vision, changes in speech, unsteady on your feet?"     *No Answer* 7. OTHER SYMPTOMS: "Do you have any other symptoms?"     *No Answer* 8. PREGNANCY: "Is there any chance you are pregnant?" "When was your last menstrual period?"     *No Answer*  Protocols used: NEUROLOGIC DEFICIT-A-AH

## 2017-05-10 NOTE — Telephone Encounter (Signed)
Daughter called to report pt. having change in alertness this afternoon.  Described episodes of having a blank stare "like she is out in left field."  Noted "garbled speech" at times.  Reported she observed her having difficulty walking short distance, from bathroom to living room x 1 today.   Stated this is "a big change" from yesterday. FBS this AM was 311 at 8:30 AM.  Ate bkfst at 11:30-12:00 PM; blood sugar down to 82, just prior to eating bkfst.  Blood sugar up to 180 at 3:30 PM.  BP 119/63, P. 71 during triage call.  Reported Pain management MD changed her medication on 4/5 to MS Contin 30 mg q 8 hr, and MS 15 mg IR q 8hr. Prn, for breakthrough pain.  Reported skin very moist during call.  Pt. somewhat argumentative with daughter, while she was giving information to Triage nurse.  Denied fever/ chills.  Stated pt. has hx of CVA.  Scheduled for bilateral great toe amputation for Osteomyelitis on Wed. 4/10.  Per protocol, advised to call EMS.  Daughter verb. Understanding; agreed with plan.         Reason for Disposition . [1] Loss of speech or garbled speech AND [2] sudden onset AND [3] present now  Answer Assessment - Initial Assessment Questions 1. SYMPTOM: "What is the main symptom you are concerned about?" (e.g., weakness, numbness)     Pt. was observed by daughter with unsteadiness and difficulty walking compared to her normal 2. ONSET: "When did this start?" (minutes, hours, days; while sleeping)     Today @ 12:00 PM  3. LAST NORMAL: "When was the last time you were normal (no symptoms)?"     Prior to noon today 4. PATTERN "Does this come and go, or has it been constant since it started?"  "Is it present now?"     Intermittently has episodes of being "out in left field"; verbal response is delayed when aroused 5. CARDIAC SYMPTOMS: "Have you had any of the following symptoms: chest pain, difficulty breathing, palpitations?"     No complaints of above 6. NEUROLOGIC SYMPTOMS: "Have you had any  of the following symptoms: headache, dizziness, vision loss, double vision, changes in speech, unsteady on your feet?"     Denies dizziness, vision loss, no facial droop, moving extremities freely;  daughter has reported garbled speech, skin is moist.   7. OTHER SYMPTOMS: "Do you have any other symptoms?"     Has had 4-5 episodes of delayed response to verbal stimuli; described "going in and out" 8. PREGNANCY: "Is there any chance you are pregnant?" "When was your last menstrual period?"     no  Protocols used: NEUROLOGIC DEFICIT-A-AH

## 2017-05-11 ENCOUNTER — Inpatient Hospital Stay (HOSPITAL_COMMUNITY)
Admission: EM | Admit: 2017-05-11 | Discharge: 2017-05-15 | DRG: 616 | Disposition: A | Discharge status: Skilled Nursing Facility | Payer: Medicare HMO | Source: Other Acute Inpatient Hospital | Attending: Internal Medicine | Admitting: Internal Medicine

## 2017-05-11 DIAGNOSIS — M1990 Unspecified osteoarthritis, unspecified site: Secondary | ICD-10-CM | POA: Diagnosis present

## 2017-05-11 DIAGNOSIS — I129 Hypertensive chronic kidney disease with stage 1 through stage 4 chronic kidney disease, or unspecified chronic kidney disease: Secondary | ICD-10-CM | POA: Diagnosis present

## 2017-05-11 DIAGNOSIS — Z794 Long term (current) use of insulin: Secondary | ICD-10-CM | POA: Diagnosis not present

## 2017-05-11 DIAGNOSIS — J9811 Atelectasis: Secondary | ICD-10-CM | POA: Diagnosis present

## 2017-05-11 DIAGNOSIS — I10 Essential (primary) hypertension: Secondary | ICD-10-CM | POA: Diagnosis present

## 2017-05-11 DIAGNOSIS — B9620 Unspecified Escherichia coli [E. coli] as the cause of diseases classified elsewhere: Secondary | ICD-10-CM | POA: Diagnosis present

## 2017-05-11 DIAGNOSIS — F419 Anxiety disorder, unspecified: Secondary | ICD-10-CM | POA: Diagnosis present

## 2017-05-11 DIAGNOSIS — Z9660 Presence of unspecified orthopedic joint implant: Secondary | ICD-10-CM | POA: Diagnosis present

## 2017-05-11 DIAGNOSIS — E1142 Type 2 diabetes mellitus with diabetic polyneuropathy: Secondary | ICD-10-CM | POA: Diagnosis present

## 2017-05-11 DIAGNOSIS — N390 Urinary tract infection, site not specified: Secondary | ICD-10-CM | POA: Diagnosis present

## 2017-05-11 DIAGNOSIS — L03031 Cellulitis of right toe: Secondary | ICD-10-CM | POA: Diagnosis present

## 2017-05-11 DIAGNOSIS — E1122 Type 2 diabetes mellitus with diabetic chronic kidney disease: Secondary | ICD-10-CM | POA: Diagnosis present

## 2017-05-11 DIAGNOSIS — I739 Peripheral vascular disease, unspecified: Secondary | ICD-10-CM | POA: Diagnosis present

## 2017-05-11 DIAGNOSIS — I69354 Hemiplegia and hemiparesis following cerebral infarction affecting left non-dominant side: Secondary | ICD-10-CM

## 2017-05-11 DIAGNOSIS — G9340 Encephalopathy, unspecified: Secondary | ICD-10-CM | POA: Diagnosis not present

## 2017-05-11 DIAGNOSIS — L97518 Non-pressure chronic ulcer of other part of right foot with other specified severity: Secondary | ICD-10-CM | POA: Diagnosis present

## 2017-05-11 DIAGNOSIS — Z886 Allergy status to analgesic agent status: Secondary | ICD-10-CM

## 2017-05-11 DIAGNOSIS — G9341 Metabolic encephalopathy: Secondary | ICD-10-CM | POA: Diagnosis present

## 2017-05-11 DIAGNOSIS — G629 Polyneuropathy, unspecified: Secondary | ICD-10-CM | POA: Diagnosis present

## 2017-05-11 DIAGNOSIS — N183 Chronic kidney disease, stage 3 unspecified: Secondary | ICD-10-CM

## 2017-05-11 DIAGNOSIS — E11621 Type 2 diabetes mellitus with foot ulcer: Secondary | ICD-10-CM | POA: Diagnosis present

## 2017-05-11 DIAGNOSIS — E1151 Type 2 diabetes mellitus with diabetic peripheral angiopathy without gangrene: Secondary | ICD-10-CM | POA: Diagnosis present

## 2017-05-11 DIAGNOSIS — Z79899 Other long term (current) drug therapy: Secondary | ICD-10-CM

## 2017-05-11 DIAGNOSIS — Z881 Allergy status to other antibiotic agents status: Secondary | ICD-10-CM

## 2017-05-11 DIAGNOSIS — Z8619 Personal history of other infectious and parasitic diseases: Secondary | ICD-10-CM

## 2017-05-11 DIAGNOSIS — G894 Chronic pain syndrome: Secondary | ICD-10-CM | POA: Diagnosis present

## 2017-05-11 DIAGNOSIS — Z882 Allergy status to sulfonamides status: Secondary | ICD-10-CM

## 2017-05-11 DIAGNOSIS — E119 Type 2 diabetes mellitus without complications: Secondary | ICD-10-CM

## 2017-05-11 DIAGNOSIS — E1165 Type 2 diabetes mellitus with hyperglycemia: Secondary | ICD-10-CM | POA: Diagnosis present

## 2017-05-11 DIAGNOSIS — Z89422 Acquired absence of other left toe(s): Secondary | ICD-10-CM

## 2017-05-11 DIAGNOSIS — L97528 Non-pressure chronic ulcer of other part of left foot with other specified severity: Secondary | ICD-10-CM | POA: Diagnosis present

## 2017-05-11 DIAGNOSIS — R2689 Other abnormalities of gait and mobility: Secondary | ICD-10-CM | POA: Diagnosis present

## 2017-05-11 DIAGNOSIS — Z7902 Long term (current) use of antithrombotics/antiplatelets: Secondary | ICD-10-CM

## 2017-05-11 DIAGNOSIS — Z89421 Acquired absence of other right toe(s): Secondary | ICD-10-CM

## 2017-05-11 DIAGNOSIS — E1169 Type 2 diabetes mellitus with other specified complication: Secondary | ICD-10-CM | POA: Diagnosis present

## 2017-05-11 DIAGNOSIS — G4733 Obstructive sleep apnea (adult) (pediatric): Secondary | ICD-10-CM | POA: Diagnosis present

## 2017-05-11 DIAGNOSIS — L03032 Cellulitis of left toe: Secondary | ICD-10-CM | POA: Diagnosis present

## 2017-05-11 DIAGNOSIS — R51 Headache: Secondary | ICD-10-CM | POA: Diagnosis present

## 2017-05-11 DIAGNOSIS — M869 Osteomyelitis, unspecified: Secondary | ICD-10-CM | POA: Diagnosis present

## 2017-05-11 DIAGNOSIS — Z6837 Body mass index (BMI) 37.0-37.9, adult: Secondary | ICD-10-CM

## 2017-05-11 DIAGNOSIS — K7581 Nonalcoholic steatohepatitis (NASH): Secondary | ICD-10-CM | POA: Diagnosis present

## 2017-05-11 DIAGNOSIS — Z888 Allergy status to other drugs, medicaments and biological substances status: Secondary | ICD-10-CM

## 2017-05-11 DIAGNOSIS — I2510 Atherosclerotic heart disease of native coronary artery without angina pectoris: Secondary | ICD-10-CM | POA: Diagnosis present

## 2017-05-11 DIAGNOSIS — N179 Acute kidney failure, unspecified: Secondary | ICD-10-CM | POA: Diagnosis present

## 2017-05-11 DIAGNOSIS — E669 Obesity, unspecified: Secondary | ICD-10-CM | POA: Diagnosis present

## 2017-05-11 DIAGNOSIS — Z9989 Dependence on other enabling machines and devices: Secondary | ICD-10-CM

## 2017-05-11 MED ORDER — SODIUM CHLORIDE 0.9 % IV SOLN
INTRAVENOUS | Status: DC
  Administered 2017-05-12: 1000 mL via INTRAVENOUS
  Administered 2017-05-12 – 2017-05-13 (×2): via INTRAVENOUS

## 2017-05-11 MED ORDER — INSULIN NPH (HUMAN) (ISOPHANE) 100 UNIT/ML ~~LOC~~ SUSP
100.0000 [IU] | Freq: Every day | SUBCUTANEOUS | Status: DC
  Administered 2017-05-12: 100 [IU] via SUBCUTANEOUS
  Filled 2017-05-11: qty 10

## 2017-05-11 MED ORDER — CHLORHEXIDINE GLUCONATE 4 % EX LIQD
60.0000 mL | Freq: Once | CUTANEOUS | Status: AC
  Administered 2017-05-12: 4 via TOPICAL
  Filled 2017-05-11: qty 60

## 2017-05-11 MED ORDER — SERTRALINE HCL 50 MG PO TABS
50.0000 mg | ORAL_TABLET | Freq: Every day | ORAL | Status: DC
  Administered 2017-05-13 – 2017-05-15 (×3): 50 mg via ORAL
  Filled 2017-05-11 (×3): qty 1

## 2017-05-11 MED ORDER — GABAPENTIN 300 MG PO CAPS
300.0000 mg | ORAL_CAPSULE | Freq: Three times a day (TID) | ORAL | Status: DC
  Administered 2017-05-12: 300 mg via ORAL
  Filled 2017-05-11: qty 1

## 2017-05-11 MED ORDER — IPRATROPIUM-ALBUTEROL 0.5-2.5 (3) MG/3ML IN SOLN: 3.0000 mL | RESPIRATORY_TRACT | Status: DC | PRN

## 2017-05-11 MED ORDER — INSULIN LISPRO 100 UNIT/ML ~~LOC~~ SOLN
SUBCUTANEOUS | Status: DC
Start: ? — End: 2017-05-11

## 2017-05-11 MED ORDER — RANOLAZINE ER 500 MG PO TB12
500.0000 mg | ORAL_TABLET | Freq: Two times a day (BID) | ORAL | Status: DC
  Administered 2017-05-12 – 2017-05-15 (×7): 500 mg via ORAL
  Filled 2017-05-11 (×7): qty 1

## 2017-05-11 MED ORDER — BUSPIRONE HCL 15 MG PO TABS
15.0000 mg | ORAL_TABLET | Freq: Three times a day (TID) | ORAL | Status: DC
  Administered 2017-05-12 – 2017-05-15 (×10): 15 mg via ORAL
  Filled 2017-05-11 (×13): qty 1

## 2017-05-11 MED ORDER — ENOXAPARIN SODIUM 40 MG/0.4ML ~~LOC~~ SOLN
40.0000 mg | SUBCUTANEOUS | Status: DC
  Administered 2017-05-13 – 2017-05-15 (×3): 40 mg via SUBCUTANEOUS
  Filled 2017-05-11 (×3): qty 0.4

## 2017-05-11 MED ORDER — GENERIC EXTERNAL MEDICATION
1.00 | Status: DC
Start: 2017-05-12 — End: 2017-05-11

## 2017-05-11 MED ORDER — MORPHINE SULFATE ER 30 MG PO TBCR
30.0000 mg | EXTENDED_RELEASE_TABLET | Freq: Three times a day (TID) | ORAL | Status: DC
  Administered 2017-05-12 – 2017-05-15 (×11): 30 mg via ORAL
  Filled 2017-05-11 (×11): qty 1

## 2017-05-11 MED ORDER — BUSPIRONE HCL 5 MG PO TABS
10.00 | ORAL_TABLET | ORAL | Status: DC
Start: 2017-05-12 — End: 2017-05-11

## 2017-05-11 MED ORDER — GENERIC EXTERNAL MEDICATION
Status: DC
Start: ? — End: 2017-05-11

## 2017-05-11 MED ORDER — INSULIN GLARGINE 100 UNIT/ML ~~LOC~~ SOLN
SUBCUTANEOUS | Status: DC
Start: 2017-05-12 — End: 2017-05-11

## 2017-05-11 MED ORDER — LISINOPRIL 20 MG PO TABS
40.00 | ORAL_TABLET | ORAL | Status: DC
Start: 2017-05-12 — End: 2017-05-11

## 2017-05-11 MED ORDER — SERTRALINE HCL 50 MG PO TABS
50.00 | ORAL_TABLET | ORAL | Status: DC
Start: 2017-05-12 — End: 2017-05-11

## 2017-05-11 MED ORDER — CARVEDILOL 12.5 MG PO TABS
25.00 | ORAL_TABLET | ORAL | Status: DC
Start: 2017-05-12 — End: 2017-05-11

## 2017-05-11 MED ORDER — INSULIN LISPRO 100 UNIT/ML ~~LOC~~ SOLN
SUBCUTANEOUS | Status: DC
Start: 2017-05-12 — End: 2017-05-11

## 2017-05-11 MED ORDER — ATORVASTATIN CALCIUM 80 MG PO TABS
80.0000 mg | ORAL_TABLET | Freq: Every day | ORAL | Status: DC
  Administered 2017-05-12 – 2017-05-14 (×3): 80 mg via ORAL
  Filled 2017-05-11 (×3): qty 1

## 2017-05-11 MED ORDER — ASPIRIN 81 MG PO CHEW
81.00 | CHEWABLE_TABLET | ORAL | Status: DC
Start: 2017-05-12 — End: 2017-05-11

## 2017-05-11 MED ORDER — GENERIC EXTERNAL MEDICATION
500.00 | Status: DC
Start: 2017-05-12 — End: 2017-05-11

## 2017-05-11 MED ORDER — ACETAMINOPHEN 325 MG PO TABS: 650.0000 mg | ORAL_TABLET | Freq: Four times a day (QID) | ORAL | Status: DC | PRN

## 2017-05-11 MED ORDER — INSULIN ASPART 100 UNIT/ML ~~LOC~~ SOLN: 10.0000 [IU] | Freq: Three times a day (TID) | SUBCUTANEOUS | Status: DC

## 2017-05-11 MED ORDER — SODIUM CHLORIDE 0.9 % IV SOLN
INTRAVENOUS | Status: DC
Start: ? — End: 2017-05-11

## 2017-05-11 MED ORDER — PRAVASTATIN SODIUM 40 MG PO TABS
40.00 | ORAL_TABLET | ORAL | Status: DC
Start: 2017-05-12 — End: 2017-05-11

## 2017-05-11 MED ORDER — HYDROCHLOROTHIAZIDE 25 MG PO TABS
25.00 | ORAL_TABLET | ORAL | Status: DC
Start: 2017-05-12 — End: 2017-05-11

## 2017-05-11 MED ORDER — GABAPENTIN 100 MG PO CAPS
100.00 | ORAL_CAPSULE | ORAL | Status: DC
Start: 2017-05-12 — End: 2017-05-11

## 2017-05-11 MED ORDER — TAPENTADOL HCL 50 MG PO TABS
50.00 | ORAL_TABLET | ORAL | Status: DC
Start: 2017-05-12 — End: 2017-05-11

## 2017-05-11 MED ORDER — EZETIMIBE 10 MG PO TABS
10.0000 mg | ORAL_TABLET | Freq: Every day | ORAL | Status: DC
  Administered 2017-05-13 – 2017-05-15 (×3): 10 mg via ORAL
  Filled 2017-05-11 (×3): qty 1

## 2017-05-11 MED ORDER — MORPHINE SULFATE 15 MG PO TABS
15.0000 mg | ORAL_TABLET | Freq: Four times a day (QID) | ORAL | Status: DC | PRN
  Administered 2017-05-12 – 2017-05-14 (×4): 15 mg via ORAL
  Filled 2017-05-11 (×5): qty 1

## 2017-05-11 MED ORDER — CEFAZOLIN SODIUM-DEXTROSE 2-4 GM/100ML-% IV SOLN
2.0000 g | INTRAVENOUS | Status: AC
  Administered 2017-05-12: 2 g via INTRAVENOUS
  Filled 2017-05-11 (×2): qty 100

## 2017-05-11 MED ORDER — INSULIN ASPART 100 UNIT/ML ~~LOC~~ SOLN
0.0000 [IU] | Freq: Every day | SUBCUTANEOUS | Status: DC
  Administered 2017-05-12: 3 [IU] via SUBCUTANEOUS

## 2017-05-11 MED ORDER — ONDANSETRON HCL 4 MG PO TABS: 4.0000 mg | ORAL_TABLET | Freq: Four times a day (QID) | ORAL | Status: DC | PRN

## 2017-05-11 MED ORDER — ACETAMINOPHEN 650 MG RE SUPP: 650.0000 mg | Freq: Four times a day (QID) | RECTAL | Status: DC | PRN

## 2017-05-11 MED ORDER — ONDANSETRON HCL 4 MG/2ML IJ SOLN: 4.0000 mg | Freq: Four times a day (QID) | INTRAMUSCULAR | Status: DC | PRN

## 2017-05-11 MED ORDER — CARVEDILOL 25 MG PO TABS
25.0000 mg | ORAL_TABLET | Freq: Two times a day (BID) | ORAL | Status: DC
  Administered 2017-05-12 – 2017-05-15 (×7): 25 mg via ORAL
  Filled 2017-05-11 (×7): qty 1

## 2017-05-11 NOTE — H&P (Signed)
History and Physical    [name] LKT:[id number] DOB: May 10, 1955 DOA: 05/11/2017  Referring MD/NP/PA: Dr. Cleon Gustin PCP: Forrest Moron, MD  Patient coming from: Transfer from [facility]  Chief Complaint: Altered mental status  I have personally briefly reviewed patient's old medical records in [name]   HPI: [name] is a 62 y.o. female with medical history significant of DM type II with R and L diabetic foot ulcers, peripheral neuropathy, HTN, CKD stage 3, NASH, OSA w/ CPAP, H/o  CVA  with residual left-sided weakness, TIAs, chronic pain, and anxiety; who presented to [facility] yesterday after being found to be acutely altered by the patient's daughter.  Patient was reported to be frequently "zoning out" with slurred speech.  The patient reports that she did not notice any significant changes.  Patient was previously noted to have acted similarly during previous TIAs.  She complained of associated symptoms of a headache, urinary frequency, and intermittent nonproductive cough.  Denies having any significant fever, chills, nausea, vomiting, or dysuria.  Patient was noted to have recently been changed from [name] to MS Contin 30 mg 3 times daily and MS for breakthrough pain on  IR 4/by Dr. Mirna Mires of pain management.  She also notes that she has upcoming surgery with Dr. Sharol Given on 4/10 for bilateral amputation of great toes for osteomyelitis.  Denies any worsening drainage or erythema of the wounds.  Patient's vitals were noted to be relatively unremarkable. Patient was briefly placed on nasal cannula oxygen but never seemed to be hypoxic.  Labs revealed WBC 7.2, hemoglobin 11.5, sodium 130, BUN 37, creatinine 2.18.  CT scan of the brain showed no acute abnormalities.  Chest x-ray showed mild bilateral bibasilar densities likely secondary to subsegmental atelectasis and less likely pneumonia.  Urinalysis listed show any acute signs of  infection. Patient was given 1 dose of azithromycin and ceftriaxone.  Patient had family requested transfer for pending surgery.    ED Course:  As seen above  Review of Systems  Constitutional: Positive for malaise/fatigue. Negative for chills and fever.  HENT: Negative for ear discharge and nosebleeds.   Eyes: Negative for pain and discharge.  Respiratory: Positive for cough and shortness of breath.   Cardiovascular: Positive for leg swelling. Negative for chest pain.  Gastrointestinal: Negative for abdominal pain, diarrhea, nausea and vomiting.  Genitourinary: Positive for frequency. Negative for dysuria.  Musculoskeletal: Positive for myalgias. Negative for falls.  Skin: Negative for itching and rash.  Neurological: Positive for speech change and headaches. Negative for focal weakness.  Psychiatric/Behavioral: Negative for memory loss and substance abuse.    Past Medical History:  Diagnosis Date  . Allergy   . Arthritis   . Chronic kidney disease   . Chronic pain   . Coronary artery disease   . Diabetes mellitus without complication ([place])   . Dyspnea   . GERD (gastroesophageal reflux disease)   . Heart murmur   . Hypertension   . Liver disease   . NASH (nonalcoholic steatohepatitis) 09/16/2016  . Obstructive sleep apnea   . Osteomyelitis ([place])   . Peripheral vascular disease ([place])   . Sleep apnea   . Stroke ([place])   . Ulcers of both great toes ([place]) 11/07/2016   right great toe, started as finger nail size and grew in 6 days     Past Surgical History:  Procedure Laterality Date  . ABDOMINAL HYSTERECTOMY    . BACK SURGERY    .  CESAREAN SECTION    . CHOLECYSTECTOMY    . HERNIA REPAIR    . JOINT REPLACEMENT    . KNEE SURGERY    . SPINE SURGERY    . TOE AMPUTATION    . TOE AMPUTATION Bilateral    3 on right foot 2 left foot   . TONSILLECTOMY    . TUBAL LIGATION       reports that she has never smoked. She has never used smokeless tobacco. She reports that she  does not drink alcohol or use drugs.  Allergies  Allergen Reactions  . Furosemide Other (See Comments)    Dehydration leading to kidney failure  "my kidneys shutdown"  . Norvasc [Amlodipine Besylate] Swelling    "Severe edema"  . Prednisone Other (See Comments)    Ketoacidosis  . Vancomycin Other (See Comments)    Total organ shut down "It shut my kidneys down"  . Amlodipine Swelling    SWELLING/EDEMA REACTION UNSPECIFIED   . Clindamycin/Lincomycin Hives and Itching  . Doxycycline Hives and Rash  . Propranolol Cough    Bronchospasms  . Ibuprofen Other (See Comments)    WAS TOLD TO NOT TAKE THIS BECAUSE OF HER KIDNEYS AND LIVER  . Lisinopril Other (See Comments)    WAS TOLD TO NOT TAKE THIS BECAUSE OF HER KIDNEYS AND LIVER  . Lovastatin Other (See Comments)    WAS TOLD TO NOT TAKE THIS BECAUSE OF HER KIDNEYS AND LIVER  . Norvasc [Amlodipine Besylate] Other (See Comments)    Severe edema  . Nsaids Other (See Comments)    WAS TOLD TO NOT TAKE THIS BECAUSE OF HER KIDNEYS AND LIVER  . Sulfa Antibiotics Nausea Only    Family History  Problem Relation Age of Onset  . Cancer Mother        Thymoma, metastatic  . Heart disease Father   . Stroke Father   . Stroke Maternal Grandfather   . Heart disease Paternal Grandfather   . Multiple sclerosis Sister   . Epilepsy Sister   . Diabetes Neg Hx     Prior to Admission medications   Medication Sig Start Date End Date Taking? Authorizing Provider  atorvastatin (LIPITOR) 80 MG tablet Take 1 tablet (80 mg total) by mouth daily at 6 PM. 02/08/17   Forrest Moron, MD  BD ULTRA-FINE PEN NEEDLES 29G X 12.7MM MISC 2 packages with 100 in each package 04/21/17   Forrest Moron, MD  busPIRone (BUSPAR) 15 MG tablet Take 1 tablet (15 mg total) by mouth 3 (three) times daily. 02/08/17   Stallings, Gwendolyn Fill A, MD  BYETTA 5 MCG PEN 5 MCG/0.02ML SOPN injection INJECT 0.02 MILLILITERS(5 MCG TOTAL) SUBCUTANEOUSLY TWICE A DAY WITH MEALS 04/30/17   Delia Chimes A, MD  carvedilol (COREG) 25 MG tablet Take 1 tablet (25 mg total) by mouth 2 (two) times daily. 11/23/16   Forrest Moron, MD  clopidogrel (PLAVIX) 75 MG tablet Take 1 tablet (75 mg total) by mouth daily. 02/08/17   Forrest Moron, MD  ezetimibe (ZETIA) 10 MG tablet Take 1 tablet (10 mg total) by mouth daily. 02/08/17   Forrest Moron, MD  gabapentin (NEURONTIN) 300 MG capsule Take 1 capsule (300 mg total) by mouth 3 (three) times daily. 12/28/16   Forrest Moron, MD  insulin aspart (NOVOLOG) 100 UNIT/ML FlexPen Inject 10 Units into the skin 3 (three) times daily before meals. Take only if blood sugar is over 300 04/29/17   Renato Shin,  MD  insulin NPH Human (NOVOLIN N) 100 UNIT/ML injection Inject 200 units each morning in skin. 04/30/17   Renato Shin, MD  INSULIN SYRINGE 1CC/29G (B-D INS SYR ULTRAFINE 1CC/29G) 29G X 1/2" 1 ML MISC Used to give insulin injections 4x daily. 04/30/17   Renato Shin, MD  ranolazine (RANEXA) 1000 MG SR tablet Take 1 tablet (1,000 mg total) by mouth 2 (two) times daily. 04/13/17   Park Liter, MD  sertraline (ZOLOFT) 50 MG tablet Take 1 tablet (50 mg total) by mouth daily. 11/23/16   Forrest Moron, MD  Tapentadol HCl (NUCYNTA) 100 MG TABS Take 1 tablet by mouth 4 (four) times daily.    [provider]  tiZANidine (ZANAFLEX) 4 MG tablet Take 4 mg by mouth 3 (three) times daily.    [provider]    Physical Exam:  Constitutional: Obese female in NAD, calm, comfortable Vitals:   05/11/17 2208  BP: (!) 108/57  Pulse: 69  Resp: 19  Temp: 98.5 F (36.9 C)  TempSrc: Oral  SpO2: 98%  Weight: 104.8 kg (231 lb 0.7 oz)  Height: 5' 6"  (1.676 m)   Eyes: PERRL, lids and conjunctivae normal ENMT: Mucous membranes are dry. Posterior pharynx clear of any exudate or lesions.  Neck: normal, supple, no masses, no thyromegaly Respiratory: clear to auscultation bilaterally, no wheezing, no crackles. Normal respiratory effort. No  accessory muscle use.  Cardiovascular: Regular rate and rhythm, no murmurs / rubs / gallops. No extremity edema. 2+ pedal pulses. No carotid bruits.  Abdomen: Large ventral wall hernia present,  no significant tenderness noted.  No masses palpated. No hepatosplenomegaly. Bowel sounds positive.  Musculoskeletal: no clubbing / cyanosis. No joint deformity upper and lower extremities. Good ROM, no contractures. Normal muscle tone.  Skin: Ulceration noted to the left great toe with purulent drainage noted, and right palmar aspect of foot near great toe without significant drainage noted. neurologic: CN 2-12 grossly intact. Sensation intact, DTR normal. Strength 5/5 in all 4.  Psychiatric: Normal judgment and insight. Alert and oriented x 3. Normal mood.     Labs on Admission: I have personally reviewed following labs and imaging studies  CBC: No results for input(s): WBC, NEUTROABS, HGB, HCT, MCV, PLT in the last 168 hours. Basic Metabolic Panel: No results for input(s): NA, K, CL, CO2, GLUCOSE, BUN, CREATININE, CALCIUM, MG, PHOS in the last 168 hours. GFR: CrCl cannot be calculated (Patient's most recent lab result is older than the maximum 21 days allowed.). Liver Function Tests: No results for input(s): AST, ALT, ALKPHOS, BILITOT, PROT, ALBUMIN in the last 168 hours. No results for input(s): LIPASE, AMYLASE in the last 168 hours. No results for input(s): AMMONIA in the last 168 hours. Coagulation Profile: No results for input(s): INR, PROTIME in the last 168 hours. Cardiac Enzymes: No results for input(s): CKTOTAL, CKMB, CKMBINDEX, TROPONINI in the last 168 hours. BNP (last 3 results) No results for input(s): PROBNP in the last 8760 hours. HbA1C: No results for input(s): HGBA1C in the last 72 hours. CBG: No results for input(s): GLUCAP in the last 168 hours. Lipid Profile: No results for input(s): CHOL, HDL, LDLCALC, TRIG, CHOLHDL, LDLDIRECT in the last 72 hours. Thyroid Function  Tests: No results for input(s): TSH, T4TOTAL, FREET4, T3FREE, THYROIDAB in the last 72 hours. Anemia Panel: No results for input(s): VITAMINB12, FOLATE, FERRITIN, TIBC, IRON, RETICCTPCT in the last 72 hours. Urine analysis:    Component Value Date/Time   COLORURINE YELLOW 04/04/2017 1951  APPEARANCEUR CLEAR 04/04/2017 1951   LABSPEC 1.019 04/04/2017 1951   PHURINE 5.0 04/04/2017 1951   GLUCOSEU NEGATIVE 04/04/2017 1951   HGBUR NEGATIVE 04/04/2017 [place] NEGATIVE 04/04/2017 1951   BILIRUBINUR negative 11/23/2016 1556   KETONESUR NEGATIVE 04/04/2017 1951   PROTEINUR 100 (A) 04/04/2017 1951   UROBILINOGEN 0.2 11/23/2016 1556   NITRITE NEGATIVE 04/04/2017 1951   LEUKOCYTESUR NEGATIVE 04/04/2017 1951   Sepsis Labs: No results found for this or any previous visit (from the past 240 hour(s)).   Radiological Exams on Admission: No results found.  EKG: Independently reviewed.  Sinus rhythm at 72 bpm with bifascicular block  Assessment/Plan Acute encephalopathy: Resolved.  Patient noted to be to be acutely altered response.  CT scan of the brain was negative for any acute abnormalities.  Patient appears to be back at her baseline. Question is secondary to recent change in medications vs TIA vs. other.  - Admit to telemetry bed - Neurochecks  Osteomyelitis of the bilateral great toes, diabetic foot ulcers: Patient is scheduled to have bilateral toe amputation by Dr. Sharol Given in a.m.  - N.p.o. after midnight - Orthopedics to see in a.m., follow-up for further recommendation  Atelectasis: Patient's initial x-ray noted show most likely signs of atelectasis and less likely appears to be pneumonia.  Patient was given IV Rocephin and azithromycin at outside facility for presumed community-acquired pneumonia. - Incentive spirometry   Acute kidney injury on chronic kidney disease: Patient baseline creatinine previously noted to be around 1.3 presented to the outside hospital with a  creatinine of 2.18 and BUN 37.  Suspect prerenal cause of symptoms related with uncontrolled diabetes.  - Gentle IV fluids overnight - Recheck BMP in a.m.  DM type II: Last hemoglobin A1c noted to be 12.9 on 03/26/2017. - Hypoglycemic protocol - Decreased NPH morning dose in half to 100 units for pending procedure - CBGs q. before meals with home insulin regimen - Discharge regimen as needed  Chronic pain syndrome: Patient followed by Dr. Mirna Mires of regular health and restoration.  Recent change in medications from Nucentya to MS Contin - Continue home pain medication regimen - If symptoms recur may warrant reduction in pain medication   Peripheral neuropathy - Continue dose adjusted gabapentin  Anxiety: stable - Continue Zoloft and BuSpar  OSA on CPAP - CPAP per Rt  DVT prophylaxis: Lovenox Code Status: Full Family Communication: Family present at bedside Disposition Plan: To be determined Consults called: None Admission status: Inpatient  Norval Morton MD [facility] Pager [phone]   If 7PM-7AM, please contact night-coverage [email or web address] Password [email or web address]  05/11/2017, 10:18 PM

## 2017-05-12 ENCOUNTER — Inpatient Hospital Stay (HOSPITAL_COMMUNITY): Payer: Medicare HMO | Admitting: Anesthesiology

## 2017-05-12 ENCOUNTER — Other Ambulatory Visit: Payer: Self-pay

## 2017-05-12 ENCOUNTER — Encounter (HOSPITAL_COMMUNITY): Payer: Self-pay | Admitting: Surgery

## 2017-05-12 ENCOUNTER — Encounter (HOSPITAL_COMMUNITY)
Admission: EM | Disposition: A | Discharge status: Skilled Nursing Facility | Payer: Self-pay | Source: Other Acute Inpatient Hospital | Attending: Internal Medicine

## 2017-05-12 ENCOUNTER — Ambulatory Visit (HOSPITAL_COMMUNITY): Admission: RE | Admit: 2017-05-12 | Payer: Medicare HMO | Source: Ambulatory Visit | Admitting: Orthopedic Surgery

## 2017-05-12 ENCOUNTER — Ambulatory Visit: Payer: Medicare HMO | Admitting: Endocrinology

## 2017-05-12 DIAGNOSIS — Z9989 Dependence on other enabling machines and devices: Secondary | ICD-10-CM

## 2017-05-12 DIAGNOSIS — M869 Osteomyelitis, unspecified: Secondary | ICD-10-CM

## 2017-05-12 DIAGNOSIS — G9340 Encephalopathy, unspecified: Secondary | ICD-10-CM

## 2017-05-12 DIAGNOSIS — G4733 Obstructive sleep apnea (adult) (pediatric): Secondary | ICD-10-CM

## 2017-05-12 DIAGNOSIS — N183 Chronic kidney disease, stage 3 (moderate): Secondary | ICD-10-CM

## 2017-05-12 DIAGNOSIS — G894 Chronic pain syndrome: Secondary | ICD-10-CM

## 2017-05-12 DIAGNOSIS — E1122 Type 2 diabetes mellitus with diabetic chronic kidney disease: Secondary | ICD-10-CM

## 2017-05-12 DIAGNOSIS — Z794 Long term (current) use of insulin: Secondary | ICD-10-CM

## 2017-05-12 HISTORY — PX: AMPUTATION: SHX166

## 2017-05-12 LAB — SURGICAL PCR SCREEN
MRSA, PCR: NEGATIVE
STAPHYLOCOCCUS AUREUS: POSITIVE — AB

## 2017-05-12 LAB — CBC WITH DIFFERENTIAL/PLATELET
BASOS ABS: 0 10*3/uL (ref 0.0–0.1)
Basophils Relative: 0 %
EOS ABS: 0.1 10*3/uL (ref 0.0–0.7)
EOS PCT: 3 %
HCT: 32.3 % — ABNORMAL LOW (ref 36.0–46.0)
Hemoglobin: 10.4 g/dL — ABNORMAL LOW (ref 12.0–15.0)
LYMPHS PCT: 20 %
Lymphs Abs: 1.1 10*3/uL (ref 0.7–4.0)
MCH: 27.4 pg (ref 26.0–34.0)
MCHC: 32.2 g/dL (ref 30.0–36.0)
MCV: 85.2 fL (ref 78.0–100.0)
Monocytes Absolute: 0.4 10*3/uL (ref 0.1–1.0)
Monocytes Relative: 8 %
Neutro Abs: 3.7 10*3/uL (ref 1.7–7.7)
Neutrophils Relative %: 69 %
PLATELETS: 179 10*3/uL (ref 150–400)
RBC: 3.79 MIL/uL — AB (ref 3.87–5.11)
RDW: 14.3 % (ref 11.5–15.5)
WBC: 5.3 10*3/uL (ref 4.0–10.5)

## 2017-05-12 LAB — GLUCOSE, CAPILLARY
Glucose-Capillary: 275 mg/dL — ABNORMAL HIGH (ref 65–99)
Glucose-Capillary: 256 mg/dL — ABNORMAL HIGH (ref 65–99)
Glucose-Capillary: 273 mg/dL — ABNORMAL HIGH (ref 65–99)
Glucose-Capillary: 283 mg/dL — ABNORMAL HIGH (ref 65–99)
Glucose-Capillary: 281 mg/dL — ABNORMAL HIGH (ref 65–99)
Glucose-Capillary: 251 mg/dL — ABNORMAL HIGH (ref 65–99)
Glucose-Capillary: 222 mg/dL — ABNORMAL HIGH (ref 65–99)
Glucose-Capillary: 186 mg/dL — ABNORMAL HIGH (ref 65–99)

## 2017-05-12 LAB — BASIC METABOLIC PANEL
Anion gap: 10 (ref 5–15)
BUN: 45 mg/dL — AB (ref 6–20)
CO2: 22 mmol/L (ref 22–32)
CREATININE: 2.08 mg/dL — AB (ref 0.44–1.00)
Calcium: 7.9 mg/dL — ABNORMAL LOW (ref 8.9–10.3)
Chloride: 101 mmol/L (ref 101–111)
GFR calc Af Amer: 28 mL/min — ABNORMAL LOW (ref >60)
GFR, EST NON AFRICAN AMERICAN: 24 mL/min — AB (ref >60)
Glucose, Bld: 287 mg/dL — ABNORMAL HIGH (ref 65–99)
Potassium: 4.8 mmol/L (ref 3.5–5.1)
SODIUM: 133 mmol/L — AB (ref 135–145)

## 2017-05-12 LAB — PROTIME-INR
INR: 1.08
PROTHROMBIN TIME: 13.9 s (ref 11.4–15.2)

## 2017-05-12 LAB — APTT: APTT: 36 s (ref 24–36)

## 2017-05-12 SURGERY — AMPUTATION DIGIT
Anesthesia: General | Laterality: Bilateral

## 2017-05-12 MED ORDER — POLYETHYLENE GLYCOL 3350 17 G PO PACK
17.0000 g | PACK | Freq: Every day | ORAL | Status: DC | PRN
  Administered 2017-05-14: 17 g via ORAL
  Filled 2017-05-12: qty 1

## 2017-05-12 MED ORDER — METHOCARBAMOL 1000 MG/10ML IJ SOLN
500.0000 mg | Freq: Four times a day (QID) | INTRAVENOUS | Status: DC | PRN
  Filled 2017-05-12: qty 5

## 2017-05-12 MED ORDER — LACTATED RINGERS IV SOLN
INTRAVENOUS | Status: DC
  Administered 2017-05-12: 10:00:00 via INTRAVENOUS

## 2017-05-12 MED ORDER — MIDAZOLAM HCL 5 MG/5ML IJ SOLN
INTRAMUSCULAR | Status: DC | PRN
  Administered 2017-05-12: 1 mg via INTRAVENOUS

## 2017-05-12 MED ORDER — HYDROMORPHONE HCL 1 MG/ML IJ SOLN: 0.2500 mg | INTRAMUSCULAR | Status: DC | PRN

## 2017-05-12 MED ORDER — LIDOCAINE 2% (20 MG/ML) 5 ML SYRINGE
INTRAMUSCULAR | Status: DC | PRN
  Administered 2017-05-12: 60 mg via INTRAVENOUS

## 2017-05-12 MED ORDER — OXYCODONE HCL 5 MG PO TABS: 5.0000 mg | ORAL_TABLET | Freq: Once | ORAL | Status: DC | PRN

## 2017-05-12 MED ORDER — MAGNESIUM CITRATE PO SOLN: 1.0000 | Freq: Once | ORAL | Status: DC | PRN

## 2017-05-12 MED ORDER — METOCLOPRAMIDE HCL 5 MG/ML IJ SOLN: 5.0000 mg | Freq: Three times a day (TID) | INTRAMUSCULAR | Status: DC | PRN

## 2017-05-12 MED ORDER — METOCLOPRAMIDE HCL 5 MG PO TABS: 5.0000 mg | ORAL_TABLET | Freq: Three times a day (TID) | ORAL | Status: DC | PRN

## 2017-05-12 MED ORDER — LIDOCAINE 2% (20 MG/ML) 5 ML SYRINGE
INTRAMUSCULAR | Status: AC
  Filled 2017-05-12: qty 5

## 2017-05-12 MED ORDER — METHOCARBAMOL 500 MG PO TABS
500.0000 mg | ORAL_TABLET | Freq: Four times a day (QID) | ORAL | Status: DC | PRN
  Administered 2017-05-12: 500 mg via ORAL
  Filled 2017-05-12: qty 1

## 2017-05-12 MED ORDER — MIDAZOLAM HCL 2 MG/2ML IJ SOLN
INTRAMUSCULAR | Status: AC
  Filled 2017-05-12: qty 2

## 2017-05-12 MED ORDER — SODIUM CHLORIDE 0.9 % IV SOLN: INTRAVENOUS | Status: DC

## 2017-05-12 MED ORDER — ONDANSETRON HCL 4 MG PO TABS: 4.0000 mg | ORAL_TABLET | Freq: Four times a day (QID) | ORAL | Status: DC | PRN

## 2017-05-12 MED ORDER — INSULIN ASPART 100 UNIT/ML ~~LOC~~ SOLN
0.0000 [IU] | Freq: Three times a day (TID) | SUBCUTANEOUS | Status: DC
  Administered 2017-05-12: 3 [IU] via SUBCUTANEOUS
  Administered 2017-05-13: 1 [IU] via SUBCUTANEOUS
  Administered 2017-05-13: 2 [IU] via SUBCUTANEOUS
  Administered 2017-05-13: 5 [IU] via SUBCUTANEOUS
  Administered 2017-05-14 (×2): 3 [IU] via SUBCUTANEOUS
  Administered 2017-05-14: 2 [IU] via SUBCUTANEOUS
  Administered 2017-05-15 (×2): 3 [IU] via SUBCUTANEOUS

## 2017-05-12 MED ORDER — INSULIN GLARGINE 100 UNIT/ML ~~LOC~~ SOLN
20.0000 [IU] | Freq: Every day | SUBCUTANEOUS | Status: DC
  Administered 2017-05-12 – 2017-05-13 (×2): 20 [IU] via SUBCUTANEOUS
  Filled 2017-05-12 (×2): qty 0.2

## 2017-05-12 MED ORDER — MUPIROCIN 2 % EX OINT
1.0000 "application " | TOPICAL_OINTMENT | Freq: Two times a day (BID) | CUTANEOUS | Status: DC
  Administered 2017-05-12 – 2017-05-15 (×7): 1 via NASAL
  Filled 2017-05-12 (×4): qty 22

## 2017-05-12 MED ORDER — PROPOFOL 10 MG/ML IV BOLUS
INTRAVENOUS | Status: AC
  Filled 2017-05-12: qty 20

## 2017-05-12 MED ORDER — EPHEDRINE 5 MG/ML INJ
INTRAVENOUS | Status: AC
  Filled 2017-05-12: qty 10

## 2017-05-12 MED ORDER — PHENYLEPHRINE 40 MCG/ML (10ML) SYRINGE FOR IV PUSH (FOR BLOOD PRESSURE SUPPORT)
PREFILLED_SYRINGE | INTRAVENOUS | Status: AC
  Filled 2017-05-12: qty 10

## 2017-05-12 MED ORDER — CEFAZOLIN SODIUM-DEXTROSE 2-4 GM/100ML-% IV SOLN
2.0000 g | Freq: Three times a day (TID) | INTRAVENOUS | Status: AC
  Administered 2017-05-12 – 2017-05-13 (×3): 2 g via INTRAVENOUS
  Filled 2017-05-12 (×3): qty 100

## 2017-05-12 MED ORDER — CHLORHEXIDINE GLUCONATE CLOTH 2 % EX PADS
6.0000 | MEDICATED_PAD | Freq: Every day | CUTANEOUS | Status: DC
  Administered 2017-05-12 – 2017-05-15 (×3): 6 via TOPICAL

## 2017-05-12 MED ORDER — ONDANSETRON HCL 4 MG/2ML IJ SOLN
INTRAMUSCULAR | Status: DC | PRN
  Administered 2017-05-12: 4 mg via INTRAVENOUS

## 2017-05-12 MED ORDER — DOCUSATE SODIUM 100 MG PO CAPS
100.0000 mg | ORAL_CAPSULE | Freq: Two times a day (BID) | ORAL | Status: DC
  Administered 2017-05-12 – 2017-05-15 (×7): 100 mg via ORAL
  Filled 2017-05-12 (×7): qty 1

## 2017-05-12 MED ORDER — INSULIN ASPART 100 UNIT/ML ~~LOC~~ SOLN
0.0000 [IU] | Freq: Every day | SUBCUTANEOUS | Status: DC
  Administered 2017-05-14: 3 [IU] via SUBCUTANEOUS

## 2017-05-12 MED ORDER — ONDANSETRON HCL 4 MG/2ML IJ SOLN
INTRAMUSCULAR | Status: AC
  Filled 2017-05-12: qty 2

## 2017-05-12 MED ORDER — PHENYLEPHRINE 40 MCG/ML (10ML) SYRINGE FOR IV PUSH (FOR BLOOD PRESSURE SUPPORT)
PREFILLED_SYRINGE | INTRAVENOUS | Status: DC | PRN
  Administered 2017-05-12 (×3): 80 ug via INTRAVENOUS

## 2017-05-12 MED ORDER — GABAPENTIN 300 MG PO CAPS
300.0000 mg | ORAL_CAPSULE | Freq: Two times a day (BID) | ORAL | Status: DC
  Administered 2017-05-12 – 2017-05-15 (×6): 300 mg via ORAL
  Filled 2017-05-12 (×6): qty 1

## 2017-05-12 MED ORDER — PROPOFOL 10 MG/ML IV BOLUS
INTRAVENOUS | Status: DC | PRN
  Administered 2017-05-12: 160 mg via INTRAVENOUS

## 2017-05-12 MED ORDER — BISACODYL 10 MG RE SUPP: 10.0000 mg | Freq: Every day | RECTAL | Status: DC | PRN

## 2017-05-12 MED ORDER — INSULIN ASPART 100 UNIT/ML ~~LOC~~ SOLN
5.0000 [IU] | Freq: Once | SUBCUTANEOUS | Status: AC
  Administered 2017-05-12: 5 [IU] via SUBCUTANEOUS

## 2017-05-12 MED ORDER — EPHEDRINE SULFATE-NACL 50-0.9 MG/10ML-% IV SOSY
PREFILLED_SYRINGE | INTRAVENOUS | Status: DC | PRN
  Administered 2017-05-12: 5 mg via INTRAVENOUS
  Administered 2017-05-12 (×2): 10 mg via INTRAVENOUS

## 2017-05-12 MED ORDER — ONDANSETRON HCL 4 MG/2ML IJ SOLN: 4.0000 mg | Freq: Four times a day (QID) | INTRAMUSCULAR | Status: DC | PRN

## 2017-05-12 MED ORDER — OXYCODONE HCL 5 MG/5ML PO SOLN: 5.0000 mg | Freq: Once | ORAL | Status: DC | PRN

## 2017-05-12 MED ORDER — FENTANYL CITRATE (PF) 250 MCG/5ML IJ SOLN
INTRAMUSCULAR | Status: AC
  Filled 2017-05-12: qty 5

## 2017-05-12 MED ORDER — FENTANYL CITRATE (PF) 250 MCG/5ML IJ SOLN
INTRAMUSCULAR | Status: DC | PRN
  Administered 2017-05-12: 25 ug via INTRAVENOUS

## 2017-05-12 MED ORDER — 0.9 % SODIUM CHLORIDE (POUR BTL) OPTIME
TOPICAL | Status: DC | PRN
  Administered 2017-05-12: 1000 mL

## 2017-05-12 SURGICAL SUPPLY — 29 items
BLADE SURG 21 STRL SS (BLADE) ×3 IMPLANT
BNDG COHESIVE 4X5 TAN STRL (GAUZE/BANDAGES/DRESSINGS) ×6 IMPLANT
BNDG ESMARK 4X9 LF (GAUZE/BANDAGES/DRESSINGS) IMPLANT
BNDG GAUZE ELAST 4 BULKY (GAUZE/BANDAGES/DRESSINGS) ×6 IMPLANT
COVER SURGICAL LIGHT HANDLE (MISCELLANEOUS) ×6 IMPLANT
DRAPE U-SHAPE 47X51 STRL (DRAPES) ×3 IMPLANT
DRSG ADAPTIC 3X8 NADH LF (GAUZE/BANDAGES/DRESSINGS) ×6 IMPLANT
DRSG PAD ABDOMINAL 8X10 ST (GAUZE/BANDAGES/DRESSINGS) ×3 IMPLANT
DURAPREP 26ML APPLICATOR (WOUND CARE) ×3 IMPLANT
ELECT REM PT RETURN 9FT ADLT (ELECTROSURGICAL) ×3
ELECTRODE REM PT RTRN 9FT ADLT (ELECTROSURGICAL) ×1 IMPLANT
GAUZE SPONGE 4X4 12PLY STRL (GAUZE/BANDAGES/DRESSINGS) IMPLANT
GAUZE SPONGE 4X4 12PLY STRL LF (GAUZE/BANDAGES/DRESSINGS) ×6 IMPLANT
GLOVE BIOGEL PI IND STRL 9 (GLOVE) ×1 IMPLANT
GLOVE BIOGEL PI INDICATOR 9 (GLOVE) ×2
GLOVE SURG ORTHO 9.0 STRL STRW (GLOVE) ×3 IMPLANT
GOWN STRL REUS W/ TWL XL LVL3 (GOWN DISPOSABLE) ×2 IMPLANT
GOWN STRL REUS W/TWL XL LVL3 (GOWN DISPOSABLE) ×4
KIT BASIN OR (CUSTOM PROCEDURE TRAY) ×3 IMPLANT
KIT TURNOVER KIT B (KITS) ×3 IMPLANT
MANIFOLD NEPTUNE II (INSTRUMENTS) ×3 IMPLANT
NEEDLE 22X1 1/2 (OR ONLY) (NEEDLE) IMPLANT
NS IRRIG 1000ML POUR BTL (IV SOLUTION) ×3 IMPLANT
PACK ORTHO EXTREMITY (CUSTOM PROCEDURE TRAY) ×3 IMPLANT
PAD ABD 8X10 STRL (GAUZE/BANDAGES/DRESSINGS) ×3 IMPLANT
PAD ARMBOARD 7.5X6 YLW CONV (MISCELLANEOUS) ×6 IMPLANT
SUT ETHILON 2 0 PSLX (SUTURE) ×3 IMPLANT
SYR CONTROL 10ML LL (SYRINGE) IMPLANT
TOWEL OR 17X26 10 PK STRL BLUE (TOWEL DISPOSABLE) ×3 IMPLANT

## 2017-05-12 NOTE — Anesthesia Preprocedure Evaluation (Signed)
Anesthesia Evaluation  Patient identified by MRN, date of birth, ID band Patient awake    Reviewed: Allergy & Precautions, NPO status , Patient's Chart, lab work & pertinent test results  Airway Mallampati: II       Dental   Pulmonary shortness of breath, sleep apnea , pneumonia, resolved,    breath sounds clear to auscultation       Cardiovascular hypertension, + angina + CAD and + Peripheral Vascular Disease   Rhythm:Regular Rate:Normal  EKG noted abnoraml   Neuro/Psych  Headaches, TIACVA    GI/Hepatic GERD  ,(+) Hepatitis -  Endo/Other  diabetesMorbid obesity  Renal/GU CRFRenal disease     Musculoskeletal  (+) Arthritis , Chronic pain   Abdominal (+) + obese,   Peds  Hematology   Anesthesia Other Findings   Reproductive/Obstetrics                             Anesthesia Physical Anesthesia Plan  ASA: IV  Anesthesia Plan: General   Post-op Pain Management:    Induction: Intravenous  PONV Risk Score and Plan: 4 or greater and Treatment may vary due to age or medical condition  Airway Management Planned: LMA  Additional Equipment:   Intra-op Plan:   Post-operative Plan: Extubation in OR  Informed Consent: I have reviewed the patients History and Physical, chart, labs and discussed the procedure including the risks, benefits and alternatives for the proposed anesthesia with the patient or authorized representative who has indicated his/her understanding and acceptance.   Dental advisory given  Plan Discussed with: CRNA  Anesthesia Plan Comments:         Anesthesia Quick Evaluation

## 2017-05-12 NOTE — Anesthesia Postprocedure Evaluation (Signed)
Anesthesia Post Note  Patient: [name]  Procedure(s) Performed: BILATERAL GREAT TOE AMPUTATION (Bilateral )     Patient location during evaluation: PACU Anesthesia Type: General Level of consciousness: awake and alert Pain management: pain level controlled Vital Signs Assessment: post-procedure vital signs reviewed and stable Respiratory status: spontaneous breathing, nonlabored ventilation, respiratory function stable and patient connected to nasal cannula oxygen Cardiovascular status: blood pressure returned to baseline and stable Postop Assessment: no apparent nausea or vomiting Anesthetic complications: no    Last Vitals:  Vitals:   05/12/17 1215 05/12/17 1237  BP: 103/71 (!) 107/47  Pulse: 68 61  Resp: 12 18  Temp:  36.6 C  SpO2: 94% 97%    Last Pain:  Vitals:   05/12/17 1237  TempSrc: Oral  PainSc:                  Lani Havlik,JAMES TERRILL

## 2017-05-12 NOTE — Progress Notes (Signed)
Patient ID: [name], female   DOB: Jul 21, 1955, 62 y.o.   MRN: [id number]  PROGRESS NOTE    [name]  KXF:[id number] DOB: [birth year] DOA: 05/11/2017 PCP: Forrest Moron, MD   Brief Narrative:  62 y.o. female with medical history significant of DM type II withRand Ldiabetic foot ulcers,peripheral neuropathy,HTN, CKD stage 3, NASH, OSA w/ CPAP, H/o CVA  with residual left-sided weakness, TIAs, chronic pain, and anxiety who presented to [facility] with altered mental status.  Patient was transferred to [facility] for surgical intervention by orthopedics for osteomyelitis of bilateral toes.   Assessment & Plan:   Principal Problem:   Acute encephalopathy Active Problems:   Essential hypertension   OSA on CPAP   Diabetes (HCC)   Chronic pain syndrome   Osteomyelitis of great toe of right foot (HCC)   Osteomyelitis of great toe of left foot (HCC)   Acute probably metabolic encephalopathy:  -Resolved.   CT scan of the brain was negative for any acute abnormalities.  Patient appears to be back at her baseline.  -Monitor mental status  Osteomyelitis of the bilateral great toes, diabetic foot ulcers:  -Status post surgical intervention today by orthopedics.  Follow further recommendations from orthopedics.  Wound care as per orthopedics. -PT evaluation    Acute kidney injury on chronic kidney disease: Patient baseline creatinine previously noted to be around 1.3 presented to the outside hospital with a creatinine of 2.18 and BUN 37.  Suspect prerenal cause of symptoms related with uncontrolled diabetes.  -Improving.  Continue IV fluids.  Repeat a.m. labs  DM type II: Last hemoglobin A1c noted to be 12.9 on 03/26/2017. -DC NPH. -Start Lantus 20 units nightly.  Continue sliding scale insulin coverage   chronic pain syndrome: Patient followed by Dr. Mirna Mires of [facility].  Recent change in medications from Nucentya to MS  Contin - Continue home pain medication regimen - If symptoms recur may warrant reduction in pain medication   Peripheral neuropathy - Continue dose adjusted gabapentin  Anxiety: stable - Continue Zoloft and BuSpar  OSA on CPAP -Continue CPAP  DVT prophylaxis: Lovenox Code Status: Full Family Communication: Daughter at bedside Disposition Plan: Depends on clinical outcome  Consultants: Orthopedics  Procedures:  Amputation of the left toe through the MTP joint.  First ray amputation on the right on 05/12/2017  Antimicrobials: Perioperative   Subjective: Patient seen and examined at bedside.  She complains of bilateral pain in her feet.  No overnight fever, nausea or vomiting.  Objective: Vitals:   05/12/17 1200 05/12/17 1213 05/12/17 1215 05/12/17 1237  BP: (!) 103/59 103/71 103/71 (!) 107/47  Pulse: 72 68 68 61  Resp: 20 16 12 18   Temp:  98 F (36.7 C)  97.8 F (36.6 C)  TempSrc:    Oral  SpO2: 100% 94% 94% 97%  Weight:      Height:        Intake/Output Summary (Last 24 hours) at 05/12/2017 1504 Last data filed at 05/12/2017 1129 Gross per 24 hour  Intake 500 ml  Output 10 ml  Net 490 ml   Filed Weights   05/11/17 2208  Weight: 104.8 kg (231 lb 0.7 oz)    Examination:  General exam: Appears calm and comfortable  Respiratory system: Bilateral decreased breath sound at bases Cardiovascular system: S1 & S2 heard, rate controlled gastrointestinal system: Abdomen is nondistended, soft and nontender. Normal bowel sounds heard. Central nervous system: Alert and oriented. No focal  neurological deficits. Moving extremities Extremities: No cyanosis, clubbing, edema; bilateral lower extremity dressing present Skin: No other rashes, lesions or ulcers Psychiatry: Anxious    Data Reviewed: I have personally reviewed following labs and imaging studies  CBC: Recent Labs  Lab 05/12/17 0715  WBC 5.3  NEUTROABS 3.7  HGB 10.4*  HCT 32.3*  MCV 85.2  PLT 794    Basic Metabolic Panel: Recent Labs  Lab 05/12/17 0715  NA 133*  K 4.8  CL 101  CO2 22  GLUCOSE 287*  BUN 45*  CREATININE 2.08*  CALCIUM 7.9*   GFR: Estimated Creatinine Clearance: 34.3 mL/min (A) (by C-G formula based on SCr of 2.08 mg/dL (H)). Liver Function Tests: No results for input(s): AST, ALT, ALKPHOS, BILITOT, PROT, ALBUMIN in the last 168 hours. No results for input(s): LIPASE, AMYLASE in the last 168 hours. No results for input(s): AMMONIA in the last 168 hours. Coagulation Profile: Recent Labs  Lab 05/12/17 0715  INR 1.08   Cardiac Enzymes: No results for input(s): CKTOTAL, CKMB, CKMBINDEX, TROPONINI in the last 168 hours. BNP (last 3 results) No results for input(s): PROBNP in the last 8760 hours. HbA1C: No results for input(s): HGBA1C in the last 72 hours. CBG: Recent Labs  Lab 05/12/17 0153 05/12/17 0813 05/12/17 1007 05/12/17 1149 05/12/17 1235  GLUCAP 256* 273* 283* 281* 251*   Lipid Profile: No results for input(s): CHOL, HDL, LDLCALC, TRIG, CHOLHDL, LDLDIRECT in the last 72 hours. Thyroid Function Tests: No results for input(s): TSH, T4TOTAL, FREET4, T3FREE, THYROIDAB in the last 72 hours. Anemia Panel: No results for input(s): VITAMINB12, FOLATE, FERRITIN, TIBC, IRON, RETICCTPCT in the last 72 hours. Sepsis Labs: No results for input(s): PROCALCITON, LATICACIDVEN in the last 168 hours.  Recent Results (from the past 240 hour(s))  Surgical pcr screen     Status: Abnormal   Collection Time: 05/11/17 10:16 PM  Result Value Ref Range Status   MRSA, PCR NEGATIVE NEGATIVE Final   Staphylococcus aureus POSITIVE (A) NEGATIVE Final    Comment: (NOTE) The Xpert SA Assay (FDA approved for NASAL specimens in patients 66 years of age and older), is one component of a comprehensive surveillance program. It is not intended to diagnose infection nor to guide or monitor treatment. Performed at [facility], [place] [address], [place],  [place] [address]          Radiology Studies: No results found.      Scheduled Meds: . atorvastatin  80 mg Oral q1800  . busPIRone  15 mg Oral TID  . carvedilol  25 mg Oral BID WC  . Chlorhexidine Gluconate Cloth  6 each Topical Daily  . docusate sodium  100 mg Oral BID  . enoxaparin (LOVENOX) injection  40 mg Subcutaneous Q24H  . ezetimibe  10 mg Oral Daily  . gabapentin  300 mg Oral BID  . insulin aspart  0-5 Units Subcutaneous QHS  . insulin aspart  10 Units Subcutaneous TID WC  . insulin NPH Human  100 Units Subcutaneous Q breakfast  . morphine  30 mg Oral Q8H  . mupirocin ointment  1 application Nasal BID  . ranolazine  500 mg Oral BID  . sertraline  50 mg Oral Daily   Continuous Infusions: . sodium chloride 1,000 mL (05/12/17 0150)  . sodium chloride    .  ceFAZolin (ANCEF) IV    . lactated ringers 10 mL/hr at 05/12/17 1010  . methocarbamol (ROBAXIN)  IV  LOS: 1 day        Aline August, MD [facility] Pager [phone]  If 7PM-7AM, please contact night-coverage [email or web address] Password [email or web address] 05/12/2017, 3:04 PM

## 2017-05-12 NOTE — Progress Notes (Signed)
Inpatient Diabetes Program Recommendations  AACE/ADA: New Consensus Statement on Inpatient Glycemic Control (2015)  Target Ranges:  Prepandial:   less than 140 mg/dL      Peak postprandial:   less than 180 mg/dL (1-2 hours)      Critically ill patients:  140 - 180 mg/dL   Lab Results  Component Value Date   GLUCAP 283 (H) 05/12/2017   HGBA1C 11.3 (H) 04/23/2017    Review of Glycemic Control Results for [name], [name] (MRN [id number]) as of 05/12/2017 11:34  Ref. Range 05/12/2017 01:53 05/12/2017 08:13 05/12/2017 10:07  Glucose-Capillary Latest Ref Range: 65 - 99 mg/dL 256 (H) 273 (H) 283 (H)   Diabetes history: Type 2 DM Outpatient Diabetes medications: NPH 200 units QAM, Novolog 10 units TID, Byetta 5 mcg BID Current orders for Inpatient glycemic control: NPH 100 units QAM, Novolog 10 units TID, Novolog 0-5 units QHS  Inpatient Diabetes Program Recommendations:    Would recommend changing inpatient regimen to establish better glycemic control. Patient has only received 8 units while inpatient, thus making NPH 100 units a higher risk for patient to have lows. Would recommend adding Lantus 20 units QHS, Novolog 0-9 units TID+HS. Could consider changing meal coverage to Novolog 5 units TID (assuming patient consumes >50%). Text page sent.  Thanks, Bronson Curb, MSN, RNC-OB Diabetes Coordinator [phone] (8a-5p)

## 2017-05-12 NOTE — Transfer of Care (Signed)
Immediate Anesthesia Transfer of Care Note  Patient: [name]  Procedure(s) Performed: BILATERAL GREAT TOE AMPUTATION (Bilateral )  Patient Location: PACU  Anesthesia Type:General  Level of Consciousness: drowsy and patient cooperative  Airway & Oxygen Therapy: Patient Spontanous Breathing and Patient connected to face mask oxygen  Post-op Assessment: Report given to RN, Post -op Vital signs reviewed and stable and Patient moving all extremities X 4  Post vital signs: Reviewed and stable  Last Vitals:  Vitals Value Taken Time  BP 121/63 05/12/2017 11:49 AM  Temp 36.6 C 05/12/2017 11:46 AM  Pulse 73 05/12/2017 11:49 AM  Resp 10 05/12/2017 11:49 AM  SpO2 100 % 05/12/2017 11:49 AM  Vitals shown include unvalidated device data.  Last Pain:  Vitals:   05/12/17 1146  TempSrc:   PainSc: (P) 0-No pain         Complications: No apparent anesthesia complications

## 2017-05-12 NOTE — H&P (Signed)
[name] is an 62 y.o. female.   Chief Complaint: Ulceration bilateral great toes with osteomyelitis. HPI: Patient is a 62 year old woman with poorly controlled diabetes her most recent hemoglobin A1c is 11.3.  Patient was drinking sweet tea in the office.  Her daughter states that when she finishes one sweet tea she has another.  Patient states she has a hole on the great toe bilaterally.  Patient states she just purchased 7 cases of soda.    Past Medical History:  Diagnosis Date  . Allergy   . Arthritis   . Chronic kidney disease   . Chronic pain   . Coronary artery disease   . Diabetes mellitus without complication ([place])   . Dyspnea   . GERD (gastroesophageal reflux disease)   . Heart murmur   . Hypertension   . Liver disease   . NASH (nonalcoholic steatohepatitis) 09/16/2016  . Obstructive sleep apnea   . Osteomyelitis ([place])   . Peripheral vascular disease ([place])   . Sleep apnea   . Stroke ([place])   . Ulcers of both great toes ([place]) 11/07/2016   right great toe, started as finger nail size and grew in 6 days     Past Surgical History:  Procedure Laterality Date  . ABDOMINAL HYSTERECTOMY    . BACK SURGERY    . CESAREAN SECTION    . CHOLECYSTECTOMY    . HERNIA REPAIR    . JOINT REPLACEMENT    . KNEE SURGERY    . SPINE SURGERY    . TOE AMPUTATION    . TOE AMPUTATION Bilateral    3 on right foot 2 left foot   . TONSILLECTOMY    . TUBAL LIGATION      Family History  Problem Relation Age of Onset  . Cancer Mother        Thymoma, metastatic  . Heart disease Father   . Stroke Father   . Stroke Maternal Grandfather   . Heart disease Paternal Grandfather   . Multiple sclerosis Sister   . Epilepsy Sister   . Diabetes Neg Hx    Social History:  reports that she has never smoked. She has never used smokeless tobacco. She reports that she does not drink alcohol or use drugs.  Allergies:  Allergies  Allergen Reactions  . Furosemide Other (See Comments)   Dehydration leading to kidney failure  "my kidneys shutdown"  . Norvasc [Amlodipine Besylate] Swelling    "Severe edema"  . Prednisone Other (See Comments)    Ketoacidosis  . Vancomycin Other (See Comments)    Total organ shut down "It shut my kidneys down"  . Amlodipine Swelling    SWELLING/EDEMA REACTION UNSPECIFIED   . Clindamycin/Lincomycin Hives and Itching  . Doxycycline Hives and Rash  . Propranolol Cough    Bronchospasms  . Ibuprofen Other (See Comments)    WAS TOLD TO NOT TAKE THIS BECAUSE OF HER KIDNEYS AND LIVER  . Lisinopril Other (See Comments)    WAS TOLD TO NOT TAKE THIS BECAUSE OF HER KIDNEYS AND LIVER  . Lovastatin Other (See Comments)    WAS TOLD TO NOT TAKE THIS BECAUSE OF HER KIDNEYS AND LIVER  . Norvasc [Amlodipine Besylate] Other (See Comments)    Severe edema  . Nsaids Other (See Comments)    WAS TOLD TO NOT TAKE THIS BECAUSE OF HER KIDNEYS AND LIVER  . Sulfa Antibiotics Nausea Only    Medications Prior to Admission  Medication Sig Dispense Refill  . atorvastatin (  LIPITOR) 80 MG tablet Take 1 tablet (80 mg total) by mouth daily at 6 PM. 90 tablet 3  . BD ULTRA-FINE PEN NEEDLES 29G X 12.7MM MISC 2 packages with 100 in each package 2 each 0  . busPIRone (BUSPAR) 15 MG tablet Take 1 tablet (15 mg total) by mouth 3 (three) times daily. 90 tablet 3  . BYETTA 5 MCG PEN 5 MCG/0.02ML SOPN injection INJECT 0.02 MILLILITERS(5 MCG TOTAL) SUBCUTANEOUSLY TWICE A DAY WITH MEALS 1.2 mL 0  . carvedilol (COREG) 25 MG tablet Take 1 tablet (25 mg total) by mouth 2 (two) times daily. 180 tablet 1  . clopidogrel (PLAVIX) 75 MG tablet Take 1 tablet (75 mg total) by mouth daily. 90 tablet 3  . ezetimibe (ZETIA) 10 MG tablet Take 1 tablet (10 mg total) by mouth daily. 90 tablet 3  . gabapentin (NEURONTIN) 300 MG capsule Take 1 capsule (300 mg total) by mouth 3 (three) times daily. 90 capsule 3  . insulin aspart (NOVOLOG) 100 UNIT/ML FlexPen Inject 10 Units into the skin 3 (three)  times daily before meals. Take only if blood sugar is over 300 15 mL 3  . insulin NPH Human (NOVOLIN N) 100 UNIT/ML injection Inject 200 units each morning in skin. 4 vial 11  . INSULIN SYRINGE 1CC/29G (B-D INS SYR ULTRAFINE 1CC/29G) 29G X 1/2" 1 ML MISC Used to give insulin injections 4x daily. 200 each 11  . ranolazine (RANEXA) 1000 MG SR tablet Take 1 tablet (1,000 mg total) by mouth 2 (two) times daily. 180 tablet 3  . sertraline (ZOLOFT) 50 MG tablet Take 1 tablet (50 mg total) by mouth daily. 90 tablet 1  . Tapentadol HCl (NUCYNTA) 100 MG TABS Take 1 tablet by mouth 4 (four) times daily.    Marland Kitchen tiZANidine (ZANAFLEX) 4 MG tablet Take 4 mg by mouth 3 (three) times daily.      Results for orders placed or performed during the hospital encounter of 05/11/17 (from the past 48 hour(s))  Surgical pcr screen     Status: Abnormal   Collection Time: 05/11/17 10:16 PM  Result Value Ref Range   MRSA, PCR NEGATIVE NEGATIVE   Staphylococcus aureus POSITIVE (A) NEGATIVE    Comment: (NOTE) The Xpert SA Assay (FDA approved for NASAL specimens in patients 76 years of age and older), is one component of a comprehensive surveillance program. It is not intended to diagnose infection nor to guide or monitor treatment. Performed at [facility], [place] [address], [place], [place] [address]   Glucose, capillary     Status: Abnormal   Collection Time: 05/12/17  1:53 AM  Result Value Ref Range   Glucose-Capillary 256 (H) 65 - 99 mg/dL   No results found.  Review of Systems  All other systems reviewed and are negative.   Blood pressure (!) 102/59, pulse 68, temperature 98.3 F (36.8 C), temperature source Oral, resp. rate 19, height 5' 6"  (1.676 m), weight 231 lb 0.7 oz (104.8 kg), SpO2 94 %. Physical Exam   Patient is alert, oriented, no adenopathy, well-dressed, normal affect, normal respiratory effort. Patient has an antalgic gait she has a good dorsalis pedis pulse bilaterally.  There is  cellulitis of the left great toe with an ulcer.  After informed consent a 10 blade knife was used to debride the skin and soft tissue from the ulcer.  Silver nitrate was used for hemostasis.  The ulcer probes all the way down to bone consistent with osteomyelitis  with sausage digit swelling of the toe.  Examination of the right foot she has a large ulcer over the MTP joint of the great toe.  After informed consent a 10 blade knife was used to debride the skin and soft tissue back to bleeding viable granulation tissue.  This was touched with silver nitrate.  The ulcer is 3 cm in diameter and 3 mm deep.  The left great toe ulcer is 15 mm in diameter and 5 mm deep.   Assessment/Plan 1. Osteomyelitis of great toe of right foot ([place])   2. Osteomyelitis of great toe of left foot ([place])     Plan: Patient has osteomyelitis of the great toe bilaterally.  Discussed that her only option would be to proceed with surgical intervention for amputation of the great toe bilaterally.  Risks and benefits of surgery were discussed including risk of the wound breakdown need for higher level amputation.  Discussed the critical nature of her controlling her diabetes she is not to drink any sugary beverage or artificially sweetened beverage.  Discussed the importance of decreasing carbohydrates.  Patient states she understands wished to proceed at this time.     Newt Minion, MD 05/12/2017, 6:32 AM

## 2017-05-12 NOTE — Op Note (Signed)
05/11/2017 - 05/12/2017  11:40 AM  PATIENT:  [name]    PRE-OPERATIVE DIAGNOSIS:  Osteomyelitis Bilateral Great Toes  POST-OPERATIVE DIAGNOSIS:  Same  PROCEDURE:  BILATERAL GREAT TOE AMPUTATION Amputation of the left toe through the MTP joint.  First ray amputation on the right.  SURGEON:  Newt Minion, MD  PHYSICIAN ASSISTANT:None ANESTHESIA:   General  PREOPERATIVE INDICATIONS:  [name] is a  62 y.o. female with a diagnosis of Osteomyelitis Bilateral Great Toes who failed conservative measures and elected for surgical management.    The risks benefits and alternatives were discussed with the patient preoperatively including but not limited to the risks of infection, bleeding, nerve injury, cardiopulmonary complications, the need for revision surgery, among others, and the patient was willing to proceed.  OPERATIVE IMPLANTS: None.  @ENCIMAGES @  OPERATIVE FINDINGS: No deep abscess.  OPERATIVE PROCEDURE: Patient was brought to the operating room and underwent a general anesthetic.  After adequate levels of anesthesia were obtained patient's both lower extremities were prepped using DuraPrep draped in the sterile field a timeout was called.  A fishmouth incision was made just distal to the MTP joint of the left foot.  The left great toe was amputated through the MTP joint.  There is no abscess no signs of infection of the amputation site.  The wound was irrigated with normal saline electrocautery was used for hemostasis and the incision was closed using 2-0 nylon.  Attention was then focused on the right foot.  A racquet incision was made to incorporate the ulcer beneath the MTP joint and the first ray was with resected through the midshaft of the first metatarsal.  The toe infected MTP joint were resected in one block of tissue there is no deep abscess no signs of infection at the amputation level.  Wound was irrigated with normal saline electrocautery was used for hemostasis  the incision was closed using 2-0 nylon.  Sterile dressings were applied for both lower extremities.  Patient was extubated taken the PACU in stable condition.   DISCHARGE PLANNING:  Antibiotic duration: 24 hours postoperatively  Weightbearing: Patient may be weightbearing for transfer training but she cannot be gait training.  Pain medication: Current pain medication ordered  Dressing care/ Wound VAC: Dressing to remain intact and dry for 1 week  Ambulatory devices: Transfers only.  Discharge to: Anticipate discharge to skilled nursing.  Follow-up: In the office 1 week post operative.

## 2017-05-12 NOTE — Anesthesia Procedure Notes (Signed)
Procedure Name: LMA Insertion Date/Time: 05/12/2017 11:08 AM Performed by: Freddie Breech, CRNA Pre-anesthesia Checklist: Patient identified, Emergency Drugs available, Suction available and Patient being monitored Patient Re-evaluated:Patient Re-evaluated prior to induction Oxygen Delivery Method: Circle System Utilized Preoxygenation: Pre-oxygenation with 100% oxygen Induction Type: IV induction Ventilation: Mask ventilation without difficulty LMA: LMA with gastric port inserted LMA Size: 4.0 Number of attempts: 1 Airway Equipment and Method: Bite block Placement Confirmation: positive ETCO2 Tube secured with: Tape Dental Injury: Teeth and Oropharynx as per pre-operative assessment

## 2017-05-12 NOTE — Care Management Note (Addendum)
Case Management Note  Patient Details  Name: [name] MRN: [id number] Date of Birth: 10/24/1955  Subjective/Objective:  History of DM type II withRand Ldiabetic foot ulcers,peripheral neuropathy,HTN, CKD stage 3, NASH, OSA w/ CPAP, H/o CVA with residual left-sided weakness, TIAs.  Admitted for Osteomyelitis of the bilateral great toes, diabetic foot ulcers:  -Status post surgical intervention by Dr. Sharol Given on 4/10.    Action/Plan: In to speak with patient, daughter at bedside.  Permission given to speak in front of daughter given by patient.  Prior to admission patient lived at home. At discharge, patient has transportation home.  Patient has the ability to pay for  medications and food. PCP verified.  Prior to admission patient was active with [facility]: PT/OT only.  If still requires [facility] services would like to remain with [facility].    Expected Discharge Date:  05/14/17               Expected Discharge Plan:   SNF  In-House Referral:  Clinical Social Work  Discharge planning Services  CM Consult  Post Acute Care Choice:  Home Health Choice offered to:  Patient, Adult Children  DME Arranged:    DME Agency:     HH Arranged:    [place] Agency:  [facility]  Status of Service:  In process, will continue to follow  If discussed at Long Length of Stay Meetings, dates discussed:    Additional Comments:  Kristen Cardinal, RN  Nurse case manager Home Health 05/12/2017, 3:21 PM

## 2017-05-13 ENCOUNTER — Telehealth: Payer: Self-pay | Admitting: Family Medicine

## 2017-05-13 ENCOUNTER — Encounter (HOSPITAL_COMMUNITY): Payer: Self-pay | Admitting: Orthopedic Surgery

## 2017-05-13 DIAGNOSIS — I10 Essential (primary) hypertension: Secondary | ICD-10-CM

## 2017-05-13 LAB — BASIC METABOLIC PANEL
Anion gap: 8 (ref 5–15)
BUN: 34 mg/dL — AB (ref 6–20)
CHLORIDE: 105 mmol/L (ref 101–111)
CO2: 24 mmol/L (ref 22–32)
CREATININE: 1.79 mg/dL — AB (ref 0.44–1.00)
Calcium: 7.9 mg/dL — ABNORMAL LOW (ref 8.9–10.3)
GFR calc Af Amer: 34 mL/min — ABNORMAL LOW (ref >60)
GFR calc non Af Amer: 29 mL/min — ABNORMAL LOW (ref >60)
Glucose, Bld: 134 mg/dL — ABNORMAL HIGH (ref 65–99)
POTASSIUM: 4.7 mmol/L (ref 3.5–5.1)
SODIUM: 137 mmol/L (ref 135–145)

## 2017-05-13 LAB — URINALYSIS, ROUTINE W REFLEX MICROSCOPIC
BILIRUBIN URINE: NEGATIVE
GLUCOSE, UA: NEGATIVE mg/dL
Ketones, ur: NEGATIVE mg/dL
NITRITE: NEGATIVE
Protein, ur: NEGATIVE mg/dL
SPECIFIC GRAVITY, URINE: 1.01 (ref 1.005–1.030)
pH: 6 (ref 5.0–8.0)

## 2017-05-13 LAB — CBC WITH DIFFERENTIAL/PLATELET
Basophils Absolute: 0 10*3/uL (ref 0.0–0.1)
Basophils Relative: 0 %
EOS PCT: 2 %
Eosinophils Absolute: 0.1 10*3/uL (ref 0.0–0.7)
HEMATOCRIT: 33.1 % — AB (ref 36.0–46.0)
Hemoglobin: 10.6 g/dL — ABNORMAL LOW (ref 12.0–15.0)
LYMPHS PCT: 20 %
Lymphs Abs: 1.1 10*3/uL (ref 0.7–4.0)
MCH: 27.4 pg (ref 26.0–34.0)
MCHC: 32 g/dL (ref 30.0–36.0)
MCV: 85.5 fL (ref 78.0–100.0)
MONO ABS: 0.4 10*3/uL (ref 0.1–1.0)
MONOS PCT: 7 %
NEUTROS ABS: 3.8 10*3/uL (ref 1.7–7.7)
Neutrophils Relative %: 71 %
PLATELETS: 205 10*3/uL (ref 150–400)
RBC: 3.87 MIL/uL (ref 3.87–5.11)
RDW: 14.3 % (ref 11.5–15.5)
WBC: 5.4 10*3/uL (ref 4.0–10.5)

## 2017-05-13 LAB — GLUCOSE, CAPILLARY
Glucose-Capillary: 158 mg/dL — ABNORMAL HIGH (ref 65–99)
Glucose-Capillary: 143 mg/dL — ABNORMAL HIGH (ref 65–99)
Glucose-Capillary: 294 mg/dL — ABNORMAL HIGH (ref 65–99)
Glucose-Capillary: 195 mg/dL — ABNORMAL HIGH (ref 65–99)

## 2017-05-13 LAB — MAGNESIUM: Magnesium: 1.9 mg/dL (ref 1.7–2.4)

## 2017-05-13 MED ORDER — SODIUM CHLORIDE 0.9 % IV SOLN
1.0000 g | INTRAVENOUS | Status: DC
  Administered 2017-05-13 – 2017-05-14 (×2): 1 g via INTRAVENOUS
  Filled 2017-05-13 (×3): qty 10

## 2017-05-13 NOTE — Clinical Social Work Note (Signed)
Clinical Social Work Assessment  Patient Details  Name: [name] MRN: [id number] Date of Birth: 1955-11-20  Date of referral:  05/13/17               Reason for consult:  Discharge Planning                Permission sought to share information with:  Family Supports, Customer service manager Permission granted to share information::  Yes, Verbal Permission Granted  Name::     [name]  Agency::     Relationship::  Daughter  Contact Information:  [phone]  Housing/Transportation Living arrangements for the past 2 months:  [place] of Information:  Patient Patient Interpreter Needed:    Criminal Activity/Legal Involvement Pertinent to Current Situation/Hospitalization:  No - Comment as needed Significant Relationships:  Adult Children Lives with:  Adult Children Do you feel safe going back to the place where you live?  Yes(Patient wants to go home but daughter feels snf for short term rehab is better.) Need for family participation in patient care:  Yes (Comment)(Continue support of going to snf for short term rehab.)  Care giving concerns: The patient expressed that she did not want to go to a SNF for short term rehab, however the patient's daughter Ms. [name] is concerned about her mother receiving the appropriate care she needs.    Social Worker assessment / plan:  CSW intern met with patient at bedside. Also at the bedside was [name], daughter of patient. [name] was alert, oriented, and was sitting up in bed slightly, however she was having difficulty staying awake during the conversation. [name] informed [place] Intern that patient has been living with her for the last 3 years and when her mother received home health services, she was not compliant with physical therapy activities. During the conversation about facility options [name] began to cry stating "I really do not want to go" CSW Intern actively listened to Ms.  [name] and expressed empathic understanding regarding her feelings. CSW Intern explained to patient that SNF placement is only for short term rehab and then she could return home. [name] expressed understanding. [name] had questions that CSW Intern was not able to answer and CSW stepped in to assist. [name] expressed understanding regarding her need for short term rehab and was agreeable.  Employment status:  Retired Nurse, adult PT Recommendations:  Not assessed at this time Information / Referral to community resources:     Patient/Family's Response to care:  Patient expressed no concerns regarding care during hospitalization.   Patient/Family's Understanding of and Emotional Response to Diagnosis, Current Treatment, and Prognosis: [name] is understanding of her need for short term rehab and has agreed to receive rehab services at a skilled nursing facility.   Emotional Assessment Appearance:  Appears Older Attitude/Demeanor/Rapport:  Apprehensive, Crying Affect (typically observed):  Tearful/Crying, Accepting, Calm, Pleasant Orientation:  Oriented to Self, Oriented to Place, Oriented to  Time, Oriented to Situation Alcohol / Substance use:  Never Used Psych involvement (Current and /or in the community):  No (Comment)  Discharge Needs  Concerns to be addressed:  No discharge needs identified Readmission within the last 30 days:    Current discharge risk:  None Barriers to Discharge:  No Barriers Identified   [name], Student-Social Work 05/13/2017, 11:21 AM

## 2017-05-13 NOTE — Progress Notes (Signed)
Pt states that she does not wear her CPAP at home and has declined to use the hospitals CPAP while here.

## 2017-05-13 NOTE — Progress Notes (Signed)
Patient ID: [name], female   DOB: [birth year], 62 y.o.   MRN: [id number]  PROGRESS NOTE    Zinia Deckman  WUJ:[id number] DOB: 06-17-1955 DOA: 05/11/2017 PCP: Forrest Moron, MD   Brief Narrative:  62 y.o. female with medical history significant of DM type II withRand Ldiabetic foot ulcers,peripheral neuropathy,HTN, CKD stage 3, NASH, OSA w/ CPAP, H/o CVA  with residual left-sided weakness, TIAs, chronic pain, and anxiety who presented to [facility] with altered mental status.  Patient was transferred to [facility] for surgical intervention by orthopedics for osteomyelitis of bilateral toes.  She had surgical intervention by orthopedics on 05/12/2017.   Assessment & Plan:   Principal Problem:   Acute encephalopathy Active Problems:   Essential hypertension   OSA on CPAP   Diabetes (HCC)   Chronic pain syndrome   Osteomyelitis of great toe of right foot (HCC)   Osteomyelitis of great toe of left foot (HCC)   Acute probably metabolic encephalopathy:  -CT scan of the brain was negative for any acute abnormalities.    Patient is awake this morning but was intermittently confused last night -Monitor mental status  Delirium -Monitor mental status.  Patient complains of burning in the urine.  Will send urinalysis with culture.  Hold off on antibiotics for now. -Patient is on chronic pain meds and other sedative medications which can make things worse.  Osteomyelitis of the bilateral great toes, diabetic foot ulcers:  -Status post surgical intervention on 05/12/2017 orthopedics.  Follow further recommendations from orthopedics.  Wound care as per orthopedics. -PT evaluation.  Probably need nursing home placement.  Social worker consulted  Acute kidney injury on chronic kidney disease: Patient baseline creatinine previously noted to be around 1.3 presented to the outside hospital with a creatinine of 2.18 and BUN 37.  Suspect prerenal cause of symptoms  related with uncontrolled diabetes.  -Improving.  Discontinue IV fluids.  Encourage oral intake.  Repeat a.m. labs  DM type II: Last hemoglobin A1c noted to be 12.9 on 03/26/2017. -NPH has been discontinued.  Diabetes coordinator following. -Continue Lantus 20 units nightly.  Continue sliding scale insulin coverage   chronic pain syndrome: Patient followed by Dr. Mirna Mires of [facility].  Recent change in medications from Nucentya to MS Contin - Continue home pain medication regimen - If symptoms recur may warrant reduction in pain medication   Peripheral neuropathy - Continue dose adjusted gabapentin  Anxiety: stable - Continue Zoloft and BuSpar  OSA on CPAP -Continue CPAP  DVT prophylaxis: Lovenox Code Status: Full Family Communication: Daughter at bedside Disposition Plan: Probable nursing home in the next few days  Consultants: Orthopedics  Procedures:  Amputation of the left toe through the MTP joint.  First ray amputation on the right on 05/12/2017  Antimicrobials: Perioperative   Subjective: Patient seen and examined at bedside.  Patient is awake this morning and gets intermittently agitated.  Spoke to the daughter at bedside, patient apparently had a rough night last night with intermittent confusion.  No overnight fever or vomiting.  Patient complains of increased frequency of urination and burning. Objective: Vitals:   05/12/17 1237 05/12/17 1614 05/12/17 2123 05/13/17 0454  BP: (!) 107/47 (!) 101/52 (!) 145/68 (!) 110/57  Pulse: 61 60 79 77  Resp: 18 16 18 16   Temp: 97.8 F (36.6 C) 98.2 F (36.8 C) 98.8 F (37.1 C) 98.5 F (36.9 C)  TempSrc: Oral Oral Oral Oral  SpO2: 97% 94% 96% 95%  Weight:   104.8 kg (231 lb 0.7 oz)   Height:        Intake/Output Summary (Last 24 hours) at 05/13/2017 0913 Last data filed at 05/13/2017 0913 Gross per 24 hour  Intake 2999.08 ml  Output 10 ml  Net 2989.08 ml   Filed Weights   05/11/17 2208  05/12/17 2123  Weight: 104.8 kg (231 lb 0.7 oz) 104.8 kg (231 lb 0.7 oz)    Examination:  General exam: Appears older than stated age.  Awake but gets intermittently agitated Respiratory system: Bilateral decreased breath sounds at bases Cardiovascular system: S1 & S2 heard, rate controlled  gastrointestinal system: Abdomen is nondistended, soft and nontender. Normal bowel sounds heard. Central nervous system: Awake but a poor historian.  No focal neurological deficits. Moving extremities Extremities: No cyanosis; trace edema; bilateral lower extremity dressing present Skin: No other rashes, lesions or ulcers Psychiatry: Anxious    Data Reviewed: I have personally reviewed following labs and imaging studies  CBC: Recent Labs  Lab 05/12/17 0715 05/13/17 0408  WBC 5.3 5.4  NEUTROABS 3.7 3.8  HGB 10.4* 10.6*  HCT 32.3* 33.1*  MCV 85.2 85.5  PLT 179 086   Basic Metabolic Panel: Recent Labs  Lab 05/12/17 0715 05/13/17 0408  NA 133* 137  K 4.8 4.7  CL 101 105  CO2 22 24  GLUCOSE 287* 134*  BUN 45* 34*  CREATININE 2.08* 1.79*  CALCIUM 7.9* 7.9*  MG  --  1.9   GFR: Estimated Creatinine Clearance: 39.9 mL/min (A) (by C-G formula based on SCr of 1.79 mg/dL (H)). Liver Function Tests: No results for input(s): AST, ALT, ALKPHOS, BILITOT, PROT, ALBUMIN in the last 168 hours. No results for input(s): LIPASE, AMYLASE in the last 168 hours. No results for input(s): AMMONIA in the last 168 hours. Coagulation Profile: Recent Labs  Lab 05/12/17 0715  INR 1.08   Cardiac Enzymes: No results for input(s): CKTOTAL, CKMB, CKMBINDEX, TROPONINI in the last 168 hours. BNP (last 3 results) No results for input(s): PROBNP in the last 8760 hours. HbA1C: No results for input(s): HGBA1C in the last 72 hours. CBG: Recent Labs  Lab 05/12/17 1149 05/12/17 1235 05/12/17 1743 05/12/17 2123 05/13/17 0730  GLUCAP 281* 251* 222* 186* 158*   Lipid Profile: No results for input(s):  CHOL, HDL, LDLCALC, TRIG, CHOLHDL, LDLDIRECT in the last 72 hours. Thyroid Function Tests: No results for input(s): TSH, T4TOTAL, FREET4, T3FREE, THYROIDAB in the last 72 hours. Anemia Panel: No results for input(s): VITAMINB12, FOLATE, FERRITIN, TIBC, IRON, RETICCTPCT in the last 72 hours. Sepsis Labs: No results for input(s): PROCALCITON, LATICACIDVEN in the last 168 hours.  Recent Results (from the past 240 hour(s))  Surgical pcr screen     Status: Abnormal   Collection Time: 05/11/17 10:16 PM  Result Value Ref Range Status   MRSA, PCR NEGATIVE NEGATIVE Final   Staphylococcus aureus POSITIVE (A) NEGATIVE Final    Comment: (NOTE) The Xpert SA Assay (FDA approved for NASAL specimens in patients 48 years of age and older), is one component of a comprehensive surveillance program. It is not intended to diagnose infection nor to guide or monitor treatment. Performed at [facility], [place] [address] [place],  [address]          Radiology Studies: No results found.      Scheduled Meds: . atorvastatin  80 mg Oral q1800  . busPIRone  15 mg Oral TID  . carvedilol  25 mg Oral BID  WC  . Chlorhexidine Gluconate Cloth  6 each Topical Daily  . docusate sodium  100 mg Oral BID  . enoxaparin (LOVENOX) injection  40 mg Subcutaneous Q24H  . ezetimibe  10 mg Oral Daily  . gabapentin  300 mg Oral BID  . insulin aspart  0-5 Units Subcutaneous QHS  . insulin aspart  0-9 Units Subcutaneous TID WC  . insulin glargine  20 Units Subcutaneous QHS  . morphine  30 mg Oral Q8H  . mupirocin ointment  1 application Nasal BID  . ranolazine  500 mg Oral BID  . sertraline  50 mg Oral Daily   Continuous Infusions: . sodium chloride 75 mL/hr at 05/13/17 0815  . sodium chloride    .  ceFAZolin (ANCEF) IV Stopped (05/13/17 0225)  . lactated ringers 10 mL/hr at 05/12/17 1010  . methocarbamol (ROBAXIN)  IV       LOS: 2 days        Aline August, MD [facility] Pager  [phone]  If 7PM-7AM, please contact night-coverage [email or web address] Password [facility] 05/13/2017, 9:13 AM

## 2017-05-13 NOTE — Evaluation (Signed)
Occupational Therapy Evaluation Patient Details Name: [name] MRN: [id number] DOB: 16-Sep-1955 Today's Date: 05/13/2017    History of Present Illness Pt admitted with AMS 05/11/17, + B great toe osteomyelitis, no s/p amputations 05/12/17. PMH: R and L toe amputations 2/2 diabetic foot ulcers, peripheral neuropathy, DM, HTN, NASH, CKD, CVA with L residual deficits, OSA.    Clinical Impression   Pt was independent prior to admission. She was lethargic during session, closing her eyes when not stimulated. Pt requires moderate assistance to perform transfers and max assist for LB bathing, dressing and toileting. Pt will need post acute OT in SNF prior to return home. Will follow.    Follow Up Recommendations  SNF;Supervision/Assistance - 24 hour    Equipment Recommendations  Wheelchair (measurements OT);Wheelchair cushion (measurements OT)    Recommendations for Other Services       Precautions / Restrictions Precautions Precautions: Fall Restrictions Weight Bearing Restrictions: Yes RLE Weight Bearing: (for transfers only) LLE Weight Bearing: (for transfers only)      Mobility Bed Mobility Overal bed mobility: Needs Assistance Bed Mobility: Supine to Sit     Supine to sit: Supervision     General bed mobility comments: HOB up, use of rail, increased time  Transfers Overall transfer level: Needs assistance Equipment used: Rolling walker (2 wheeled) Transfers: Sit to/from [facility] Sit to Stand: Min assist Stand pivot transfers: Min assist       General transfer comment: assist to rise and steady from bed and 3 in 1    Balance Overall balance assessment: Needs assistance   Sitting balance-Leahy Scale: Fair       Standing balance-Leahy Scale: Poor                             ADL either performed or assessed with clinical judgement   ADL Overall ADL's : Needs assistance/impaired Eating/Feeding: Independent;Sitting    Grooming: Set up;Sitting   Upper Body Bathing: Sitting;Supervision/ safety   Lower Body Bathing: Maximal assistance;Sit to/from stand   Upper Body Dressing : Sitting;Supervision/safety   Lower Body Dressing: Maximal assistance;Sit to/from stand   Toilet Transfer: Minimal assistance;Stand-pivot;RW;BSC   Toileting- Clothing Manipulation and Hygiene: Maximal assistance;Sit to/from stand               Vision Patient Visual Report: No change from baseline       Perception     Praxis      Pertinent Vitals/Pain Pain Assessment: Faces Faces Pain Scale: Hurts little more Pain Location: back, feet Pain Descriptors / Indicators: Grimacing;Guarding;Discomfort Pain Intervention(s): Monitored during session;Premedicated before session;Repositioned     Hand Dominance Right   Extremity/Trunk Assessment Upper Extremity Assessment Upper Extremity Assessment: Generalized weakness   Lower Extremity Assessment Lower Extremity Assessment: Defer to PT evaluation       Communication Communication Communication: No difficulties   Cognition Arousal/Alertness: Lethargic Behavior During Therapy: WFL for tasks assessed/performed Overall Cognitive Status: Impaired/Different from baseline Area of Impairment: Following commands;Problem solving                       Following Commands: Follows one step commands with increased time     Problem Solving: Slow processing;Decreased initiation;Difficulty sequencing;Requires verbal cues General Comments: due to lethargy   General Comments       Exercises     Shoulder Instructions      Home Living Family/patient expects to be discharged to:: Private residence Living  Arrangements: Children Available Help at Discharge: Family;Available PRN/intermittently(daughter works part time) Type of Home: House Home Access: Ramped entrance     [place]: One level     Bathroom Shower/Tub: Occupational psychologist:  Handicapped height Bathroom Accessibility: Yes How Accessible: Accessible via walker;Accessible via wheelchair Home Equipment: [place] - 4 wheels;Cane - single point;Shower seat;Grab bars - tub/shower          Prior Functioning/Environment Level of Independence: Independent with assistive device(s)        Comments: walks with a cane or walker, sits to shower, daughter does cooking and cleaning        OT Problem List: Impaired balance (sitting and/or standing);Decreased activity tolerance;Decreased cognition;Decreased knowledge of use of DME or AE;Pain      OT Treatment/Interventions: Self-care/ADL training;DME and/or AE instruction;Patient/family education;Balance training;Cognitive remediation/compensation;Therapeutic activities    OT Goals(Current goals can be found in the care plan section) Acute Rehab OT Goals Patient Stated Goal: to get better OT Goal Formulation: With patient Time For Goal Achievement: 05/27/17 Potential to Achieve Goals: Good ADL Goals Pt Will Perform Lower Body Bathing: with modified independence;sit to/from stand Pt Will Perform Lower Body Dressing: with modified independence;sit to/from stand Pt Will Transfer to Toilet: with modified independence;stand pivot transfer;bedside commode Pt Will Perform Toileting - Clothing Manipulation and hygiene: with modified independence;sit to/from stand  OT Frequency: Min 2X/week   Barriers to D/C:            Co-evaluation PT/OT/SLP Co-Evaluation/Treatment: Yes Reason for Co-Treatment: For patient/therapist safety   OT goals addressed during session: ADL's and self-care      AM-PAC PT "6 Clicks" Daily Activity     Outcome Measure Help from another person eating meals?: None Help from another person taking care of personal grooming?: None Help from another person toileting, which includes using toliet, bedpan, or urinal?: A Lot Help from another person bathing (including washing, rinsing, drying)?: A  Lot Help from another person to put on and taking off regular upper body clothing?: None Help from another person to put on and taking off regular lower body clothing?: A Lot 6 Click Score: 18   End of Session Equipment Utilized During Treatment: Gait belt;Rolling walker  Activity Tolerance: Patient tolerated treatment well Patient left: in chair;with call bell/phone within reach;with family/visitor present  OT Visit Diagnosis: Unsteadiness on feet (R26.81);Pain;Other symptoms and signs involving cognitive function                Time: 5093-2671 OT Time Calculation (min): 30 min Charges:  OT General Charges $OT Visit: 1 Visit OT Evaluation $OT Eval Moderate Complexity: 1 Mod G-Codes:     Malka So 05/13/2017, 1:12 PM  05/13/2017 Nestor Lewandowsky, OTR/L Pager: [phone]

## 2017-05-13 NOTE — Evaluation (Signed)
Physical Therapy Evaluation Patient Details Name: [name] MRN: [id number] DOB: 24-Mar-1955 Today's Date: 05/13/2017   History of Present Illness  Pt admitted with AMS 05/11/17, + B great toe osteomyelitis, no s/p amputations 05/12/17. PMH: R and L toe amputations 2/2 diabetic foot ulcers, peripheral neuropathy, DM, HTN, NASH, CKD, CVA with L residual deficits, OSA.   Clinical Impression  Patient presents with decreased mobility due to LE pain and limited weight bearing/tolerance.  She will benefit from skilled PT in the acute setting to allow return home following SNF level rehab stay.  Previously she was independent with RW, currently min/mod A for transfers only with RW.     Follow Up Recommendations SNF    Equipment Recommendations  Wheelchair (measurements PT);Wheelchair cushion (measurements PT)    Recommendations for Other Services       Precautions / Restrictions Precautions Precautions: Fall Restrictions RLE Weight Bearing: (for transfers only) LLE Weight Bearing: (for transfers only)      Mobility  Bed Mobility Overal bed mobility: Needs Assistance Bed Mobility: Supine to Sit     Supine to sit: Supervision     General bed mobility comments: HOB up, use of rail, increased time  Transfers Overall transfer level: Needs assistance Equipment used: Rolling walker (2 wheeled) Transfers: Sit to/from [facility] Sit to Stand: Mod assist Stand pivot transfers: Min assist       General transfer comment: assist to rise and steady from bed and 3 in 1  Ambulation/Gait                Stairs            Wheelchair Mobility    Modified Rankin (Stroke Patients Only)       Balance Overall balance assessment: Needs assistance   Sitting balance-Leahy Scale: Fair       Standing balance-Leahy Scale: Poor                               Pertinent Vitals/Pain Pain Assessment: Faces Pain Score: 5  Pain Location: back,  feet Pain Descriptors / Indicators: Grimacing;Guarding;Discomfort Pain Intervention(s): Repositioned;Monitored during session    Home Living Family/patient expects to be discharged to:: Skilled nursing facility Living Arrangements: Children Available Help at Discharge: Family;Available PRN/intermittently(daughter works part time) Type of Home: House Home Access: [place]: One [place]: Environmental consultant - 4 wheels;Cane - single point;Shower seat;Grab bars - tub/shower      Prior Function Level of Independence: Independent with assistive device(s)         Comments: walks with a cane or walker, sits to shower, daughter does cooking and cleaning     Hand Dominance   Dominant Hand: Right    Extremity/Trunk Assessment   Upper Extremity Assessment Upper Extremity Assessment: Defer to OT evaluation    Lower Extremity Assessment Lower Extremity Assessment: RLE deficits/detail;LLE deficits/detail RLE Deficits / Details: both feet wrapped with Coban and gauze, R foot daughter reports with no toes but the pinky toe, moves antigravity and has strength for standing, not formally tested  LLE Deficits / Details: both feet wrapped with [place] and gauze, R foot daughter reports has only couple of toes left on this foot, moves antigravity and has strength for standing, not formally tested        Communication   Communication: No difficulties  Cognition Arousal/Alertness: Lethargic Behavior During Therapy: WFL for tasks assessed/performed Overall Cognitive  Status: Impaired/Different from baseline Area of Impairment: Following commands;Problem solving                       Following Commands: Follows one step commands with increased time     Problem Solving: Slow processing;Decreased initiation;Difficulty sequencing;Requires verbal cues General Comments: due to lethargy      General Comments      Exercises     Assessment/Plan    PT  Assessment Patient needs continued PT services  PT Problem List Decreased strength;Decreased mobility;Decreased safety awareness;Decreased activity tolerance;Decreased balance;Decreased knowledge of use of DME;Pain       PT Treatment Interventions DME instruction;Therapeutic activities;Therapeutic exercise;Patient/family education;Stair training;Balance training;Functional mobility training    PT Goals (Current goals can be found in the Care Plan section)  Acute Rehab PT Goals Patient Stated Goal: to get better PT Goal Formulation: With patient Time For Goal Achievement: 05/20/17 Potential to Achieve Goals: Good    Frequency Min 3X/week   Barriers to discharge        Co-evaluation PT/OT/SLP Co-Evaluation/Treatment: Yes Reason for Co-Treatment: For patient/therapist safety PT goals addressed during session: Balance;Mobility/safety with mobility         AM-PAC PT "6 Clicks" Daily Activity  Outcome Measure Difficulty turning over in bed (including adjusting bedclothes, sheets and blankets)?: A Lot Difficulty moving from lying on back to sitting on the side of the bed? : Unable Difficulty sitting down on and standing up from a chair with arms (e.g., wheelchair, bedside commode, etc,.)?: Unable Help needed moving to and from a bed to chair (including a wheelchair)?: A Lot Help needed walking in hospital room?: Total Help needed climbing 3-5 steps with a railing? : Total 6 Click Score: 8    End of Session Equipment Utilized During Treatment: Gait belt Activity Tolerance: Patient limited by lethargy Patient left: with call bell/phone within reach;in chair;with family/visitor present Nurse Communication: Mobility status PT Visit Diagnosis: Other abnormalities of gait and mobility (R26.89);Muscle weakness (generalized) (M62.81);Difficulty in walking, not elsewhere classified (R26.2)    Time: 6468-0321 PT Time Calculation (min) (ACUTE ONLY): 26 min   Charges:   PT  Evaluation $PT Eval Moderate Complexity: 1 Mod     PT G CodesMagda Kiel, [place] [phone] 05/13/2017   Reginia Naas 05/13/2017, 4:10 PM

## 2017-05-13 NOTE — Telephone Encounter (Signed)
Called pt to try and reschedule her appt with Dr. Nolon Rod that she has on 07/26/17. When pt calls back, please reschedule her with Nolon Rod at her convenience.    Notes: chronic medical problems

## 2017-05-13 NOTE — Progress Notes (Signed)
Patient ID: [name], female   DOB: 1955-05-11, 62 y.o.   MRN: [id number] Postoperative day 1 left great toe amputation and right first ray amputation.  Patient states that she is sore patient's daughter states that the patient has not herself.  Patient states that she is fine.  Patient will need discharge to skilled nursing transfers only no gait training weightbearing as needed for transfers.  I will follow-up in 1 week after discharge.  Dressings to remain clean dry and intact.

## 2017-05-14 ENCOUNTER — Ambulatory Visit: Payer: Medicare HMO | Admitting: Cardiology

## 2017-05-14 DIAGNOSIS — N390 Urinary tract infection, site not specified: Secondary | ICD-10-CM

## 2017-05-14 LAB — GLUCOSE, CAPILLARY
GLUCOSE-CAPILLARY: 224 mg/dL — AB (ref 65–99)
Glucose-Capillary: 167 mg/dL — ABNORMAL HIGH (ref 65–99)
Glucose-Capillary: 215 mg/dL — ABNORMAL HIGH (ref 65–99)
Glucose-Capillary: 271 mg/dL — ABNORMAL HIGH (ref 65–99)

## 2017-05-14 MED ORDER — METHOCARBAMOL 1000 MG/10ML IJ SOLN
500.0000 mg | Freq: Four times a day (QID) | INTRAVENOUS | Status: DC | PRN
  Filled 2017-05-14: qty 5

## 2017-05-14 MED ORDER — METHOCARBAMOL 500 MG PO TABS
750.0000 mg | ORAL_TABLET | Freq: Four times a day (QID) | ORAL | Status: DC | PRN
  Administered 2017-05-14 – 2017-05-15 (×2): 750 mg via ORAL
  Filled 2017-05-14 (×2): qty 2

## 2017-05-14 MED ORDER — INSULIN GLARGINE 100 UNIT/ML ~~LOC~~ SOLN
25.0000 [IU] | Freq: Every day | SUBCUTANEOUS | Status: DC
  Administered 2017-05-14: 25 [IU] via SUBCUTANEOUS
  Filled 2017-05-14 (×2): qty 0.25

## 2017-05-14 NOTE — Progress Notes (Signed)
Patient ID: [name], female   DOB: 06/18/1955, 62 y.o.   MRN: [id number]  PROGRESS NOTE    [name]  ATF:[id number] DOB: 04/11/1955 DOA: 05/11/2017 PCP: Forrest Moron, MD   Brief Narrative:  62 y.o. female with medical history significant of DM type II withRand Ldiabetic foot ulcers,peripheral neuropathy,HTN, CKD stage 3, NASH, OSA w/ CPAP, H/o CVA  with residual left-sided weakness, TIAs, chronic pain, and anxiety who presented to [facility] with altered mental status.  Patient was transferred to [facility] for surgical intervention by orthopedics for osteomyelitis of bilateral toes.  She had surgical intervention by orthopedics on 05/12/2017.   Assessment & Plan:   Principal Problem:   Acute encephalopathy Active Problems:   Essential hypertension   OSA on CPAP   Diabetes (HCC)   Chronic pain syndrome   Osteomyelitis of great toe of right foot (HCC)   Osteomyelitis of great toe of left foot (HCC)   Acute probably metabolic encephalopathy:  -CT scan of the brain was negative for any acute abnormalities.     -Patient is more awake this morning and answering questions -Monitor mental status  Delirium -Improving.  Patient was started on Rocephin for UTI yesterday. -Patient is on chronic pain meds and other sedative medications which can make things worse.  Probable UTI, probably present on admission -Continue Rocephin.  Follow cultures.  Osteomyelitis of the bilateral great toes, diabetic foot ulcers:  -Status post surgical intervention on 05/12/2017 orthopedics.  Follow further recommendations from orthopedics.  Wound care as per orthopedics. -PT recommends nursing home placement.  Social worker consulted  Acute kidney injury on chronic kidney disease: Patient baseline creatinine previously noted to be around 1.3 presented to the outside hospital with a creatinine of 2.18 and BUN 37.  Suspect prerenal cause of symptoms related with  uncontrolled diabetes.  -Improving.   -No labs today.  Will repeat a.m. labs.  Encourage oral intake  DM type II: Last hemoglobin A1c noted to be 12.9 on 03/26/2017. -NPH has been discontinued.  Diabetes coordinator following. -With persistent hyperglycemia -Increase Lantus to 25 units nightly.  Continue sliding scale insulin coverage   chronic pain syndrome: Patient followed by Dr. Mirna Mires of [facility].  Recent change in medications from Nucentya to MS Contin - Continue home pain medication regimen - If symptoms recur may warrant reduction in pain medication  -Outpatient follow-up with pain management  Peripheral neuropathy - Continue dose adjusted gabapentin  Anxiety: stable - Continue Zoloft and BuSpar  OSA on CPAP -Continue CPAP  DVT prophylaxis: Lovenox Code Status: Full Family Communication: None at bedside Disposition Plan: Probable nursing home in 1-2 days  Consultants: Orthopedics  Procedures:  Amputation of the left toe through the MTP joint.  First ray amputation on the right on 05/12/2017  Antimicrobials: Perioperative Rocephin from 05/13/2017 onwards   Subjective: Patient seen and examined at bedside.  She is awake this morning and more calm.  She answers some questions.  Feels that her  burning of her urine is better  objective: Vitals:   05/13/17 0913 05/13/17 2036 05/14/17 0440 05/14/17 0953  BP: (!) 151/66 121/66 118/68 (!) 112/54  Pulse: 79 68 65 73  Resp:  16 16 18   Temp: 99.2 F (37.3 C) 98.8 F (37.1 C) 98.6 F (37 C) 97.7 F (36.5 C)  TempSrc: Oral Oral  Oral  SpO2: 95% 92% 92% (!) 89%  Weight:  104.8 kg (231 lb 0.7 oz)    Height:  Intake/Output Summary (Last 24 hours) at 05/14/2017 1204 Last data filed at 05/14/2017 1202 Gross per 24 hour  Intake 780 ml  Output 1800 ml  Net -1020 ml   Filed Weights   05/11/17 2208 05/12/17 2123 05/13/17 2036  Weight: 104.8 kg (231 lb 0.7 oz) 104.8 kg (231 lb 0.7 oz) 104.8  kg (231 lb 0.7 oz)    Examination:  General exam: Appears older than stated age.  Awake and more calm  respiratory system: Bilateral decreased breath sounds at bases Cardiovascular system: S1 & S2 heard, rate controlled  gastrointestinal system: Abdomen is nondistended, soft and nontender. Normal bowel sounds heard. Extremities: No cyanosis; trace edema; bilateral lower extremity dressing present    Data Reviewed: I have personally reviewed following labs and imaging studies  CBC: Recent Labs  Lab 05/12/17 0715 05/13/17 0408  WBC 5.3 5.4  NEUTROABS 3.7 3.8  HGB 10.4* 10.6*  HCT 32.3* 33.1*  MCV 85.2 85.5  PLT 179 419   Basic Metabolic Panel: Recent Labs  Lab 05/12/17 0715 05/13/17 0408  NA 133* 137  K 4.8 4.7  CL 101 105  CO2 22 24  GLUCOSE 287* 134*  BUN 45* 34*  CREATININE 2.08* 1.79*  CALCIUM 7.9* 7.9*  MG  --  1.9   GFR: Estimated Creatinine Clearance: 39.9 mL/min (A) (by C-G formula based on SCr of 1.79 mg/dL (H)). Liver Function Tests: No results for input(s): AST, ALT, ALKPHOS, BILITOT, PROT, ALBUMIN in the last 168 hours. No results for input(s): LIPASE, AMYLASE in the last 168 hours. No results for input(s): AMMONIA in the last 168 hours. Coagulation Profile: Recent Labs  Lab 05/12/17 0715  INR 1.08   Cardiac Enzymes: No results for input(s): CKTOTAL, CKMB, CKMBINDEX, TROPONINI in the last 168 hours. BNP (last 3 results) No results for input(s): PROBNP in the last 8760 hours. HbA1C: No results for input(s): HGBA1C in the last 72 hours. CBG: Recent Labs  Lab 05/13/17 1129 05/13/17 1631 05/13/17 2035 05/14/17 0745 05/14/17 1152  GLUCAP 143* 294* 195* 167* 224*   Lipid Profile: No results for input(s): CHOL, HDL, LDLCALC, TRIG, CHOLHDL, LDLDIRECT in the last 72 hours. Thyroid Function Tests: No results for input(s): TSH, T4TOTAL, FREET4, T3FREE, THYROIDAB in the last 72 hours. Anemia Panel: No results for input(s): VITAMINB12, FOLATE,  FERRITIN, TIBC, IRON, RETICCTPCT in the last 72 hours. Sepsis Labs: No results for input(s): PROCALCITON, LATICACIDVEN in the last 168 hours.  Recent Results (from the past 240 hour(s))  Surgical pcr screen     Status: Abnormal   Collection Time: 05/11/17 10:16 PM  Result Value Ref Range Status   MRSA, PCR NEGATIVE NEGATIVE Final   Staphylococcus aureus POSITIVE (A) NEGATIVE Final    Comment: (NOTE) The Xpert SA Assay (FDA approved for NASAL specimens in patients 55 years of age and older), is one component of a comprehensive surveillance program. It is not intended to diagnose infection nor to guide or monitor treatment. Performed at [facility], [place] [address], [place], [place] [address]   Culture, Urine     Status: None (Preliminary result)   Collection Time: 05/13/17 11:30 AM  Result Value Ref Range Status   Specimen Description URINE, CLEAN CATCH  Final   Special Requests NONE  Final   Culture   Final    CULTURE REINCUBATED FOR BETTER GROWTH Performed at [facility], [place] [address], [place], [place] [address]    Report Status PENDING  Incomplete  Radiology Studies: No results found.      Scheduled Meds: . atorvastatin  80 mg Oral q1800  . busPIRone  15 mg Oral TID  . carvedilol  25 mg Oral BID WC  . Chlorhexidine Gluconate Cloth  6 each Topical Daily  . docusate sodium  100 mg Oral BID  . enoxaparin (LOVENOX) injection  40 mg Subcutaneous Q24H  . ezetimibe  10 mg Oral Daily  . gabapentin  300 mg Oral BID  . insulin aspart  0-5 Units Subcutaneous QHS  . insulin aspart  0-9 Units Subcutaneous TID WC  . insulin glargine  20 Units Subcutaneous QHS  . morphine  30 mg Oral Q8H  . mupirocin ointment  1 application Nasal BID  . ranolazine  500 mg Oral BID  . sertraline  50 mg Oral Daily   Continuous Infusions: . sodium chloride    . cefTRIAXone (ROCEPHIN)  IV 1 g (05/13/17 1740)  . lactated ringers 10 mL/hr at 05/12/17 1010  .  methocarbamol (ROBAXIN)  IV       LOS: 3 days        Aline August, MD [facility] Pager [phone]  If 7PM-7AM, please contact night-coverage [email or web address] Password [facility] 05/14/2017, 12:04 PM

## 2017-05-14 NOTE — NC FL2 (Signed)
[place] LEVEL OF CARE SCREENING TOOL     IDENTIFICATION  Patient Name: [name] Birthdate: March 13, 1955 Sex: female Admission Date (Current Location): 05/11/2017  [facility] and [place] Number:  Herbalist and Address:  [facility] [place], [place] [address], [place], [place] [address]      Provider Number: [id number]  Attending Physician Name and Address:  Aline August, MD  Relative Name and Phone Number:  Garrison Columbus    Current Level of Care: Hospital Recommended Level of Care: [place] Prior Approval Number:    Date Approved/Denied:   PASRR Number: [id number](Eff. 04/04/15)  Discharge Plan: SNF    Current Diagnoses: Patient Active Problem List   Diagnosis Date Noted  . Acute encephalopathy 05/11/2017  . Osteomyelitis of great toe of right foot ([place]) 05/06/2017  . Osteomyelitis of great toe of left foot ([place]) 05/06/2017  . Community acquired pneumonia 04/04/2017  . Angina pectoris ([place]) 03/08/2017  . Aortic atherosclerosis ([place]) 01/13/2017  . At high risk for falls 01/13/2017  . Stroke-like symptoms   . Complicated migraine   . Mixed hyperlipidemia   . Generalized weakness   . TIA (transient ischemic attack) 01/02/2017  . Ulcer of toe of right foot, limited to breakdown of skin ([place]) 12/18/2016  . Non-pressure chronic ulcer of other part of right foot limited to breakdown of skin ([place]) 12/07/2016  . Diabetic foot ulcer ([place]) 12/04/2016  . Hypovolemia 12/04/2016  . Chronic pain syndrome 12/04/2016  . Diabetic peripheral neuropathy ([place]) 12/04/2016  . Hypotension   . Liver mass 09/20/2016  . Kidney disease, chronic, stage III (GFR 30-59 ml/min) (HCC) 12/17/2015  . Diabetes ([place]) 07/18/2015  . Essential hypertension 06/06/2015  . Chronic back pain 07/27/2012  . OSA on CPAP 03/21/2012    Orientation RESPIRATION BLADDER Height & Weight     Self, Situation, Place, Time  Normal Incontinent Weight: 231  lb 0.7 oz (104.8 kg) Height:  5' 6"  (167.6 cm)  BEHAVIORAL SYMPTOMS/MOOD NEUROLOGICAL BOWEL NUTRITION STATUS      Continent Diet(Carb modified, thin fluids)  AMBULATORY STATUS COMMUNICATION OF NEEDS Skin   Total Care(No ambulation at this time per MD (surgeon) -"transfers only-no gait training-weight bearing as needed for transfers") Verbally Surgical wounds(Surgical incision on right and left feet)                       Personal Care Assistance Level of Assistance  Bathing, Feeding, Dressing Bathing Assistance: Maximum assistance(Upper body-supervision; Lower body max assist) Feeding assistance: Independent Dressing Assistance: Maximum assistance(Upper body supervision, Lower bodyt max assist)     Functional Limitations Info  Sight, Hearing, Speech Sight Info: Adequate Hearing Info: Adequate Speech Info: Adequate    SPECIAL CARE FACTORS FREQUENCY  PT (By licensed PT), OT (By licensed OT)     PT Frequency: Evaluated 4/11 and a minimum of 3X per week recommended during acute inpatient stay OT Frequency: Evaluated 4/11 and a minimum of 2X per week recommended during acute inpatient stay            Contractures Contractures Info: Not present    Additional Factors Info  Code Status, Allergies Code Status Info: Full Allergies Info: Furosemide, Vancomycin Clindamycin/Lincomycin, Doxycycline, Norvasc, Prednisone, Amlodipine, Propronolol, Ibuprofen, Lisinopril, Lovastatin, Nsaids, Sulfa antibiotics   Insulin Sliding Scale Info: 0-5 Units daily at bedtime, 0-9 Units 3 times a day with meals       Current Medications (05/14/2017):  This is the current  hospital active medication list Current Facility-Administered Medications  Medication Dose Route Frequency Provider Last Rate Last Dose  . 0.9 %  sodium chloride infusion   Intravenous Continuous Newt Minion, MD      . acetaminophen (TYLENOL) tablet 650 mg  650 mg Oral Q6H PRN Newt Minion, MD       Or  . acetaminophen  (TYLENOL) suppository 650 mg  650 mg Rectal Q6H PRN Newt Minion, MD      . atorvastatin (LIPITOR) tablet 80 mg  80 mg Oral q1800 Newt Minion, MD   80 mg at 05/13/17 1740  . bisacodyl (DULCOLAX) suppository 10 mg  10 mg Rectal Daily PRN Newt Minion, MD      . busPIRone (BUSPAR) tablet 15 mg  15 mg Oral TID Newt Minion, MD   15 mg at 05/14/17 0945  . carvedilol (COREG) tablet 25 mg  25 mg Oral BID WC Newt Minion, MD   25 mg at 05/14/17 0945  . cefTRIAXone (ROCEPHIN) 1 g in sodium chloride 0.9 % 100 mL IVPB  1 g Intravenous Q24H Alekh, Kshitiz, MD 200 mL/hr at 05/13/17 1740 1 g at 05/13/17 1740  . Chlorhexidine Gluconate Cloth 2 % PADS 6 each  6 each Topical Daily Newt Minion, MD   6 each at 05/13/17 1041  . docusate sodium (COLACE) capsule 100 mg  100 mg Oral BID Newt Minion, MD   100 mg at 05/14/17 0945  . enoxaparin (LOVENOX) injection 40 mg  40 mg Subcutaneous Q24H Newt Minion, MD   40 mg at 05/14/17 0942  . ezetimibe (ZETIA) tablet 10 mg  10 mg Oral Daily Newt Minion, MD   10 mg at 05/14/17 0945  . gabapentin (NEURONTIN) capsule 300 mg  300 mg Oral BID Newt Minion, MD   300 mg at 05/14/17 0945  . insulin aspart (novoLOG) injection 0-5 Units  0-5 Units Subcutaneous QHS Alekh, Kshitiz, MD      . insulin aspart (novoLOG) injection 0-9 Units  0-9 Units Subcutaneous TID WC Aline August, MD   2 Units at 05/14/17 0941  . insulin glargine (LANTUS) injection 20 Units  20 Units Subcutaneous QHS Aline August, MD   20 Units at 05/13/17 2214  . ipratropium-albuterol (DUONEB) 0.5-2.5 (3) MG/3ML nebulizer solution 3 mL  3 mL Nebulization Q4H PRN Newt Minion, MD      . lactated ringers infusion   Intravenous Continuous Newt Minion, MD 10 mL/hr at 05/12/17 1010    . magnesium citrate solution 1 Bottle  1 Bottle Oral Once PRN Newt Minion, MD      . methocarbamol (ROBAXIN) tablet 500 mg  500 mg Oral Q6H PRN Newt Minion, MD   500 mg at 05/12/17 1536   Or  .  methocarbamol (ROBAXIN) 500 mg in dextrose 5 % 50 mL IVPB  500 mg Intravenous Q6H PRN Newt Minion, MD      . metoCLOPramide (REGLAN) tablet 5-10 mg  5-10 mg Oral Q8H PRN Newt Minion, MD       Or  . metoCLOPramide (REGLAN) injection 5-10 mg  5-10 mg Intravenous Q8H PRN Newt Minion, MD      . morphine (MS CONTIN) 12 hr tablet 30 mg  30 mg Oral Q8H Newt Minion, MD   30 mg at 05/14/17 0604  . morphine (MSIR) tablet 15 mg  15 mg Oral Q6H PRN Newt Minion,  MD   15 mg at 05/12/17 1536  . mupirocin ointment (BACTROBAN) 2 % 1 application  1 application Nasal BID Newt Minion, MD   1 application at 45/99/77 0944  . ondansetron (ZOFRAN) tablet 4 mg  4 mg Oral Q6H PRN Newt Minion, MD       Or  . ondansetron [facility]) injection 4 mg  4 mg Intravenous Q6H PRN Newt Minion, MD      . polyethylene glycol (MIRALAX / GLYCOLAX) packet 17 g  17 g Oral Daily PRN Newt Minion, MD      . ranolazine (RANEXA) 12 hr tablet 500 mg  500 mg Oral BID Newt Minion, MD   500 mg at 05/14/17 0945  . sertraline (ZOLOFT) tablet 50 mg  50 mg Oral Daily Newt Minion, MD   50 mg at 05/14/17 0945     Discharge Medications: Please see discharge summary for a list of discharge medications.  Relevant Imaging Results:  Relevant Lab Results:   Additional Information ss#[id number].  Surgery 4/10 and amputation of left reat toe and right first ray.  Sable Feil, LCSW

## 2017-05-14 NOTE — Clinical Social Work Note (Addendum)
Patient in need of SNF for ST rehab and [facility] been secured. Per call with MD, patient will be ready for discharge on Saturday, 4/13. Daughter [name] contacted and is aware of Saturday's discharge.  Patient has authorization form [employer] effective today (4/12) through 4/14; authorization number [id number]; 4/14 is next review date; RUG level RVB and 547 minutes of therapy per week. Admissions director Nira Conn [phone]) contacted and had received authorization information from [facility] representative. She can admit patient on Saturday. Weekend CSW will be advised.  Trei Schoch Givens, MSW, LCSW Licensed Clinical Social Worker [place] [phone]

## 2017-05-14 NOTE — Clinical Social Work Placement (Signed)
   CLINICAL SOCIAL WORK PLACEMENT  NOTE **Patient will d/c to [facility] 05/15/17 - Authorization information in Additional Comments below**.  Date:  05/14/2017  Patient Details  Name: [name] MRN: [id number] Date of Birth: Jun 28, 1955  Clinical Social Work is seeking post-discharge placement for this patient at the [place] level of care (*CSW will initial, date and re-position this form in  chart as items are completed):  Yes   Patient/family provided with [place] Work Department's list of facilities offering this level of care within the geographic area requested by the patient (or if unable, by the patient's family).  Yes   Patient/family informed of their freedom to choose among providers that offer the needed level of care, that participate in Medicare, Medicaid or managed care program needed by the patient, have an available bed and are willing to accept the patient.  Yes   Patient/family informed of [facility]'s ownership interest in [facility] and [facility], as well as of the fact that they are under no obligation to receive care at these facilities.  PASRR submitted to EDS on       PASRR number received on       Existing PASRR number confirmed on 05/14/17     FL2 transmitted to all facilities in geographic area requested by pt/family on 05/14/17     FL2 transmitted to all facilities within larger geographic area on       Patient informed that his/her managed care company has contracts with or will negotiate with certain facilities, including the following:        Yes   Patient/family informed of bed offers received.  Patient chooses bed at [facility]     Physician recommends and patient chooses bed at      Patient to be transferred [facility] on 05/15/17(Per MD, patient will be medically stable to d/c on Saturday).  Patient to be transferred to facility by Ambulance     Patient family notified on  05/14/17 of transfer.  Name of family member notified:  Garrison Columbus - daughter; [phone]     PHYSICIAN      Additional Comment:  05/14/17 - Received insurance authorization through [employer]. Josem Kaufmann #[id number] eff. 4/12; RUG Level RVB; 547 therapy minutes approved per week.   _______________________________________________ Sable Feil, LCSW 05/14/2017, 6:47 PM

## 2017-05-14 NOTE — Progress Notes (Signed)
Pt has declined to use the CPAP tonight. The patient states that she does not wear hers at home

## 2017-05-14 NOTE — Progress Notes (Signed)
Patient is complaining of back and leg spasms unrelieved by robaxin. Patient requesting Zanaflex, which she takes at home. MD notified. No further orders. Will continue to monitor.

## 2017-05-14 NOTE — Care Management Important Message (Signed)
Important Message  Patient Details  Name: [name] MRN: [id number] Date of Birth: October 07, 1955   Medicare Important Message Given:  Yes    [name] Montine Circle 05/14/2017, 2:05 PM

## 2017-05-15 DIAGNOSIS — E1142 Type 2 diabetes mellitus with diabetic polyneuropathy: Secondary | ICD-10-CM

## 2017-05-15 LAB — CBC WITH DIFFERENTIAL/PLATELET
Basophils Absolute: 0 10*3/uL (ref 0.0–0.1)
Basophils Relative: 0 %
EOS ABS: 0.1 10*3/uL (ref 0.0–0.7)
EOS PCT: 3 %
HCT: 34.2 % — ABNORMAL LOW (ref 36.0–46.0)
Hemoglobin: 10.8 g/dL — ABNORMAL LOW (ref 12.0–15.0)
LYMPHS ABS: 1 10*3/uL (ref 0.7–4.0)
LYMPHS PCT: 19 %
MCH: 27.3 pg (ref 26.0–34.0)
MCHC: 31.6 g/dL (ref 30.0–36.0)
MCV: 86.6 fL (ref 78.0–100.0)
MONO ABS: 0.5 10*3/uL (ref 0.1–1.0)
Monocytes Relative: 10 %
Neutro Abs: 3.6 10*3/uL (ref 1.7–7.7)
Neutrophils Relative %: 68 %
Platelets: 206 10*3/uL (ref 150–400)
RBC: 3.95 MIL/uL (ref 3.87–5.11)
RDW: 14.5 % (ref 11.5–15.5)
WBC: 5.3 10*3/uL (ref 4.0–10.5)

## 2017-05-15 LAB — BASIC METABOLIC PANEL
Anion gap: 8 (ref 5–15)
BUN: 24 mg/dL — AB (ref 6–20)
CALCIUM: 8.2 mg/dL — AB (ref 8.9–10.3)
CO2: 24 mmol/L (ref 22–32)
CREATININE: 1.5 mg/dL — AB (ref 0.44–1.00)
Chloride: 105 mmol/L (ref 101–111)
GFR calc Af Amer: 42 mL/min — ABNORMAL LOW (ref >60)
GFR, EST NON AFRICAN AMERICAN: 36 mL/min — AB (ref >60)
GLUCOSE: 209 mg/dL — AB (ref 65–99)
Potassium: 4.7 mmol/L (ref 3.5–5.1)
Sodium: 137 mmol/L (ref 135–145)

## 2017-05-15 LAB — MAGNESIUM: Magnesium: 1.8 mg/dL (ref 1.7–2.4)

## 2017-05-15 LAB — GLUCOSE, CAPILLARY
Glucose-Capillary: 195 mg/dL — ABNORMAL HIGH (ref 65–99)
Glucose-Capillary: 234 mg/dL — ABNORMAL HIGH (ref 65–99)

## 2017-05-15 MED ORDER — GABAPENTIN 300 MG PO CAPS: 300.0000 mg | ORAL_CAPSULE | Freq: Two times a day (BID) | ORAL | Status: AC

## 2017-05-15 MED ORDER — BISACODYL 10 MG RE SUPP: 10.0000 mg | Freq: Every day | RECTAL | 0 refills | Status: DC | PRN

## 2017-05-15 MED ORDER — MORPHINE SULFATE 15 MG PO TABS: 15.0000 mg | ORAL_TABLET | Freq: Three times a day (TID) | ORAL | 0 refills | Status: DC | PRN

## 2017-05-15 MED ORDER — POLYETHYLENE GLYCOL 3350 17 G PO PACK: 17.0000 g | PACK | Freq: Every day | ORAL | 0 refills | Status: AC | PRN

## 2017-05-15 MED ORDER — METHOCARBAMOL 750 MG PO TABS: 750.0000 mg | ORAL_TABLET | Freq: Four times a day (QID) | ORAL | 0 refills | Status: DC | PRN

## 2017-05-15 MED ORDER — INSULIN GLARGINE 100 UNIT/ML ~~LOC~~ SOLN: 25.0000 [IU] | Freq: Every day | SUBCUTANEOUS | 0 refills | Status: DC

## 2017-05-15 MED ORDER — DOCUSATE SODIUM 100 MG PO CAPS
100.0000 mg | ORAL_CAPSULE | Freq: Two times a day (BID) | ORAL | 0 refills | Status: AC
Start: 2017-05-15 — End: ?

## 2017-05-15 MED ORDER — MORPHINE SULFATE ER 30 MG PO TBCR: 30.0000 mg | EXTENDED_RELEASE_TABLET | Freq: Three times a day (TID) | ORAL | 0 refills | Status: DC | PRN

## 2017-05-15 MED ORDER — CEPHALEXIN 500 MG PO CAPS: 500.0000 mg | ORAL_CAPSULE | Freq: Two times a day (BID) | ORAL | 0 refills | Status: AC

## 2017-05-15 NOTE — Clinical Social Work Placement (Signed)
   CLINICAL SOCIAL WORK PLACEMENT  NOTE  Date:  05/15/2017  Patient Details  Name: [name] MRN: [id number] Date of Birth: Sep 02, 1955  Clinical Social Work is seeking post-discharge placement for this patient at the [place] level of care (*CSW will initial, date and re-position this form in  chart as items are completed):  Yes   Patient/family provided with [place] Work Department's list of facilities offering this level of care within the geographic area requested by the patient (or if unable, by the patient's family).  Yes   Patient/family informed of their freedom to choose among providers that offer the needed level of care, that participate in Medicare, Medicaid or managed care program needed by the patient, have an available bed and are willing to accept the patient.  Yes   Patient/family informed of [place]'s ownership interest in [facility] and [facility], as well as of the fact that they are under no obligation to receive care at these facilities.  PASRR submitted to EDS on       PASRR number received on       Existing PASRR number confirmed on 05/14/17     FL2 transmitted to all facilities in geographic area requested by pt/family on 05/14/17     FL2 transmitted to all facilities within larger geographic area on       Patient informed that his/her managed care company has contracts with or will negotiate with certain facilities, including the following:        Yes   Patient/family informed of bed offers received.  Patient [facility] bed at [facility]     Physician recommends and patient chooses bed at      Patient to be transferred [facility] on 05/14/17(Per MD, patient will be medically stable to d/c on Saturday).  Patient to be transferred to facility by Ambulance     Patient family notified on 05/14/17 of transfer.  Name of family member notified:  Garrison Columbus - daughter; [phone]      PHYSICIAN       Additional Comment:    _______________________________________________ Eileen Stanford, LCSW 05/15/2017, 2:25 PM

## 2017-05-15 NOTE — Progress Notes (Signed)
Report called and given to RN at [employer].

## 2017-05-15 NOTE — Discharge Summary (Signed)
Physician Discharge Summary  [name] ENM:[id number] DOB: August 06, 1955 DOA: 05/11/2017  PCP: Forrest Moron, MD  Admit date: 05/11/2017 Discharge date: 05/15/2017  Admitted From: Home Disposition:  SNF  Recommendations for Outpatient Follow-up:  1. Follow up with nursing home provider at earliest convenience with repeat CBC/BMP 2. Follow-up with Dr. Farley Ly in a week 3. Fall precautions 4. Wound care as per Dr. Gavin Potters recommendations 5. Follow-up with pain management at earliest [name]: No Equipment/Devices: None  Discharge Condition: Stable CODE STATUS: Full Diet recommendation: Heart Healthy / Carb Modified   Brief/Interim Summary: 62 y.o.femalewith medical history significant of DM type II withRand Ldiabetic foot ulcers,peripheral neuropathy,HTN, CKD stage 3, NASH, OSA w/ CPAP, H/o CVA with residual left-sided weakness, TIAs, chronic pain, and anxiety who presented to [facility] with altered mental status.  Patient was transferred to [facility] for surgical intervention by orthopedics for osteomyelitis of bilateral toes.  She had surgical intervention by orthopedics on 05/12/2017.  Patient will need outpatient follow-up with Dr. Farley Ly.  Patient was started on intravenous Rocephin for probable UTI causing delirium.  Urine culture is growing E. coli, susceptibilities pending.  Will discharge on oral Keflex for 3 more days.  She will be discharged to nursing home once bed is available.     Discharge Diagnoses:  Principal Problem:   Acute encephalopathy Active Problems:   Essential hypertension   OSA on CPAP   Diabetes (HCC)   Chronic pain syndrome   Osteomyelitis of great toe of right foot (HCC)   Osteomyelitis of great toe of left foot (HCC)  Acute probably metabolic encephalopathy:  -CT scan of the brain was negative for any acute abnormalities.    -Patient is more awake this morning and answering  questions  Delirium -Improving.  Patient was started on Rocephin; antibiotic plan as below -Patient is on chronic pain meds and other sedative medications which can make things worse. -Currently stable.  Probable UTI, probably present on admission -Urine culture growing E. coli.  Susceptibilities pending.  Discharge on oral Keflex for 3 more days.  Osteomyelitis of the bilateral great toes, diabetic foot ulcers:  -Status post surgical intervention on 05/12/2017 orthopedics.  Outpatient follow-up with Dr. Farley Ly.  Wound care as per orthopedics. -Continue PT in the nursing home  Acute kidney injury on chronic kidney disease: Patient baseline creatinine previously noted to be around 1.3 presented to the outside hospital with a creatinine of 2.18 and BUN 37. Suspect prerenal cause of symptoms related with uncontrolled diabetes.  -Improving.   -No labs today.    Outpatient follow-up.  Encourage oral intake  DM type II: Last hemoglobin A1c noted to be 12.9 on 03/26/2017. -NPH has been discontinued  inpatient as per Diabetes coordinator recommendations -Continue Lantus and NovoLog.  Outpatient follow-up  chronic pain syndrome: Patient followed by Dr. Mirna Mires [facility]. Recent change in medications from Nucentya to MS Contin - Continue home pain medication regimen - If symptoms recur may warrant reduction in pain medication  -Outpatient follow-up with pain management  Peripheral neuropathy - Continue dose adjusted gabapentin  Anxiety: stable - Continue Zoloft and BuSpar  OSA on CPAP -Continue CPAP    Discharge Instructions  Discharge Instructions    Call MD for:  difficulty breathing, headache or visual disturbances   Complete by:  As directed    Call MD for:  extreme fatigue   Complete by:  As directed    Call MD for:  hives   Complete by:  As directed    Call MD for:  persistant dizziness or light-headedness   Complete by:  As  directed    Call MD for:  persistant nausea and vomiting   Complete by:  As directed    Call MD for:  redness, tenderness, or signs of infection (pain, swelling, redness, odor or green/yellow discharge around incision site)   Complete by:  As directed    Call MD for:  severe uncontrolled pain   Complete by:  As directed    Call MD for:  temperature >100.4   Complete by:  As directed    Diet - low sodium heart healthy   Complete by:  As directed    Diet Carb Modified   Complete by:  As directed    Discharge instructions   Complete by:  As directed    Fall precautions Wound care as per orthopedics recommendations   Increase activity slowly   Complete by:  As directed      Allergies as of 05/15/2017      Reactions   Furosemide Other (See Comments)   Dehydration leading to kidney failure  "my kidneys shutdown"   Norvasc [amlodipine Besylate] Swelling   "Severe edema"   Prednisone Other (See Comments)   Ketoacidosis   Vancomycin Other (See Comments)   Total organ shut down "It shut my kidneys down"   Amlodipine Swelling   SWELLING/EDEMA REACTION UNSPECIFIED    Clindamycin/lincomycin Hives, Itching   Doxycycline Hives, Rash   Propranolol Cough   Bronchospasms   Ibuprofen Other (See Comments)   WAS TOLD TO NOT TAKE THIS BECAUSE OF HER KIDNEYS AND LIVER   Lisinopril Other (See Comments)   WAS TOLD TO NOT TAKE THIS BECAUSE OF HER KIDNEYS AND LIVER   Lovastatin Other (See Comments)   WAS TOLD TO NOT TAKE THIS BECAUSE OF HER KIDNEYS AND LIVER   Norvasc [amlodipine Besylate] Other (See Comments)   Severe edema   Nsaids Other (See Comments)   WAS TOLD TO NOT TAKE THIS BECAUSE OF HER KIDNEYS AND LIVER   Sulfa Antibiotics Nausea Only      Medication List    STOP taking these medications   BYETTA 5 MCG PEN 5 MCG/0.02ML Sopn injection Generic drug:  exenatide   insulin NPH Human 100 UNIT/ML injection Commonly known as:  NOVOLIN N   NUCYNTA 100 MG Tabs Generic drug:   Tapentadol HCl   tiZANidine 4 MG tablet Commonly known as:  ZANAFLEX     TAKE these medications   atorvastatin 80 MG tablet Commonly known as:  LIPITOR Take 1 tablet (80 mg total) by mouth daily at 6 PM.   BD ULTRA-FINE PEN NEEDLES 29G X 12.7MM Misc Generic drug:  Insulin Pen Needle 2 packages with 100 in each package   bisacodyl 10 MG suppository Commonly known as:  DULCOLAX Place 1 suppository (10 mg total) rectally daily as needed for moderate constipation.   busPIRone 15 MG tablet Commonly known as:  BUSPAR Take 1 tablet (15 mg total) by mouth 3 (three) times daily.   carvedilol 25 MG tablet Commonly known as:  COREG Take 1 tablet (25 mg total) by mouth 2 (two) times daily.   cephALEXin 500 MG capsule Commonly known as:  KEFLEX Take 1 capsule (500 mg total) by mouth 2 (two) times daily for 3 days.   clopidogrel 75 MG tablet Commonly known as:  PLAVIX Take 1 tablet (75 mg total) by mouth daily.  diclofenac sodium 1 % Gel Commonly known as:  VOLTAREN Apply 2 g topically as needed (muscle spasms).   docusate sodium 100 MG capsule Commonly known as:  COLACE Take 1 capsule (100 mg total) by mouth 2 (two) times daily.   ezetimibe 10 MG tablet Commonly known as:  ZETIA Take 1 tablet (10 mg total) by mouth daily.   gabapentin 300 MG capsule Commonly known as:  NEURONTIN Take 1 capsule (300 mg total) by mouth 2 (two) times daily. What changed:  when to take this   insulin aspart 100 UNIT/ML FlexPen Commonly known as:  NOVOLOG Inject 10 Units into the skin 3 (three) times daily before meals. Take only if blood sugar is over 300   insulin glargine 100 UNIT/ML injection Commonly known as:  LANTUS Inject 0.25 mLs (25 Units total) into the skin at bedtime.   INSULIN SYRINGE 1CC/29G 29G X 1/2" 1 ML Misc Commonly known as:  B-D INS SYR ULTRAFINE 1CC/29G Used to give insulin injections 4x daily.   methocarbamol 750 MG tablet Commonly known as:  ROBAXIN Take 1  tablet (750 mg total) by mouth every 6 (six) hours as needed for muscle spasms.   morphine 30 MG 12 hr tablet Commonly known as:  MS CONTIN Take 1 tablet (30 mg total) by mouth every 8 (eight) hours as needed. What changed:  Another medication with the same name was changed. Make sure you understand how and when to take each.   morphine 15 MG tablet Commonly known as:  MSIR Take 1 tablet (15 mg total) by mouth every 8 (eight) hours as needed for moderate pain. What changed:  reasons to take this   polyethylene glycol packet Commonly known as:  MIRALAX / GLYCOLAX Take 17 g by mouth daily as needed for mild constipation.   ranolazine 1000 MG SR tablet Commonly known as:  RANEXA Take 1 tablet (1,000 mg total) by mouth 2 (two) times daily.   sertraline 50 MG tablet Commonly known as:  ZOLOFT Take 1 tablet (50 mg total) by mouth daily.      Follow-up Information    Newt Minion, MD In 1 week.   Specialty:  Orthopedic Surgery Contact information: [place] [address] [phone]        Forrest Moron, MD. Schedule an appointment as soon as possible for a visit in 1 week(s).   Specialty:  Internal Medicine Why:  with repeat cbc/bmp Contact information: [place] [address] [phone]        Pain management Follow up.   Why:  At earliest convenience         Allergies  Allergen Reactions  . Furosemide Other (See Comments)    Dehydration leading to kidney failure  "my kidneys shutdown"  . Norvasc [Amlodipine Besylate] Swelling    "Severe edema"  . Prednisone Other (See Comments)    Ketoacidosis  . Vancomycin Other (See Comments)    Total organ shut down "It shut my kidneys down"  . Amlodipine Swelling    SWELLING/EDEMA REACTION UNSPECIFIED   . Clindamycin/Lincomycin Hives and Itching  . Doxycycline Hives and Rash  . Propranolol Cough    Bronchospasms  . Ibuprofen Other (See Comments)    WAS TOLD TO NOT TAKE  THIS BECAUSE OF HER KIDNEYS AND LIVER  . Lisinopril Other (See Comments)    WAS TOLD TO NOT TAKE THIS BECAUSE OF HER KIDNEYS AND LIVER  . Lovastatin Other (See Comments)  WAS TOLD TO NOT TAKE THIS BECAUSE OF HER KIDNEYS AND LIVER  . Norvasc [Amlodipine Besylate] Other (See Comments)    Severe edema  . Nsaids Other (See Comments)    WAS TOLD TO NOT TAKE THIS BECAUSE OF HER KIDNEYS AND LIVER  . Sulfa Antibiotics Nausea Only    Consultations:  Orthopedics   Procedures/Studies: Dg Knee 1-2 Views Left  Result Date: 04/23/2017 CLINICAL DATA:  Fall, knee pain EXAM: LEFT KNEE - 1-2 VIEW COMPARISON:  None available FINDINGS: Severe tricompartmental left knee osteoarthritis with joint space loss, sclerosis and bony spurring. Medial compartment is most affected. Normal alignment without acute osseous finding, fracture or effusion. IMPRESSION: Severe left knee tricompartmental osteoarthritis. Electronically Signed   By: Jerilynn Mages.  Shick M.D.   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 First ray amputation on the right on 05/12/2017     Subjective: Patient seen and examined at bedside.  She is awake and answering questions.  No overnight fever, nausea or vomiting.  Discharge Exam: Vitals:   05/15/17 0559 05/15/17 0842  BP: (!) 158/77 132/67  Pulse: 72 71  Resp:  18  Temp: 98.5 F (36.9 C) 98 F (36.7 C)  SpO2: 95% 92%   Vitals:   05/14/17 1703 05/14/17 2018 05/15/17 0559 05/15/17 0842  BP: 126/78 (!) 120/58 (!) 158/77 132/67  Pulse: 68 66 72 71  Resp: 18   18  Temp: 98.5 F (36.9 C) 98.8 F (37.1 C) 98.5 F (36.9 C) 98 F (36.7 C)  TempSrc: Oral Oral Oral Oral  SpO2: 97% 93% 95% 92%  Weight:  104.6 kg (230 lb 9.6 oz)    Height:        General: Pt is alert, awake; distress.  Looks older than stated age.   Cardiovascular: Rate controlled, S1/S2 + Respiratory: Bilateral decreased breath sounds at bases Abdominal: Soft, NT, ND, bowel sounds  + Extremities: Trace edema, no cyanosis; bilateral lower extremity dressing present    The results of significant diagnostics from this hospitalization (including imaging, microbiology, ancillary and laboratory) are listed below for reference.     Microbiology: Recent Results (from the past 240 hour(s))  Surgical pcr screen     Status: Abnormal   Collection Time: 05/11/17 10:16 PM  Result Value Ref Range Status   MRSA, PCR NEGATIVE NEGATIVE Final   Staphylococcus aureus POSITIVE (A) NEGATIVE Final    Comment: (NOTE) The Xpert SA Assay (FDA approved for NASAL specimens in patients 69 years of age and older), is one component of a comprehensive surveillance program. It is not intended to diagnose infection nor to guide or monitor treatment. Performed at [facility], [place] [address] [place], [place] [address]   Culture, Urine     Status: Abnormal (Preliminary result)   Collection Time: 05/13/17 11:30 AM  Result Value Ref Range Status   Specimen Description URINE, CLEAN CATCH  Final   Special Requests NONE  Final   Culture (A)  Final    50,000 COLONIES/mL ESCHERICHIA COLI SUSCEPTIBILITIES TO FOLLOW Performed at [facility], [place] [address], [place],  [address]    Report Status PENDING  Incomplete     Labs: BNP (last 3 results) No results for input(s): BNP in the last 8760 hours. Basic Metabolic Panel: Recent Labs  Lab 05/12/17 0715 05/13/17 0408  NA 133* 137  K 4.8 4.7  CL 101 105  CO2 22 24  GLUCOSE 287* 134*  BUN 45* 34*  CREATININE 2.08* 1.79*  CALCIUM 7.9* 7.9*  MG  --  1.9   Liver Function Tests: No results for input(s): AST, ALT, ALKPHOS, BILITOT, PROT, ALBUMIN in the last 168 hours. No results for input(s): LIPASE, AMYLASE in the last 168 hours. No results for input(s): AMMONIA in the last 168 hours. CBC: Recent Labs  Lab 05/12/17 0715 05/13/17 0408  WBC 5.3 5.4  NEUTROABS 3.7 3.8  HGB 10.4* 10.6*  HCT 32.3* 33.1*  MCV 85.2  85.5  PLT 179 205   Cardiac Enzymes: No results for input(s): CKTOTAL, CKMB, CKMBINDEX, TROPONINI in the last 168 hours. BNP: Invalid input(s): POCBNP CBG: Recent Labs  Lab 05/14/17 0745 05/14/17 1152 05/14/17 1704 05/14/17 2019 05/15/17 0800  GLUCAP 167* 224* 215* 271* 195*   D-Dimer No results for input(s): DDIMER in the last 72 hours. Hgb A1c No results for input(s): HGBA1C in the last 72 hours. Lipid Profile No results for input(s): CHOL, HDL, LDLCALC, TRIG, CHOLHDL, LDLDIRECT in the last 72 hours. Thyroid function studies No results for input(s): TSH, T4TOTAL, T3FREE, THYROIDAB in the last 72 hours.  Invalid input(s): FREET3 Anemia work up No results for input(s): VITAMINB12, FOLATE, FERRITIN, TIBC, IRON, RETICCTPCT in the last 72 hours. Urinalysis    Component Value Date/Time   COLORURINE YELLOW 05/13/2017 0927   APPEARANCEUR CLOUDY (A) 05/13/2017 0927   LABSPEC 1.010 05/13/2017 0927   PHURINE 6.0 05/13/2017 0927   GLUCOSEU NEGATIVE 05/13/2017 0927   HGBUR MODERATE (A) 05/13/2017 0927   BILIRUBINUR NEGATIVE 05/13/2017 0927   BILIRUBINUR negative 11/23/2016 1556   KETONESUR NEGATIVE 05/13/2017 0927   PROTEINUR NEGATIVE 05/13/2017 0927   UROBILINOGEN 0.2 11/23/2016 1556   NITRITE NEGATIVE 05/13/2017 0927   LEUKOCYTESUR MODERATE (A) 05/13/2017 0927   Sepsis Labs Invalid input(s): PROCALCITONIN,  WBC,  LACTICIDVEN Microbiology Recent Results (from the past 240 hour(s))  Surgical pcr screen     Status: Abnormal   Collection Time: 05/11/17 10:16 PM  Result Value Ref Range Status   MRSA, PCR NEGATIVE NEGATIVE Final   Staphylococcus aureus POSITIVE (A) NEGATIVE Final    Comment: (NOTE) The Xpert SA Assay (FDA approved for NASAL specimens in patients 19 years of age and older), is one component of a comprehensive surveillance program. It is not intended to diagnose infection nor to guide or monitor treatment. Performed at [facility], [place] [address], [place], [place] [address]   Culture, Urine     Status: Abnormal (Preliminary result)   Collection Time: 05/13/17 11:30 AM  Result Value Ref Range Status   Specimen Description URINE, CLEAN CATCH  Final   Special Requests NONE  Final   Culture (A)  Final    50,000 COLONIES/mL ESCHERICHIA COLI SUSCEPTIBILITIES TO FOLLOW Performed at [facility], [place] [address] [place], [place] [address]    Report Status PENDING  Incomplete     Time coordinating discharge: 35 minutes  SIGNED:   Aline August, MD  [facility] 05/15/2017, 9:05 AM Pager: [phone]  If 7PM-7AM, please contact night-coverage [email or web address] Password [email or web address]

## 2017-05-15 NOTE — Progress Notes (Signed)
[name] to be D/C'd Skilled nursing facility per MD order.  Discussed prescriptions and follow up appointments with the patient. Prescriptions given to patient, medication list explained in detail. Pt verbalized understanding.  Allergies as of 05/15/2017      Reactions   Furosemide Other (See Comments)   Dehydration leading to kidney failure  "my kidneys shutdown"   Norvasc [amlodipine Besylate] Swelling   "Severe edema"   Prednisone Other (See Comments)   Ketoacidosis   Vancomycin Other (See Comments)   Total organ shut down "It shut my kidneys down"   Amlodipine Swelling   SWELLING/EDEMA REACTION UNSPECIFIED    Clindamycin/lincomycin Hives, Itching   Doxycycline Hives, Rash   Propranolol Cough   Bronchospasms   Ibuprofen Other (See Comments)   WAS TOLD TO NOT TAKE THIS BECAUSE OF HER KIDNEYS AND LIVER   Lisinopril Other (See Comments)   WAS TOLD TO NOT TAKE THIS BECAUSE OF HER KIDNEYS AND LIVER   Lovastatin Other (See Comments)   WAS TOLD TO NOT TAKE THIS BECAUSE OF HER KIDNEYS AND LIVER   Norvasc [amlodipine Besylate] Other (See Comments)   Severe edema   Nsaids Other (See Comments)   WAS TOLD TO NOT TAKE THIS BECAUSE OF HER KIDNEYS AND LIVER   Sulfa Antibiotics Nausea Only      Medication List    STOP taking these medications   BYETTA 5 MCG PEN 5 MCG/0.02ML Sopn injection Generic drug:  exenatide   insulin NPH Human 100 UNIT/ML injection Commonly known as:  NOVOLIN N   NUCYNTA 100 MG Tabs Generic drug:  Tapentadol HCl   tiZANidine 4 MG tablet Commonly known as:  ZANAFLEX     TAKE these medications   atorvastatin 80 MG tablet Commonly known as:  LIPITOR Take 1 tablet (80 mg total) by mouth daily at 6 PM.   BD ULTRA-FINE PEN NEEDLES 29G X 12.7MM Misc Generic drug:  Insulin Pen Needle 2 packages with 100 in each package   bisacodyl 10 MG suppository Commonly known as:  DULCOLAX Place 1 suppository (10 mg total) rectally daily as needed for moderate  constipation.   busPIRone 15 MG tablet Commonly known as:  BUSPAR Take 1 tablet (15 mg total) by mouth 3 (three) times daily.   carvedilol 25 MG tablet Commonly known as:  COREG Take 1 tablet (25 mg total) by mouth 2 (two) times daily.   cephALEXin 500 MG capsule Commonly known as:  KEFLEX Take 1 capsule (500 mg total) by mouth 2 (two) times daily for 3 days.   clopidogrel 75 MG tablet Commonly known as:  PLAVIX Take 1 tablet (75 mg total) by mouth daily.   diclofenac sodium 1 % Gel Commonly known as:  VOLTAREN Apply 2 g topically as needed (muscle spasms).   docusate sodium 100 MG capsule Commonly known as:  COLACE Take 1 capsule (100 mg total) by mouth 2 (two) times daily.   ezetimibe 10 MG tablet Commonly known as:  ZETIA Take 1 tablet (10 mg total) by mouth daily.   gabapentin 300 MG capsule Commonly known as:  NEURONTIN Take 1 capsule (300 mg total) by mouth 2 (two) times daily. What changed:  when to take this   insulin aspart 100 UNIT/ML FlexPen Commonly known as:  NOVOLOG Inject 10 Units into the skin 3 (three) times daily before meals. Take only if blood sugar is over 300   insulin glargine 100 UNIT/ML injection Commonly known as:  LANTUS Inject 0.25 mLs (25 Units total)  into the skin at bedtime.   INSULIN SYRINGE 1CC/29G 29G X 1/2" 1 ML Misc Commonly known as:  B-D INS SYR ULTRAFINE 1CC/29G Used to give insulin injections 4x daily.   methocarbamol 750 MG tablet Commonly known as:  ROBAXIN Take 1 tablet (750 mg total) by mouth every 6 (six) hours as needed for muscle spasms.   morphine 30 MG 12 hr tablet Commonly known as:  MS CONTIN Take 1 tablet (30 mg total) by mouth every 8 (eight) hours as needed. What changed:  Another medication with the same name was changed. Make sure you understand how and when to take each.   morphine 15 MG tablet Commonly known as:  MSIR Take 1 tablet (15 mg total) by mouth every 8 (eight) hours as needed for moderate  pain. What changed:  reasons to take this   polyethylene glycol packet Commonly known as:  MIRALAX / GLYCOLAX Take 17 g by mouth daily as needed for mild constipation.   ranolazine 1000 MG SR tablet Commonly known as:  RANEXA Take 1 tablet (1,000 mg total) by mouth 2 (two) times daily.   sertraline 50 MG tablet Commonly known as:  ZOLOFT Take 1 tablet (50 mg total) by mouth daily.       Vitals:   05/15/17 0559 05/15/17 0842  BP: (!) 158/77 132/67  Pulse: 72 71  Resp:  18  Temp: 98.5 F (36.9 C) 98 F (36.7 C)  SpO2: 95% 92%    Skin clean, dry and intact without evidence of skin break down, no evidence of skin tears noted. IV catheter discontinued intact. Site without signs and symptoms of complications. Dressing and pressure applied. Pt denies pain at this time. No complaints noted.  An After Visit Summary was printed and given to the patient. Patient escorted via stretcher, and D/C to [facility] via [place].  Dixie Dials RN, BSN

## 2017-05-15 NOTE — Clinical Social Work Note (Signed)
Clinical Social Worker facilitated patient discharge including contacting patient family and facility to confirm patient discharge plans.  Clinical information faxed to facility and family agreeable with plan.  CSW arranged ambulance transport via [facility] to [facility].  RN to call [phone] for report prior to discharge.  Clinical Social Worker will sign off for now as social work intervention is no longer needed. Please consult [place] again if new need arises.  [place], [place]

## 2017-05-16 LAB — URINE CULTURE

## 2017-05-17 ENCOUNTER — Ambulatory Visit: Payer: Medicare HMO | Admitting: Endocrinology

## 2017-05-17 ENCOUNTER — Telehealth (INDEPENDENT_AMBULATORY_CARE_PROVIDER_SITE_OTHER): Payer: Self-pay | Admitting: Orthopedic Surgery

## 2017-05-17 ENCOUNTER — Other Ambulatory Visit (INDEPENDENT_AMBULATORY_CARE_PROVIDER_SITE_OTHER): Payer: Self-pay

## 2017-05-17 DIAGNOSIS — Z2089 Contact with and (suspected) exposure to other communicable diseases: Secondary | ICD-10-CM

## 2017-05-17 NOTE — Telephone Encounter (Signed)
Faxed order to advise to leave surgical dressing intact until appt on Wednesday will update orders at that time. If the dressing becomes soiled they may remove and apply a dry dressing and then change this daily. To call with questions.

## 2017-05-17 NOTE — Telephone Encounter (Signed)
Charlena Cross -nurse with [facility] called needing orders for wound care faxed to her. The fax# is [phone]. The phone number is [phone]

## 2017-05-19 ENCOUNTER — Encounter (INDEPENDENT_AMBULATORY_CARE_PROVIDER_SITE_OTHER): Payer: Self-pay

## 2017-05-19 ENCOUNTER — Ambulatory Visit (INDEPENDENT_AMBULATORY_CARE_PROVIDER_SITE_OTHER): Payer: Medicare HMO

## 2017-05-19 VITALS — Ht 66.0 in | Wt 230.0 lb

## 2017-05-19 DIAGNOSIS — Z89411 Acquired absence of right great toe: Secondary | ICD-10-CM

## 2017-05-19 DIAGNOSIS — Z89412 Acquired absence of left great toe: Secondary | ICD-10-CM

## 2017-05-19 NOTE — Progress Notes (Signed)
Patient is in the office today 1 week s/p Bilateral great toe amputations on 05/12/17. She is non weight bearing with the legs down dependant. Surgical dressings are removed and the right foot is swollen. There is redness around the incision that does lessen slightly with elevation while holding the pt's leg up. The stitches are present but pulling due to the swelling. The left foot is slightly swollen and the stitches are intact. There is some redness around the incision as well. There is no drainage from either incision. A dry dressing was applied to both feet. The pt is currently residing at the [facility]. There is no consult sheet that comes with her today so orders are written on her med sheet. Advised to wash the areas with dial soap and water daily.  To apply dry dressing change daily and have the pt to elevate her feet higher than her heart. She will follow up in one week with Dr. Sharol Given.   Autumn Forrest, [place], [employer]

## 2017-05-25 ENCOUNTER — Other Ambulatory Visit: Payer: Self-pay

## 2017-05-25 ENCOUNTER — Telehealth: Payer: Self-pay

## 2017-05-25 ENCOUNTER — Telehealth: Payer: Self-pay | Admitting: Endocrinology

## 2017-05-25 MED ORDER — INSULIN ISOPHANE HUMAN 100 UNIT/ML KWIKPEN: PEN_INJECTOR | SUBCUTANEOUS | 11 refills | Status: DC

## 2017-05-25 NOTE — Telephone Encounter (Signed)
I called patient but had to leave VM that PA was approved for Humulin [id number]. I asked that she call back to verify which pharmacy was best to send this to. I also stated that this would replace the lantus insulin.

## 2017-05-25 NOTE — Telephone Encounter (Signed)
Patient is returning call from earlier. She would the insulin sent to the     [facility] [id number] - [place], [place]

## 2017-05-26 NOTE — Telephone Encounter (Signed)
I have sent to correct pharmacy.

## 2017-05-27 ENCOUNTER — Encounter (INDEPENDENT_AMBULATORY_CARE_PROVIDER_SITE_OTHER): Payer: Self-pay | Admitting: Orthopedic Surgery

## 2017-05-27 ENCOUNTER — Ambulatory Visit (INDEPENDENT_AMBULATORY_CARE_PROVIDER_SITE_OTHER): Payer: Medicare HMO | Admitting: Orthopedic Surgery

## 2017-05-27 DIAGNOSIS — Z89412 Acquired absence of left great toe: Secondary | ICD-10-CM

## 2017-05-27 DIAGNOSIS — Z89411 Acquired absence of right great toe: Secondary | ICD-10-CM

## 2017-05-27 NOTE — Progress Notes (Signed)
Office Visit Note   Patient: [name]           Date of Birth: 10/23/1955           MRN: [id number] Visit Date: 05/27/2017              Requested by: Forrest Moron, MD [place],  [id number] PCP: Forrest Moron, MD  Chief Complaint  Patient presents with  . Left Foot - Pain  . Right Foot - Pain      HPI: Patient presents in follow-up status post bilateral great toe amputations.  She is at skilled nursing.  Assessment & Plan: Visit Diagnoses:  1. History of amputation of right great toe ([place])   2. History of amputation of left great toe ([facility])     Plan: Incisions of healed nicely she Will have sutures removed today recommended extra-large medical compression stockings to be worn daily calf measures 41 cm in circumference she may begin weightbearing as tolerated in 2 weeks.  Follow-Up Instructions: Return in about 2 weeks (around 06/10/2017).   Ortho Exam  Patient is alert, oriented, no adenopathy, well-dressed, normal affect, normal respiratory effort. Examination incisions are well-healed there is no redness no cellulitis no drainage no wound dehiscence.  She has good dorsiflexion of the ankle she has good pulses.  Her calf measures 41 cm in circumference.  Imaging: No results found. No images are attached to the encounter.  Labs: Lab Results  Component Value Date   HGBA1C 11.3 (H) 04/23/2017   HGBA1C 12.9 03/26/2017   HGBA1C 12.4 (H) 01/02/2017   ESRSEDRATE 39 (H) 12/05/2016   CRP 3.7 (H) 12/05/2016   REPTSTATUS 05/16/2017 FINAL 05/13/2017   CULT (A) 05/13/2017    50,000 COLONIES/mL ESCHERICHIA COLI 50,000 COLONIES/mL VANCOMYCIN RESISTANT ENTEROCOCCUS    LABORGA ESCHERICHIA COLI (A) 05/13/2017   LABORGA VANCOMYCIN RESISTANT ENTEROCOCCUS (A) 05/13/2017    @LABSALLVALUES (HGBA1)@  There is no height or weight on file to calculate BMI.  Orders:  No orders of the defined types were placed in this encounter.  No orders of the  defined types were placed in this encounter.    Procedures: No procedures performed  Clinical Data: No additional findings.  ROS:  All other systems negative, except as noted in the HPI. Review of Systems  Objective: Vital Signs: There were no vitals taken for this visit.  Specialty Comments:  No specialty comments available.  PMFS History: Patient Active Problem List   Diagnosis Date Noted  . Acute encephalopathy 05/11/2017  . Osteomyelitis of great toe of right foot ([place]) 05/06/2017  . Osteomyelitis of great toe of left foot ([place]) 05/06/2017  . Community acquired pneumonia 04/04/2017  . Angina pectoris ([place]) 03/08/2017  . Aortic atherosclerosis ([place]) 01/13/2017  . At high risk for falls 01/13/2017  . Stroke-like symptoms   . Complicated migraine   . Mixed hyperlipidemia   . Generalized weakness   . TIA (transient ischemic attack) 01/02/2017  . Ulcer of toe of right foot, limited to breakdown of skin ([place]) 12/18/2016  . Non-pressure chronic ulcer of other part of right foot limited to breakdown of skin ([place]) 12/07/2016  . Diabetic foot ulcer ([place]) 12/04/2016  . Hypovolemia 12/04/2016  . Chronic pain syndrome 12/04/2016  . Diabetic peripheral neuropathy ([place]) 12/04/2016  . Hypotension   . Liver mass 09/20/2016  . Kidney disease, chronic, stage III (GFR 30-59 ml/min) (H[facility]) 12/17/2015  . Diabetes ([place]) 07/18/2015  . Essential hypertension 06/06/2015  . Chronic  back pain 07/27/2012  . OSA on CPAP 03/21/2012   Past Medical History:  Diagnosis Date  . Allergy   . Arthritis   . Chronic kidney disease   . Chronic pain   . Coronary artery disease   . Diabetes mellitus without complication ([place])   . Dyspnea   . GERD (gastroesophageal reflux disease)   . Heart murmur   . Hypertension   . Liver disease   . NASH (nonalcoholic steatohepatitis) 09/16/2016  . Obstructive sleep apnea   . Osteomyelitis ([employer])   . Peripheral vascular disease ([place])   . Sleep apnea   .  Stroke ([place])   . Ulcers of both great toes ([place]) 11/07/2016   right great toe, started as finger nail size and grew in 6 days     Family History  Problem Relation Age of Onset  . Cancer Mother        Thymoma, metastatic  . Heart disease Father   . Stroke Father   . Stroke Maternal Grandfather   . Heart disease Paternal Grandfather   . Multiple sclerosis Sister   . Epilepsy Sister   . Diabetes Neg Hx     Past Surgical History:  Procedure Laterality Date  . ABDOMINAL HYSTERECTOMY    . AMPUTATION Bilateral 05/12/2017   Procedure: BILATERAL GREAT TOE AMPUTATION;  Surgeon: Newt Minion, MD;  Location: [facility];  Service: Orthopedics;  Laterality: Bilateral;  . BACK SURGERY    . CESAREAN SECTION    . CHOLECYSTECTOMY    . HERNIA REPAIR    . JOINT REPLACEMENT    . KNEE SURGERY    . SPINE SURGERY    . TOE AMPUTATION    . TOE AMPUTATION Bilateral    3 on right foot 2 left foot   . TONSILLECTOMY    . TUBAL LIGATION     Social History   Occupational History  . Not on file  Tobacco Use  . Smoking status: Never Smoker  . Smokeless tobacco: Never Used  Substance and Sexual Activity  . Alcohol use: No  . Drug use: No  . Sexual activity: Never

## 2017-06-03 ENCOUNTER — Telehealth (INDEPENDENT_AMBULATORY_CARE_PROVIDER_SITE_OTHER): Payer: Self-pay

## 2017-06-03 NOTE — Telephone Encounter (Signed)
Elizabeth RN with [facility] would like to know what type of wound care orders patient needs for Right Foot.  Cb# is [phone].  Please advise.  Thank you.

## 2017-06-07 ENCOUNTER — Telehealth (INDEPENDENT_AMBULATORY_CARE_PROVIDER_SITE_OTHER): Payer: Self-pay | Admitting: Orthopedic Surgery

## 2017-06-07 NOTE — Telephone Encounter (Signed)
At last office vist the pt was to wear medical grade compression socks. The pt is s/p bilateral GT amputations. I lm on vm to call with questions.

## 2017-06-07 NOTE — Telephone Encounter (Signed)
Tatiana-PT with [facility] called needing verbal orders for HHPT 1 wk 1 and 2 wk 4 for strenghtening, gait training and weight bearing status. The number to contact Dan [facility] is [phone]

## 2017-06-07 NOTE — Telephone Encounter (Signed)
I dont believe this message was sent - Benjamine Mola called to check on wound care orders .  Elizabeth RN with [facility] would like to know what type of wound care orders patient needs for Right Foot.  Cb# is [phone].  Please advise.  Thank you

## 2017-06-07 NOTE — Telephone Encounter (Signed)
Called and advised WTBAT and verbal ok to HHPT orders below.

## 2017-06-09 ENCOUNTER — Telehealth: Payer: Self-pay

## 2017-06-09 ENCOUNTER — Other Ambulatory Visit: Payer: Self-pay | Admitting: Family Medicine

## 2017-06-09 ENCOUNTER — Institutional Professional Consult (permissible substitution): Payer: Medicare HMO | Admitting: Neurology

## 2017-06-09 NOTE — Telephone Encounter (Signed)
Pt did not show for their appt with Dr. Athar today.  

## 2017-06-10 ENCOUNTER — Encounter (INDEPENDENT_AMBULATORY_CARE_PROVIDER_SITE_OTHER): Payer: Self-pay | Admitting: Orthopedic Surgery

## 2017-06-10 ENCOUNTER — Ambulatory Visit (INDEPENDENT_AMBULATORY_CARE_PROVIDER_SITE_OTHER): Payer: Medicare HMO | Admitting: Orthopedic Surgery

## 2017-06-10 DIAGNOSIS — Z89412 Acquired absence of left great toe: Secondary | ICD-10-CM

## 2017-06-10 DIAGNOSIS — Z89411 Acquired absence of right great toe: Secondary | ICD-10-CM

## 2017-06-10 NOTE — Progress Notes (Signed)
Office Visit Note   Patient: [name]           Date of Birth: [birth year]           MRN: [id number] Visit Date: 06/10/2017              Requested by: Forrest Moron, MD [place], [place] [address] PCP: Forrest Moron, MD  Chief Complaint  Patient presents with  . Right Foot - Routine Post Op  . Left Foot - Routine Post Op      HPI: Patient presents for follow-up status post amputation bilateral great toes.  Patient states that she wore her diabetic shoes and developed a blister on the left little toe.  Assessment & Plan: Visit Diagnoses:  1. History of amputation of right great toe ([place])   2. History of amputation of left great toe ([place])     Plan: We will give her 2 new postoperative shoes she will use regular socks and may begin weightbearing as tolerated.  Follow-Up Instructions: Return in about 1 month (around 07/08/2017).   Ortho Exam  Patient is alert, oriented, no adenopathy, well-dressed, normal affect, normal respiratory effort. Examination patient's incisions are well-healed there is no redness no cellulitis no drainage no signs of infection.  She is developed a new superficial blister over the little toe but there is intact epithelium.  Imaging: No results found. No images are attached to the encounter.  Labs: Lab Results  Component Value Date   HGBA1C 11.3 (H) 04/23/2017   HGBA1C 12.9 03/26/2017   HGBA1C 12.4 (H) 01/02/2017   ESRSEDRATE 39 (H) 12/05/2016   CRP 3.7 (H) 12/05/2016   REPTSTATUS 05/16/2017 FINAL 05/13/2017   CULT (A) 05/13/2017    50,000 COLONIES/mL ESCHERICHIA COLI 50,000 COLONIES/mL VANCOMYCIN RESISTANT ENTEROCOCCUS    LABORGA ESCHERICHIA COLI (A) 05/13/2017   LABORGA VANCOMYCIN RESISTANT ENTEROCOCCUS (A) 05/13/2017     Lab Results  Component Value Date   ALBUMIN 2.9 (L) 04/04/2017   ALBUMIN 3.9 03/26/2017   ALBUMIN 2.9 (L) 03/22/2017    There is no height or weight on file to calculate BMI.  Orders:  No  orders of the defined types were placed in this encounter.  No orders of the defined types were placed in this encounter.    Procedures: No procedures performed  Clinical Data: No additional findings.  ROS:  All other systems negative, except as noted in the HPI. Review of Systems  Objective: Vital Signs: There were no vitals taken for this visit.  Specialty Comments:  No specialty comments available.  PMFS History: Patient Active Problem List   Diagnosis Date Noted  . Acute encephalopathy 05/11/2017  . Osteomyelitis of great toe of right foot ([place]) 05/06/2017  . Osteomyelitis of great toe of left foot ([place]) 05/06/2017  . Community acquired pneumonia 04/04/2017  . Angina pectoris ([place]) 03/08/2017  . Aortic atherosclerosis ([place]) 01/13/2017  . At high risk for falls 01/13/2017  . Stroke-like symptoms   . Complicated migraine   . Mixed hyperlipidemia   . Generalized weakness   . TIA (transient ischemic attack) 01/02/2017  . Ulcer of toe of right foot, limited to breakdown of skin ([place]) 12/18/2016  . Non-pressure chronic ulcer of other part of right foot limited to breakdown of skin ([place]) 12/07/2016  . Diabetic foot ulcer ([place]) 12/04/2016  . Hypovolemia 12/04/2016  . Chronic pain syndrome 12/04/2016  . Diabetic peripheral neuropathy ([place]) 12/04/2016  . Hypotension   . Liver mass 09/20/2016  .  Kidney disease, chronic, stage III (GFR 30-59 ml/min) (HCC) 12/17/2015  . Diabetes ([place]) 07/18/2015  . Essential hypertension 06/06/2015  . Chronic back pain 07/27/2012  . OSA on CPAP 03/21/2012   Past Medical History:  Diagnosis Date  . Allergy   . Arthritis   . Chronic kidney disease   . Chronic pain   . Coronary artery disease   . Diabetes mellitus without complication ([place])   . Dyspnea   . GERD (gastroesophageal reflux disease)   . Heart murmur   . Hypertension   . Liver disease   . NASH (nonalcoholic steatohepatitis) 09/16/2016  . Obstructive sleep apnea   .  Osteomyelitis ([place])   . Peripheral vascular disease ([place])   . Sleep apnea   . Stroke ([place])   . Ulcers of both great toes ([place]) 11/07/2016   right great toe, started as finger nail size and grew in 6 days     Family History  Problem Relation Age of Onset  . Cancer Mother        Thymoma, metastatic  . Heart disease Father   . Stroke Father   . Stroke Maternal Grandfather   . Heart disease Paternal Grandfather   . Multiple sclerosis Sister   . Epilepsy Sister   . Diabetes Neg Hx     Past Surgical History:  Procedure Laterality Date  . ABDOMINAL HYSTERECTOMY    . AMPUTATION Bilateral 05/12/2017   Procedure: BILATERAL GREAT TOE AMPUTATION;  Surgeon: Newt Minion, MD;  Location: [facility];  Service: Orthopedics;  Laterality: Bilateral;  . BACK SURGERY    . CESAREAN SECTION    . CHOLECYSTECTOMY    . HERNIA REPAIR    . JOINT REPLACEMENT    . KNEE SURGERY    . SPINE SURGERY    . TOE AMPUTATION    . TOE AMPUTATION Bilateral    3 on right foot 2 left foot   . TONSILLECTOMY    . TUBAL LIGATION     Social History   Occupational History  . Not on file  Tobacco Use  . Smoking status: Never Smoker  . Smokeless tobacco: Never Used  Substance and Sexual Activity  . Alcohol use: No  . Drug use: No  . Sexual activity: Never

## 2017-06-11 ENCOUNTER — Encounter: Payer: Self-pay | Admitting: Neurology

## 2017-06-14 ENCOUNTER — Emergency Department (INDEPENDENT_AMBULATORY_CARE_PROVIDER_SITE_OTHER): Payer: Medicare HMO

## 2017-06-14 ENCOUNTER — Encounter: Payer: Self-pay | Admitting: Family Medicine

## 2017-06-14 ENCOUNTER — Emergency Department (HOSPITAL_COMMUNITY)
Admission: EM | Admit: 2017-06-14 | Discharge: 2017-06-14 | Discharge status: Against Medical Advice | Payer: Medicare HMO

## 2017-06-14 ENCOUNTER — Ambulatory Visit (INDEPENDENT_AMBULATORY_CARE_PROVIDER_SITE_OTHER): Payer: 59 | Admitting: Family Medicine

## 2017-06-14 VITALS — BP 108/64 | HR 98 | Temp 98.3°F | Ht 67.0 in | Wt 227.0 lb

## 2017-06-14 DIAGNOSIS — W19XXXA Unspecified fall, initial encounter: Secondary | ICD-10-CM

## 2017-06-14 DIAGNOSIS — M25562 Pain in left knee: Secondary | ICD-10-CM

## 2017-06-14 DIAGNOSIS — E1165 Type 2 diabetes mellitus with hyperglycemia: Secondary | ICD-10-CM

## 2017-06-14 DIAGNOSIS — N183 Chronic kidney disease, stage 3 (moderate): Secondary | ICD-10-CM

## 2017-06-14 DIAGNOSIS — Y92009 Unspecified place in unspecified non-institutional (private) residence as the place of occurrence of the external cause: Secondary | ICD-10-CM

## 2017-06-14 DIAGNOSIS — G546 Phantom limb syndrome with pain: Secondary | ICD-10-CM

## 2017-06-14 DIAGNOSIS — E1122 Type 2 diabetes mellitus with diabetic chronic kidney disease: Secondary | ICD-10-CM

## 2017-06-14 NOTE — Patient Instructions (Addendum)
     IF you received an x-ray today, you will receive an invoice from [facility]. Please contact [facility] at [phone] with questions or concerns regarding your invoice.   IF you received labwork today, you will receive an invoice from [facility]. Please contact [facility] at [phone] with questions or concerns regarding your invoice.   Our billing staff will not be able to assist you with questions regarding bills from these companies.  You will be contacted with the lab results as soon as they are available. The fastest way to get your results is to activate your My Chart account. Instructions are located on the last page of this paperwork. If you have not heard from [place] regarding the results in 2 weeks, please contact this office.    Phantom Limb Pain Phantom limb pain occurs in an arm or leg following an amputation. It is pain in an extremity that no longer exists. This pain varies with different patients. Different activities may cause the pain. Some people with an amputated limb experience the opposite of phantom pain, which is phantom pleasure. The trouble may start in a part of the brain known as the sensory cortex. The sensory cortex is the portion of your brain that processes sensations from the rest of your body. It is hypothesized that when a body part is lost, the corresponding part of the brain is not able to handle the loss and rewires its circuitry to make up for the signals it no longer receives from the missing extremity. The exact mechanism of how phantom limb pain occurs is not known. The severity of pain seems to be correlated with personal problems such as stress and attitude. It also seems to correlate with the amount of pain a person had before the operation. What are the causes?  Damaged nerve endings.  Scar tissue at the amputation site. How is this treated? Phantom limb pain can be severe and debilitating. Most cases of phantom limb pain only  last briefly. There are a number of different therapies and medications that may give relief. Keep working with your health care provider until relief is obtained. Some treatments that may be helpful include:  Hypnosis and mental imagery. Their techniques can give patients the needed impetus to recognize their ability to regain control.  Biofeedback.  Relaxation techniques. They are related to hypnosis techniques and use the mind and body to control pain.  Acupuncture.  Massage.  Exercise.  Antidepressant medicine.  Anticonvulsant medicine.  Narcotics or pain medicines.  Contact a health care provider if: Pain is not relieved or increases. This information is not intended to replace advice given to you by your health care provider. Make sure you discuss any questions you have with your health care provider. Document Released: 04/11/2002 Document Revised: 10/01/2015 Document Reviewed: 06/21/2012 Elsevier Interactive Patient Education  2017 [employer].

## 2017-06-14 NOTE — Progress Notes (Signed)
Chief Complaint  Patient presents with  . Neck Pain    fell yesterday morning and hurt the left knee    HPI Phantom Limb Pain Patient is s/p great toe and amputations due to osteomyelitis She also had the 2nd toe on the left amputate a year ago She had osteomyelitis of the right great toe  She reports that she was having pain in her toes from her diabetes and still gets the the same pain.  Fall at home She reports that she fell yesterday morning She was going into the bathroom  She states that she fell onto her left knee and now has pain in the knee with bruising and swelling  She currently cannot weight bear They went to [facility] and there was a 6-9 hour weight in the ER She came in for xray and evaluation She has not iced the left knee   Diabetes- uncontrolled Pt reports that she has been having better sugars Her fasting glucose typically is 200-215 She reports that at blood glucose of 140 she had hypoglycemic symptoms She did 3 weeks at [facility] She states that she rarely needs correction insulin which she was instructed to take for blood glucose 300 or higher Lab Results  Component Value Date   HGBA1C 9.2 (A) 07/27/2017       Past Medical History:  Diagnosis Date  . Allergy   . Arthritis   . Chronic kidney disease   . Chronic pain   . Coronary artery disease   . Diabetes mellitus without complication ([place])   . Dyspnea   . GERD (gastroesophageal reflux disease)   . Heart murmur   . Hypertension   . Liver disease   . NASH (nonalcoholic steatohepatitis) 09/16/2016  . Obstructive sleep apnea   . Osteomyelitis ([place])   . Peripheral vascular disease ([place])   . Sleep apnea   . Stroke ([place])   . Ulcers of both great toes ([place]) 11/07/2016   right great toe, started as finger nail size and grew in 6 days     Current Outpatient Medications  Medication Sig Dispense Refill  . atorvastatin (LIPITOR) 80 MG tablet Take 1 tablet (80 mg total) by  mouth daily at 6 PM. 90 tablet 3  . bisacodyl (DULCOLAX) 10 MG suppository Place 1 suppository (10 mg total) rectally daily as needed for moderate constipation. 12 suppository 0  . clopidogrel (PLAVIX) 75 MG tablet Take 1 tablet (75 mg total) by mouth daily. 90 tablet 3  . diclofenac sodium (VOLTAREN) 1 % GEL Apply 2 g topically as needed (muscle spasms).    . docusate sodium (COLACE) 100 MG capsule Take 1 capsule (100 mg total) by mouth 2 (two) times daily. 10 capsule 0  . ezetimibe (ZETIA) 10 MG tablet Take 1 tablet (10 mg total) by mouth daily. 90 tablet 3  . gabapentin (NEURONTIN) 300 MG capsule Take 1 capsule (300 mg total) by mouth 2 (two) times daily.    . insulin aspart (NOVOLOG) 100 UNIT/ML FlexPen Inject 10 Units into the skin 3 (three) times daily before meals. Take only if blood sugar is over 300 15 mL 3  . INSULIN SYRINGE 1CC/29G (B-D INS SYR ULTRAFINE 1CC/29G) 29G X 1/2" 1 ML MISC Used to give insulin injections 4x daily. 200 each 11  . methocarbamol (ROBAXIN) 750 MG tablet Take 1 tablet (750 mg total) by mouth every 6 (six) hours as needed for muscle spasms. 14 tablet 0  . morphine (MS  CONTIN) 30 MG 12 hr tablet Take 1 tablet (30 mg total) by mouth every 8 (eight) hours as needed. 8 tablet 0  . morphine (MSIR) 15 MG tablet Take 1 tablet (15 mg total) by mouth every 8 (eight) hours as needed for moderate pain. 10 tablet 0  . polyethylene glycol (MIRALAX / GLYCOLAX) packet Take 17 g by mouth daily as needed for mild constipation. 14 each 0  . ranolazine (RANEXA) 1000 MG SR tablet Take 1 tablet (1,000 mg total) by mouth 2 (two) times daily. 180 tablet 3  . busPIRone (BUSPAR) 15 MG tablet Take 1 tablet (15 mg total) by mouth 3 (three) times daily. 270 tablet 3  . carvedilol (COREG) 25 MG tablet TAKE 1 TABLET BY MOUTH TWICE DAILY 180 tablet 0  . exenatide (BYETTA 5 MCG PEN) 5 MCG/0.02ML SOPN injection Inject 0.02 milliliters (18mg total) subcutaneously twice a day with meals 3.6 mL 0  .  Insulin NPH, Human,, Isophane, (HUMULIN N KWIKPEN) 100 UNIT/ML Kiwkpen Inject 220 Units into the skin daily before lunch. And pen needles 4/day.. 30 pen 11  . Insulin Pen Needle (BD ULTRA-FINE PEN NEEDLES) 29G X 12.7MM MISC USE AS DIRECTED 200 each 0  . sertraline (ZOLOFT) 50 MG tablet Take 1 tablet (50 mg total) by mouth daily. 90 tablet 3   No current facility-administered medications for this visit.     Allergies:  Allergies  Allergen Reactions  . Furosemide Other (See Comments)    Dehydration leading to kidney failure  "my kidneys shutdown"  . Norvasc [Amlodipine Besylate] Swelling    "Severe edema"  . Prednisone Other (See Comments)    Ketoacidosis  . Vancomycin Other (See Comments)    Total organ shut down "It shut my kidneys down"  . Amlodipine Swelling    SWELLING/EDEMA REACTION UNSPECIFIED   . Clindamycin/Lincomycin Hives and Itching  . Doxycycline Hives and Rash  . Propranolol Cough    Bronchospasms  . Ibuprofen Other (See Comments)    WAS TOLD TO NOT TAKE THIS BECAUSE OF HER KIDNEYS AND LIVER  . Lisinopril Other (See Comments)    WAS TOLD TO NOT TAKE THIS BECAUSE OF HER KIDNEYS AND LIVER  . Lovastatin Other (See Comments)    WAS TOLD TO NOT TAKE THIS BECAUSE OF HER KIDNEYS AND LIVER  . Norvasc [Amlodipine Besylate] Other (See Comments)    Severe edema  . Nsaids Other (See Comments)    WAS TOLD TO NOT TAKE THIS BECAUSE OF HER KIDNEYS AND LIVER  . Sulfa Antibiotics Nausea Only    Past Surgical History:  Procedure Laterality Date  . ABDOMINAL HYSTERECTOMY    . AMPUTATION Bilateral 05/12/2017   Procedure: BILATERAL GREAT TOE AMPUTATION;  Surgeon: DNewt Minion MD;  [facility]  Service: Orthopedics;  Laterality: Bilateral;  . BACK SURGERY    . CESAREAN SECTION    . CHOLECYSTECTOMY    . HERNIA REPAIR    . JOINT REPLACEMENT    . KNEE SURGERY    . SPINE SURGERY    . TOE AMPUTATION    . TOE AMPUTATION Bilateral    3 on right foot 2 left foot   .  TONSILLECTOMY    . TUBAL LIGATION      Social History   Socioeconomic History  . Marital status: Divorced    Spouse name: Not on file  . Number of children: Not on file  . Years of education: Not on file  . Highest education level: Not on  file  Occupational History  . Not on file  Social Needs  . Financial resource strain: Not on file  . Food insecurity:    Worry: Not on file    Inability: Not on file  . Transportation needs:    Medical: Not on file    Non-medical: Not on file  Tobacco Use  . Smoking status: Never Smoker  . Smokeless tobacco: Never Used  Substance and Sexual Activity  . Alcohol use: No  . Drug use: No  . Sexual activity: Never  Lifestyle  . Physical activity:    Days per week: Not on file    Minutes per session: Not on file  . Stress: Not on file  Relationships  . Social connections:    Talks on phone: Not on file    Gets together: Not on file    Attends religious service: Not on file    Active member of club or organization: Not on file    Attends meetings of clubs or organizations: Not on file    Relationship status: Not on file  Other Topics Concern  . Not on file  Social History Narrative   ** Merged History Encounter **        Family History  Problem Relation Age of Onset  . Cancer Mother        Thymoma, metastatic  . Heart disease Father   . Stroke Father   . Stroke Maternal Grandfather   . Heart disease Paternal Grandfather   . Multiple sclerosis Sister   . Epilepsy Sister   . Diabetes Neg Hx      ROS Review of Systems See HPI Constitution: No fevers or chills No malaise No diaphoresis Skin: No rash or itching Eyes: no blurry vision, no double vision GU: no dysuria or hematuria Neuro: no dizziness or headaches * all others reviewed and negative   Objective: Vitals:   06/14/17 1552  BP: 108/64  Pulse: 98  Temp: 98.3 F (36.8 C)  TempSrc: Oral  SpO2: 94%  Weight: 227 lb (103 kg)  Height: 5' 7"  (1.702 m)     Physical Exam  Constitutional: She is oriented to person, place, and time. She appears well-developed and well-nourished.  HENT:  Head: Normocephalic and atraumatic.  Eyes: Conjunctivae and EOM are normal.  Neck: Normal range of motion. Neck supple.  Cardiovascular: Normal rate and regular rhythm.  No murmur heard. Pulmonary/Chest: Effort normal and breath sounds normal. No stridor. No respiratory distress. She has no wheezes.  Neurological: She is alert and oriented to person, place, and time.  Psychiatric: She has a normal mood and affect. Her behavior is normal. Judgment and thought content normal.    Knee - left Left knee with tenderness over the patellar tendon  Ecchymosis at the medial aspect of the knee Tender along the medial joint line Mild effusion noted No crepitus noted  Previous scars on both feet Right foot with well healed scar  Left foot with 3 remaining toes (3rd, 4th, 5th toes)  COMPARISON:  Plain film of the LEFT knee dated 04/23/2017.  FINDINGS: Tricompartmental degenerative changes, mild to moderate in degree, again most significantly involving the medial and patellofemoral compartments with associated joint space narrowings and osseous spurring.  Osseous alignment is stable. No fracture line or displaced fracture fragment seen. No acute or suspicious osseous lesion. No appreciable joint effusion and adjacent soft tissues are unremarkable.  IMPRESSION: 1. No acute findings.  No osseous fracture or dislocation. 2. 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   Electronically Signed   By: Franki Cabot M.D.   On: 06/14/2017 16:56  Assessment and Plan [name] was seen today for neck pain.  Diagnoses and all orders for this visit:  Acute pain of left knee- no fracture, discussed ice and elevation Avoid NSAIDs due to CKD -     DG Knee Complete 4 Views Left;  Future  Fall in home, initial encounter-  No fractures noted Discussed that she likely has muscle contusions and discussed ice and elevation -     DG Knee Complete 4 Views Left; Future  Phantom limb pain ([place])- continue gabapentin  Uncontrolled type 2 diabetes mellitus with hyperglycemia ([place]) -     Basic metabolic panel -  Continue with Endocrinology Pt with improved medicine and dietary compliance Will monitor renal function   CKD stage 3 due to type 2 diabetes mellitus ([place]) -     Basic metabolic panel -  Will check renal function today Discussed meds that are nephrotoxic Discussed hydration with water instead of sugary drinks which patient is willing to continue to work on   Follow up in 3 months for diabetes and ckd   A total of 45 minutes were spent face-to-face with the patient during this encounter and over half of that time was spent on counseling and coordination of care.  [place]

## 2017-06-14 NOTE — ED Notes (Signed)
Pt stated that are leaving

## 2017-06-15 ENCOUNTER — Telehealth: Payer: Self-pay | Admitting: Family Medicine

## 2017-06-15 ENCOUNTER — Ambulatory Visit: Payer: Self-pay

## 2017-06-15 ENCOUNTER — Ambulatory Visit: Payer: Self-pay | Admitting: Physician Assistant

## 2017-06-15 LAB — BASIC METABOLIC PANEL
BUN/Creatinine Ratio: 13 (ref 12–28)
BUN: 21 mg/dL (ref 8–27)
CALCIUM: 8.7 mg/dL (ref 8.7–10.3)
CO2: 21 mmol/L (ref 20–29)
CREATININE: 1.62 mg/dL — AB (ref 0.57–1.00)
Chloride: 103 mmol/L (ref 96–106)
GFR, EST AFRICAN AMERICAN: 39 mL/min/{1.73_m2} — AB (ref >59)
GFR, EST NON AFRICAN AMERICAN: 34 mL/min/{1.73_m2} — AB (ref >59)
Glucose: 191 mg/dL — ABNORMAL HIGH (ref 65–99)
Potassium: 4.8 mmol/L (ref 3.5–5.2)
Sodium: 140 mmol/L (ref 134–144)

## 2017-06-15 NOTE — Telephone Encounter (Signed)
Please let the patient and her daughter know that the patient needs to use ice on the knee to help pain and swelling.  She can wear a brace but it will not be as helping as icing the knee

## 2017-06-15 NOTE — Telephone Encounter (Signed)
Advised via mychart to allow 2 weeks for review.

## 2017-06-15 NOTE — Telephone Encounter (Signed)
Copied from Mariano Colon [id number]. Topic: Quick Communication - See Telephone Encounter >> Jun 15, 2017  8:42 AM [place], Laban Emperor wrote: CRM for notification. See Telephone encounter for: 06/15/17.  Pt daughter [name] calling for labs and x-ray results.

## 2017-06-15 NOTE — Telephone Encounter (Signed)
Returned call to pt. Spoke with daughter.State that here Moms right leg is swollen and has a re spot.   Started last night swelling.  Pain is rated 4/10. States pt. Feels warm but hasn't taken her temp.    Has SOB when going to the bathroom. scheduled pt. For an appoint at [facility] for [place]. Pt. Voiced understanding. Reason for Disposition . [1] Skin around the wound has become red AND [2] larger than 2 inches (5 cm)  Answer Assessment - Initial Assessment Questions 1. LOCATION: "Where is the wound located?"      Last night 2. WOUND APPEARANCE: "What does the wound look like?"      Knee/foot3. SIZE: If redness is present, ask: "What is the size of the red area?" (Inches, centimeters, or compare to size of a coin)      moderate 4. SPREAD: "What's changed in the last day?"  "Do you see any red streaks coming from the wound?"     Extremely red 5. ONSET: "When did it start to look infected?"      Last night 6. MECHANISM: "How did the wound start, what was the cause?"     7. PAIN: "Is there any pain?" If so, ask: "How bad is the pain?"   (Scale 1-10; or mild, moderate, severe)     **4/10 8. FEVER: "Do you have a fever?" If so, ask: "What is your temperature, how was it measured, and when did it start?"   Felt warm 9. OTHER SYMPTOMS: "Do you have any other symptoms?" (e.g., shaking chills, weakness, rash elsewhere on body)     *No Answer*sob after using bathroom 10. PREGNANCY: "Is there any chance you are pregnant?" "When was your last menstrual period?"       *No Answer*na  Protocols used: WOUND INFECTION-A-AH

## 2017-06-15 NOTE — Telephone Encounter (Signed)
Pts daughter calling back and states they would prefer to not wait 2 weeks if possible. They would like to know the results asap due to her moms health history and to see if she needs to get the leg brace. Her right leg now has some edema and redness.

## 2017-06-16 NOTE — Telephone Encounter (Signed)
Called Pt, LMOVM with Dr. Nolon Rod message.  Note: pt seen in ED last evening for leg swelling.

## 2017-06-23 ENCOUNTER — Encounter (INDEPENDENT_AMBULATORY_CARE_PROVIDER_SITE_OTHER): Payer: Self-pay | Admitting: Family

## 2017-06-23 ENCOUNTER — Ambulatory Visit (INDEPENDENT_AMBULATORY_CARE_PROVIDER_SITE_OTHER): Payer: Medicare HMO | Admitting: Family

## 2017-06-23 DIAGNOSIS — I872 Venous insufficiency (chronic) (peripheral): Secondary | ICD-10-CM | POA: Insufficient documentation

## 2017-06-23 DIAGNOSIS — Z89411 Acquired absence of right great toe: Secondary | ICD-10-CM | POA: Diagnosis not present

## 2017-06-23 NOTE — Progress Notes (Signed)
Office Visit Note   Patient: [name]           Date of Birth: 20-Dec-1955           MRN: [id number] Visit Date: 06/23/2017              Requested by: Forrest Moron, MD [place], [place] [address] PCP: Forrest Moron, MD  No chief complaint on file.     HPI: Patient presents for follow-up status post amputation bilateral great toes. Had a visit to urgent care for concern of cellulitis to the RLE 1 week ago. Was treated with IM rocephin and given po Kelfex. Has swelling to bilateral lower extremities. Some pain to distal right first MT.   Has been advised to wear compression in past. Has been unable to afford these.   Assessment & Plan: Visit Diagnoses:  No diagnosis found.  Plan: Placed in a Dynaflex wrap on right. Will follow up in 1 week. Discussed warning and return signs and symptoms. Complete Keflex as prescribed.   Follow-Up Instructions: No follow-ups on file.   Ortho Exam  Patient is alert, oriented, no adenopathy, well-dressed, normal affect, normal respiratory effort. Examination patient's incisions are well-healed. Has 2+ pitting edema to RLE. Some dependent erythema to distal right foot.  there is no redness no cellulitis no drainage no signs of infection.    Imaging: No results found. No images are attached to the encounter.  Labs: Lab Results  Component Value Date   HGBA1C 11.3 (H) 04/23/2017   HGBA1C 12.9 03/26/2017   HGBA1C 12.4 (H) 01/02/2017   ESRSEDRATE 39 (H) 12/05/2016   CRP 3.7 (H) 12/05/2016   REPTSTATUS 05/16/2017 FINAL 05/13/2017   CULT (A) 05/13/2017    50,000 COLONIES/mL ESCHERICHIA COLI 50,000 COLONIES/mL VANCOMYCIN RESISTANT ENTEROCOCCUS    LABORGA ESCHERICHIA COLI (A) 05/13/2017   LABORGA VANCOMYCIN RESISTANT ENTEROCOCCUS (A) 05/13/2017     Lab Results  Component Value Date   ALBUMIN 2.9 (L) 04/04/2017   ALBUMIN 3.9 03/26/2017   ALBUMIN 2.9 (L) 03/22/2017    There is no height or weight on file to  calculate BMI.  Orders:  No orders of the defined types were placed in this encounter.  No orders of the defined types were placed in this encounter.    Procedures: No procedures performed  Clinical Data: No additional findings.  ROS:  All other systems negative, except as noted in the HPI. Review of Systems  Constitutional: Negative for chills and fever.    Objective: Vital Signs: There were no vitals taken for this visit.  Specialty Comments:  No specialty comments available.  PMFS History: Patient Active Problem List   Diagnosis Date Noted  . Phantom limb pain ([place]) 06/14/2017  . Acute encephalopathy 05/11/2017  . Osteomyelitis of great toe of right foot ([place]) 05/06/2017  . Osteomyelitis of great toe of left foot ([place]) 05/06/2017  . Community acquired pneumonia 04/04/2017  . Angina pectoris ([place]) 03/08/2017  . Aortic atherosclerosis ([place]) 01/13/2017  . At high risk for falls 01/13/2017  . Stroke-like symptoms   . Complicated migraine   . Mixed hyperlipidemia   . Generalized weakness   . TIA (transient ischemic attack) 01/02/2017  . Ulcer of toe of right foot, limited to breakdown of skin ([place]) 12/18/2016  . Non-pressure chronic ulcer of other part of right foot limited to breakdown of skin ([place]) 12/07/2016  . Diabetic foot ulcer ([place]) 12/04/2016  . Hypovolemia 12/04/2016  . Chronic pain syndrome  12/04/2016  . Diabetic peripheral neuropathy ([place]) 12/04/2016  . Hypotension   . Liver mass 09/20/2016  . Kidney disease, chronic, stage III (GFR 30-59 ml/min) ([facility]) 12/17/2015  . Diabetes ([place]) 07/18/2015  . Essential hypertension 06/06/2015  . Chronic back pain 07/27/2012  . OSA on CPAP 03/21/2012   Past Medical History:  Diagnosis Date  . Allergy   . Arthritis   . Chronic kidney disease   . Chronic pain   . Coronary artery disease   . Diabetes mellitus without complication ([place])   . Dyspnea   . GERD (gastroesophageal reflux disease)   . Heart murmur     . Hypertension   . Liver disease   . NASH (nonalcoholic steatohepatitis) 09/16/2016  . Obstructive sleep apnea   . Osteomyelitis ([place])   . Peripheral vascular disease ([place])   . Sleep apnea   . Stroke ([place])   . Ulcers of both great toes ([place]) 11/07/2016   right great toe, started as finger nail size and grew in 6 days     Family History  Problem Relation Age of Onset  . Cancer Mother        Thymoma, metastatic  . Heart disease Father   . Stroke Father   . Stroke Maternal Grandfather   . Heart disease Paternal Grandfather   . Multiple sclerosis Sister   . Epilepsy Sister   . Diabetes Neg Hx     Past Surgical History:  Procedure Laterality Date  . ABDOMINAL HYSTERECTOMY    . AMPUTATION Bilateral 05/12/2017   Procedure: BILATERAL GREAT TOE AMPUTATION;  Surgeon: Newt Minion, MD;  Location: [facility];  Service: Orthopedics;  Laterality: Bilateral;  . BACK SURGERY    . CESAREAN SECTION    . CHOLECYSTECTOMY    . HERNIA REPAIR    . JOINT REPLACEMENT    . KNEE SURGERY    . SPINE SURGERY    . TOE AMPUTATION    . TOE AMPUTATION Bilateral    3 on right foot 2 left foot   . TONSILLECTOMY    . TUBAL LIGATION     Social History   Occupational History  . Not on file  Tobacco Use  . Smoking status: Never Smoker  . Smokeless tobacco: Never Used  Substance and Sexual Activity  . Alcohol use: No  . Drug use: No  . Sexual activity: Never

## 2017-06-24 MED ORDER — SODIUM CHLORIDE 0.9 % IV SOLN
10.00 | INTRAVENOUS | Status: DC
Start: ? — End: 2017-06-24

## 2017-06-24 MED ORDER — GENERIC EXTERNAL MEDICATION
Status: DC
Start: ? — End: 2017-06-24

## 2017-06-29 ENCOUNTER — Telehealth (INDEPENDENT_AMBULATORY_CARE_PROVIDER_SITE_OTHER): Payer: Self-pay | Admitting: Radiology

## 2017-06-29 NOTE — Telephone Encounter (Signed)
I called to advised patient that per Autumn, she can cut off the dynaflex, but she would have to come back in today and have them put back on, she states that she was able to get he shower and it was a task but that she would keep her appt for 5/29 @1230 .

## 2017-06-29 NOTE — Telephone Encounter (Signed)
Dyna flex is falling off patient wants to know if she can cut it off and take a shower today.  Please call her to advise.

## 2017-06-29 NOTE — Telephone Encounter (Signed)
Noted  

## 2017-06-30 ENCOUNTER — Ambulatory Visit (INDEPENDENT_AMBULATORY_CARE_PROVIDER_SITE_OTHER): Payer: Medicare HMO | Admitting: Family

## 2017-06-30 ENCOUNTER — Telehealth (INDEPENDENT_AMBULATORY_CARE_PROVIDER_SITE_OTHER): Payer: Self-pay | Admitting: Family

## 2017-06-30 NOTE — Telephone Encounter (Signed)
Patients daughter [name] called to reschedule patients appointment today due to her taking pain medication and unable to drive patient to appt. Advised per Autumn and triage they were responsible for patient if anything happens within the day they had to r/s. Patient will come in for 10 a.m. appt tomorrow

## 2017-07-01 ENCOUNTER — Ambulatory Visit (INDEPENDENT_AMBULATORY_CARE_PROVIDER_SITE_OTHER): Payer: Medicare HMO | Admitting: Orthopedic Surgery

## 2017-07-01 ENCOUNTER — Encounter (INDEPENDENT_AMBULATORY_CARE_PROVIDER_SITE_OTHER): Payer: Self-pay | Admitting: Orthopedic Surgery

## 2017-07-01 VITALS — Ht 67.0 in | Wt 227.0 lb

## 2017-07-01 DIAGNOSIS — I872 Venous insufficiency (chronic) (peripheral): Secondary | ICD-10-CM

## 2017-07-01 DIAGNOSIS — Z89411 Acquired absence of right great toe: Secondary | ICD-10-CM

## 2017-07-01 DIAGNOSIS — Z89412 Acquired absence of left great toe: Secondary | ICD-10-CM

## 2017-07-01 NOTE — Progress Notes (Signed)
Office Visit Note   Patient: [name]           Date of Birth: 02/20/1955           MRN: [id number] Visit Date: 07/01/2017              Requested by: Forrest Moron, MD [place],  [id number] PCP: Forrest Moron, MD  Chief Complaint  Patient presents with  . Left Foot - Routine Post Op    05/2016 bilat great toe amputation   . Right Foot - Routine Post Op      HPI: Patient presents in follow-up she is 50 days status post amputation bilateral great toes.  She had a Dynaflex wrap on the right lower extremity she states that this did slide down.  Patient states that she fell when a dog ran through her legs she states she has a hairline crack of her clavicle and had a closed head injury.  Assessment & Plan: Visit Diagnoses:  1. Edema of right lower extremity due to peripheral venous insufficiency   2. History of amputation of right great toe ([place])   3. History of amputation of left great toe (HCC)     Plan: Recommended knee-high compression stockings discussed that at a minimum TED hose would be appropriate but would prefer her being in a medical compression stocking.  Recommended exercise swimming elevation.  Follow-Up Instructions: Return in about 2 months (around 08/31/2017).   Ortho Exam  Patient is alert, oriented, no adenopathy, well-dressed, normal affect, normal respiratory effort. Examination patient's feet are plantigrade she has venous stasis swelling there is no ulcers no cellulitis no signs of infection.  Imaging: No results found. No images are attached to the encounter.  Labs: Lab Results  Component Value Date   HGBA1C 11.3 (H) 04/23/2017   HGBA1C 12.9 03/26/2017   HGBA1C 12.4 (H) 01/02/2017   ESRSEDRATE 39 (H) 12/05/2016   CRP 3.7 (H) 12/05/2016   REPTSTATUS 05/16/2017 FINAL 05/13/2017   CULT (A) 05/13/2017    50,000 COLONIES/mL ESCHERICHIA COLI 50,000 COLONIES/mL VANCOMYCIN RESISTANT ENTEROCOCCUS    LABORGA ESCHERICHIA COLI  (A) 05/13/2017   LABORGA VANCOMYCIN RESISTANT ENTEROCOCCUS (A) 05/13/2017     Lab Results  Component Value Date   ALBUMIN 2.9 (L) 04/04/2017   ALBUMIN 3.9 03/26/2017   ALBUMIN 2.9 (L) 03/22/2017    Body mass index is 35.55 kg/m.  Orders:  No orders of the defined types were placed in this encounter.  No orders of the defined types were placed in this encounter.    Procedures: No procedures performed  Clinical Data: No additional findings.  ROS:  All other systems negative, except as noted in the HPI. Review of Systems  Objective: Vital Signs: Ht 5' 7"  (1.702 m)   Wt 227 lb (103 kg)   BMI 35.55 kg/m   Specialty Comments:  No specialty comments available.  PMFS History: Patient Active Problem List   Diagnosis Date Noted  . History of amputation of right great toe ([place]) 06/23/2017  . Edema of right lower extremity due to peripheral venous insufficiency 06/23/2017  . Phantom limb pain ([place]) 06/14/2017  . Acute encephalopathy 05/11/2017  . Osteomyelitis of great toe of right foot ([place]) 05/06/2017  . Osteomyelitis of great toe of left foot ([place]) 05/06/2017  . Community acquired pneumonia 04/04/2017  . Angina pectoris ([place]) 03/08/2017  . Aortic atherosclerosis ([place]) 01/13/2017  . At high risk for falls 01/13/2017  . Stroke-like symptoms   .  Complicated migraine   . Mixed hyperlipidemia   . Generalized weakness   . TIA (transient ischemic attack) 01/02/2017  . Ulcer of toe of right foot, limited to breakdown of skin ([place]) 12/18/2016  . Non-pressure chronic ulcer of other part of right foot limited to breakdown of skin ([place]) 12/07/2016  . Diabetic foot ulcer ([place]) 12/04/2016  . Hypovolemia 12/04/2016  . Chronic pain syndrome 12/04/2016  . Diabetic peripheral neuropathy ([place]) 12/04/2016  . Hypotension   . Liver mass 09/20/2016  . Kidney disease, chronic, stage III (GFR 30-59 ml/min) (HCC) 12/17/2015  . Diabetes ([place]) 07/18/2015  . Essential hypertension  06/06/2015  . Chronic back pain 07/27/2012  . OSA on CPAP 03/21/2012   Past Medical History:  Diagnosis Date  . Allergy   . Arthritis   . Chronic kidney disease   . Chronic pain   . Coronary artery disease   . Diabetes mellitus without complication ([place])   . Dyspnea   . GERD (gastroesophageal reflux disease)   . Heart murmur   . Hypertension   . Liver disease   . NASH (nonalcoholic steatohepatitis) 09/16/2016  . Obstructive sleep apnea   . Osteomyelitis ([place])   . Peripheral vascular disease ([place])   . Sleep apnea   . Stroke ([place])   . Ulcers of both great toes ([place]) 11/07/2016   right great toe, started as finger nail size and grew in 6 days     Family History  Problem Relation Age of Onset  . Cancer Mother        Thymoma, metastatic  . Heart disease Father   . Stroke Father   . Stroke Maternal Grandfather   . Heart disease Paternal Grandfather   . Multiple sclerosis Sister   . Epilepsy Sister   . Diabetes Neg Hx     Past Surgical History:  Procedure Laterality Date  . ABDOMINAL HYSTERECTOMY    . AMPUTATION Bilateral 05/12/2017   Procedure: BILATERAL GREAT TOE AMPUTATION;  Surgeon: Newt Minion, MD;  Location: [facility];  Service: Orthopedics;  Laterality: Bilateral;  . BACK SURGERY    . CESAREAN SECTION    . CHOLECYSTECTOMY    . HERNIA REPAIR    . JOINT REPLACEMENT    . KNEE SURGERY    . SPINE SURGERY    . TOE AMPUTATION    . TOE AMPUTATION Bilateral    3 on right foot 2 left foot   . TONSILLECTOMY    . TUBAL LIGATION     Social History   Occupational History  . Not on file  Tobacco Use  . Smoking status: Never Smoker  . Smokeless tobacco: Never Used  Substance and Sexual Activity  . Alcohol use: No  . Drug use: No  . Sexual activity: Never

## 2017-07-05 ENCOUNTER — Other Ambulatory Visit: Payer: Self-pay | Admitting: Family Medicine

## 2017-07-06 NOTE — Telephone Encounter (Signed)
Dr. Nolon Rod please advise. Dgaddy, CMA

## 2017-07-08 ENCOUNTER — Ambulatory Visit (INDEPENDENT_AMBULATORY_CARE_PROVIDER_SITE_OTHER): Payer: Medicare HMO | Admitting: Orthopedic Surgery

## 2017-07-15 ENCOUNTER — Other Ambulatory Visit: Payer: Self-pay | Admitting: Family Medicine

## 2017-07-15 NOTE — Telephone Encounter (Signed)
Copied from [place] [id number]. Topic: Quick Communication - Rx Refill/Question >> Jul 15, 2017  5:26 PM Marin Olp L wrote: Medication: zoloft (out completely)  Has the patient contacted their pharmacy? Yes.   (Agent: If no, request that the patient contact the pharmacy for the refill.) (Agent: If yes, when and what did the pharmacy advise?)  Preferred Pharmacy (with phone number or street name): [facility] [place] [place] [address] Phone: [phone] Fax: [phone]  Agent: Please be advised that RX refills may take up to 3 business days. We ask that you follow-up with your pharmacy.  Almost out of Buspar and carvedilol. (Pt says the pharmacy forgot to request the zoloft) She is being treated for a stroke and her bp is rising so she needs her scripts as soon as possible.

## 2017-07-15 NOTE — Telephone Encounter (Signed)
Please advise. Dgaddy, CMA 

## 2017-07-15 NOTE — Telephone Encounter (Signed)
Buspar refill Last Refill:06/10/17 #270 Last OV: 02/24/17 PCP: Delia Chimes MD Pharmacy:[facility]  carvedilol refill Last Refill:11/23/16 # 180 1 RF Last OV: 03/29/17

## 2017-07-16 ENCOUNTER — Other Ambulatory Visit: Payer: Self-pay | Admitting: Family Medicine

## 2017-07-16 ENCOUNTER — Telehealth: Payer: Self-pay | Admitting: Family Medicine

## 2017-07-16 MED ORDER — GABAPENTIN 300 MG PO CAPS
300.00 | ORAL_CAPSULE | ORAL | Status: DC
Start: 2017-07-14 — End: 2017-07-16

## 2017-07-16 MED ORDER — HEPARIN SODIUM (PORCINE) 5000 UNIT/ML IJ SOLN
5000.00 | INTRAMUSCULAR | Status: DC
Start: 2017-07-14 — End: 2017-07-16

## 2017-07-16 MED ORDER — CLONIDINE HCL 0.1 MG PO TABS
0.10 | ORAL_TABLET | ORAL | Status: DC
Start: ? — End: 2017-07-16

## 2017-07-16 MED ORDER — GENERIC EXTERNAL MEDICATION
Status: DC
Start: ? — End: 2017-07-16

## 2017-07-16 MED ORDER — POLYETHYLENE GLYCOL 3350 17 G PO PACK
17.00 | PACK | ORAL | Status: DC
Start: ? — End: 2017-07-16

## 2017-07-16 MED ORDER — HYDRALAZINE HCL 20 MG/ML IJ SOLN
10.00 | INTRAMUSCULAR | Status: DC
Start: ? — End: 2017-07-16

## 2017-07-16 MED ORDER — TIZANIDINE HCL 4 MG PO TABS
4.00 | ORAL_TABLET | ORAL | Status: DC
Start: 2017-07-14 — End: 2017-07-16

## 2017-07-16 MED ORDER — GENERIC EXTERNAL MEDICATION
15.00 | Status: DC
Start: 2017-07-14 — End: 2017-07-16

## 2017-07-16 MED ORDER — SODIUM CHLORIDE 0.9 % IV SOLN
75.00 | INTRAVENOUS | Status: DC
Start: ? — End: 2017-07-16

## 2017-07-16 MED ORDER — GENERIC EXTERNAL MEDICATION
3.38 | Status: DC
Start: 2017-07-14 — End: 2017-07-16

## 2017-07-16 MED ORDER — SODIUM CHLORIDE 0.9 % IV SOLN
10.00 | INTRAVENOUS | Status: DC
Start: ? — End: 2017-07-16

## 2017-07-16 MED ORDER — MORPHINE SULFATE 15 MG PO TABS
15.00 | ORAL_TABLET | ORAL | Status: DC
Start: ? — End: 2017-07-16

## 2017-07-16 MED ORDER — ACETAMINOPHEN 325 MG PO TABS
650.00 | ORAL_TABLET | ORAL | Status: DC
Start: ? — End: 2017-07-16

## 2017-07-16 MED ORDER — INSULIN LISPRO 100 UNIT/ML ~~LOC~~ SOLN
1.00 | SUBCUTANEOUS | Status: DC
Start: 2017-07-14 — End: 2017-07-16

## 2017-07-16 MED ORDER — LIDOCAINE-MENTHOL 3.6-1.25 % EX PTCH
1.00 | MEDICATED_PATCH | CUTANEOUS | Status: DC
Start: 2017-07-15 — End: 2017-07-16

## 2017-07-16 MED ORDER — SERTRALINE HCL 50 MG PO TABS
50.00 | ORAL_TABLET | ORAL | Status: DC
Start: 2017-07-15 — End: 2017-07-16

## 2017-07-16 MED ORDER — DOCUSATE SODIUM 100 MG PO CAPS
100.00 | ORAL_CAPSULE | ORAL | Status: DC
Start: ? — End: 2017-07-16

## 2017-07-16 MED ORDER — ONDANSETRON HCL 4 MG/2ML IJ SOLN
4.00 | INTRAMUSCULAR | Status: DC
Start: ? — End: 2017-07-16

## 2017-07-16 MED ORDER — ATORVASTATIN CALCIUM 80 MG PO TABS
80.00 | ORAL_TABLET | ORAL | Status: DC
Start: 2017-07-14 — End: 2017-07-16

## 2017-07-16 MED ORDER — ACETAMINOPHEN 650 MG RE SUPP
650.00 | RECTAL | Status: DC
Start: ? — End: 2017-07-16

## 2017-07-16 MED ORDER — INSULIN LISPRO 100 UNIT/ML ~~LOC~~ SOLN
1.00 | SUBCUTANEOUS | Status: DC
Start: ? — End: 2017-07-16

## 2017-07-16 MED ORDER — ALBUTEROL SULFATE (2.5 MG/3ML) 0.083% IN NEBU
2.50 | INHALATION_SOLUTION | RESPIRATORY_TRACT | Status: DC
Start: ? — End: 2017-07-16

## 2017-07-16 MED ORDER — NITROGLYCERIN 0.4 MG SL SUBL
0.40 | SUBLINGUAL_TABLET | SUBLINGUAL | Status: DC
Start: ? — End: 2017-07-16

## 2017-07-16 MED ORDER — INSULIN GLARGINE 100 UNIT/ML ~~LOC~~ SOLN
1.00 | SUBCUTANEOUS | Status: DC
Start: 2017-07-14 — End: 2017-07-16

## 2017-07-16 MED ORDER — GUAIFENESIN 100 MG/5ML PO SYRP
200.00 | ORAL_SOLUTION | ORAL | Status: DC
Start: ? — End: 2017-07-16

## 2017-07-16 MED ORDER — INSULIN GLARGINE 100 UNIT/ML ~~LOC~~ SOLN
1.00 | SUBCUTANEOUS | Status: DC
Start: ? — End: 2017-07-16

## 2017-07-16 MED ORDER — ASPIRIN 81 MG PO CHEW
81.00 | CHEWABLE_TABLET | ORAL | Status: DC
Start: 2017-07-15 — End: 2017-07-16

## 2017-07-16 MED ORDER — MICONAZOLE NITRATE 2 % EX POWD
CUTANEOUS | Status: DC
Start: 2017-07-14 — End: 2017-07-16

## 2017-07-16 MED ORDER — MORPHINE SULFATE 15 MG PO TABS
30.00 | ORAL_TABLET | ORAL | Status: DC
Start: 2017-07-14 — End: 2017-07-16

## 2017-07-16 MED ORDER — CARVEDILOL 25 MG PO TABS
25.00 | ORAL_TABLET | ORAL | Status: DC
Start: 2017-07-14 — End: 2017-07-16

## 2017-07-16 MED ORDER — ALUM & MAG HYDROXIDE-SIMETH 200-200-20 MG/5ML PO SUSP
30.00 | ORAL | Status: DC
Start: ? — End: 2017-07-16

## 2017-07-16 MED ORDER — CLOPIDOGREL BISULFATE 75 MG PO TABS
75.00 | ORAL_TABLET | ORAL | Status: DC
Start: 2017-07-15 — End: 2017-07-16

## 2017-07-16 MED ORDER — GUAIFENESIN 400 MG PO TABS
400.00 | ORAL_TABLET | ORAL | Status: DC
Start: ? — End: 2017-07-16

## 2017-07-16 NOTE — Telephone Encounter (Signed)
Copied from [place] [id number]. Topic: Quick Communication - Rx Refill/Question >> Jul 16, 2017 11:15 AM Yvette Rack wrote: Medication: busPIRone (BUSPAR) 15 MG tablet  Has the patient contacted their pharmacy? yes   Preferred Pharmacy (with phone number or street name): [facility] - [place], [address] [phone] (Phone) [phone] (Fax)   Agent: Please be advised that RX refills may take up to 3 business days. We ask that you follow-up with your pharmacy.

## 2017-07-16 NOTE — Telephone Encounter (Signed)
Already filled in another encounter

## 2017-07-19 ENCOUNTER — Ambulatory Visit: Payer: Medicare HMO | Admitting: Family Medicine

## 2017-07-19 MED ORDER — BUSPIRONE HCL 15 MG PO TABS: 15.0000 mg | ORAL_TABLET | Freq: Three times a day (TID) | ORAL | 3 refills | Status: DC

## 2017-07-19 MED ORDER — SERTRALINE HCL 50 MG PO TABS: 50.0000 mg | ORAL_TABLET | Freq: Every day | ORAL | 3 refills | Status: AC

## 2017-07-19 NOTE — Progress Notes (Deleted)
No chief complaint on file.   HPI  4 review of systems  Past Medical History:  Diagnosis Date  . Allergy   . Arthritis   . Chronic kidney disease   . Chronic pain   . Coronary artery disease   . Diabetes mellitus without complication ([place])   . Dyspnea   . GERD (gastroesophageal reflux disease)   . Heart murmur   . Hypertension   . Liver disease   . NASH (nonalcoholic steatohepatitis) 09/16/2016  . Obstructive sleep apnea   . Osteomyelitis ([place])   . Peripheral vascular disease ([place])   . Sleep apnea   . Stroke ([place])   . Ulcers of both great toes ([place]) 11/07/2016   right great toe, started as finger nail size and grew in 6 days     Current Outpatient Medications  Medication Sig Dispense Refill  . atorvastatin (LIPITOR) 80 MG tablet Take 1 tablet (80 mg total) by mouth daily at 6 PM. 90 tablet 3  . BD ULTRA-FINE PEN NEEDLES 29G X 12.7MM MISC 2 packages with 100 in each package 2 each 0  . bisacodyl (DULCOLAX) 10 MG suppository Place 1 suppository (10 mg total) rectally daily as needed for moderate constipation. 12 suppository 0  . busPIRone (BUSPAR) 15 MG tablet TAKE 1 TABLET BY MOUTH THREE TIMES DAILY 90 tablet 0  . carvedilol (COREG) 25 MG tablet TAKE 1 TABLET BY MOUTH TWICE DAILY 180 tablet 0  . clopidogrel (PLAVIX) 75 MG tablet Take 1 tablet (75 mg total) by mouth daily. 90 tablet 3  . diclofenac sodium (VOLTAREN) 1 % GEL Apply 2 g topically as needed (muscle spasms).    . docusate sodium (COLACE) 100 MG capsule Take 1 capsule (100 mg total) by mouth 2 (two) times daily. 10 capsule 0  . exenatide (BYETTA 5 MCG PEN) 5 MCG/0.02ML SOPN injection Inject 0.02 milliliters (12mg total) subcutaneously twice a day with meals 3.6 mL 0  . ezetimibe (ZETIA) 10 MG tablet Take 1 tablet (10 mg total) by mouth daily. 90 tablet 3  . gabapentin (NEURONTIN) 300 MG capsule Take 1 capsule (300 mg total) by mouth 2 (two) times daily.    . insulin aspart (NOVOLOG) 100 UNIT/ML FlexPen Inject 10  Units into the skin 3 (three) times daily before meals. Take only if blood sugar is over 300 15 mL 3  . insulin glargine (LANTUS) 100 UNIT/ML injection Inject 0.25 mLs (25 Units total) into the skin at bedtime. 10 mL 0  . Insulin NPH, Human,, Isophane, (HUMULIN N KWIKPEN) 100 UNIT/ML Kiwkpen Inject 200 units into the skin every morning. And pen needles 4/day.. 25 pen 11  . INSULIN SYRINGE 1CC/29G (B-D INS SYR ULTRAFINE 1CC/29G) 29G X 1/2" 1 ML MISC Used to give insulin injections 4x daily. 200 each 11  . methocarbamol (ROBAXIN) 750 MG tablet Take 1 tablet (750 mg total) by mouth every 6 (six) hours as needed for muscle spasms. 14 tablet 0  . morphine (MS CONTIN) 30 MG 12 hr tablet Take 1 tablet (30 mg total) by mouth every 8 (eight) hours as needed. 8 tablet 0  . morphine (MSIR) 15 MG tablet Take 1 tablet (15 mg total) by mouth every 8 (eight) hours as needed for moderate pain. 10 tablet 0  . polyethylene glycol (MIRALAX / GLYCOLAX) packet Take 17 g by mouth daily as needed for mild constipation. 14 each 0  . ranolazine (RANEXA) 1000 MG SR tablet Take 1 tablet (1,000 mg total) by mouth 2 (  two) times daily. 180 tablet 3  . sertraline (ZOLOFT) 50 MG tablet TAKE 1 TABLET BY MOUTH ONCE DAILY 90 tablet 0   No current facility-administered medications for this visit.     Allergies:  Allergies  Allergen Reactions  . Furosemide Other (See Comments)    Dehydration leading to kidney failure  "my kidneys shutdown"  . Norvasc [Amlodipine Besylate] Swelling    "Severe edema"  . Prednisone Other (See Comments)    Ketoacidosis  . Vancomycin Other (See Comments)    Total organ shut down "It shut my kidneys down"  . Amlodipine Swelling    SWELLING/EDEMA REACTION UNSPECIFIED   . Clindamycin/Lincomycin Hives and Itching  . Doxycycline Hives and Rash  . Propranolol Cough    Bronchospasms  . Ibuprofen Other (See Comments)    WAS TOLD TO NOT TAKE THIS BECAUSE OF HER KIDNEYS AND LIVER  . Lisinopril Other  (See Comments)    WAS TOLD TO NOT TAKE THIS BECAUSE OF HER KIDNEYS AND LIVER  . Lovastatin Other (See Comments)    WAS TOLD TO NOT TAKE THIS BECAUSE OF HER KIDNEYS AND LIVER  . Norvasc [Amlodipine Besylate] Other (See Comments)    Severe edema  . Nsaids Other (See Comments)    WAS TOLD TO NOT TAKE THIS BECAUSE OF HER KIDNEYS AND LIVER  . Sulfa Antibiotics Nausea Only    Past Surgical History:  Procedure Laterality Date  . ABDOMINAL HYSTERECTOMY    . AMPUTATION Bilateral 05/12/2017   Procedure: BILATERAL GREAT TOE AMPUTATION;  Surgeon: Newt Minion, MD;  Location: [facility];  Service: Orthopedics;  Laterality: Bilateral;  . BACK SURGERY    . CESAREAN SECTION    . CHOLECYSTECTOMY    . HERNIA REPAIR    . JOINT REPLACEMENT    . KNEE SURGERY    . SPINE SURGERY    . TOE AMPUTATION    . TOE AMPUTATION Bilateral    3 on right foot 2 left foot   . TONSILLECTOMY    . TUBAL LIGATION      Social History   Socioeconomic History  . Marital status: Divorced    Spouse name: Not on file  . Number of children: Not on file  . Years of education: Not on file  . Highest education level: Not on file  Occupational History  . Not on file  Social Needs  . Financial resource strain: Not on file  . Food insecurity:    Worry: Not on file    Inability: Not on file  . Transportation needs:    Medical: Not on file    Non-medical: Not on file  Tobacco Use  . Smoking status: Never Smoker  . Smokeless tobacco: Never Used  Substance and Sexual Activity  . Alcohol use: No  . Drug use: No  . Sexual activity: Never  Lifestyle  . Physical activity:    Days per week: Not on file    Minutes per session: Not on file  . Stress: Not on file  Relationships  . Social connections:    Talks on phone: Not on file    Gets together: Not on file    Attends religious service: Not on file    Active member of club or organization: Not on file    Attends meetings of clubs or organizations: Not on file     Relationship status: Not on file  Other Topics Concern  . Not on file  Social History Narrative   ** Merged  History Encounter **        Family History  Problem Relation Age of Onset  . Cancer Mother        Thymoma, metastatic  . Heart disease Father   . Stroke Father   . Stroke Maternal Grandfather   . Heart disease Paternal Grandfather   . Multiple sclerosis Sister   . Epilepsy Sister   . Diabetes Neg Hx      ROS Review of Systems See HPI [place]: No fevers or chills No malaise No diaphoresis Skin: No rash or itching Eyes: no blurry vision, no double vision GU: no dysuria or hematuria Neuro: no dizziness or headaches * all others reviewed and negative   Objective: There were no vitals filed for this visit.  Physical Exam  Assessment and Plan There are no diagnoses linked to this encounter.   [place] [facility]

## 2017-07-19 NOTE — Addendum Note (Signed)
Addended by: Delia Chimes A on: 07/19/2017 04:57 PM   Modules accepted: Orders

## 2017-07-19 NOTE — Telephone Encounter (Signed)
zoloft and buspar both have been sent in.  Please notify the patient.

## 2017-07-20 ENCOUNTER — Telehealth: Payer: Self-pay | Admitting: Family Medicine

## 2017-07-20 NOTE — Telephone Encounter (Signed)
Copied from [place] [id number]. Topic: Quick Communication - See Telephone Encounter >> Jul 20, 2017 10:13 AM Robina Ade, Helene Kelp D wrote: CRM for notification. See Telephone encounter for: 07/20/17. April William with [facility] called and is requesting verbal order for pt as follow: PT 2X a week for 3 weeks and working on strength gate training. She can be reached at [phone].

## 2017-07-20 NOTE — Telephone Encounter (Signed)
Verbal given 

## 2017-07-21 ENCOUNTER — Telehealth: Payer: Self-pay | Admitting: Family Medicine

## 2017-07-21 ENCOUNTER — Other Ambulatory Visit: Payer: Self-pay | Admitting: Family Medicine

## 2017-07-21 NOTE — Telephone Encounter (Unsigned)
Copied from [place] [id number]. Topic: Quick Communication - See Telephone Encounter >> Jul 21, 2017  5:04 PM Neva Seat wrote: [place]  - [phone]  OT Verbal Orders:  1 time a week for 1 week 2 times a weeks for  2 weeks 1 time a week for 1 week

## 2017-07-22 ENCOUNTER — Encounter: Payer: Self-pay | Admitting: Family Medicine

## 2017-07-23 NOTE — Telephone Encounter (Signed)
Rosann Auerbach given verbal orders

## 2017-07-26 ENCOUNTER — Ambulatory Visit: Payer: Medicare HMO | Admitting: Family Medicine

## 2017-07-27 ENCOUNTER — Ambulatory Visit (INDEPENDENT_AMBULATORY_CARE_PROVIDER_SITE_OTHER): Payer: Medicare HMO | Admitting: Endocrinology

## 2017-07-27 ENCOUNTER — Encounter: Payer: Self-pay | Admitting: Endocrinology

## 2017-07-27 VITALS — BP 152/72 | HR 77 | Ht 67.0 in | Wt 241.8 lb

## 2017-07-27 DIAGNOSIS — E1165 Type 2 diabetes mellitus with hyperglycemia: Secondary | ICD-10-CM

## 2017-07-27 LAB — POCT GLYCOSYLATED HEMOGLOBIN (HGB A1C): HEMOGLOBIN A1C: 9.2 % — AB (ref 4.0–5.6)

## 2017-07-27 LAB — GLUCOSE, POCT (MANUAL RESULT ENTRY): POC Glucose: 162 mg/dl — AB (ref 70–99)

## 2017-07-27 MED ORDER — INSULIN ISOPHANE HUMAN 100 UNIT/ML KWIKPEN: 220.0000 [IU] | PEN_INJECTOR | Freq: Every day | SUBCUTANEOUS | 11 refills | Status: DC

## 2017-07-27 NOTE — Progress Notes (Signed)
Subjective:    Patient ID: [name], female    DOB: 02-Nov-1955, 62 y.o.   MRN: [id number]  HPI Pt returns for f/u of diabetes mellitus: DM type: Insulin-requiring type 2 Dx'ed: 4034 Complications: polyneuropathy, renal failure, NPDR, CAD, PAD, TIA, and toe amputations Therapy: insulin since 2005, and byetta. GDM: never DKA: never Severe hypoglycemia: never Pancreatitis: never Pancreatic imaging: normal on 2018 CT Other: her hands and vision are not good enough for syringe and vial; she takes qd insulin, due to poor results with multiple daily injections; QD insulin is NPH, due to renal failure.   Interval history: no cbg record, but states cbg's varies from 68-500.   cbg is in general higher as the day goes on.  She takes insulin and Byetta 30-60 mins before breakfast.  In this 30-60 minute time, she says cbg goes from 200-68.   Past Medical History:  Diagnosis Date  . Allergy   . Arthritis   . Chronic kidney disease   . Chronic pain   . Coronary artery disease   . Diabetes mellitus without complication ([place])   . Dyspnea   . GERD (gastroesophageal reflux disease)   . Heart murmur   . Hypertension   . Liver disease   . NASH (nonalcoholic steatohepatitis) 09/16/2016  . Obstructive sleep apnea   . Osteomyelitis ([place])   . Peripheral vascular disease ([place])   . Sleep apnea   . Stroke ([place])   . Ulcers of both great toes ([place]) 11/07/2016   right great toe, started as finger nail size and grew in 6 days     Past Surgical History:  Procedure Laterality Date  . ABDOMINAL HYSTERECTOMY    . AMPUTATION Bilateral 05/12/2017   Procedure: BILATERAL GREAT TOE AMPUTATION;  Surgeon: Newt Minion, MD;  Location: [facility];  Service: Orthopedics;  Laterality: Bilateral;  . BACK SURGERY    . CESAREAN SECTION    . CHOLECYSTECTOMY    . HERNIA REPAIR    . JOINT REPLACEMENT    . KNEE SURGERY    . SPINE SURGERY    . TOE AMPUTATION    . TOE AMPUTATION Bilateral    3 on right foot 2  left foot   . TONSILLECTOMY    . TUBAL LIGATION      Social History   Socioeconomic History  . Marital status: Divorced    Spouse name: Not on file  . Number of children: Not on file  . Years of education: Not on file  . Highest education level: Not on file  Occupational History  . Not on file  Social Needs  . Financial resource strain: Not on file  . Food insecurity:    Worry: Not on file    Inability: Not on file  . Transportation needs:    Medical: Not on file    Non-medical: Not on file  Tobacco Use  . Smoking status: Never Smoker  . Smokeless tobacco: Never Used  Substance and Sexual Activity  . Alcohol use: No  . Drug use: No  . Sexual activity: Never  Lifestyle  . Physical activity:    Days per week: Not on file    Minutes per session: Not on file  . Stress: Not on file  Relationships  . Social connections:    Talks on phone: Not on file    Gets together: Not on file    Attends religious service: Not on file    Active member of club or organization:  Not on file    Attends meetings of clubs or organizations: Not on file    Relationship status: Not on file  . Intimate partner violence:    Fear of current or ex partner: Not on file    Emotionally abused: Not on file    Physically abused: Not on file    Forced sexual activity: Not on file  Other Topics Concern  . Not on file  Social History Narrative   ** Merged History Encounter **        Current Outpatient Medications on File Prior to Visit  Medication Sig Dispense Refill  . atorvastatin (LIPITOR) 80 MG tablet Take 1 tablet (80 mg total) by mouth daily at 6 PM. 90 tablet 3  . bisacodyl (DULCOLAX) 10 MG suppository Place 1 suppository (10 mg total) rectally daily as needed for moderate constipation. 12 suppository 0  . busPIRone (BUSPAR) 15 MG tablet Take 1 tablet (15 mg total) by mouth 3 (three) times daily. 270 tablet 3  . carvedilol (COREG) 25 MG tablet TAKE 1 TABLET BY MOUTH TWICE DAILY 180 tablet 0   . clopidogrel (PLAVIX) 75 MG tablet Take 1 tablet (75 mg total) by mouth daily. 90 tablet 3  . diclofenac sodium (VOLTAREN) 1 % GEL Apply 2 g topically as needed (muscle spasms).    . docusate sodium (COLACE) 100 MG capsule Take 1 capsule (100 mg total) by mouth 2 (two) times daily. 10 capsule 0  . exenatide (BYETTA 5 MCG PEN) 5 MCG/0.02ML SOPN injection Inject 0.02 milliliters (20mg total) subcutaneously twice a day with meals 3.6 mL 0  . ezetimibe (ZETIA) 10 MG tablet Take 1 tablet (10 mg total) by mouth daily. 90 tablet 3  . gabapentin (NEURONTIN) 300 MG capsule Take 1 capsule (300 mg total) by mouth 2 (two) times daily.    . insulin aspart (NOVOLOG) 100 UNIT/ML FlexPen Inject 10 Units into the skin 3 (three) times daily before meals. Take only if blood sugar is over 300 15 mL 3  . Insulin Pen Needle (BD ULTRA-FINE PEN NEEDLES) 29G X 12.7MM MISC USE AS DIRECTED 200 each 0  . INSULIN SYRINGE 1CC/29G (B-D INS SYR ULTRAFINE 1CC/29G) 29G X 1/2" 1 ML MISC Used to give insulin injections 4x daily. 200 each 11  . methocarbamol (ROBAXIN) 750 MG tablet Take 1 tablet (750 mg total) by mouth every 6 (six) hours as needed for muscle spasms. 14 tablet 0  . morphine (MS CONTIN) 30 MG 12 hr tablet Take 1 tablet (30 mg total) by mouth every 8 (eight) hours as needed. 8 tablet 0  . morphine (MSIR) 15 MG tablet Take 1 tablet (15 mg total) by mouth every 8 (eight) hours as needed for moderate pain. 10 tablet 0  . polyethylene glycol (MIRALAX / GLYCOLAX) packet Take 17 g by mouth daily as needed for mild constipation. 14 each 0  . ranolazine (RANEXA) 1000 MG SR tablet Take 1 tablet (1,000 mg total) by mouth 2 (two) times daily. 180 tablet 3  . sertraline (ZOLOFT) 50 MG tablet Take 1 tablet (50 mg total) by mouth daily. 90 tablet 3   No current facility-administered medications on file prior to visit.     Allergies  Allergen Reactions  . Furosemide Other (See Comments)    Dehydration leading to kidney failure    "my kidneys shutdown"  . Norvasc [Amlodipine Besylate] Swelling    "Severe edema"  . Prednisone Other (See Comments)    Ketoacidosis  . Vancomycin Other (  See Comments)    Total organ shut down "It shut my kidneys down"  . Amlodipine Swelling    SWELLING/EDEMA REACTION UNSPECIFIED   . Clindamycin/Lincomycin Hives and Itching  . Doxycycline Hives and Rash  . Propranolol Cough    Bronchospasms  . Ibuprofen Other (See Comments)    WAS TOLD TO NOT TAKE THIS BECAUSE OF HER KIDNEYS AND LIVER  . Lisinopril Other (See Comments)    WAS TOLD TO NOT TAKE THIS BECAUSE OF HER KIDNEYS AND LIVER  . Lovastatin Other (See Comments)    WAS TOLD TO NOT TAKE THIS BECAUSE OF HER KIDNEYS AND LIVER  . Norvasc [Amlodipine Besylate] Other (See Comments)    Severe edema  . Nsaids Other (See Comments)    WAS TOLD TO NOT TAKE THIS BECAUSE OF HER KIDNEYS AND LIVER  . Sulfa Antibiotics Nausea Only    Family History  Problem Relation Age of Onset  . Cancer Mother        Thymoma, metastatic  . Heart disease Father   . Stroke Father   . Stroke Maternal Grandfather   . Heart disease Paternal Grandfather   . Multiple sclerosis Sister   . Epilepsy Sister   . Diabetes Neg Hx     BP (!) 152/72 (BP Location: Right Arm, Patient Position: Sitting, Cuff Size: Normal)   Pulse 77   Ht 5' 7"  (1.702 m)   Wt 241 lb 12.8 oz (109.7 kg)   SpO2 97%   BMI 37.87 kg/m    Review of Systems Denies LOC.      Objective:   Physical Exam VITAL SIGNS:  See vs page GENERAL: no distress Pulses: dorsalis pedis intact bilat.   CV: trace bilat leg edema Skin:  Healed ulcer on the right foot.  normal color and temp on the feet. Neuro: sensation is intact to touch on the feet MSK: absent are the left 2nd and 1-3 right toes.  The left great and 3rd toes are partially amputated.    PULSES: dorsalis pedis intact bilat.   A1c=9.2%    Assessment & Plan:  Insulin-requiring type 2 DM, with PAD: she needs increased  rx Hypoglycemia: she needs to take insulin later than breakfast Renal failure: she needs an intermediate-acting qd insulin  Patient Instructions  Your blood pressure is high today.  Please see your primary care provider soon, to have it rechecked check your blood sugar 3 times a day.  vary the time of day when you check, between before the 3 meals, and at bedtime.  also check if you have symptoms of your blood sugar being too high or too low.  please keep a record of the readings and bring it to your next appointment here (or you can bring the meter itself).  You can write it on any piece of paper.  please call us sooner if your blood sugar goes below 70, or if you have a lot of readings over 200.  Please increase the NPH to 220 units daily (all with lunch), and:  Please continue the same novolog only if the blood sugar is over 300.   Please continue the same Byetta.  Please come back for a follow-up appointment in 2 months.

## 2017-07-27 NOTE — Patient Instructions (Addendum)
Your blood pressure is high today.  Please see your primary care provider soon, to have it rechecked check your blood sugar 3 times a day.  vary the time of day when you check, between before the 3 meals, and at bedtime.  also check if you have symptoms of your blood sugar being too high or too low.  please keep a record of the readings and bring it to your next appointment here (or you can bring the meter itself).  You can write it on any piece of paper.  please call us sooner if your blood sugar goes below 70, or if you have a lot of readings over 200.  Please increase the NPH to 220 units daily (all with lunch), and:  Please continue the same novolog only if the blood sugar is over 300.   Please continue the same [place].  Please come back for a follow-up appointment in 2 months.

## 2017-07-30 ENCOUNTER — Telehealth: Payer: Self-pay | Admitting: Family Medicine

## 2017-07-30 NOTE — Telephone Encounter (Signed)
Verbal given 

## 2017-07-30 NOTE — Telephone Encounter (Signed)
Copied from [facility] [id number]. Topic: Quick Communication - See Telephone Encounter >> Jul 30, 2017  9:55 AM Gardiner Ramus wrote: CRM for notification. See Telephone encounter for: 07/30/17. [facility] from [facility] called for verbal orders for skilled nursing evaluation. UN#[phone]

## 2017-08-02 ENCOUNTER — Ambulatory Visit: Payer: Medicare HMO | Admitting: Family Medicine

## 2017-08-02 NOTE — Progress Notes (Deleted)
No chief complaint on file.   HPI  4 review of systems  Past Medical History:  Diagnosis Date  . Allergy   . Arthritis   . Chronic kidney disease   . Chronic pain   . Coronary artery disease   . Diabetes mellitus without complication ([place])   . Dyspnea   . GERD (gastroesophageal reflux disease)   . Heart murmur   . Hypertension   . Liver disease   . NASH (nonalcoholic steatohepatitis) 09/16/2016  . Obstructive sleep apnea   . Osteomyelitis ([place])   . Peripheral vascular disease ([place])   . Sleep apnea   . Stroke ([place])   . Ulcers of both great toes ([place]) 11/07/2016   right great toe, started as finger nail size and grew in 6 days     Current Outpatient Medications  Medication Sig Dispense Refill  . atorvastatin (LIPITOR) 80 MG tablet Take 1 tablet (80 mg total) by mouth daily at 6 PM. 90 tablet 3  . bisacodyl (DULCOLAX) 10 MG suppository Place 1 suppository (10 mg total) rectally daily as needed for moderate constipation. 12 suppository 0  . busPIRone (BUSPAR) 15 MG tablet Take 1 tablet (15 mg total) by mouth 3 (three) times daily. 270 tablet 3  . carvedilol (COREG) 25 MG tablet TAKE 1 TABLET BY MOUTH TWICE DAILY 180 tablet 0  . clopidogrel (PLAVIX) 75 MG tablet Take 1 tablet (75 mg total) by mouth daily. 90 tablet 3  . diclofenac sodium (VOLTAREN) 1 % GEL Apply 2 g topically as needed (muscle spasms).    . docusate sodium (COLACE) 100 MG capsule Take 1 capsule (100 mg total) by mouth 2 (two) times daily. 10 capsule 0  . exenatide (BYETTA 5 MCG PEN) 5 MCG/0.02ML SOPN injection Inject 0.02 milliliters (35mg total) subcutaneously twice a day with meals 3.6 mL 0  . ezetimibe (ZETIA) 10 MG tablet Take 1 tablet (10 mg total) by mouth daily. 90 tablet 3  . gabapentin (NEURONTIN) 300 MG capsule Take 1 capsule (300 mg total) by mouth 2 (two) times daily.    . insulin aspart (NOVOLOG) 100 UNIT/ML FlexPen Inject 10 Units into the skin 3 (three) times daily before meals. Take only if  blood sugar is over 300 15 mL 3  . Insulin NPH, Human,, Isophane, (HUMULIN N KWIKPEN) 100 UNIT/ML Kiwkpen Inject 220 Units into the skin daily before lunch. And pen needles 4/day.. 30 pen 11  . Insulin Pen Needle (BD ULTRA-FINE PEN NEEDLES) 29G X 12.7MM MISC USE AS DIRECTED 200 each 0  . INSULIN SYRINGE 1CC/29G (B-D INS SYR ULTRAFINE 1CC/29G) 29G X 1/2" 1 ML MISC Used to give insulin injections 4x daily. 200 each 11  . methocarbamol (ROBAXIN) 750 MG tablet Take 1 tablet (750 mg total) by mouth every 6 (six) hours as needed for muscle spasms. 14 tablet 0  . morphine (MS CONTIN) 30 MG 12 hr tablet Take 1 tablet (30 mg total) by mouth every 8 (eight) hours as needed. 8 tablet 0  . morphine (MSIR) 15 MG tablet Take 1 tablet (15 mg total) by mouth every 8 (eight) hours as needed for moderate pain. 10 tablet 0  . polyethylene glycol (MIRALAX / GLYCOLAX) packet Take 17 g by mouth daily as needed for mild constipation. 14 each 0  . ranolazine (RANEXA) 1000 MG SR tablet Take 1 tablet (1,000 mg total) by mouth 2 (two) times daily. 180 tablet 3  . sertraline (ZOLOFT) 50 MG tablet Take 1 tablet (50 mg  total) by mouth daily. 90 tablet 3   No current facility-administered medications for this visit.     Allergies:  Allergies  Allergen Reactions  . Furosemide Other (See Comments)    Dehydration leading to kidney failure  "my kidneys shutdown"  . Norvasc [Amlodipine Besylate] Swelling    "Severe edema"  . Prednisone Other (See Comments)    Ketoacidosis  . Vancomycin Other (See Comments)    Total organ shut down "It shut my kidneys down"  . Amlodipine Swelling    SWELLING/EDEMA REACTION UNSPECIFIED   . Clindamycin/Lincomycin Hives and Itching  . Doxycycline Hives and Rash  . Propranolol Cough    Bronchospasms  . Ibuprofen Other (See Comments)    WAS TOLD TO NOT TAKE THIS BECAUSE OF HER KIDNEYS AND LIVER  . Lisinopril Other (See Comments)    WAS TOLD TO NOT TAKE THIS BECAUSE OF HER KIDNEYS AND  LIVER  . Lovastatin Other (See Comments)    WAS TOLD TO NOT TAKE THIS BECAUSE OF HER KIDNEYS AND LIVER  . Norvasc [Amlodipine Besylate] Other (See Comments)    Severe edema  . Nsaids Other (See Comments)    WAS TOLD TO NOT TAKE THIS BECAUSE OF HER KIDNEYS AND LIVER  . Sulfa Antibiotics Nausea Only    Past Surgical History:  Procedure Laterality Date  . ABDOMINAL HYSTERECTOMY    . AMPUTATION Bilateral 05/12/2017   Procedure: BILATERAL GREAT TOE AMPUTATION;  Surgeon: Newt Minion, MD;  Location: [facility];  Service: Orthopedics;  Laterality: Bilateral;  . BACK SURGERY    . CESAREAN SECTION    . CHOLECYSTECTOMY    . HERNIA REPAIR    . JOINT REPLACEMENT    . KNEE SURGERY    . SPINE SURGERY    . TOE AMPUTATION    . TOE AMPUTATION Bilateral    3 on right foot 2 left foot   . TONSILLECTOMY    . TUBAL LIGATION      Social History   Socioeconomic History  . Marital status: Divorced    Spouse name: Not on file  . Number of children: Not on file  . Years of education: Not on file  . Highest education level: Not on file  Occupational History  . Not on file  Social Needs  . Financial resource strain: Not on file  . Food insecurity:    Worry: Not on file    Inability: Not on file  . Transportation needs:    Medical: Not on file    Non-medical: Not on file  Tobacco Use  . Smoking status: Never Smoker  . Smokeless tobacco: Never Used  Substance and Sexual Activity  . Alcohol use: No  . Drug use: No  . Sexual activity: Never  Lifestyle  . Physical activity:    Days per week: Not on file    Minutes per session: Not on file  . Stress: Not on file  Relationships  . Social connections:    Talks on phone: Not on file    Gets together: Not on file    Attends religious service: Not on file    Active member of club or organization: Not on file    Attends meetings of clubs or organizations: Not on file    Relationship status: Not on file  Other Topics Concern  . Not on file    Social History Narrative   ** Merged History Encounter **        Family History  Problem Relation  Age of Onset  . Cancer Mother        Thymoma, metastatic  . Heart disease Father   . Stroke Father   . Stroke Maternal Grandfather   . Heart disease Paternal Grandfather   . Multiple sclerosis Sister   . Epilepsy Sister   . Diabetes Neg Hx      ROS Review of Systems See HPI Constitution: No fevers or chills No malaise No diaphoresis Skin: No rash or itching Eyes: no blurry vision, no double vision GU: no dysuria or hematuria Neuro: no dizziness or headaches * all others reviewed and negative   Objective: There were no vitals filed for this visit.  Physical Exam  Assessment and Plan There are no diagnoses linked to this encounter.   [name] P [employer]

## 2017-08-03 ENCOUNTER — Telehealth: Payer: Self-pay | Admitting: Family Medicine

## 2017-08-03 NOTE — Telephone Encounter (Unsigned)
Copied from [place] [id number]. Topic: Quick Communication - See Telephone Encounter >> Aug 03, 2017  8:09 AM Mylinda Latina, NT wrote: CRM for notification. See Telephone encounter for: 08/03/17. April ( PT ) calling from [facility] is needing verbal orders for this patient.   Verbal order is 2x a week for 3 weeks  CB# [phone]

## 2017-08-04 NOTE — Telephone Encounter (Signed)
Verbal given 

## 2017-08-06 ENCOUNTER — Telehealth: Payer: Self-pay | Admitting: Family Medicine

## 2017-08-06 NOTE — Telephone Encounter (Unsigned)
Copied from [place] [id number]. Topic: Quick Communication - See Telephone Encounter >> Aug 06, 2017  8:18 AM Mylinda Latina, NT wrote: CRM for notification. See Telephone encounter for: 08/06/17. Rosann Auerbach (OT) calling from [facility] is needing verbal orders for this patient . The orders are to begin Monday 08/09/17 :  2x a week for 2 week, 1x a week for 2 weeks CB# [phone]

## 2017-08-07 NOTE — Telephone Encounter (Signed)
Spoke with Rosann Auerbach OT.  Gave verbal orders.

## 2017-08-10 ENCOUNTER — Ambulatory Visit: Payer: 59 | Admitting: Family Medicine

## 2017-08-10 ENCOUNTER — Telehealth: Payer: Self-pay | Admitting: Family Medicine

## 2017-08-10 NOTE — Telephone Encounter (Signed)
Copied from [place] [id number]. Topic: General - Other >> Aug 10, 2017  9:00 AM Cecelia Byars, NT wrote: Reason for CRM: Tomi Bamberger from [facility] called and would like an order for a social worker to come see the patient ,she says the patient hast o have options for how to spend her day,  transportation will not go that far , please call [phone]

## 2017-08-11 NOTE — Telephone Encounter (Signed)
Spoke to [place] - order given for SW eval. Orders given for OT 1 xwk x 2wk

## 2017-08-16 ENCOUNTER — Ambulatory Visit: Payer: 59 | Admitting: Family Medicine

## 2017-08-16 ENCOUNTER — Encounter

## 2017-08-16 NOTE — Progress Notes (Deleted)
No chief complaint on file.   HPI  4 review of systems  Past Medical History:  Diagnosis Date  . Allergy   . Arthritis   . Chronic kidney disease   . Chronic pain   . Coronary artery disease   . Diabetes mellitus without complication ([place])   . Dyspnea   . GERD (gastroesophageal reflux disease)   . Heart murmur   . Hypertension   . Liver disease   . NASH (nonalcoholic steatohepatitis) 09/16/2016  . Obstructive sleep apnea   . Osteomyelitis ([place])   . Peripheral vascular disease ([place])   . Sleep apnea   . Stroke ([place])   . Ulcers of both great toes ([place]) 11/07/2016   right great toe, started as finger nail size and grew in 6 days     Current Outpatient Medications  Medication Sig Dispense Refill  . atorvastatin (LIPITOR) 80 MG tablet Take 1 tablet (80 mg total) by mouth daily at 6 PM. 90 tablet 3  . bisacodyl (DULCOLAX) 10 MG suppository Place 1 suppository (10 mg total) rectally daily as needed for moderate constipation. 12 suppository 0  . busPIRone (BUSPAR) 15 MG tablet Take 1 tablet (15 mg total) by mouth 3 (three) times daily. 270 tablet 3  . carvedilol (COREG) 25 MG tablet TAKE 1 TABLET BY MOUTH TWICE DAILY 180 tablet 0  . clopidogrel (PLAVIX) 75 MG tablet Take 1 tablet (75 mg total) by mouth daily. 90 tablet 3  . diclofenac sodium (VOLTAREN) 1 % GEL Apply 2 g topically as needed (muscle spasms).    . docusate sodium (COLACE) 100 MG capsule Take 1 capsule (100 mg total) by mouth 2 (two) times daily. 10 capsule 0  . exenatide (BYETTA 5 MCG PEN) 5 MCG/0.02ML SOPN injection Inject 0.02 milliliters (57mg total) subcutaneously twice a day with meals 3.6 mL 0  . ezetimibe (ZETIA) 10 MG tablet Take 1 tablet (10 mg total) by mouth daily. 90 tablet 3  . gabapentin (NEURONTIN) 300 MG capsule Take 1 capsule (300 mg total) by mouth 2 (two) times daily.    . insulin aspart (NOVOLOG) 100 UNIT/ML FlexPen Inject 10 Units into the skin 3 (three) times daily before meals. Take only if  blood sugar is over 300 15 mL 3  . Insulin NPH, Human,, Isophane, (HUMULIN N KWIKPEN) 100 UNIT/ML Kiwkpen Inject 220 Units into the skin daily before lunch. And pen needles 4/day.. 30 pen 11  . Insulin Pen Needle (BD ULTRA-FINE PEN NEEDLES) 29G X 12.7MM MISC USE AS DIRECTED 200 each 0  . INSULIN SYRINGE 1CC/29G (B-D INS SYR ULTRAFINE 1CC/29G) 29G X 1/2" 1 ML MISC Used to give insulin injections 4x daily. 200 each 11  . methocarbamol (ROBAXIN) 750 MG tablet Take 1 tablet (750 mg total) by mouth every 6 (six) hours as needed for muscle spasms. 14 tablet 0  . morphine (MS CONTIN) 30 MG 12 hr tablet Take 1 tablet (30 mg total) by mouth every 8 (eight) hours as needed. 8 tablet 0  . morphine (MSIR) 15 MG tablet Take 1 tablet (15 mg total) by mouth every 8 (eight) hours as needed for moderate pain. 10 tablet 0  . polyethylene glycol (MIRALAX / GLYCOLAX) packet Take 17 g by mouth daily as needed for mild constipation. 14 each 0  . ranolazine (RANEXA) 1000 MG SR tablet Take 1 tablet (1,000 mg total) by mouth 2 (two) times daily. 180 tablet 3  . sertraline (ZOLOFT) 50 MG tablet Take 1 tablet (50 mg  total) by mouth daily. 90 tablet 3   No current facility-administered medications for this visit.     Allergies:  Allergies  Allergen Reactions  . Furosemide Other (See Comments)    Dehydration leading to kidney failure  "my kidneys shutdown"  . Norvasc [Amlodipine Besylate] Swelling    "Severe edema"  . Prednisone Other (See Comments)    Ketoacidosis  . Vancomycin Other (See Comments)    Total organ shut down "It shut my kidneys down"  . Amlodipine Swelling    SWELLING/EDEMA REACTION UNSPECIFIED   . Clindamycin/Lincomycin Hives and Itching  . Doxycycline Hives and Rash  . Propranolol Cough    Bronchospasms  . Ibuprofen Other (See Comments)    WAS TOLD TO NOT TAKE THIS BECAUSE OF HER KIDNEYS AND LIVER  . Lisinopril Other (See Comments)    WAS TOLD TO NOT TAKE THIS BECAUSE OF HER KIDNEYS AND  LIVER  . Lovastatin Other (See Comments)    WAS TOLD TO NOT TAKE THIS BECAUSE OF HER KIDNEYS AND LIVER  . Norvasc [Amlodipine Besylate] Other (See Comments)    Severe edema  . Nsaids Other (See Comments)    WAS TOLD TO NOT TAKE THIS BECAUSE OF HER KIDNEYS AND LIVER  . Sulfa Antibiotics Nausea Only    Past Surgical History:  Procedure Laterality Date  . ABDOMINAL HYSTERECTOMY    . AMPUTATION Bilateral 05/12/2017   Procedure: BILATERAL GREAT TOE AMPUTATION;  Surgeon: Newt Minion, MD;  Location: [facility];  Service: Orthopedics;  Laterality: Bilateral;  . BACK SURGERY    . CESAREAN SECTION    . CHOLECYSTECTOMY    . HERNIA REPAIR    . JOINT REPLACEMENT    . KNEE SURGERY    . SPINE SURGERY    . TOE AMPUTATION    . TOE AMPUTATION Bilateral    3 on right foot 2 left foot   . TONSILLECTOMY    . TUBAL LIGATION      Social History   Socioeconomic History  . Marital status: Divorced    Spouse name: Not on file  . Number of children: Not on file  . Years of education: Not on file  . Highest education level: Not on file  Occupational History  . Not on file  Social Needs  . Financial resource strain: Not on file  . Food insecurity:    Worry: Not on file    Inability: Not on file  . Transportation needs:    Medical: Not on file    Non-medical: Not on file  Tobacco Use  . Smoking status: Never Smoker  . Smokeless tobacco: Never Used  Substance and Sexual Activity  . Alcohol use: No  . Drug use: No  . Sexual activity: Never  Lifestyle  . Physical activity:    Days per week: Not on file    Minutes per session: Not on file  . Stress: Not on file  Relationships  . Social connections:    Talks on phone: Not on file    Gets together: Not on file    Attends religious service: Not on file    Active member of club or organization: Not on file    Attends meetings of clubs or organizations: Not on file    Relationship status: Not on file  Other Topics Concern  . Not on file    Social History Narrative   ** Merged History Encounter **        Family History  Problem Relation  Age of Onset  . Cancer Mother        Thymoma, metastatic  . Heart disease Father   . Stroke Father   . Stroke Maternal Grandfather   . Heart disease Paternal Grandfather   . Multiple sclerosis Sister   . Epilepsy Sister   . Diabetes Neg Hx      ROS Review of Systems See HPI Constitution: No fevers or chills No malaise No diaphoresis Skin: No rash or itching Eyes: no blurry vision, no double vision GU: no dysuria or hematuria Neuro: no dizziness or headaches * all others reviewed and negative   Objective: There were no vitals filed for this visit.  Physical Exam  Assessment and Plan There are no diagnoses linked to this encounter.   [name]

## 2017-08-23 ENCOUNTER — Telehealth: Payer: Self-pay | Admitting: Family Medicine

## 2017-08-23 ENCOUNTER — Ambulatory Visit: Payer: Medicare HMO | Admitting: Adult Health

## 2017-08-23 ENCOUNTER — Telehealth: Payer: Self-pay

## 2017-08-23 NOTE — Telephone Encounter (Signed)
Copied from [place] [id number]. Topic: General - Other >> Aug 23, 2017  9:21 AM Keene Breath wrote: Reason for CRM: April with [facility] called to get orders for patient - PT 2 x wk for 4 wks.  CB# [phone].

## 2017-08-23 NOTE — Progress Notes (Deleted)
[facility] [address] [place]. [place] [address] [phone]       OFFICE FOLLOW UP NOTE  Ms. [name] Date of Birth:  1955/06/11 Medical Record Number:  [id number]   Reason for Referral:  Hospital TIA vs migraine follow up  CHIEF COMPLAINT:  No chief complaint on file.   HPI: [name] is being seen today for initial visit in the office for TIA vs migraine on 01/01/17. History obtained from patient and chart review. Reviewed all radiology images and labs personally.  Jamie Personsis a 62 y.o.femalewith a history of stroke, OSA, liver disease, HTN, hx of migraine HA, DM, CAD and CKD who presented with left-sided weakness on 01/01/17. Some residual deficits of previous stroke present but c/o worsening of her deficits as well as migraine headache. She stateed that she used to get migraines fairly frequently, but does not have them very often anymore. She has had maybe one since her last stroke and did not have worsening of her deficits with it. Her previous headaches were not as bad as her current one. CT head reviewed and was negative for acute intracranial abnormality. CTA head and neck performed which was unremarkable. 2D echo showed an EF of 60-65%. Unable to have MRI performed as patient has spinal stimulator. LDL was unable to be calculated due to TG 603. A1c 12.4. No antithrombotic was taken prior to admission, recommended patient start clopidogrel 45m daily. No lipid lowering medications were taken prior to admission, recommended patient start lipitor and zetia. Recommended close PCP follow up and DM education for elevated A1c level. Patient was discharged home in stable condition.   Patient comes in today for initial follow-up after hospital admission accompanied by her daughter.  She has had 3 hospital admissions since discharge for infections and low blood pressure.  Continues physical therapy for left-sided weakness.  States she still has  increased left-sided weakness but has been improving some.  Complains of headache 2-3 times per week that is accompanied by photophobia and nausea but denies phonophobia and vomiting.  She has had 7-8 hypoglycemia episodes but will be going to see the endocrinologist on 04/29/2017.  She continues to eat high carb items such as pastas, potatoes, large amount of fruit and sugary drinks. At today's visit, both her and her daughter brought in slushys from a gas station.  She states when she had a hypoglycemic episode she was in the car and [facility] then had an issue with her blood sugar being too high.  She is compliant with her diabetic medications.  Blood pressure at today's visit 104/64 and states her blood pressure typically runs SBP 110 - 120 at home.  Continues to take Lipitor and Zetia for cholesterol with mild muscle pain.  Continues to take Plavix with mild bruising but no bleeding.  Patient reports that she had a fall this morning while trying to clean up something on the floor.  She fell directly onto her left knee that left her with adequate amount of swelling and pain over her left knee.  She denies hitting her head or pain in any other location.  Currently is painful for patient to straighten leg completely or put any pressure on the area.  Patient has had sleep apnea study in the past and was told she has OSA but has not worn her CPAP.  Primary care put in a referral for sleep study to be done. Denies new or worsening stroke/TIA symptoms.  Was about sleep exam room, she started stating that she is feeling nauseous and dizzy which she normally feels with a hypoglycemic episode.  She states she feels like this frequently and was drinking on her slushy to help bring her blood sugar back up.  Random glucose level obtained to see if in fact the symptoms relate to hypoglycemia.  Patient denied staying in the office until she felt better better as she wanted to leave to get food  that would help increase her sugar level.  Her daughter was with her during this episode who felt comfortable bringing her home.   ROS:   14 system review of systems performed and negative with exception of weight loss, swelling in her legs, ringing in her ears, cough, snoring, easy bruising, increased thirst, joint pain, cramps, aching muscles, allergies, headache, numbness, weakness, difficulty swallowing, and snoring  PMH:  Past Medical History:  Diagnosis Date  . Allergy   . Arthritis   . Chronic kidney disease   . Chronic pain   . Coronary artery disease   . Diabetes mellitus without complication ([place])   . Dyspnea   . GERD (gastroesophageal reflux disease)   . Heart murmur   . Hypertension   . Liver disease   . NASH (nonalcoholic steatohepatitis) 09/16/2016  . Obstructive sleep apnea   . Osteomyelitis ([place])   . Peripheral vascular disease ([place])   . Sleep apnea   . Stroke ([place])   . Ulcers of both great toes ([place]) 11/07/2016   right great toe, started as finger nail size and grew in 6 days     PSH:  Past Surgical History:  Procedure Laterality Date  . ABDOMINAL HYSTERECTOMY    . AMPUTATION Bilateral 05/12/2017   Procedure: BILATERAL GREAT TOE AMPUTATION;  Surgeon: Newt Minion, MD;  Location: [facility];  Service: Orthopedics;  Laterality: Bilateral;  . BACK SURGERY    . CESAREAN SECTION    . CHOLECYSTECTOMY    . HERNIA REPAIR    . JOINT REPLACEMENT    . KNEE SURGERY    . SPINE SURGERY    . TOE AMPUTATION    . TOE AMPUTATION Bilateral    3 on right foot 2 left foot   . TONSILLECTOMY    . TUBAL LIGATION      Social History:  Social History   Socioeconomic History  . Marital status: Divorced    Spouse name: Not on file  . Number of children: Not on file  . Years of education: Not on file  . Highest education level: Not on file  Occupational History  . Not on file  Social Needs  . Financial resource strain: Not on file  . Food insecurity:    Worry: Not on  file    Inability: Not on file  . Transportation needs:    Medical: Not on file    Non-medical: Not on file  Tobacco Use  . Smoking status: Never Smoker  . Smokeless tobacco: Never Used  Substance and Sexual Activity  . Alcohol use: No  . Drug use: No  . Sexual activity: Never  Lifestyle  . Physical activity:    Days per week: Not on file    Minutes per session: Not on file  . Stress: Not on file  Relationships  . Social connections:    Talks on phone: Not on file    Gets together: Not on file    Attends religious service: Not on file    Active  member of club or organization: Not on file    Attends meetings of clubs or organizations: Not on file    Relationship status: Not on file  . Intimate partner violence:    Fear of current or ex partner: Not on file    Emotionally abused: Not on file    Physically abused: Not on file    Forced sexual activity: Not on file  Other Topics Concern  . Not on file  Social History Narrative   ** Merged History Encounter **        Family History:  Family History  Problem Relation Age of Onset  . Cancer Mother        Thymoma, metastatic  . Heart disease Father   . Stroke Father   . Stroke Maternal Grandfather   . Heart disease Paternal Grandfather   . Multiple sclerosis Sister   . Epilepsy Sister   . Diabetes Neg Hx     Medications:   Current Outpatient Medications on File Prior to Visit  Medication Sig Dispense Refill  . atorvastatin (LIPITOR) 80 MG tablet Take 1 tablet (80 mg total) by mouth daily at 6 PM. 90 tablet 3  . bisacodyl (DULCOLAX) 10 MG suppository Place 1 suppository (10 mg total) rectally daily as needed for moderate constipation. 12 suppository 0  . busPIRone (BUSPAR) 15 MG tablet Take 1 tablet (15 mg total) by mouth 3 (three) times daily. 270 tablet 3  . carvedilol (COREG) 25 MG tablet TAKE 1 TABLET BY MOUTH TWICE DAILY 180 tablet 0  . clopidogrel (PLAVIX) 75 MG tablet Take 1 tablet (75 mg total) by mouth daily.  90 tablet 3  . diclofenac sodium (VOLTAREN) 1 % GEL Apply 2 g topically as needed (muscle spasms).    . docusate sodium (COLACE) 100 MG capsule Take 1 capsule (100 mg total) by mouth 2 (two) times daily. 10 capsule 0  . exenatide (BYETTA 5 MCG PEN) 5 MCG/0.02ML SOPN injection Inject 0.02 milliliters (72mg total) subcutaneously twice a day with meals 3.6 mL 0  . ezetimibe (ZETIA) 10 MG tablet Take 1 tablet (10 mg total) by mouth daily. 90 tablet 3  . gabapentin (NEURONTIN) 300 MG capsule Take 1 capsule (300 mg total) by mouth 2 (two) times daily.    . insulin aspart (NOVOLOG) 100 UNIT/ML FlexPen Inject 10 Units into the skin 3 (three) times daily before meals. Take only if blood sugar is over 300 15 mL 3  . Insulin NPH, Human,, Isophane, (HUMULIN N KWIKPEN) 100 UNIT/ML Kiwkpen Inject 220 Units into the skin daily before lunch. And pen needles 4/day.. 30 pen 11  . Insulin Pen Needle (BD ULTRA-FINE PEN NEEDLES) 29G X 12.7MM MISC USE AS DIRECTED 200 each 0  . INSULIN SYRINGE 1CC/29G (B-D INS SYR ULTRAFINE 1CC/29G) 29G X 1/2" 1 ML MISC Used to give insulin injections 4x daily. 200 each 11  . methocarbamol (ROBAXIN) 750 MG tablet Take 1 tablet (750 mg total) by mouth every 6 (six) hours as needed for muscle spasms. 14 tablet 0  . morphine (MS CONTIN) 30 MG 12 hr tablet Take 1 tablet (30 mg total) by mouth every 8 (eight) hours as needed. 8 tablet 0  . morphine (MSIR) 15 MG tablet Take 1 tablet (15 mg total) by mouth every 8 (eight) hours as needed for moderate pain. 10 tablet 0  . polyethylene glycol (MIRALAX / GLYCOLAX) packet Take 17 g by mouth daily as needed for mild constipation. 14 each 0  .  ranolazine (RANEXA) 1000 MG SR tablet Take 1 tablet (1,000 mg total) by mouth 2 (two) times daily. 180 tablet 3  . sertraline (ZOLOFT) 50 MG tablet Take 1 tablet (50 mg total) by mouth daily. 90 tablet 3   No current facility-administered medications on file prior to visit.     Allergies:   Allergies    Allergen Reactions  . Furosemide Other (See Comments)    Dehydration leading to kidney failure  "my kidneys shutdown"  . Norvasc [Amlodipine Besylate] Swelling    "Severe edema"  . Prednisone Other (See Comments)    Ketoacidosis  . Vancomycin Other (See Comments)    Total organ shut down "It shut my kidneys down"  . Amlodipine Swelling    SWELLING/EDEMA REACTION UNSPECIFIED   . Clindamycin/Lincomycin Hives and Itching  . Doxycycline Hives and Rash  . Propranolol Cough    Bronchospasms  . Ibuprofen Other (See Comments)    WAS TOLD TO NOT TAKE THIS BECAUSE OF HER KIDNEYS AND LIVER  . Lisinopril Other (See Comments)    WAS TOLD TO NOT TAKE THIS BECAUSE OF HER KIDNEYS AND LIVER  . Lovastatin Other (See Comments)    WAS TOLD TO NOT TAKE THIS BECAUSE OF HER KIDNEYS AND LIVER  . Norvasc [Amlodipine Besylate] Other (See Comments)    Severe edema  . Nsaids Other (See Comments)    WAS TOLD TO NOT TAKE THIS BECAUSE OF HER KIDNEYS AND LIVER  . Sulfa Antibiotics Nausea Only     Physical Exam  There were no vitals filed for this visit. There is no height or weight on file to calculate BMI. No exam data present  General: Obese middle-aged Caucasian female, seated, in no evident distress Head: head normocephalic and atraumatic.   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 Oriented to place and time. Recent and remote memory intact. Attention span, concentration and fund of knowledge appropriate. Mood and affect appropriate.  Cranial Nerves: Fundoscopic exam reveals sharp disc margins. Pupils equal, briskly reactive to  light. Extraocular movements full without nystagmus. Visual fields full to confrontation. Hearing intact. Facial sensation intact. Face, tongue, palate moves normally and symmetrically.  Motor: 5/5 right upper extremity; 4/5 bilateral lower extremities; weak dorsiflexion in left foot; 3/5 left upper extremity Sensory.:  No sensation bilateral lower extremity ankle down.  Sensation increases in bilateral lower extremity above-knee.  Equal sensation noted in bilateral upper extremities Coordination: Rapid alternating movements normal in all extremities. Finger-to-nose and heel-to-shin performed accurately bilaterally.  Orbiting right hand over left. Gait and Station: Arises from chair with mild difficulty. Stance is hunched.  Gait demonstrates waddling gait with assistance from cane.  Tandem gait not performed. Reflexes: 1+ and symmetric. Toes downgoing.    NIHSS 1 (partial sensory loss)  Modified Rankin  4    Diagnostic Data (Labs, Imaging, Testing)  Ct Head Wo Contrast 01/01/2017 IMPRESSION:  No CT evidence for acute intracranial abnormality.   CTA Head & Neck - 01/03/2017 IMPRESSION: CT HEAD: 1. No acute intracranial process. 2. Mild to moderate atherosclerosis, otherwise negative CT HEAD with and without contrast for age. CTA NECK: 1. No hemodynamically significant stenosis or  acute vascular process. 2. **An incidental finding of potential clinical significance has been found. 3.5 cm ascending aorta. Recommend annual imaging followup by CTA or MRA. This recommendation follows 2010 ACCF/AHA/AATS/ACR/ASA/SCA/SCAI/SIR/STS/SVM Guidelines for the Diagnosis and Management of Patients with Thoracic Aortic Disease. Circulation.2010; 121: N539-Y728** CTA HEAD: 1. No emergent large vessel occlusion. 2. 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 3. Mild atherosclerosis cerebral artery's.  Transthoracic Echocardiogram 09/21/2016 Impressions: - Normal LV systolic  function; mild diastolic dysfunction; sclerotic aortic valve; mildly dilated ascending aorta.     ASSESSMENT: [name] is a 62 y.o. year old female here with TIA versus migraine on 01/01/2017.  Uncontrolled diabetes and history of OSA, HTN, and HLD.  Denies new or worsening stroke/TIA symptoms.    PLAN: -Continue clopidogrel 75 mg daily  and Lipitor and Zetia for secondary stroke prevention -F/u with PCP regarding your HLD and HTN management -F/U with endocrinologist regarding diabetes management -Check A1c, random glucose and lipid levels during this appointment -Order for knee x-ray [place] imaging placed -Recommend patient see nutritionalist for education regarding diabetes management.  Provided patient with education regarding diabetic nutrition. -Maintain strict control of hypertension with blood pressure goal below 130/90, diabetes with hemoglobin A1c goal below 6.5% and cholesterol with LDL cholesterol (bad cholesterol) goal below 70 mg/dL. I also advised the patient to eat a healthy diet with plenty of whole grains, cereals, fruits and vegetables, exercise regularly and maintain ideal body weight.  Greater than 50% time during this 25 minute consultation visit was spent on counseling and coordination of care about HLD, HTN and DM (risk factors), discussion about risk benefit of anticoagulation and answering questions.   Follow up in 4 months or call earlier if needed   No orders of the defined types were placed in this encounter.    Venancio Poisson, AGNP-BC  [facility] [address] [place], [place] [address]  Phone [phone] Fax [phone]

## 2017-08-23 NOTE — Telephone Encounter (Signed)
Verbal given 

## 2017-08-23 NOTE — Telephone Encounter (Signed)
Patient no show for appt today.

## 2017-08-25 ENCOUNTER — Encounter: Payer: Self-pay | Admitting: Adult Health

## 2017-09-02 ENCOUNTER — Ambulatory Visit (INDEPENDENT_AMBULATORY_CARE_PROVIDER_SITE_OTHER): Payer: Medicare HMO | Admitting: Orthopedic Surgery

## 2017-09-08 ENCOUNTER — Telehealth: Payer: Self-pay

## 2017-09-08 NOTE — Telephone Encounter (Signed)
Spoke with daughter [name] as pt was unavailable to speak on the phone.  Calling to inquire about pt as she "no showed " 2 appointments and dr Nolon Rod concerned about pt and her DM management.  Asked if pt had been seeing her endocrinologist since she had not been to office in quite awhile, daughter advises pt last saw endocrinologist sometime in June. Also pt in the hospital 2 weeks ago for kidney failure and medication toxicity.  Daughter is concerned about pt's cognitive issues and no compliance and her behavior. Daughter started to talk about her pregnancy and their move to [place] because it didn't work out in [place].  I interrupted daughter an I advised daughter to hold on so I can transfer her to scheduling so we can get Ms. Person's in for f/u.  Call transferred to [facility].  [place], CMA

## 2017-09-09 ENCOUNTER — Ambulatory Visit (INDEPENDENT_AMBULATORY_CARE_PROVIDER_SITE_OTHER): Payer: Medicare HMO | Admitting: Family Medicine

## 2017-09-09 ENCOUNTER — Other Ambulatory Visit: Payer: Self-pay

## 2017-09-09 ENCOUNTER — Encounter: Payer: Self-pay | Admitting: Family Medicine

## 2017-09-09 VITALS — BP 117/73 | HR 95 | Temp 98.6°F | Resp 20 | Ht 65.47 in | Wt 230.8 lb

## 2017-09-09 DIAGNOSIS — N183 Chronic kidney disease, stage 3 unspecified: Secondary | ICD-10-CM

## 2017-09-09 DIAGNOSIS — I10 Essential (primary) hypertension: Secondary | ICD-10-CM | POA: Diagnosis not present

## 2017-09-09 DIAGNOSIS — E1165 Type 2 diabetes mellitus with hyperglycemia: Secondary | ICD-10-CM

## 2017-09-09 DIAGNOSIS — E1142 Type 2 diabetes mellitus with diabetic polyneuropathy: Secondary | ICD-10-CM

## 2017-09-09 DIAGNOSIS — G546 Phantom limb syndrome with pain: Secondary | ICD-10-CM | POA: Diagnosis not present

## 2017-09-09 NOTE — Patient Instructions (Addendum)
IF you received an x-ray today, you will receive an invoice from [facility]. Please contact [facility] at [phone] with questions or concerns regarding your invoice.   IF you received labwork today, you will receive an invoice from [facility]. Please contact [facility] at [phone] with questions or concerns regarding your invoice.   Our billing staff will not be able to assist you with questions regarding bills from these companies.  You will be contacted with the lab results as soon as they are available. The fastest way to get your results is to activate your My Chart account. Instructions are located on the last page of this paperwork. If you have not heard from [place] regarding the results in 2 weeks, please contact this office.     Coping With Diabetes Diabetes (type 1 diabetes mellitus or type 2 diabetes mellitus) is a condition in which the body does not have enough of a hormone called insulin, or the body does not respond properly to insulin. Normally, insulin allows sugars (glucose) to enter cells in the body. The cells use glucose for energy. With diabetes, extra glucose builds up in the blood instead of going into cells, which results in high blood glucose (hyperglycemia). How to manage lifestyle changes Managing diabetes includes medical treatments as well as lifestyle changes. If diabetes is not managed well, serious physical and emotional complications can occur. Taking good care of yourself means that you are responsible for:  Monitoring glucose regularly.  Eating a healthy diet.  Exercising regularly.  Meeting with health care providers.  Taking medicines as directed.  Some people may feel a lot of stress about managing their diabetes. This is known as emotional distress, and it is very common. Living with diabetes can place you at risk for emotional distress, depression, or anxiety. These disorders can be confusing and can make diabetes management  more difficult. How to recognize stress Emotional distress Symptoms of emotional distress include:  Anger about having a diagnosis of diabetes.  Fear or frustration about your diagnosis and the changes you need to make to manage the condition.  Being overly worried about the care that you need or the cost of the care you need.  Feeling like you caused your condition by doing something wrong.  Fear of unpredictable situations, like low or high blood glucose.  Feeling judged by your health care providers.  Feeling very alone with the disease.  Getting too tired or "burned out" with the demands of daily care.  Depression Having diabetes means that you are at a higher risk for depression. Having depression also means that you are at a higher risk for diabetes. Your health care provider may test (screen) you for symptoms of depression. It is important to recognize depression symptoms and to start treatment for it soon after it is diagnosed. The following are some symptoms of depression:  Loss of interest in things that you used to enjoy.  Trouble sleeping, or often waking up early and not being able to get back to sleep.  A change in appetite.  Feeling tired most of the day.  Feeling nervous and anxious.  Feeling guilty and worrying that you are a burden to others.  Feeling depressed more often than you do not feel that way.  Thoughts of hurting yourself or feeling that you want to die.  If you have any of these symptoms for 2 weeks or longer, reach out to a health care provider. Where to find support  Ask  your health care provider to recommend a therapist who understands both depression and diabetes.  Search for information and support from the American Diabetes Association: www.diabetes.org  Find a certified diabetes educator and make an appointment through [place] of Diabetes Educators: www.diabeteseducator.org Follow these instructions at home: Managing  emotional distress The following are some ways to manage emotional distress:  Talk with your health care provider or certified diabetes educator. Consider working with a counselor or therapist.  Learn as much as you can about diabetes and its treatment. Meet with a certified diabetes educator or take a class to learn how to manage your condition.  Keep a journal of your thoughts and concerns.  Accept that some things are out of your control.  Talk with other people who have diabetes. It can help to talk with others about the emotional distress that you feel.  Find ways to manage stress that work for you. These may include art or music therapy, exercise, meditation, and hobbies.  Seek support from spiritual leaders, family, and friends.  General instructions  Follow your diabetes management plan.  Keep all follow-up visits as told by your health care provider. This is important. Get help right away if:  You have thoughts about hurting yourself or others. If you ever feel like you may hurt yourself or others, or have thoughts about taking your own life, get help right away. You can go to your nearest emergency department or call:  Your local emergency services (911 in the [place]  A suicide crisis helpline, such as the [place] at (580)258-8135. This is open 24 hours a day.  Summary  Diabetes (type 1 diabetes mellitus or type 2 diabetes mellitus) is a condition in which the body does not have enough of a hormone called insulin, or the body does not respond properly to insulin.  Living with diabetes puts you at risk for medical issues, and it also puts you at risk for emotional issues such as emotional distress, depression, and anxiety.  Recognizing the symptoms of emotional distress and depression may help you avoid problems with your diabetes control. It is important to start treatment for emotional distress and depression soon after they are  diagnosed.  Having diabetes means that you are at a higher risk for depression. Ask your health care provider to recommend a therapist who understands both depression and diabetes.  If you experience symptoms of emotional distress or depression, it is important to discuss this with your health care provider, certified diabetes educator, or therapist. This information is not intended to replace advice given to you by your health care provider. Make sure you discuss any questions you have with your health care provider. Document Released: 06/04/2016 Document Revised: 06/04/2016 Document Reviewed: 06/04/2016 Elsevier Interactive Patient Education  2018 [employer].

## 2017-09-09 NOTE — Progress Notes (Signed)
Chief Complaint  Patient presents with  . Diabetes    f/u  . Memory Issues    X 1 1/2 mth    HPI  Pt has been to the ER 08/18/17 due to decreased LOC and she was given narcan because she was having renal failure and her MS contin was causing overdose.  She is now only on morphine sulfate immediate release 41m every 8 hours She reports that she had a very flat affect and states that she would like to also g She still PT/OT and home health RN is still coming out to the house.    She saw Dr. ELoanne Drillingfor diabetes Insulin regimen aspart 10 units tid for sugars 300 and above [place] 540m bid humalin 220 before lunch  Sugars - fasting 200-400 Her last a1c was improved at 9.2  Lab Results  Component Value Date   HGBA1C 9.2 (A) 07/27/2017   Component     Latest Ref Rng & Units 04/23/2017  Hemoglobin A1C     4.8 - 5.6 % 11.3 (H)  Est. average glucose Bld gHb Est-mCnc     mg/dL 278   Lab Results  Component Value Date   CREATININE 1.62 (H) 06/14/2017      Past Medical History:  Diagnosis Date  . Allergy   . Arthritis   . Chronic kidney disease   . Chronic pain   . Coronary artery disease   . Diabetes mellitus without complication ([facility]  . Dyspnea   . GERD (gastroesophageal reflux disease)   . Heart murmur   . Hypertension   . Liver disease   . NASH (nonalcoholic steatohepatitis) 09/16/2016  . Obstructive sleep apnea   . Osteomyelitis ([facility]  . Peripheral vascular disease ([facility]  . Sleep apnea   . Stroke (HCO[facility]  . Ulcers of both great toes ([facility]CM[facility] [place]10/07/2016   right great toe, started as finger nail size and grew in 6 days     Current Outpatient Medications  Medication Sig Dispense Refill  . atorvastatin (LIPITOR) 80 MG tablet Take 1 tablet (80 mg total) by mouth daily at 6 PM. 90 tablet 3  . bisacodyl (DULCOLAX) 10 MG suppository Place 1 suppository (10 mg total) rectally daily as needed for moderate constipation. 12 suppository 0  . busPIRone (BUSPAR) 15 MG  tablet Take 1 tablet (15 mg total) by mouth 3 (three) times daily. 270 tablet 3  . carvedilol (COREG) 25 MG tablet TAKE 1 TABLET BY MOUTH TWICE DAILY 180 tablet 0  . clopidogrel (PLAVIX) 75 MG tablet Take 1 tablet (75 mg total) by mouth daily. 90 tablet 3  . diclofenac sodium (VOLTAREN) 1 % GEL Apply 2 g topically as needed (muscle spasms).    . docusate sodium (COLACE) 100 MG capsule Take 1 capsule (100 mg total) by mouth 2 (two) times daily. 10 capsule 0  . exenatide (BYETTA 5 MCG PEN) 5 MCG/0.02ML SOPN injection Inject 0.02 milliliters (92m31mtotal) subcutaneously twice a day with meals 3.6 mL 0  . ezetimibe (ZETIA) 10 MG tablet Take 1 tablet (10 mg total) by mouth daily. 90 tablet 3  . gabapentin (NEURONTIN) 300 MG capsule Take 1 capsule (300 mg total) by mouth 2 (two) times daily.    . insulin aspart (NOVOLOG) 100 UNIT/ML FlexPen Inject 10 Units into the skin 3 (three) times daily before meals. Take only if blood sugar is over 300 15 mL 3  . Insulin NPH, Human,, Isophane, (HUMULIN N KWIKPEN) 100 UNIT/ML  Kiwkpen Inject 220 Units into the skin daily before lunch. And pen needles 4/day.. 30 pen 11  . Insulin Pen Needle (BD ULTRA-FINE PEN NEEDLES) 29G X 12.7MM MISC USE AS DIRECTED 200 each 0  . INSULIN SYRINGE 1CC/29G (B-D INS SYR ULTRAFINE 1CC/29G) 29G X 1/2" 1 ML MISC Used to give insulin injections 4x daily. 200 each 11  . morphine (MSIR) 15 MG tablet Take 1 tablet (15 mg total) by mouth every 8 (eight) hours as needed for moderate pain. 10 tablet 0  . polyethylene glycol (MIRALAX / GLYCOLAX) packet Take 17 g by mouth daily as needed for mild constipation. 14 each 0  . ranolazine (RANEXA) 1000 MG SR tablet Take 1 tablet (1,000 mg total) by mouth 2 (two) times daily. 180 tablet 3  . sertraline (ZOLOFT) 50 MG tablet Take 1 tablet (50 mg total) by mouth daily. 90 tablet 3  . methocarbamol (ROBAXIN) 750 MG tablet Take 1 tablet (750 mg total) by mouth every 6 (six) hours as needed for muscle spasms.  (Patient not taking: Reported on 09/09/2017) 14 tablet 0  . morphine (MS CONTIN) 30 MG 12 hr tablet Take 1 tablet (30 mg total) by mouth every 8 (eight) hours as needed. (Patient not taking: Reported on 09/09/2017) 8 tablet 0   No current facility-administered medications for this visit.     Allergies:  Allergies  Allergen Reactions  . Furosemide Other (See Comments)    Dehydration leading to kidney failure  "my kidneys shutdown"  . Norvasc [Amlodipine Besylate] Swelling    "Severe edema"  . Prednisone Other (See Comments)    Ketoacidosis  . Vancomycin Other (See Comments)    Total organ shut down "It shut my kidneys down"  . Amlodipine Swelling    SWELLING/EDEMA REACTION UNSPECIFIED   . Clindamycin/Lincomycin Hives and Itching  . Doxycycline Hives and Rash  . Propranolol Cough    Bronchospasms  . Ibuprofen Other (See Comments)    WAS TOLD TO NOT TAKE THIS BECAUSE OF HER KIDNEYS AND LIVER  . Lisinopril Other (See Comments)    WAS TOLD TO NOT TAKE THIS BECAUSE OF HER KIDNEYS AND LIVER  . Lovastatin Other (See Comments)    WAS TOLD TO NOT TAKE THIS BECAUSE OF HER KIDNEYS AND LIVER  . Norvasc [Amlodipine Besylate] Other (See Comments)    Severe edema  . Nsaids Other (See Comments)    WAS TOLD TO NOT TAKE THIS BECAUSE OF HER KIDNEYS AND LIVER  . Sulfa Antibiotics Nausea Only    Past Surgical History:  Procedure Laterality Date  . ABDOMINAL HYSTERECTOMY    . AMPUTATION Bilateral 05/12/2017   Procedure: BILATERAL GREAT TOE AMPUTATION;  Surgeon: Newt Minion, MD;  Location: [facility];  Service: Orthopedics;  Laterality: Bilateral;  . BACK SURGERY    . CESAREAN SECTION    . CHOLECYSTECTOMY    . HERNIA REPAIR    . JOINT REPLACEMENT    . KNEE SURGERY    . SPINE SURGERY    . TOE AMPUTATION    . TOE AMPUTATION Bilateral    3 on right foot 2 left foot   . TONSILLECTOMY    . TUBAL LIGATION      Social History   Socioeconomic History  . Marital status: Divorced    Spouse name:  Not on file  . Number of children: 3  . Years of education: Not on file  . Highest education level: Not on file  Occupational History  . Not on  file  Social Needs  . Financial resource strain: Not on file  . Food insecurity:    Worry: Not on file    Inability: Not on file  . Transportation needs:    Medical: Not on file    Non-medical: Not on file  Tobacco Use  . Smoking status: Never Smoker  . Smokeless tobacco: Never Used  Substance and Sexual Activity  . Alcohol use: No  . Drug use: No  . Sexual activity: Never  Lifestyle  . Physical activity:    Days per week: Not on file    Minutes per session: Not on file  . Stress: Not on file  Relationships  . Social connections:    Talks on phone: Not on file    Gets together: Not on file    Attends religious service: Not on file    Active member of club or organization: Not on file    Attends meetings of clubs or organizations: Not on file    Relationship status: Not on file  Other Topics Concern  . Not on file  Social History Narrative   ** Merged History Encounter **        Family History  Problem Relation Age of Onset  . Cancer Mother        Thymoma, metastatic  . Heart disease Father   . Stroke Father   . Stroke Maternal Grandfather   . Heart disease Paternal Grandfather   . Multiple sclerosis Sister   . Epilepsy Sister   . Diabetes Neg Hx      ROS Review of Systems See HPI Constitution: No fevers or chills No malaise No diaphoresis Skin: No rash or itching Eyes: no blurry vision, no double vision GU: no dysuria or hematuria Neuro: no dizziness or headaches all others reviewed and negative   Objective: Vitals:   09/09/17 1151  BP: 117/73  Pulse: 95  Resp: 20  Temp: 98.6 F (37 C)  TempSrc: Oral  SpO2: 96%  Weight: 230 lb 12.8 oz (104.7 kg)  Height: 5' 5.47" (1.663 m)    Physical Exam  Constitutional: She is oriented to person, place, and time. She appears well-developed and  well-nourished.  HENT:  Head: Normocephalic and atraumatic.  Eyes: Conjunctivae and EOM are normal.  Neck: Normal range of motion. Neck supple.  Cardiovascular: Normal rate, regular rhythm and normal heart sounds.  No murmur heard. Pulmonary/Chest: Effort normal and breath sounds normal. No stridor. No respiratory distress. She has no wheezes.  Musculoskeletal: Normal range of motion. She exhibits no edema.  Neurological: She is alert and oriented to person, place, and time.  Skin: Skin is warm. Capillary refill takes less than 2 seconds.  Psychiatric: She has a normal mood and affect. Her behavior is normal. Judgment and thought content normal.    Assessment and Plan [place] was seen today for diabetes and memory issues.  Diagnoses and all orders for this visit:  Phantom limb pain ([place])- improved with gabapentin   Diabetic peripheral neuropathy ([place])- discussed gabapentin for neuropathy but discussed diabetes control to improve neuropathy  Kidney disease, chronic, stage III (GFR 30-59 ml/min) ([place])-  Discussed hydrating, compliance and understanding her opiates will build up in her system  She will discuss with pain mgmt No longer on ms contin xr  Essential hypertension- Patient's blood pressure is at goal of 139/89 or less. Condition is stable. Continue current medications and treatment plan. I recommend that you exercise for 30-45 minutes 5 days a week. I  also recommend a balanced diet with fruits and vegetables every day, lean meats, and little fried foods. The DASH diet (you can find this online) is a good example of this.   Uncontrolled type 2 diabetes mellitus with hyperglycemia ([place])-  Still needs to improve her dietary compliance Pt on high dose insulins   A total of 40 minutes were spent face-to-face with the patient during this encounter and over half of that time was spent on counseling and coordination of care.   [place]

## 2017-09-16 ENCOUNTER — Ambulatory Visit: Payer: Self-pay | Admitting: Family Medicine

## 2017-09-16 NOTE — Telephone Encounter (Signed)
Patient's daughter is calling with concerns about her mother- mother is lethargic and will " zone out". Patient was taken to [facility]- for these symptoms- and patient was sent home.Patient is concerned that her mother may have adverse reaction to Morphine as she had in the past- mother is not spreading out her medications- takes all of them in the morning. She has not signed up for outside activities- she is not eating well during the day. Patient is having tremors- patient needs assistance to ambulate. Patient denies she is having problems. Primidone 50 mg- was discontinued by hospital. Patient's daughter is asking for help- patient needs hospital follow up. Appointment scheduled for Saturday per office permission.  Reason for Disposition . [1] Weakness of arm / hand, or leg / foot AND [2] is a chronic symptom (recurrent or ongoing AND present > 4 weeks)  Answer Assessment - Initial Assessment Questions 1. SYMPTOM: "What is the main symptom you are concerned about?" (e.g., weakness, numbness)     Weakness, zoning out 2. ONSET: "When did this start?" (minutes, hours, days; while sleeping)     Since back on Morphine it has gotten worse- April/May 3. LAST NORMAL: "When was the last time you were normal (no symptoms)?"     The change in medication has changed her responsiveness- daughter is worried about kidney failure 4. PATTERN "Does this come and go, or has it been constant since it started?"  "Is it present now?"     Comes and goes 5. CARDIAC SYMPTOMS: "Have you had any of the following symptoms: chest pain, difficulty breathing, palpitations?"     Palpitations- angina history 6. NEUROLOGIC SYMPTOMS: "Have you had any of the following symptoms: headache, dizziness, vision loss, double vision, changes in speech, unsteady on your feet?"     Yesterday was a bad day 7. OTHER SYMPTOMS: "Do you have any other symptoms?"     no 8. PREGNANCY: "Is there any chance you are pregnant?" "When was your  last menstrual period?"     n/a  Protocols used: NEUROLOGIC DEFICIT-A-AH

## 2017-09-18 ENCOUNTER — Ambulatory Visit: Payer: Self-pay | Admitting: Family Medicine

## 2017-09-20 ENCOUNTER — Telehealth: Payer: Self-pay | Admitting: Family Medicine

## 2017-09-20 NOTE — Telephone Encounter (Signed)
Copied from [place] [id number]. Topic: Quick Communication - See Telephone Encounter >> Sep 20, 2017 10:21 AM Bea Graff, NT wrote: CRM for notification. See Telephone encounter for: 09/20/17. Becky with [facility] calling to get orders to discharge this pt from home health. CB#: [phone]

## 2017-09-22 ENCOUNTER — Ambulatory Visit: Payer: Medicare HMO | Admitting: Physician Assistant

## 2017-09-23 ENCOUNTER — Other Ambulatory Visit: Payer: Self-pay

## 2017-09-23 ENCOUNTER — Telehealth: Payer: Self-pay

## 2017-09-23 ENCOUNTER — Ambulatory Visit (INDEPENDENT_AMBULATORY_CARE_PROVIDER_SITE_OTHER): Payer: Medicare HMO | Admitting: Emergency Medicine

## 2017-09-23 ENCOUNTER — Encounter: Payer: Self-pay | Admitting: Emergency Medicine

## 2017-09-23 VITALS — BP 109/63 | HR 78 | Temp 98.1°F | Resp 16 | Ht 65.0 in | Wt 236.6 lb

## 2017-09-23 DIAGNOSIS — E1165 Type 2 diabetes mellitus with hyperglycemia: Secondary | ICD-10-CM

## 2017-09-23 DIAGNOSIS — I10 Essential (primary) hypertension: Secondary | ICD-10-CM

## 2017-09-23 DIAGNOSIS — R197 Diarrhea, unspecified: Secondary | ICD-10-CM

## 2017-09-23 DIAGNOSIS — N183 Chronic kidney disease, stage 3 unspecified: Secondary | ICD-10-CM

## 2017-09-23 DIAGNOSIS — Z789 Other specified health status: Secondary | ICD-10-CM

## 2017-09-23 DIAGNOSIS — E1142 Type 2 diabetes mellitus with diabetic polyneuropathy: Secondary | ICD-10-CM

## 2017-09-23 DIAGNOSIS — R531 Weakness: Secondary | ICD-10-CM

## 2017-09-23 NOTE — Telephone Encounter (Signed)
Daughter [name] in office today with questions about how to get mother placed in a facility.  Pt seen by [facility] today for diarrhea.  [name] spoke with Wilfred Curtis about her concerns regarding her mom's non compliance  And she's afraid for her due to her non compliance.  Shannon sent back up to 102 to let Dr Mitchel Honour know of her concerns.  Ms.  Stanton Kidney called down regarding pt.  I told Ms. Mary to let [name] know myself or Dr Nolon Rod would give her a call today.  Spoke with [name] an advised she will need to come for ov to discuss ? Placement of her mom in a facility/noncompliance.  Advised nothing can be done without first talking with provider so she can guide her through this process if placement is needed and more counseling and education on medication and diet compliance.  [name] agreeable, offered appt on 8/28 and is unable to make this date.  Pt requesting Monday 09/27/17 as she declined Monday while in office.  Appt scheduled for Monday 09/27/17 at 11:40 am.  She will be the last pt of the day as 12:00 slot is blocked. Dgaddy, CMA

## 2017-09-23 NOTE — Progress Notes (Signed)
[name] 62 y.o.   Chief Complaint  Patient presents with  . Diarrhea    x 3 days  per patient has gotten better  . Extremity Weakness    both legs x 2 weeks per patient has gotten worse    HISTORY OF PRESENT ILLNESS: This is a 62 y.o. female with multiple medical problems today complaining of diarrhea 3 days ago that is now much better.  Also complaining of chronic lower extremity weakness worse the past several weeks as per daughter.  Patient denies any new symptoms.  Uses a walker most of the time. Patient is a brittle diabetic. No other significant acute symptoms. Daughter concerned about lack of help at home when she is out working and mother is by herself at home.  Requesting home health support.  HPI   Prior to Admission medications   Medication Sig Start Date End Date Taking? Authorizing Provider  atorvastatin (LIPITOR) 80 MG tablet Take 1 tablet (80 mg total) by mouth daily at 6 PM. 02/08/17  Yes Stallings, Zoe A, MD  busPIRone (BUSPAR) 15 MG tablet Take 1 tablet (15 mg total) by mouth 3 (three) times daily. 07/19/17  Yes Stallings, Zoe A, MD  carvedilol (COREG) 25 MG tablet TAKE 1 TABLET BY MOUTH TWICE DAILY 07/16/17  Yes Stallings, Zoe A, MD  clopidogrel (PLAVIX) 75 MG tablet Take 1 tablet (75 mg total) by mouth daily. 02/08/17  Yes Stallings, Zoe A, MD  exenatide (BYETTA 5 MCG PEN) 5 MCG/0.02ML SOPN injection Inject 0.02 milliliters (29mg total) subcutaneously twice a day with meals 07/06/17  Yes Stallings, Zoe A, MD  ezetimibe (ZETIA) 10 MG tablet Take 1 tablet (10 mg total) by mouth daily. 02/08/17  Yes SDelia ChimesA, MD  gabapentin (NEURONTIN) 300 MG capsule Take 1 capsule (300 mg total) by mouth 2 (two) times daily. 05/15/17  Yes AAline August MD  insulin aspart (NOVOLOG) 100 UNIT/ML FlexPen Inject 10 Units into the skin 3 (three) times daily before meals. Take only if blood sugar is over 300 04/29/17  Yes ERenato Shin MD  Insulin NPH, Human,, Isophane, (HUMULIN N  KWIKPEN) 100 UNIT/ML Kiwkpen Inject 220 Units into the skin daily before lunch. And pen needles 4/day.. 07/27/17  Yes ERenato Shin MD  Insulin Pen Needle (BD ULTRA-FINE PEN NEEDLES) 29G X 12.7MM MISC USE AS DIRECTED 07/21/17  Yes Stallings, Zoe A, MD  INSULIN SYRINGE 1CC/29G (B-D INS SYR ULTRAFINE 1CC/29G) 29G X 1/2" 1 ML MISC Used to give insulin injections 4x daily. 04/30/17  Yes ERenato Shin MD  morphine (MSIR) 15 MG tablet Take 1 tablet (15 mg total) by mouth every 8 (eight) hours as needed for moderate pain. 05/15/17  Yes AAline August MD  ranolazine (RANEXA) 1000 MG SR tablet Take 1 tablet (1,000 mg total) by mouth 2 (two) times daily. 04/13/17  Yes KPark Liter MD  sertraline (ZOLOFT) 50 MG tablet Take 1 tablet (50 mg total) by mouth daily. 07/19/17  Yes SForrest Moron MD  bisacodyl (DULCOLAX) 10 MG suppository Place 1 suppository (10 mg total) rectally daily as needed for moderate constipation. Patient not taking: Reported on 09/23/2017 05/15/17   AAline August MD  diclofenac sodium (VOLTAREN) 1 % GEL Apply 2 g topically as needed (muscle spasms).    [provider]  docusate sodium (COLACE) 100 MG capsule Take 1 capsule (100 mg total) by mouth 2 (two) times daily. 05/15/17   AAline August MD  methocarbamol (ROBAXIN) 750 MG tablet Take 1 tablet (  750 mg total) by mouth every 6 (six) hours as needed for muscle spasms. Patient not taking: Reported on 09/09/2017 05/15/17   Aline August, MD  morphine (MS CONTIN) 30 MG 12 hr tablet Take 1 tablet (30 mg total) by mouth every 8 (eight) hours as needed. Patient not taking: Reported on 09/09/2017 05/15/17   Aline August, MD  polyethylene glycol (MIRALAX / GLYCOLAX) packet Take 17 g by mouth daily as needed for mild constipation. Patient not taking: Reported on 09/23/2017 05/15/17   Aline August, MD    Allergies  Allergen Reactions  . Furosemide Other (See Comments)    Dehydration leading to kidney failure  "my kidneys shutdown"   . Norvasc [Amlodipine Besylate] Swelling    "Severe edema"  . Prednisone Other (See Comments)    Ketoacidosis  . Vancomycin Other (See Comments)    Total organ shut down "It shut my kidneys down"  . Amlodipine Swelling    SWELLING/EDEMA REACTION UNSPECIFIED   . Clindamycin/Lincomycin Hives and Itching  . Doxycycline Hives and Rash  . Propranolol Cough    Bronchospasms  . Ibuprofen Other (See Comments)    WAS TOLD TO NOT TAKE THIS BECAUSE OF HER KIDNEYS AND LIVER  . Lisinopril Other (See Comments)    WAS TOLD TO NOT TAKE THIS BECAUSE OF HER KIDNEYS AND LIVER  . Lovastatin Other (See Comments)    WAS TOLD TO NOT TAKE THIS BECAUSE OF HER KIDNEYS AND LIVER  . Norvasc [Amlodipine Besylate] Other (See Comments)    Severe edema  . Nsaids Other (See Comments)    WAS TOLD TO NOT TAKE THIS BECAUSE OF HER KIDNEYS AND LIVER  . Sulfa Antibiotics Nausea Only    Patient Active Problem List   Diagnosis Date Noted  . History of amputation of right great toe ([place]) 06/23/2017  . Edema of right lower extremity due to peripheral venous insufficiency 06/23/2017  . Phantom limb pain ([place]) 06/14/2017  . Acute encephalopathy 05/11/2017  . Osteomyelitis of great toe of right foot ([place]) 05/06/2017  . Osteomyelitis of great toe of left foot ([place]) 05/06/2017  . Community acquired pneumonia 04/04/2017  . Angina pectoris ([place]) 03/08/2017  . Aortic atherosclerosis ([place]) 01/13/2017  . At high risk for falls 01/13/2017  . Stroke-like symptoms   . Complicated migraine   . Mixed hyperlipidemia   . Generalized weakness   . TIA (transient ischemic attack) 01/02/2017  . Ulcer of toe of right foot, limited to breakdown of skin ([place]) 12/18/2016  . Non-pressure chronic ulcer of other part of right foot limited to breakdown of skin ([place]) 12/07/2016  . Diabetic foot ulcer ([place]) 12/04/2016  . Hypovolemia 12/04/2016  . Chronic pain syndrome 12/04/2016  . Diabetic peripheral neuropathy ([place]) 12/04/2016  .  Hypotension   . Liver mass 09/20/2016  . Kidney disease, chronic, stage III (GFR 30-59 ml/min) ([facility]) 12/17/2015  . Diabetes ([place]) 07/18/2015  . Essential hypertension 06/06/2015  . Chronic back pain 07/27/2012  . OSA on CPAP 03/21/2012    Past Medical History:  Diagnosis Date  . Allergy   . Arthritis   . Chronic kidney disease   . Chronic pain   . Coronary artery disease   . Diabetes mellitus without complication ([place])   . Dyspnea   . GERD (gastroesophageal reflux disease)   . Heart murmur   . Hypertension   . Liver disease   . NASH (nonalcoholic steatohepatitis) 09/16/2016  . Obstructive sleep apnea   . Osteomyelitis ([place])   .  Peripheral vascular disease ([place])   . Sleep apnea   . Stroke ([place])   . Ulcers of both great toes ([place]) 11/07/2016   right great toe, started as finger nail size and grew in 6 days     Past Surgical History:  Procedure Laterality Date  . ABDOMINAL HYSTERECTOMY    . AMPUTATION Bilateral 05/12/2017   Procedure: BILATERAL GREAT TOE AMPUTATION;  Surgeon: Newt Minion, MD;  Location: [facility];  Service: Orthopedics;  Laterality: Bilateral;  . BACK SURGERY    . CESAREAN SECTION    . CHOLECYSTECTOMY    . HERNIA REPAIR    . JOINT REPLACEMENT    . KNEE SURGERY    . SPINE SURGERY    . TOE AMPUTATION    . TOE AMPUTATION Bilateral    3 on right foot 2 left foot   . TONSILLECTOMY    . TUBAL LIGATION      Social History   Socioeconomic History  . Marital status: Divorced    Spouse name: Not on file  . Number of children: 3  . Years of education: Not on file  . Highest education level: Not on file  Occupational History  . Not on file  Social Needs  . Financial resource strain: Not on file  . Food insecurity:    Worry: Not on file    Inability: Not on file  . Transportation needs:    Medical: Not on file    Non-medical: Not on file  Tobacco Use  . Smoking status: Never Smoker  . Smokeless tobacco: Never Used  Substance and Sexual Activity    . Alcohol use: No  . Drug use: No  . Sexual activity: Never  Lifestyle  . Physical activity:    Days per week: Not on file    Minutes per session: Not on file  . Stress: Not on file  Relationships  . Social connections:    Talks on phone: Not on file    Gets together: Not on file    Attends religious service: Not on file    Active member of club or organization: Not on file    Attends meetings of clubs or organizations: Not on file    Relationship status: Not on file  . Intimate partner violence:    Fear of current or ex partner: Not on file    Emotionally abused: Not on file    Physically abused: Not on file    Forced sexual activity: Not on file  Other Topics Concern  . Not on file  Social History Narrative   ** Merged History Encounter **        Family History  Problem Relation Age of Onset  . Cancer Mother        Thymoma, metastatic  . Heart disease Father   . Stroke Father   . Stroke Maternal Grandfather   . Heart disease Paternal Grandfather   . Multiple sclerosis Sister   . Epilepsy Sister   . Diabetes Neg Hx      Review of Systems  Constitutional: Negative for chills and fever.  HENT: Negative.  Negative for sore throat.   Eyes: Negative.   Respiratory: Negative.  Negative for cough and shortness of breath.   Cardiovascular: Negative.  Negative for chest pain and palpitations.  Gastrointestinal: Positive for diarrhea (gone). Negative for abdominal pain, blood in stool, nausea and vomiting.  Genitourinary: Negative.  Negative for dysuria and hematuria.  Skin: Negative.  Negative for rash.  Neurological: Negative.  Negative for dizziness and headaches.  Endo/Heme/Allergies: Negative.   All other systems reviewed and are negative.   Vitals:   09/23/17 1101  BP: 109/63  Pulse: 78  Resp: 16  Temp: 98.1 F (36.7 C)  SpO2: 94%    Physical Exam  Constitutional: She is oriented to person, place, and time. She appears well-developed and  well-nourished.  HENT:  Head: Normocephalic and atraumatic.  Mouth/Throat: Oropharynx is clear and moist.  Eyes: Pupils are equal, round, and reactive to light. Conjunctivae and EOM are normal.  Neck: Normal range of motion. Neck supple.  Cardiovascular: Normal rate, regular rhythm and normal heart sounds.  Pulmonary/Chest: Effort normal and breath sounds normal.  Abdominal: Soft. There is no tenderness.  Musculoskeletal: Normal range of motion.  Neurological: She is alert and oriented to person, place, and time. No sensory deficit. She exhibits normal muscle tone.  Skin: Skin is warm and dry. Capillary refill takes less than 2 seconds.  Psychiatric: She has a normal mood and affect. Her behavior is normal.  Vitals reviewed.    ASSESSMENT & PLAN: Larkin was seen today for diarrhea and extremity weakness.  Diagnoses and all orders for this visit:  Diarrhea of presumed infectious origin  Diabetic peripheral neuropathy ([place]) -     Ambulatory referral to Home Health  Kidney disease, chronic, stage III (GFR 30-59 ml/min) ([place])  Uncontrolled type 2 diabetes mellitus with hyperglycemia ([place]) -     Ambulatory referral to [place]  Medically complex patient -     Ambulatory referral to [place] hypertension  General weakness -     Ambulatory referral to [place]    Patient Instructions       If you have lab work done today you will be contacted with your lab results within the next 2 weeks.  If you have not heard from [place] then please contact us. The fastest way to get your results is to register for My Chart.   IF you received an x-ray today, you will receive an invoice from [facility]. Please contact [facility] at [phone] with questions or concerns regarding your invoice.   IF you received labwork today, you will receive an invoice from [facility]. Please contact [facility] at [phone] with questions or concerns regarding your  invoice.   Our billing staff will not be able to assist you with questions regarding bills from these companies.  You will be contacted with the lab results as soon as they are available. The fastest way to get your results is to activate your My Chart account. Instructions are located on the last page of this paperwork. If you have not heard from [place] regarding the results in 2 weeks, please contact this office.     Diarrhea, Adult Diarrhea is when you have loose and water poop (stool) often. Diarrhea can make you feel weak and cause you to get dehydrated. Dehydration can make you tired and thirsty, make you have a dry mouth, and make it so you pee (urinate) less often. Diarrhea often lasts 2-3 days. However, it can last longer if it is a sign of something more serious. It is important to treat your diarrhea as told by your doctor. Follow these instructions at home: Eating and drinking  Follow these recommendations as told by your doctor:  Take an oral rehydration solution (ORS). This is a drink that is sold at pharmacies and stores.  Drink clear fluids, such as: ? Water. ?  Ice chips. ? Diluted fruit juice. ? Low-calorie sports drinks.  Eat bland, easy-to-digest foods in small amounts as you are able. These foods include: ? Bananas. ? Applesauce. ? Rice. ? Low-fat (lean) meats. ? Toast. ? Crackers.  Avoid drinking fluids that have a lot of sugar or caffeine in them.  Avoid alcohol.  Avoid spicy or fatty foods.  General instructions   Drink enough fluid to keep your pee (urine) clear or pale yellow.  Wash your hands often. If you cannot use soap and water, use hand sanitizer.  Make sure that all people in your home wash their hands well and often.  Take over-the-counter and prescription medicines only as told by your doctor.  Rest at home while you get better.  Watch your condition for any changes.  Take a warm bath to help with any burning or pain from having  diarrhea.  Keep all follow-up visits as told by your doctor. This is important. Contact a doctor if:  You have a fever.  Your diarrhea gets worse.  You have new symptoms.  You cannot keep fluids down.  You feel light-headed or dizzy.  You have a headache.  You have muscle cramps. Get help right away if:  You have chest pain.  You feel very weak or you pass out (faint).  You have bloody or black poop or poop that look like tar.  You have very bad pain, cramping, or bloating in your belly (abdomen).  You have trouble breathing or you are breathing very quickly.  Your heart is beating very quickly.  Your skin feels cold and clammy.  You feel confused.  You have signs of dehydration, such as: ? Dark pee, hardly any pee, or no pee. ? Cracked lips. ? Dry mouth. ? Sunken eyes. ? Sleepiness. ? Weakness. This information is not intended to replace advice given to you by your health care provider. Make sure you discuss any questions you have with your health care provider. Document Released: 07/08/2007 Document Revised: 08/09/2015 Document Reviewed: 09/25/2014 Elsevier Interactive Patient Education  2018 [employer].  Diabetes Mellitus and Nutrition When you have diabetes (diabetes mellitus), it is very important to have healthy eating habits because your blood sugar (glucose) levels are greatly affected by what you eat and drink. Eating healthy foods in the appropriate amounts, at about the same times every day, can help you:  Control your blood glucose.  Lower your risk of heart disease.  Improve your blood pressure.  Reach or maintain a healthy weight.  Every person with diabetes is different, and each person has different needs for a meal plan. Your health care provider may recommend that you work with a diet and nutrition specialist (dietitian) to make a meal plan that is best for you. Your meal plan may vary depending on factors such as:  The calories you  need.  The medicines you take.  Your weight.  Your blood glucose, blood pressure, and cholesterol levels.  Your activity level.  Other health conditions you have, such as heart or kidney disease.  How do carbohydrates affect me? Carbohydrates affect your blood glucose level more than any other type of food. Eating carbohydrates naturally increases the amount of glucose in your blood. Carbohydrate counting is a method for keeping track of how many carbohydrates you eat. Counting carbohydrates is important to keep your blood glucose at a healthy level, especially if you use insulin or take certain oral diabetes medicines. It is important to know how many  carbohydrates you can safely have in each meal. This is different for every person. Your dietitian can help you calculate how many carbohydrates you should have at each meal and for snack. Foods that contain carbohydrates include:  Bread, cereal, rice, pasta, and crackers.  Potatoes and corn.  Peas, beans, and lentils.  Milk and yogurt.  Fruit and juice.  Desserts, such as cakes, cookies, ice cream, and candy.  How does alcohol affect me? Alcohol can cause a sudden decrease in blood glucose (hypoglycemia), especially if you use insulin or take certain oral diabetes medicines. Hypoglycemia can be a life-threatening condition. Symptoms of hypoglycemia (sleepiness, dizziness, and confusion) are similar to symptoms of having too much alcohol. If your health care provider says that alcohol is safe for you, follow these guidelines:  Limit alcohol intake to no more than 1 drink per day for nonpregnant women and 2 drinks per day for men. One drink equals 12 oz of beer, 5 oz of wine, or 1 oz of hard liquor.  Do not drink on an empty stomach.  Keep yourself hydrated with water, diet soda, or unsweetened iced tea.  Keep in mind that regular soda, juice, and other mixers may contain a lot of sugar and must be counted as  carbohydrates.  What are tips for following this plan? Reading food labels  Start by checking the serving size on the label. The amount of calories, carbohydrates, fats, and other nutrients listed on the label are based on one serving of the food. Many foods contain more than one serving per package.  Check the total grams (g) of carbohydrates in one serving. You can calculate the number of servings of carbohydrates in one serving by dividing the total carbohydrates by 15. For example, if a food has 30 g of total carbohydrates, it would be equal to 2 servings of carbohydrates.  Check the number of grams (g) of saturated and trans fats in one serving. Choose foods that have low or no amount of these fats.  Check the number of milligrams (mg) of sodium in one serving. Most people should limit total sodium intake to less than 2,300 mg per day.  Always check the nutrition information of foods labeled as "low-fat" or "nonfat". These foods may be higher in added sugar or refined carbohydrates and should be avoided.  Talk to your dietitian to identify your daily goals for nutrients listed on the label. Shopping  Avoid buying canned, premade, or processed foods. These foods tend to be high in fat, sodium, and added sugar.  Shop around the outside edge of the grocery store. This includes fresh fruits and vegetables, bulk grains, fresh meats, and fresh dairy. Cooking  Use low-heat cooking methods, such as baking, instead of high-heat cooking methods like deep frying.  Cook using healthy oils, such as olive, canola, or sunflower oil.  Avoid cooking with butter, cream, or high-fat meats. Meal planning  Eat meals and snacks regularly, preferably at the same times every day. Avoid going long periods of time without eating.  Eat foods high in fiber, such as fresh fruits, vegetables, beans, and whole grains. Talk to your dietitian about how many servings of carbohydrates you can eat at each  meal.  Eat 4-6 ounces of lean protein each day, such as lean meat, chicken, fish, eggs, or tofu. 1 ounce is equal to 1 ounce of meat, chicken, or fish, 1 egg, or 1/4 cup of tofu.  Eat some foods each day that contain healthy fats, such  as avocado, nuts, seeds, and fish. Lifestyle   Check your blood glucose regularly.  Exercise at least 30 minutes 5 or more days each week, or as told by your health care provider.  Take medicines as told by your health care provider.  Do not use any products that contain nicotine or tobacco, such as cigarettes and e-cigarettes. If you need help quitting, ask your health care provider.  Work with a Social worker or diabetes educator to identify strategies to manage stress and any emotional and social challenges. What are some questions to ask my health care provider?  Do I need to meet with a diabetes educator?  Do I need to meet with a dietitian?  What number can I call if I have questions?  When are the best times to check my blood glucose? Where to find more information:  American Diabetes Association: diabetes.org/food-and-fitness/food  Academy of Nutrition and Dietetics: PokerClues.dk  [employer] of Diabetes and Digestive and Kidney Diseases (NIH): ContactWire.be Summary  A healthy meal plan will help you control your blood glucose and maintain a healthy lifestyle.  Working with a diet and nutrition specialist (dietitian) can help you make a meal plan that is best for you.  Keep in mind that carbohydrates and alcohol have immediate effects on your blood glucose levels. It is important to count carbohydrates and to use alcohol carefully. This information is not intended to replace advice given to you by your health care provider. Make sure you discuss any questions you have with your health care provider. Document  Released: 10/16/2004 Document Revised: 02/24/2016 Document Reviewed: 02/24/2016 Elsevier Interactive Patient Education  2018 [employer].      Agustina Caroli, MD [facility]

## 2017-09-23 NOTE — Patient Instructions (Addendum)
If you have lab work done today you will be contacted with your lab results within the next 2 weeks.  If you have not heard from [place] then please contact us. The fastest way to get your results is to register for My Chart.   IF you received an x-ray today, you will receive an invoice from [facility]. Please contact [facility] at [phone] with questions or concerns regarding your invoice.   IF you received labwork today, you will receive an invoice from [facility]. Please contact [facility] at [phone] with questions or concerns regarding your invoice.   Our billing staff will not be able to assist you with questions regarding bills from these companies.  You will be contacted with the lab results as soon as they are available. The fastest way to get your results is to activate your My Chart account. Instructions are located on the last page of this paperwork. If you have not heard from [place] regarding the results in 2 weeks, please contact this office.     Diarrhea, Adult Diarrhea is when you have loose and water poop (stool) often. Diarrhea can make you feel weak and cause you to get dehydrated. Dehydration can make you tired and thirsty, make you have a dry mouth, and make it so you pee (urinate) less often. Diarrhea often lasts 2-3 days. However, it can last longer if it is a sign of something more serious. It is important to treat your diarrhea as told by your doctor. Follow these instructions at home: Eating and drinking  Follow these recommendations as told by your doctor:  Take an oral rehydration solution (ORS). This is a drink that is sold at pharmacies and stores.  Drink clear fluids, such as: ? Water. ? Ice chips. ? Diluted fruit juice. ? Low-calorie sports drinks.  Eat bland, easy-to-digest foods in small amounts as you are able. These foods include: ? Bananas. ? Applesauce. ? Rice. ? Low-fat (lean) meats. ? Toast. ? Crackers.  Avoid drinking  fluids that have a lot of sugar or caffeine in them.  Avoid alcohol.  Avoid spicy or fatty foods.  General instructions   Drink enough fluid to keep your pee (urine) clear or pale yellow.  Wash your hands often. If you cannot use soap and water, use hand sanitizer.  Make sure that all people in your home wash their hands well and often.  Take over-the-counter and prescription medicines only as told by your doctor.  Rest at home while you get better.  Watch your condition for any changes.  Take a warm bath to help with any burning or pain from having diarrhea.  Keep all follow-up visits as told by your doctor. This is important. Contact a doctor if:  You have a fever.  Your diarrhea gets worse.  You have new symptoms.  You cannot keep fluids down.  You feel light-headed or dizzy.  You have a headache.  You have muscle cramps. Get help right away if:  You have chest pain.  You feel very weak or you pass out (faint).  You have bloody or black poop or poop that look like tar.  You have very bad pain, cramping, or bloating in your belly (abdomen).  You have trouble breathing or you are breathing very quickly.  Your heart is beating very quickly.  Your skin feels cold and clammy.  You feel confused.  You have signs of dehydration, such as: ? Dark pee, hardly any pee, or  no pee. ? Cracked lips. ? Dry mouth. ? Sunken eyes. ? Sleepiness. ? Weakness. This information is not intended to replace advice given to you by your health care provider. Make sure you discuss any questions you have with your health care provider. Document Released: 07/08/2007 Document Revised: 08/09/2015 Document Reviewed: 09/25/2014 Elsevier Interactive Patient Education  2018 [employer].  Diabetes Mellitus and Nutrition When you have diabetes (diabetes mellitus), it is very important to have healthy eating habits because your blood sugar (glucose) levels are greatly affected by  what you eat and drink. Eating healthy foods in the appropriate amounts, at about the same times every day, can help you:  Control your blood glucose.  Lower your risk of heart disease.  Improve your blood pressure.  Reach or maintain a healthy weight.  Every person with diabetes is different, and each person has different needs for a meal plan. Your health care provider may recommend that you work with a diet and nutrition specialist (dietitian) to make a meal plan that is best for you. Your meal plan may vary depending on factors such as:  The calories you need.  The medicines you take.  Your weight.  Your blood glucose, blood pressure, and cholesterol levels.  Your activity level.  Other health conditions you have, such as heart or kidney disease.  How do carbohydrates affect me? Carbohydrates affect your blood glucose level more than any other type of food. Eating carbohydrates naturally increases the amount of glucose in your blood. Carbohydrate counting is a method for keeping track of how many carbohydrates you eat. Counting carbohydrates is important to keep your blood glucose at a healthy level, especially if you use insulin or take certain oral diabetes medicines. It is important to know how many carbohydrates you can safely have in each meal. This is different for every person. Your dietitian can help you calculate how many carbohydrates you should have at each meal and for snack. Foods that contain carbohydrates include:  Bread, cereal, rice, pasta, and crackers.  Potatoes and corn.  Peas, beans, and lentils.  Milk and yogurt.  Fruit and juice.  Desserts, such as cakes, cookies, ice cream, and candy.  How does alcohol affect me? Alcohol can cause a sudden decrease in blood glucose (hypoglycemia), especially if you use insulin or take certain oral diabetes medicines. Hypoglycemia can be a life-threatening condition. Symptoms of hypoglycemia (sleepiness, dizziness,  and confusion) are similar to symptoms of having too much alcohol. If your health care provider says that alcohol is safe for you, follow these guidelines:  Limit alcohol intake to no more than 1 drink per day for nonpregnant women and 2 drinks per day for men. One drink equals 12 oz of beer, 5 oz of wine, or 1 oz of hard liquor.  Do not drink on an empty stomach.  Keep yourself hydrated with water, diet soda, or unsweetened iced tea.  Keep in mind that regular soda, juice, and other mixers may contain a lot of sugar and must be counted as carbohydrates.  What are tips for following this plan? Reading food labels  Start by checking the serving size on the label. The amount of calories, carbohydrates, fats, and other nutrients listed on the label are based on one serving of the food. Many foods contain more than one serving per package.  Check the total grams (g) of carbohydrates in one serving. You can calculate the number of servings of carbohydrates in one serving by dividing the total  carbohydrates by 15. For example, if a food has 30 g of total carbohydrates, it would be equal to 2 servings of carbohydrates.  Check the number of grams (g) of saturated and trans fats in one serving. Choose foods that have low or no amount of these fats.  Check the number of milligrams (mg) of sodium in one serving. Most people should limit total sodium intake to less than 2,300 mg per day.  Always check the nutrition information of foods labeled as "low-fat" or "nonfat". These foods may be higher in added sugar or refined carbohydrates and should be avoided.  Talk to your dietitian to identify your daily goals for nutrients listed on the label. Shopping  Avoid buying canned, premade, or processed foods. These foods tend to be high in fat, sodium, and added sugar.  Shop around the outside edge of the grocery store. This includes fresh fruits and vegetables, bulk grains, fresh meats, and fresh  dairy. Cooking  Use low-heat cooking methods, such as baking, instead of high-heat cooking methods like deep frying.  Cook using healthy oils, such as olive, canola, or sunflower oil.  Avoid cooking with butter, cream, or high-fat meats. Meal planning  Eat meals and snacks regularly, preferably at the same times every day. Avoid going long periods of time without eating.  Eat foods high in fiber, such as fresh fruits, vegetables, beans, and whole grains. Talk to your dietitian about how many servings of carbohydrates you can eat at each meal.  Eat 4-6 ounces of lean protein each day, such as lean meat, chicken, fish, eggs, or tofu. 1 ounce is equal to 1 ounce of meat, chicken, or fish, 1 egg, or 1/4 cup of tofu.  Eat some foods each day that contain healthy fats, such as avocado, nuts, seeds, and fish. Lifestyle   Check your blood glucose regularly.  Exercise at least 30 minutes 5 or more days each week, or as told by your health care provider.  Take medicines as told by your health care provider.  Do not use any products that contain nicotine or tobacco, such as cigarettes and e-cigarettes. If you need help quitting, ask your health care provider.  Work with a Social worker or diabetes educator to identify strategies to manage stress and any emotional and social challenges. What are some questions to ask my health care provider?  Do I need to meet with a diabetes educator?  Do I need to meet with a dietitian?  What number can I call if I have questions?  When are the best times to check my blood glucose? Where to find more information:  American Diabetes Association: diabetes.org/food-and-fitness/food  Academy of Nutrition and Dietetics: PokerClues.dk  [employer] of Diabetes and Digestive and Kidney Diseases (NIH): ContactWire.be Summary  A  healthy meal plan will help you control your blood glucose and maintain a healthy lifestyle.  Working with a diet and nutrition specialist (dietitian) can help you make a meal plan that is best for you.  Keep in mind that carbohydrates and alcohol have immediate effects on your blood glucose levels. It is important to count carbohydrates and to use alcohol carefully. This information is not intended to replace advice given to you by your health care provider. Make sure you discuss any questions you have with your health care provider. Document Released: 10/16/2004 Document Revised: 02/24/2016 Document Reviewed: 02/24/2016 Elsevier Interactive Patient Education  [employer].

## 2017-09-27 ENCOUNTER — Other Ambulatory Visit: Payer: Self-pay

## 2017-09-27 ENCOUNTER — Ambulatory Visit (INDEPENDENT_AMBULATORY_CARE_PROVIDER_SITE_OTHER): Payer: Medicare HMO | Admitting: Family Medicine

## 2017-09-27 ENCOUNTER — Encounter: Payer: Self-pay | Admitting: Family Medicine

## 2017-09-27 ENCOUNTER — Ambulatory Visit: Payer: Medicare HMO | Admitting: Endocrinology

## 2017-09-27 VITALS — BP 112/76 | HR 78 | Temp 98.9°F | Resp 20 | Ht 65.04 in | Wt 236.8 lb

## 2017-09-27 DIAGNOSIS — R296 Repeated falls: Secondary | ICD-10-CM

## 2017-09-27 DIAGNOSIS — Z111 Encounter for screening for respiratory tuberculosis: Secondary | ICD-10-CM

## 2017-09-27 DIAGNOSIS — Z91199 Patient's noncompliance with other medical treatment and regimen due to unspecified reason: Secondary | ICD-10-CM

## 2017-09-27 DIAGNOSIS — Z9119 Patient's noncompliance with other medical treatment and regimen: Secondary | ICD-10-CM | POA: Diagnosis not present

## 2017-09-27 DIAGNOSIS — Z789 Other specified health status: Secondary | ICD-10-CM | POA: Diagnosis not present

## 2017-09-27 NOTE — Progress Notes (Signed)
Chief Complaint  Patient presents with  . Follow-up    HPI  Pt reports that her sugar this morning was 244 fasting Yesterday  Morning her sugars were below 100 and she felt like she was bottoming out so she ate a pop tart She states that she was eating good food this whole weekend She reports that she cannot walk across the room without sitting down.  The voltaren and xanaflex   She is here with her daughter [name] She went to [facility] because she was lethargic  She states that her mother was acting like a Zombie and the patient states that she was walking into the wall [name] states that her mother is verbally aggressive and has a "I don't give a damn attitude".  She reports that her mother [name] is at home for 10-12 hours alone at home daily.  The patient [name] states that  She fell at home a week ago and even though she had the rollator she could not stand up. The patient states that she fell and could not get up so she scooted to the bed and picked herself up.    Past Medical History:  Diagnosis Date  . Allergy   . Arthritis   . Chronic kidney disease   . Chronic pain   . Coronary artery disease   . Diabetes mellitus without complication ([place])   . Dyspnea   . GERD (gastroesophageal reflux disease)   . Heart murmur   . Hypertension   . Liver disease   . NASH (nonalcoholic steatohepatitis) 09/16/2016  . Obstructive sleep apnea   . Osteomyelitis ([place])   . Peripheral vascular disease ([place])   . Sleep apnea   . Stroke ([place])   . Ulcers of both great toes ([place]) 11/07/2016   right great toe, started as finger nail size and grew in 6 days     Current Outpatient Medications  Medication Sig Dispense Refill  . atorvastatin (LIPITOR) 80 MG tablet Take 1 tablet (80 mg total) by mouth daily at 6 PM. 90 tablet 3  . busPIRone (BUSPAR) 15 MG tablet Take 1 tablet (15 mg total) by mouth 3 (three) times daily. 270 tablet 3  . carvedilol (COREG) 25 MG tablet TAKE 1  TABLET BY MOUTH TWICE DAILY 180 tablet 0  . clopidogrel (PLAVIX) 75 MG tablet Take 1 tablet (75 mg total) by mouth daily. 90 tablet 3  . diclofenac sodium (VOLTAREN) 1 % GEL Apply 2 g topically as needed (muscle spasms).    . docusate sodium (COLACE) 100 MG capsule Take 1 capsule (100 mg total) by mouth 2 (two) times daily. 10 capsule 0  . exenatide (BYETTA 5 MCG PEN) 5 MCG/0.02ML SOPN injection Inject 0.02 milliliters (27mg total) subcutaneously twice a day with meals 3.6 mL 0  . ezetimibe (ZETIA) 10 MG tablet Take 1 tablet (10 mg total) by mouth daily. 90 tablet 3  . gabapentin (NEURONTIN) 300 MG capsule Take 1 capsule (300 mg total) by mouth 2 (two) times daily.    . insulin aspart (NOVOLOG) 100 UNIT/ML FlexPen Inject 10 Units into the skin 3 (three) times daily before meals. Take only if blood sugar is over 300 15 mL 3  . Insulin NPH, Human,, Isophane, (HUMULIN N KWIKPEN) 100 UNIT/ML Kiwkpen Inject 220 Units into the skin daily before lunch. And pen needles 4/day.. 30 pen 11  . Insulin Pen Needle (BD ULTRA-FINE PEN NEEDLES) 29G X 12.7MM MISC USE AS DIRECTED 200 each 0  .  INSULIN SYRINGE 1CC/29G (B-D INS SYR ULTRAFINE 1CC/29G) 29G X 1/2" 1 ML MISC Used to give insulin injections 4x daily. 200 each 11  . morphine (MSIR) 15 MG tablet Take 1 tablet (15 mg total) by mouth every 8 (eight) hours as needed for moderate pain. 10 tablet 0  . ranolazine (RANEXA) 1000 MG SR tablet Take 1 tablet (1,000 mg total) by mouth 2 (two) times daily. 180 tablet 3  . sertraline (ZOLOFT) 50 MG tablet Take 1 tablet (50 mg total) by mouth daily. 90 tablet 3  . tiZANidine (ZANAFLEX) 4 MG capsule Take 4 mg by mouth 3 (three) times daily.    . bisacodyl (DULCOLAX) 10 MG suppository Place 1 suppository (10 mg total) rectally daily as needed for moderate constipation. (Patient not taking: Reported on 09/23/2017) 12 suppository 0  . methocarbamol (ROBAXIN) 750 MG tablet Take 1 tablet (750 mg total) by mouth every 6 (six) hours  as needed for muscle spasms. (Patient not taking: Reported on 09/09/2017) 14 tablet 0  . morphine (MS CONTIN) 30 MG 12 hr tablet Take 1 tablet (30 mg total) by mouth every 8 (eight) hours as needed. (Patient not taking: Reported on 09/09/2017) 8 tablet 0  . polyethylene glycol (MIRALAX / GLYCOLAX) packet Take 17 g by mouth daily as needed for mild constipation. (Patient not taking: Reported on 09/23/2017) 14 each 0   No current facility-administered medications for this visit.     Allergies:  Allergies  Allergen Reactions  . Furosemide Other (See Comments)    Dehydration leading to kidney failure  "my kidneys shutdown"  . Norvasc [Amlodipine Besylate] Swelling    "Severe edema"  . Prednisone Other (See Comments)    Ketoacidosis  . Vancomycin Other (See Comments)    Total organ shut down "It shut my kidneys down"  . Amlodipine Swelling    SWELLING/EDEMA REACTION UNSPECIFIED   . Clindamycin/Lincomycin Hives and Itching  . Doxycycline Hives and Rash  . Propranolol Cough    Bronchospasms  . Ibuprofen Other (See Comments)    WAS TOLD TO NOT TAKE THIS BECAUSE OF HER KIDNEYS AND LIVER  . Lisinopril Other (See Comments)    WAS TOLD TO NOT TAKE THIS BECAUSE OF HER KIDNEYS AND LIVER  . Lovastatin Other (See Comments)    WAS TOLD TO NOT TAKE THIS BECAUSE OF HER KIDNEYS AND LIVER  . Norvasc [Amlodipine Besylate] Other (See Comments)    Severe edema  . Nsaids Other (See Comments)    WAS TOLD TO NOT TAKE THIS BECAUSE OF HER KIDNEYS AND LIVER  . Sulfa Antibiotics Nausea Only    Past Surgical History:  Procedure Laterality Date  . ABDOMINAL HYSTERECTOMY    . AMPUTATION Bilateral 05/12/2017   Procedure: BILATERAL GREAT TOE AMPUTATION;  Surgeon: Newt Minion, MD;  Location: [facility];  Service: Orthopedics;  Laterality: Bilateral;  . BACK SURGERY    . CESAREAN SECTION    . CHOLECYSTECTOMY    . HERNIA REPAIR    . JOINT REPLACEMENT    . KNEE SURGERY    . SPINE SURGERY    . TOE AMPUTATION    .  TOE AMPUTATION Bilateral    3 on right foot 2 left foot   . TONSILLECTOMY    . TUBAL LIGATION      Social History   Socioeconomic History  . Marital status: Divorced    Spouse name: Not on file  . Number of children: 3  . Years of education: Not on file  .  Highest education level: Not on file  Occupational History  . Not on file  Social Needs  . Financial resource strain: Not on file  . Food insecurity:    Worry: Not on file    Inability: Not on file  . Transportation needs:    Medical: Not on file    Non-medical: Not on file  Tobacco Use  . Smoking status: Never Smoker  . Smokeless tobacco: Never Used  Substance and Sexual Activity  . Alcohol use: No  . Drug use: No  . Sexual activity: Never  Lifestyle  . Physical activity:    Days per week: Not on file    Minutes per session: Not on file  . Stress: Not on file  Relationships  . Social connections:    Talks on phone: Not on file    Gets together: Not on file    Attends religious service: Not on file    Active member of club or organization: Not on file    Attends meetings of clubs or organizations: Not on file    Relationship status: Not on file  Other Topics Concern  . Not on file  Social History Narrative   ** Merged History Encounter **        Family History  Problem Relation Age of Onset  . Cancer Mother        Thymoma, metastatic  . Heart disease Father   . Stroke Father   . Stroke Maternal Grandfather   . Heart disease Paternal Grandfather   . Multiple sclerosis Sister   . Epilepsy Sister   . Diabetes Neg Hx      ROS Review of Systems See HPI Constitution: No fevers or chills No malaise No diaphoresis Skin: No rash or itching Eyes: no blurry vision, no double vision GU: no dysuria or hematuria Neuro: no dizziness or headaches * all others reviewed and negative   Objective: Vitals:   09/27/17 1158  BP: 112/76  Pulse: 78  Resp: 20  Temp: 98.9 F (37.2 C)  TempSrc: Oral  SpO2:  98%  Weight: 236 lb 12.8 oz (107.4 kg)  Height: 5' 5.04" (1.652 m)    Physical Exam  Constitutional: She is oriented to person, place, and time. She appears well-developed and well-nourished.  HENT:  Head: Normocephalic and atraumatic.  Eyes: Conjunctivae and EOM are normal.  Pulmonary/Chest: Effort normal.  Neurological: She is alert and oriented to person, place, and time.  Psychiatric: Her behavior is normal. Judgment and thought content normal.  Tearful affect    Assessment and Plan [name] was seen today for follow-up.  Diagnoses and all orders for this visit:  Medically noncompliant -     Ambulatory referral to Social Work  Frequent falls -     Ambulatory referral to Social Work  Medically complex patient -     Ambulatory referral to Social Work  Screening-pulmonary TB -     TB Skin Test  Completed form for adult services center for day program in [place] Discussed safety and fall risks Discussed family stress and improving communication [name] and Jalia have a very argumentative relationship and requires a lot of refocusing.  TB skin test done so that she can participate in the day program  A total of 40 minutes were spent face-to-face with the patient during this encounter and over half of that time was spent on counseling and coordination of care.    [place]

## 2017-09-27 NOTE — Patient Instructions (Signed)
° ° ° °  If you have lab work done today you will be contacted with your lab results within the next 2 weeks.  If you have not heard from us then please contact us. The fastest way to get your results is to register for My Chart. ° ° °IF you received an x-ray today, you will receive an invoice from [facility]. Please contact [facility] at [phone] with questions or concerns regarding your invoice.  ° °IF you received labwork today, you will receive an invoice from [facility]. Please contact [facility] at [phone] with questions or concerns regarding your invoice.  ° °Our billing staff will not be able to assist you with questions regarding bills from these companies. ° °You will be contacted with the lab results as soon as they are available. The fastest way to get your results is to activate your My Chart account. Instructions are located on the last page of this paperwork. If you have not heard from us regarding the results in 2 weeks, please contact this office. °  ° ° ° °

## 2017-09-30 ENCOUNTER — Ambulatory Visit (INDEPENDENT_AMBULATORY_CARE_PROVIDER_SITE_OTHER): Payer: Medicare HMO | Admitting: Family Medicine

## 2017-09-30 DIAGNOSIS — Z111 Encounter for screening for respiratory tuberculosis: Secondary | ICD-10-CM

## 2017-09-30 LAB — TB SKIN TEST
Induration: 0 mm
TB Skin Test: NEGATIVE

## 2017-09-30 NOTE — Progress Notes (Signed)
Tb read negative

## 2017-10-04 ENCOUNTER — Other Ambulatory Visit: Payer: Self-pay | Admitting: Family Medicine

## 2017-10-05 NOTE — Telephone Encounter (Signed)
Carvedilol 25 mg  refill Last Refill:07/16/17 # 180 Last OV: 09/27/17 PCP: Burnett Harry, MD Pharmacy:[facility] [address], [place], [place] [facility]  12/13/17

## 2017-10-11 ENCOUNTER — Telehealth: Payer: Self-pay | Admitting: Family Medicine

## 2017-10-11 NOTE — Telephone Encounter (Signed)
Copied from [place] [id number]. Topic: Quick Communication - See Telephone Encounter >> Oct 11, 2017 12:40 PM Synthia Innocent wrote: CRM for notification. See Telephone encounter for: 10/11/17. [facility] requesting verbal orders for PT 1x a week for 1 week and   2x a week for 3 weeks. Requesting eval skilled nursing, home health aid for bathing 2x a week for 3 weeks.  and OT eval. Please advise

## 2017-10-13 ENCOUNTER — Ambulatory Visit: Payer: Self-pay | Admitting: *Deleted

## 2017-10-13 NOTE — Telephone Encounter (Signed)
Pt's daughter calling in today after picking pt up from the hospital for vomiting and increased blood glucose. Pt's daughter reports that the pt has been sick in bed this week and states that her blood glucose was over 600 today and the pt was vomiting. Pt's daughter states the pt was taken to the hospital given IV fluids and was given Cephlex to treat a UTI. Pt was taken to [facility] today and was also at the hospital last week as well. Pt's daughter states that the pt had a hospital follow up scheduled for 9/12 with Dr. Nolon Rod but pt's daughter was not aware that the pt had cancelled the appt today via automated system due to having an [facility] on [facility]. Pt's daughter also voices concern of the pt not being able to see pain management doctor and going through withdrawals of Morphine due to appt scheduled today being cancelled because pt was at the hospital and the next available appt not being until Oct. Pt's daughter asking on recommendations on how to help her mother because she states that her mother has not been compliant with her diet or taking her medications and does not know what to do at this point. Pt's daughter requesting for pt to be seen tomorrow, but no appt availability available with Dr. Nolon Rod for a hospital follow until the later part of October. Pt's daughter, [name] can be contacted at [phone].  Reason for Disposition . [1] Follow-up call from patient regarding patient's clinical status AND [2] information NON-URGENT  Answer Assessment - Initial Assessment Questions 1. REASON FOR CALL or QUESTION: "What is your reason for calling today?" or "How can I best help you?" or "What question do you have that I can help answer?"     Pt's daughter calling in today after picking pt up from the hospital for vomiting and increased blood glucose. Pt's daughter reports that the pt has been sick in bed this week and states that her blood glucose was  over 600 today and the pt was vomiting. Pt's daughter states the pt was taken to the hospital given IV fluids and was given Cephlex to treat a UTI. Pt was taken to [facility] today and was also at the hospital last week as well. Pt's daughter states that the pt had a hospital follow up scheduled for 9/12 with Dr. Nolon Rod but pt's daughter was not aware that the pt had cancelled the appt today via automated system due to having an appt at the senior center on tomorrow. Pt's daughter also voices concern of the pt not being able to see pain management doctor and going through withdrawals of Morphine due to appt scheduled today being cancelled because pt was at the hospital and the next available appt not being until Oct. Pt's daughter asking on recommendations on how to help her mother because she states that her mother has not been compliant with her diet or taking her medications and does not know what to do at this point. Pt's daughter requesting for pt to be seen tomorrow, but no appt availability available with Dr. Nolon Rod for a hospital follow until the later part of October. Pt's daughter, [name] can be contacted at [phone]. 2. CALLER: Document the source of call. (e.g., laboratory, patient).     Pt's daughter, [name]  Protocols used: PCP CALL - NO TRIAGE-A-AH

## 2017-10-13 NOTE — Telephone Encounter (Signed)
Please see note below. 

## 2017-10-13 NOTE — Telephone Encounter (Signed)
Verbal order for PT 1x a week for 1 week, 2x a week for 3 weeks, eval skilled nursing , home health aid for bathing 2x a weekf or 3 weeks and OT given per stallings. Verbal given to Nicolette who will given info to Anatasia who is handling Ms. Person's case. Dgaddy, CMA

## 2017-10-14 ENCOUNTER — Inpatient Hospital Stay: Payer: Medicare HMO | Admitting: Family Medicine

## 2017-10-14 NOTE — Progress Notes (Signed)
Chief Complaint  Patient presents with  . Hospitalization Follow-up    per pt in pain all over today    HPI    She passed out 10/13/17 She reports that she was outside of a nail salon and she took her insulin and ate She was having varying levels of consciousness She had fatigue She was taken by paramedics to the ER and was seen in the ER for vomiting, and UTI She was treated with the UTI She has been weaning down off the MS continue and has been decreasing her dose  Wt Readings from Last 3 Encounters:  10/15/17 227 lb (103 kg)  09/27/17 236 lb 12.8 oz (107.4 kg)  09/23/17 236 lb 9.6 oz (107.3 kg)   Creatinine 1.8 WBC 4.9  She is fasting currently due to a lack of appetite Her fasting glucose was 400 She had a steak torta for dinner last night She reports that it had rice in the torta with mayonnaise  Past Medical History:  Diagnosis Date  . Allergy   . Arthritis   . Chronic kidney disease   . Chronic pain   . Coronary artery disease   . Diabetes mellitus without complication ([place])   . Dyspnea   . GERD (gastroesophageal reflux disease)   . Heart murmur   . Hypertension   . Liver disease   . NASH (nonalcoholic steatohepatitis) 09/16/2016  . Obstructive sleep apnea   . Osteomyelitis ([place])   . Peripheral vascular disease ([place])   . Sleep apnea   . Stroke ([place])   . Ulcers of both great toes ([place]) 11/07/2016   right great toe, started as finger nail size and grew in 6 days     Current Outpatient Medications  Medication Sig Dispense Refill  . atorvastatin (LIPITOR) 80 MG tablet Take 1 tablet (80 mg total) by mouth daily at 6 PM. 90 tablet 3  . busPIRone (BUSPAR) 15 MG tablet Take 1 tablet (15 mg total) by mouth 3 (three) times daily. 270 tablet 3  . carvedilol (COREG) 25 MG tablet TAKE 1 TABLET BY MOUTH TWICE DAILY 180 tablet 0  . clopidogrel (PLAVIX) 75 MG tablet Take 1 tablet (75 mg total) by mouth daily. 90 tablet 3  . diclofenac sodium (VOLTAREN) 1 % GEL Apply  2 g topically as needed (muscle spasms).    . docusate sodium (COLACE) 100 MG capsule Take 1 capsule (100 mg total) by mouth 2 (two) times daily. 10 capsule 0  . exenatide (BYETTA 5 MCG PEN) 5 MCG/0.02ML SOPN injection Inject 0.02 milliliters (67mg total) subcutaneously twice a day with meals 3.6 mL 0  . ezetimibe (ZETIA) 10 MG tablet Take 1 tablet (10 mg total) by mouth daily. 90 tablet 3  . gabapentin (NEURONTIN) 300 MG capsule Take 1 capsule (300 mg total) by mouth 2 (two) times daily.    . insulin aspart (NOVOLOG) 100 UNIT/ML FlexPen Inject 10 Units into the skin 3 (three) times daily before meals. Take only if blood sugar is over 300 15 mL 3  . Insulin NPH, Human,, Isophane, (HUMULIN N KWIKPEN) 100 UNIT/ML Kiwkpen Inject 220 Units into the skin daily before lunch. And pen needles 4/day.. 30 pen 11  . Insulin Pen Needle (BD ULTRA-FINE PEN NEEDLES) 29G X 12.7MM MISC USE AS DIRECTED 200 each 0  . INSULIN SYRINGE 1CC/29G (B-D INS SYR ULTRAFINE 1CC/29G) 29G X 1/2" 1 ML MISC Used to give insulin injections 4x daily. 200 each 11  . morphine (MSIR) 15 MG  tablet Take 1 tablet (15 mg total) by mouth every 8 (eight) hours as needed for moderate pain. 10 tablet 0  . ranolazine (RANEXA) 1000 MG SR tablet Take 1 tablet (1,000 mg total) by mouth 2 (two) times daily. 180 tablet 3  . sertraline (ZOLOFT) 50 MG tablet Take 1 tablet (50 mg total) by mouth daily. 90 tablet 3  . tiZANidine (ZANAFLEX) 4 MG capsule Take 4 mg by mouth 3 (three) times daily.    . bisacodyl (DULCOLAX) 10 MG suppository Place 1 suppository (10 mg total) rectally daily as needed for moderate constipation. (Patient not taking: Reported on 09/23/2017) 12 suppository 0  . methocarbamol (ROBAXIN) 750 MG tablet Take 1 tablet (750 mg total) by mouth every 6 (six) hours as needed for muscle spasms. (Patient not taking: Reported on 09/09/2017) 14 tablet 0  . morphine (MS CONTIN) 30 MG 12 hr tablet Take 1 tablet (30 mg total) by mouth every 8 (eight)  hours as needed. (Patient not taking: Reported on 09/09/2017) 8 tablet 0  . polyethylene glycol (MIRALAX / GLYCOLAX) packet Take 17 g by mouth daily as needed for mild constipation. (Patient not taking: Reported on 09/23/2017) 14 each 0   No current facility-administered medications for this visit.     Allergies:  Allergies  Allergen Reactions  . Furosemide Other (See Comments)    Dehydration leading to kidney failure  "my kidneys shutdown"  . Norvasc [Amlodipine Besylate] Swelling    "Severe edema"  . Prednisone Other (See Comments)    Ketoacidosis  . Vancomycin Other (See Comments)    Total organ shut down "It shut my kidneys down"  . Amlodipine Swelling    SWELLING/EDEMA REACTION UNSPECIFIED   . Clindamycin/Lincomycin Hives and Itching  . Doxycycline Hives and Rash  . Propranolol Cough    Bronchospasms  . Ibuprofen Other (See Comments)    WAS TOLD TO NOT TAKE THIS BECAUSE OF HER KIDNEYS AND LIVER  . Lisinopril Other (See Comments)    WAS TOLD TO NOT TAKE THIS BECAUSE OF HER KIDNEYS AND LIVER  . Lovastatin Other (See Comments)    WAS TOLD TO NOT TAKE THIS BECAUSE OF HER KIDNEYS AND LIVER  . Norvasc [Amlodipine Besylate] Other (See Comments)    Severe edema  . Nsaids Other (See Comments)    WAS TOLD TO NOT TAKE THIS BECAUSE OF HER KIDNEYS AND LIVER  . Sulfa Antibiotics Nausea Only    Past Surgical History:  Procedure Laterality Date  . ABDOMINAL HYSTERECTOMY    . AMPUTATION Bilateral 05/12/2017   Procedure: BILATERAL GREAT TOE AMPUTATION;  Surgeon: Newt Minion, MD;  Location: [place];  Service: Orthopedics;  Laterality: Bilateral;  . BACK SURGERY    . CESAREAN SECTION    . CHOLECYSTECTOMY    . HERNIA REPAIR    . JOINT REPLACEMENT    . KNEE SURGERY    . SPINE SURGERY    . TOE AMPUTATION    . TOE AMPUTATION Bilateral    3 on right foot 2 left foot   . TONSILLECTOMY    . TUBAL LIGATION      Social History   Socioeconomic History  . Marital status: Divorced     Spouse name: Not on file  . Number of children: 3  . Years of education: Not on file  . Highest education level: Not on file  Occupational History  . Not on file  Social Needs  . Financial resource strain: Not on file  . Food  insecurity:    Worry: Not on file    Inability: Not on file  . Transportation needs:    Medical: Not on file    Non-medical: Not on file  Tobacco Use  . Smoking status: Never Smoker  . Smokeless tobacco: Never Used  Substance and Sexual Activity  . Alcohol use: No  . Drug use: No  . Sexual activity: Never  Lifestyle  . Physical activity:    Days per week: Not on file    Minutes per session: Not on file  . Stress: Not on file  Relationships  . Social connections:    Talks on phone: Not on file    Gets together: Not on file    Attends religious service: Not on file    Active member of club or organization: Not on file    Attends meetings of clubs or organizations: Not on file    Relationship status: Not on file  Other Topics Concern  . Not on file  Social History Narrative   ** Merged History Encounter **        Family History  Problem Relation Age of Onset  . Cancer Mother        Thymoma, metastatic  . Heart disease Father   . Stroke Father   . Stroke Maternal Grandfather   . Heart disease Paternal Grandfather   . Multiple sclerosis Sister   . Epilepsy Sister   . Diabetes Neg Hx      ROS Review of Systems See HPI Constitution: No fevers or chills No malaise No diaphoresis Skin: No rash or itching Eyes: no blurry vision, no double vision GU: no dysuria or hematuria, see hpi Neuro: see hpi all others reviewed and negative   Objective: Vitals:   10/15/17 0903  BP: (!) 145/75  Pulse: 85  Resp: 16  Temp: 97.7 F (36.5 C)  TempSrc: Oral  SpO2: 97%  Weight: 227 lb (103 kg)  Height: 5' 5.04" (1.652 m)    Physical Exam  Constitutional: She is oriented to person, place, and time. She appears well-developed and  well-nourished.  HENT:  Head: Normocephalic and atraumatic.  Eyes: Conjunctivae and EOM are normal.  Cardiovascular: Normal rate, regular rhythm and normal heart sounds.  Pulmonary/Chest: Effort normal and breath sounds normal. No stridor. No respiratory distress. She has no wheezes.  Neurological: She is alert and oriented to person, place, and time.  Psychiatric: She has a normal mood and affect. Her behavior is normal. Judgment and thought content normal.    Assessment and Plan Jenaveve was seen today for hospitalization follow-up.  Diagnoses and all orders for this visit:    Chronic prescription opiate use - pt is trying to self wean off morphine She was advised that with her kidney disease and diabetes the risk of opiates causing altered mental status is great  Postural dizziness with presyncope 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 ([place])- discussed that her labile sugars and her opiate use and kidney failure is contributing to her spells of dizziness I am very concerned about her being on opiates She has an appt at 11am -     POCT glucose (manual entry)  Hyperglycemia -     POCT glucose (manual entry)  Acute UTI- pt has not started her antibiotic yet but was given a dose in the ER -     POCT Microscopic Urinalysis ([facility]) -     POCT urinalysis dipstick     Tor Tsuda A Nolon Rod

## 2017-10-15 ENCOUNTER — Encounter: Payer: Self-pay | Admitting: Family Medicine

## 2017-10-15 ENCOUNTER — Other Ambulatory Visit: Payer: Self-pay

## 2017-10-15 ENCOUNTER — Ambulatory Visit (INDEPENDENT_AMBULATORY_CARE_PROVIDER_SITE_OTHER): Payer: Medicare HMO | Admitting: Family Medicine

## 2017-10-15 VITALS — BP 145/75 | HR 85 | Temp 97.7°F | Resp 16 | Ht 65.04 in | Wt 227.0 lb

## 2017-10-15 DIAGNOSIS — R42 Dizziness and giddiness: Secondary | ICD-10-CM

## 2017-10-15 DIAGNOSIS — N390 Urinary tract infection, site not specified: Secondary | ICD-10-CM

## 2017-10-15 DIAGNOSIS — N183 Chronic kidney disease, stage 3 (moderate): Secondary | ICD-10-CM | POA: Diagnosis not present

## 2017-10-15 DIAGNOSIS — I129 Hypertensive chronic kidney disease with stage 1 through stage 4 chronic kidney disease, or unspecified chronic kidney disease: Secondary | ICD-10-CM | POA: Diagnosis not present

## 2017-10-15 DIAGNOSIS — R55 Syncope and collapse: Secondary | ICD-10-CM

## 2017-10-15 DIAGNOSIS — E1322 Other specified diabetes mellitus with diabetic chronic kidney disease: Secondary | ICD-10-CM

## 2017-10-15 DIAGNOSIS — R739 Hyperglycemia, unspecified: Secondary | ICD-10-CM

## 2017-10-15 DIAGNOSIS — Z79891 Long term (current) use of opiate analgesic: Secondary | ICD-10-CM

## 2017-10-15 LAB — POCT URINALYSIS DIP (MANUAL ENTRY)
Bilirubin, UA: NEGATIVE
Glucose, UA: 500 mg/dL — AB
Ketones, POC UA: NEGATIVE mg/dL
LEUKOCYTES UA: NEGATIVE
NITRITE UA: NEGATIVE
Protein Ur, POC: 100 mg/dL — AB
Spec Grav, UA: 1.02 (ref 1.010–1.025)
UROBILINOGEN UA: 0.2 U/dL
pH, UA: 5.5 (ref 5.0–8.0)

## 2017-10-15 LAB — POC MICROSCOPIC URINALYSIS (UMFC): Mucus: ABSENT

## 2017-10-15 LAB — GLUCOSE, POCT (MANUAL RESULT ENTRY): POC GLUCOSE: 348 mg/dL — AB (ref 70–99)

## 2017-10-15 NOTE — Patient Instructions (Addendum)
Discuss with Pain Clinic that you should wean off the Opiates because of the risk of falling and the difficulty with confusion.     If you have lab work done today you will be contacted with your lab results within the next 2 weeks.  If you have not heard from [place] then please contact us. The fastest way to get your results is to register for My Chart.   IF you received an x-ray today, you will receive an invoice from [facility]. Please contact [facility] at [phone] with questions or concerns regarding your invoice.   IF you received labwork today, you will receive an invoice from [place]. Please contact [facility] at [phone] with questions or concerns regarding your invoice.   Our billing staff will not be able to assist you with questions regarding bills from these companies.  You will be contacted with the lab results as soon as they are available. The fastest way to get your results is to activate your My Chart account. Instructions are located on the last page of this paperwork. If you have not heard from [place] regarding the results in 2 weeks, please contact this office.

## 2017-10-18 ENCOUNTER — Telehealth: Payer: Self-pay | Admitting: Family Medicine

## 2017-10-18 NOTE — Telephone Encounter (Signed)
Pt seen on 10/15/17 issues will be addressed at ov. Dgaddy, CMA

## 2017-10-18 NOTE — Telephone Encounter (Signed)
Copied from [place] [id number]. Topic: Quick Communication - See Telephone Encounter >> Oct 11, 2017 12:40 PM Synthia Innocent wrote: CRM for notification. See Telephone encounter for: 10/11/17. [facility] requesting verbal orders for PT 1x a week for 1 week and  2x a week for 3 weeks. Requesting eval skilled nursing, home health aid for bathing 2x a week for 3 weeks.  and OT eval. Please advise >> Oct 18, 2017 12:04 PM Ivar Drape wrote: Rosann Auerbach OT w/[facility] [phone] is requesting orders for OT 2 times a week for 2 weeks, and one time a week for two weeks.

## 2017-10-19 NOTE — Telephone Encounter (Signed)
Please confirm verbal orders

## 2017-10-20 ENCOUNTER — Telehealth: Payer: Self-pay | Admitting: Family Medicine

## 2017-10-20 NOTE — Telephone Encounter (Signed)
Left message on [name]'s VM for her to call me on my mobile number

## 2017-10-20 NOTE — Telephone Encounter (Signed)
Spoke with Rosann Auerbach and verbal orders given for PT x 1 week for 1 week and 2x a week for 3 weeks, with eval for skilled nursing , home health aid for bathing 2x a week for 3 weeks.  And OT eval approved per [facility]. Jana Half reports pt blood sugars hight and she thinks it may be due to foods pt is eating. Dgaddy, CMA

## 2017-10-20 NOTE — Telephone Encounter (Signed)
Agree with this plan. Please give verbal order.

## 2017-10-20 NOTE — Telephone Encounter (Signed)
Copied from [place] [phone] [phone]. Topic: General - Other >> Oct 20, 2017  9:40 AM Leward Quan A wrote: Reason for CRM:  Cecille Rubin with [facility] called to speak with Dr. Nolon Rod or her nurse with concerns for the patients blood sugars. Stated that blood sugars are around 400 to 600 so they will be working with someone to help regulate the blood sugar. Request a call back today ASAP.

## 2017-10-20 NOTE — Telephone Encounter (Signed)
Please call marsha at [phone]. Rosann Auerbach is going out today to see the patient

## 2017-10-25 ENCOUNTER — Ambulatory Visit: Payer: Medicare HMO | Admitting: Emergency Medicine

## 2017-11-02 ENCOUNTER — Ambulatory Visit: Payer: Medicare HMO | Admitting: Emergency Medicine

## 2017-11-04 ENCOUNTER — Other Ambulatory Visit: Payer: Self-pay

## 2017-11-04 ENCOUNTER — Encounter: Payer: Self-pay | Admitting: Family Medicine

## 2017-11-04 ENCOUNTER — Ambulatory Visit (INDEPENDENT_AMBULATORY_CARE_PROVIDER_SITE_OTHER): Payer: Medicare HMO | Admitting: Family Medicine

## 2017-11-04 VITALS — BP 78/52 | HR 86 | Temp 97.7°F | Resp 16 | Ht 65.04 in | Wt 243.4 lb

## 2017-11-04 DIAGNOSIS — E1322 Other specified diabetes mellitus with diabetic chronic kidney disease: Secondary | ICD-10-CM | POA: Diagnosis not present

## 2017-11-04 DIAGNOSIS — I10 Essential (primary) hypertension: Secondary | ICD-10-CM | POA: Diagnosis not present

## 2017-11-04 DIAGNOSIS — I9589 Other hypotension: Secondary | ICD-10-CM | POA: Diagnosis not present

## 2017-11-04 DIAGNOSIS — N183 Chronic kidney disease, stage 3 unspecified: Secondary | ICD-10-CM

## 2017-11-04 DIAGNOSIS — I129 Hypertensive chronic kidney disease with stage 1 through stage 4 chronic kidney disease, or unspecified chronic kidney disease: Secondary | ICD-10-CM

## 2017-11-04 DIAGNOSIS — Z789 Other specified health status: Secondary | ICD-10-CM | POA: Diagnosis not present

## 2017-11-04 MED ORDER — INSULIN PEN NEEDLE 29G X 12.7MM MISC: 11 refills | Status: DC

## 2017-11-04 NOTE — Patient Instructions (Addendum)
Decrease your Carvedilol to 12.29m twice a day Your blood pressure is TOO LOW You cannot feel it due to the medications Pick up a blood pressure monitor and take your blood pressure at different times during the day for a week straight   Call Dr. ELoanne Drillingfor a follow up appointment for your diabetes I am checking your a1c today  ERenato Shin MD Consulting Physician Endocrinology 11/04/2017 End  11/04/17  Phone: [phone] Fax: [phone]        If you have lab work done today you will be contacted with your lab results within the next 2 weeks.  If you have not heard from [place] please contact [place] The fastest way to get your results is to register for My Chart.   IF you received an x-ray today, you will [facility] an [facility]. Please contact [facility] at [phone] questions or concerns regarding your invoice.   IF you received labwork today, you will receive an invoice from [place] Please contact [facility] at [phone] questions or concerns regarding your invoice.   Our billing staff will not be able to assist you with questions regarding bills from these companies.  You will be contacted with the lab results as soon as they are available. The fastest way to get your results is to activate your My Chart account. Instructions are located on the last page of this paperwork. If you have not heard from [place] the results in 2 weeks, please contact this office.     How to Take Your Blood Pressure Blood pressure is a measurement of how strongly your blood is pressing against the walls of your arteries. Arteries are blood vessels that carry blood from your heart throughout your body. Your health care provider takes your blood pressure at each office visit. You can also take your own blood pressure at home with a blood pressure machine. You may need to take your own blood pressure:  To confirm a diagnosis of high blood pressure  (hypertension).  To monitor your blood pressure over time.  To make sure your blood pressure medicine is working.  Supplies needed: To take your blood pressure, you will need a blood pressure machine. You can buy a blood pressure machine, or blood pressure monitor, at most drugstores or online. There are several types of home blood pressure monitors. When choosing one, consider the following:  Choose a monitor that has an arm cuff.  Choose a monitor that wraps snugly around your upper arm. You should be able to fit only one finger between your arm and the cuff.  Do not choose a monitor that measures your blood pressure from your wrist or finger.  Your health care provider can suggest a reliable monitor that will meet your needs. How to prepare To get the most accurate reading, avoid the following for 30 minutes before you check your blood pressure:  Drinking caffeine.  Drinking alcohol.  Eating.  Smoking.  Exercising.  Five minutes before you check your blood pressure:  Empty your bladder.  Sit quietly without talking in a dining chair, rather than in a soft couch or armchair.  How to take your blood pressure To check your blood pressure, follow the instructions in the manual that came with your blood pressure monitor. If you have a digital blood pressure monitor, the instructions may be as follows: 1. Sit up straight. 2. Place your feet on the floor. Do not cross your ankles or legs. 3.  Rest your left arm at the level of your heart on a table or desk or on the arm of a chair. 4. Pull up your shirt sleeve. 5. Wrap the blood pressure cuff around the upper part of your left arm, 1 inch (2.5 cm) above your elbow. It is best to wrap the cuff around bare skin. 6. Fit the cuff snugly around your arm. You should be able to place only one finger between the cuff and your arm. 7. Position the cord inside the groove of your elbow. 8. Press the power button. 9. Sit quietly while the  cuff inflates and deflates. 10. Read the digital reading on the monitor screen and write it down (record it). 11. Wait 2-3 minutes, then repeat the steps, starting at step 1.  What does my blood pressure reading mean? A blood pressure reading consists of a higher number over a lower number. Ideally, your blood pressure should be below 120/80. The first ("top") number is called the systolic pressure. It is a measure of the pressure in your arteries as your heart beats. The second ("bottom") number is called the diastolic pressure. It is a measure of the pressure in your arteries as the heart relaxes. Blood pressure is classified into four stages. The following are the stages for adults who do not have a short-term serious illness or a chronic condition. Systolic pressure and diastolic pressure are measured in a unit called mm Hg. Normal  Systolic pressure: below 195.  Diastolic pressure: below 80. Elevated  Systolic pressure: 093-267.  Diastolic pressure: below 80. Hypertension stage 1  Systolic pressure: 124-580.  Diastolic pressure: 99-83. Hypertension stage 2  Systolic pressure: 382 or above.  Diastolic pressure: 90 or above. You can have prehypertension or hypertension even if only the systolic or only the diastolic number in your reading is higher than normal. Follow these instructions at home:  Check your blood pressure as often as recommended by your health care provider.  Take your monitor to the next appointment with your health care provider to make sure: ? That you are using it correctly. ? That it provides accurate readings.  Be sure you understand what your goal blood pressure numbers are.  Tell your health care provider if you are having any side effects from blood pressure medicine. Contact a health care provider if:  Your blood pressure is consistently high. Get help right away if:  Your systolic blood pressure is higher than 180.  Your diastolic blood  pressure is higher than 110. This information is not intended to replace advice given to you by your health care provider. Make sure you discuss any questions you have with your health care provider. Document Released: 06/28/2015 Document Revised: 09/10/2015 Document Reviewed: 06/28/2015 Elsevier Interactive Patient Education  [employer].

## 2017-11-04 NOTE — Progress Notes (Signed)
Chief Complaint  Patient presents with  . Diabetes    follow up  . Medication Refill    insulin pen needles    HPI   Uncontrolled diabetes with stage 3 Kidney disease Pt reports that she has been doing a food log and states that she had a garlic burger and fries from [employer] This morning her fasting sugar was 295 She states that she had a sausage burrito for breakfast She states that she normally has a bowl of cereal She states    Hypertension: Patient here for follow-up of elevated blood pressure. She does not exercise, she uses her wheelers, and is not watching out for her salt intake. Blood pressure is not checked at home. Cardiac symptoms fatigue. Patient denies chest pain, chest pressure/discomfort, claudication, dyspnea and exertional chest pressure/discomfort.  Cardiovascular risk factors: diabetes mellitus, dyslipidemia, hypertension and obesity (BMI >= 30 kg/m2). Use of agents associated with hypertension: none. History of target organ damage: none.  She took her blood pressure med this morning!  BP Readings from Last 3 Encounters:  11/04/17 (!) 78/52  10/15/17 (!) 145/75  09/27/17 112/76     Past Medical History:  Diagnosis Date  . Allergy   . Arthritis   . Chronic kidney disease   . Chronic pain   . Coronary artery disease   . Diabetes mellitus without complication ([place])   . Dyspnea   . GERD (gastroesophageal reflux disease)   . Heart murmur   . Hypertension   . Liver disease   . NASH (nonalcoholic steatohepatitis) 09/16/2016  . Obstructive sleep apnea   . Osteomyelitis ([place])   . Peripheral vascular disease ([place])   . Sleep apnea   . Stroke ([place])   . Ulcers of both great toes ([place]) 11/07/2016   right great toe, started as finger nail size and grew in 6 days     Current Outpatient Medications  Medication Sig Dispense Refill  . atorvastatin (LIPITOR) 80 MG tablet Take 1 tablet (80 mg total) by mouth daily at 6 PM. 90 tablet 3  . busPIRone (BUSPAR) 15  MG tablet Take 1 tablet (15 mg total) by mouth 3 (three) times daily. 270 tablet 3  . carvedilol (COREG) 25 MG tablet TAKE 1 TABLET BY MOUTH TWICE DAILY 180 tablet 0  . clopidogrel (PLAVIX) 75 MG tablet Take 1 tablet (75 mg total) by mouth daily. 90 tablet 3  . diclofenac sodium (VOLTAREN) 1 % GEL Apply 2 g topically as needed (muscle spasms).    . docusate sodium (COLACE) 100 MG capsule Take 1 capsule (100 mg total) by mouth 2 (two) times daily. 10 capsule 0  . exenatide (BYETTA 5 MCG PEN) 5 MCG/0.02ML SOPN injection Inject 0.02 milliliters (75mg total) subcutaneously twice a day with meals 3.6 mL 0  . ezetimibe (ZETIA) 10 MG tablet Take 1 tablet (10 mg total) by mouth daily. 90 tablet 3  . gabapentin (NEURONTIN) 300 MG capsule Take 1 capsule (300 mg total) by mouth 2 (two) times daily.    .Marland KitchenHYDROmorphone (DILAUDID) 8 MG tablet Take 8 mg by mouth 4 (four) times daily.  0  . insulin aspart (NOVOLOG) 100 UNIT/ML FlexPen Inject 10 Units into the skin 3 (three) times daily before meals. Take only if blood sugar is over 300 15 mL 3  . Insulin NPH, Human,, Isophane, (HUMULIN N KWIKPEN) 100 UNIT/ML Kiwkpen Inject 220 Units into the skin daily before lunch. And pen needles 4/day.. 30 pen 11  . Insulin Pen Needle (  BD ULTRA-FINE PEN NEEDLES) 29G X 12.7MM MISC USE AS DIRECTED 200 each 11  . INSULIN SYRINGE 1CC/29G (B-D INS SYR ULTRAFINE 1CC/29G) 29G X 1/2" 1 ML MISC Used to give insulin injections 4x daily. 200 each 11  . polyethylene glycol (MIRALAX / GLYCOLAX) packet Take 17 g by mouth daily as needed for mild constipation. (Patient not taking: Reported on 09/23/2017) 14 each 0  . ranolazine (RANEXA) 1000 MG SR tablet Take 1 tablet (1,000 mg total) by mouth 2 (two) times daily. 180 tablet 3  . sertraline (ZOLOFT) 50 MG tablet Take 1 tablet (50 mg total) by mouth daily. 90 tablet 3  . tiZANidine (ZANAFLEX) 4 MG capsule Take 4 mg by mouth 3 (three) times daily.     No current facility-administered medications  for this visit.     Allergies:  Allergies  Allergen Reactions  . Furosemide Other (See Comments)    Dehydration leading to kidney failure  "my kidneys shutdown"  . Norvasc [Amlodipine Besylate] Swelling    "Severe edema"  . Prednisone Other (See Comments)    Ketoacidosis  . Vancomycin Other (See Comments)    Total organ shut down "It shut my kidneys down"  . Amlodipine Swelling    SWELLING/EDEMA REACTION UNSPECIFIED   . Clindamycin/Lincomycin Hives and Itching  . Doxycycline Hives and Rash  . Propranolol Cough    Bronchospasms  . Ibuprofen Other (See Comments)    WAS TOLD TO NOT TAKE THIS BECAUSE OF HER KIDNEYS AND LIVER  . Lisinopril Other (See Comments)    WAS TOLD TO NOT TAKE THIS BECAUSE OF HER KIDNEYS AND LIVER  . Lovastatin Other (See Comments)    WAS TOLD TO NOT TAKE THIS BECAUSE OF HER KIDNEYS AND LIVER  . Norvasc [Amlodipine Besylate] Other (See Comments)    Severe edema  . Nsaids Other (See Comments)    WAS TOLD TO NOT TAKE THIS BECAUSE OF HER KIDNEYS AND LIVER  . Sulfa Antibiotics Nausea Only    Past Surgical History:  Procedure Laterality Date  . ABDOMINAL HYSTERECTOMY    . AMPUTATION Bilateral 05/12/2017   Procedure: BILATERAL GREAT TOE AMPUTATION;  Surgeon: Newt Minion, MD;  Location: [facility];  Service: Orthopedics;  Laterality: Bilateral;  . BACK SURGERY    . CESAREAN SECTION    . CHOLECYSTECTOMY    . HERNIA REPAIR    . JOINT REPLACEMENT    . KNEE SURGERY    . SPINE SURGERY    . TOE AMPUTATION    . TOE AMPUTATION Bilateral    3 on right foot 2 left foot   . TONSILLECTOMY    . TUBAL LIGATION      Social History   Socioeconomic History  . Marital status: Divorced    Spouse name: Not on file  . Number of children: 3  . Years of education: Not on file  . Highest education level: Not on file  Occupational History  . Not on file  Social Needs  . Financial resource strain: Not on file  . Food insecurity:    Worry: Not on file    Inability:  Not on file  . Transportation needs:    Medical: Not on file    Non-medical: Not on file  Tobacco Use  . Smoking status: Never Smoker  . Smokeless tobacco: Never Used  Substance and Sexual Activity  . Alcohol use: No  . Drug use: No  . Sexual activity: Never  Lifestyle  . Physical activity:  Days per week: Not on file    Minutes per session: Not on file  . Stress: Not on file  Relationships  . Social connections:    Talks on phone: Not on file    Gets together: Not on file    Attends religious service: Not on file    Active member of club or organization: Not on file    Attends meetings of clubs or organizations: Not on file    Relationship status: Not on file  Other Topics Concern  . Not on file  Social History Narrative   ** Merged History Encounter **        Family History  Problem Relation Age of Onset  . Cancer Mother        Thymoma, metastatic  . Heart disease Father   . Stroke Father   . Stroke Maternal Grandfather   . Heart disease Paternal Grandfather   . Multiple sclerosis Sister   . Epilepsy Sister   . Diabetes Neg Hx      ROS Review of Systems See HPI Constitution: No fevers or chills No malaise No diaphoresis Skin: No rash or itching Eyes: no blurry vision, no double vision GU: no dysuria or hematuria Neuro: no dizziness or headaches all others reviewed and negative   Objective: Vitals:   11/04/17 0948  BP: (!) 78/52  Pulse: 86  Resp: 16  Temp: 97.7 F (36.5 C)  TempSrc: Oral  SpO2: 94%  Weight: 243 lb 6.4 oz (110.4 kg)  Height: 5' 5.04" (1.652 m)    Physical Exam Physical Exam  Constitutional: She is oriented to person, place, and time. She appears well-developed and well-nourished.  HENT:  Head: Normocephalic and atraumatic.  Eyes: Conjunctivae and EOM are normal.  Cardiovascular: Normal rate, regular rhythm and normal heart sounds.   Pulmonary/Chest: Effort normal and breath sounds normal. No respiratory distress. She has  no wheezes.  Neurological: She is alert and oriented to person, place, and time.    Assessment and Plan [name] was seen today for diabetes and medication refill.  Diagnoses and all orders for this visit:  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 ([place])-  She declined flu shot -     Hemoglobin A1c -     Comprehensive metabolic panel Pt advised to follow up with  Dr. Loanne Drilling for diabetes checking a1c today   Medically complex patient- pt still has poor insight into the expected diet and lifestyle changes for diabetes  Essential hypertension  -     Comprehensive metabolic panel  Iatrogenic hypotension- bp low today, currently taking Carvedilol 72m bid Decrease your Carvedilol to 12.547mtwice a day Your blood pressure is TOO LOW You cannot feel it due to the medications Pick up a blood pressure monitor and take your blood pressure at different times during the day for a week straight   Other orders -     Insulin Pen Needle (BD ULTRA-FINE PEN NEEDLES) 29G X 12.7MM MISC; USE AS DIRECTED  A total of 25 minutes were spent face-to-face with the patient during this encounter and over half of that time was spent on counseling and coordination of care.  -  Reviewing diabetes and diet -  Choices for diabetics from fast food places -  Discussed compliance -  Discussed her bp meds overcorrecting her bp -  Reviewed notes for Endocrinology and Cardiology  [facility]

## 2017-11-05 LAB — COMPREHENSIVE METABOLIC PANEL
ALK PHOS: 94 IU/L (ref 39–117)
ALT: 22 IU/L (ref 0–32)
AST: 21 IU/L (ref 0–40)
Albumin/Globulin Ratio: 1.5 (ref 1.2–2.2)
Albumin: 3.5 g/dL — ABNORMAL LOW (ref 3.6–4.8)
BILIRUBIN TOTAL: 0.4 mg/dL (ref 0.0–1.2)
BUN/Creatinine Ratio: 13 (ref 12–28)
BUN: 21 mg/dL (ref 8–27)
CALCIUM: 8.7 mg/dL (ref 8.7–10.3)
CHLORIDE: 104 mmol/L (ref 96–106)
CO2: 25 mmol/L (ref 20–29)
Creatinine, Ser: 1.64 mg/dL — ABNORMAL HIGH (ref 0.57–1.00)
GFR calc Af Amer: 38 mL/min/{1.73_m2} — ABNORMAL LOW (ref >59)
GFR calc non Af Amer: 33 mL/min/{1.73_m2} — ABNORMAL LOW (ref >59)
Globulin, Total: 2.4 g/dL (ref 1.5–4.5)
Glucose: 86 mg/dL (ref 65–99)
POTASSIUM: 4.2 mmol/L (ref 3.5–5.2)
SODIUM: 141 mmol/L (ref 134–144)
Total Protein: 5.9 g/dL — ABNORMAL LOW (ref 6.0–8.5)

## 2017-11-05 LAB — HEMOGLOBIN A1C
Est. average glucose Bld gHb Est-mCnc: 298 mg/dL
HEMOGLOBIN A1C: 12 % — AB (ref 4.8–5.6)

## 2017-11-10 ENCOUNTER — Telehealth: Payer: Self-pay | Admitting: Family Medicine

## 2017-11-10 NOTE — Telephone Encounter (Signed)
Dr Nolon Rod is ok to give this verbal order for PT?

## 2017-11-10 NOTE — Telephone Encounter (Signed)
Copied from [place] [id number]. Topic: Quick Communication - See Telephone Encounter >> Nov 10, 2017  9:02 AM Adelene Idler wrote: Farrell Ours from [facility] is calling in for ongoing physical therapy orders for 1x week for 1 week , 2 x week for 3 weeks starting this week to work on strength and balance for safe transfers  Cb# [phone]

## 2017-11-11 ENCOUNTER — Ambulatory Visit: Payer: Medicare HMO | Admitting: Endocrinology

## 2017-11-11 DIAGNOSIS — Z0289 Encounter for other administrative examinations: Secondary | ICD-10-CM

## 2017-11-12 ENCOUNTER — Ambulatory Visit: Payer: Medicare HMO | Admitting: Endocrinology

## 2017-11-15 NOTE — Telephone Encounter (Signed)
Blessie from [facility], called in wanting to follow up on the patient regarding the verbal orders. Says she would like to have them by today if possible.    She also says she would like to work with the patient 2x's a week for 3 weeks and to disregard the 1 x a week for 1 week.    Please advise  [phone]

## 2017-11-16 ENCOUNTER — Telehealth: Payer: Self-pay | Admitting: Family Medicine

## 2017-11-16 NOTE — Telephone Encounter (Signed)
Copied from [place] [phone]. Topic: Quick Communication - Home Health Verbal Orders >> Nov 16, 2017  4:47 PM Adelene Idler wrote: Caller/Agency: [facility] Number: [phone] Requesting OT/PT/Skilled Nursing/Social Work: PT Frequency: 2X week 3 weeks

## 2017-11-16 NOTE — Telephone Encounter (Signed)
Called and spoke with [facility] nurse and gave verbal order for PT, she verbalized understanding.

## 2017-11-19 ENCOUNTER — Telehealth: Payer: Self-pay | Admitting: Family Medicine

## 2017-11-19 NOTE — Telephone Encounter (Signed)
Copied from [place] [id number]. Topic: Quick Communication - See Telephone Encounter >> Nov 19, 2017  3:17 PM Vernona Rieger wrote: CRM for notification. See Telephone encounter for: 11/19/17.  Dorian Pod, RN with [facility] called to say she is discharging in two weeks. She said that she is non compliment with them, her blood sugars are running over 400. She has been doing a food log and it is very bad. This is just an [place].

## 2017-11-19 NOTE — Telephone Encounter (Signed)
Noted  

## 2017-11-24 NOTE — Telephone Encounter (Signed)
Verbal was given last week per [facility] and will be finishing up with pt this week.  Appreciative of c/b. Dgaddy, CMA

## 2017-11-24 NOTE — Telephone Encounter (Signed)
Please give the verbal order.

## 2017-12-06 ENCOUNTER — Encounter: Payer: Self-pay | Admitting: Endocrinology

## 2017-12-06 ENCOUNTER — Ambulatory Visit (INDEPENDENT_AMBULATORY_CARE_PROVIDER_SITE_OTHER): Payer: Medicare HMO | Admitting: Endocrinology

## 2017-12-06 VITALS — BP 142/64 | HR 81 | Ht 65.0 in | Wt 248.6 lb

## 2017-12-06 DIAGNOSIS — E1122 Type 2 diabetes mellitus with diabetic chronic kidney disease: Secondary | ICD-10-CM

## 2017-12-06 DIAGNOSIS — Z794 Long term (current) use of insulin: Secondary | ICD-10-CM | POA: Diagnosis not present

## 2017-12-06 DIAGNOSIS — N183 Chronic kidney disease, stage 3 (moderate): Secondary | ICD-10-CM

## 2017-12-06 MED ORDER — INSULIN ASPART 100 UNIT/ML FLEXPEN: 4.0000 [IU] | PEN_INJECTOR | Freq: Three times a day (TID) | SUBCUTANEOUS | 3 refills | Status: DC

## 2017-12-06 MED ORDER — SEMAGLUTIDE(0.25 OR 0.5MG/DOS) 2 MG/1.5ML ~~LOC~~ SOPN: 0.2500 mg | PEN_INJECTOR | SUBCUTANEOUS | 11 refills | Status: DC

## 2017-12-06 NOTE — Patient Instructions (Addendum)
Your blood pressure is high today.  Please see your primary care provider soon, to have it rechecked check your blood sugar 4 times a day.  vary the time of day when you check, between before the 3 meals, and at bedtime.  also check if you have symptoms of your blood sugar being too high or too low.  please keep a record of the readings and bring it to your next appointment here (or you can bring the meter itself).  You can write it on any piece of paper.  please call us sooner if your blood sugar goes below 70, or if you have a lot of readings over 200.  Please message [place] with the name of your meter.  Then, I'll send a prescription to your pharmacy, for the strips.   Please continue the same NPH: 220 units daily (all with lunch), and:  Please take the novolog only if the blood sugar is over 300, and only 4 units at a time.   Please call or message [place] next week, to tell us how the blood sugar is doing.   Please change the Byetta to Ozempic, 0.25 mg weekly If you send [place] a blood sugar record showing you check 4 times a day, Ii'll request the continuous glucose monitor for you. Please come back for a follow-up appointment in 2-3 months.

## 2017-12-06 NOTE — Progress Notes (Signed)
Subjective:    Patient ID: [name], female    DOB: 15-Dec-1955, 62 y.o.   MRN: [id number]  HPI Pt returns for f/u of diabetes mellitus: DM type: Insulin-requiring type 2 Dx'ed: 2229 Complications: polyneuropathy, renal failure, NPDR, CAD, PAD, TIA, and toe amputations Therapy: insulin since 2005, and byetta. GDM: never DKA: never Severe hypoglycemia: never Pancreatitis: never Pancreatic imaging: normal on 2018 CT Other: her hands and vision are not good enough for syringe and vial; she takes qd insulin, due to poor results with multiple daily injections; QD insulin is NPH, due to renal failure.   Interval history: 2 mos ago, pt had 2 episodes of severe hypoglycemia, 1 week apart.  This happened mid-morning.  dtr says this was 1 hr after cbg was 400, for which she took 10 units of novolog.    Past Medical History:  Diagnosis Date  . Allergy   . Arthritis   . Chronic kidney disease   . Chronic pain   . Coronary artery disease   . Diabetes mellitus without complication ([place])   . Dyspnea   . GERD (gastroesophageal reflux disease)   . Heart murmur   . Hypertension   . Liver disease   . NASH (nonalcoholic steatohepatitis) 09/16/2016  . Obstructive sleep apnea   . Osteomyelitis ([place])   . Peripheral vascular disease ([place])   . Sleep apnea   . Stroke ([place])   . Ulcers of both great toes ([place]) 11/07/2016   right great toe, started as finger nail size and grew in 6 days     Past Surgical History:  Procedure Laterality Date  . ABDOMINAL HYSTERECTOMY    . AMPUTATION Bilateral 05/12/2017   Procedure: BILATERAL GREAT TOE AMPUTATION;  Surgeon: Newt Minion, MD;  Location: [facility];  Service: Orthopedics;  Laterality: Bilateral;  . BACK SURGERY    . CESAREAN SECTION    . CHOLECYSTECTOMY    . HERNIA REPAIR    . JOINT REPLACEMENT    . KNEE SURGERY    . SPINE SURGERY    . TOE AMPUTATION    . TOE AMPUTATION Bilateral    3 on right foot 2 left foot   . TONSILLECTOMY    . TUBAL  LIGATION      Social History   Socioeconomic History  . Marital status: Divorced    Spouse name: Not on file  . Number of children: 3  . Years of education: Not on file  . Highest education level: Not on file  Occupational History  . Not on file  Social Needs  . Financial resource strain: Not on file  . Food insecurity:    Worry: Not on file    Inability: Not on file  . Transportation needs:    Medical: Not on file    Non-medical: Not on file  Tobacco Use  . Smoking status: Never Smoker  . Smokeless tobacco: Never Used  Substance and Sexual Activity  . Alcohol use: No  . Drug use: No  . Sexual activity: Never  Lifestyle  . Physical activity:    Days per week: Not on file    Minutes per session: Not on file  . Stress: Not on file  Relationships  . Social connections:    Talks on phone: Not on file    Gets together: Not on file    Attends religious service: Not on file    Active member of club or organization: Not on file    Attends meetings  of clubs or organizations: Not on file    Relationship status: Not on file  . Intimate partner violence:    Fear of current or ex partner: Not on file    Emotionally abused: Not on file    Physically abused: Not on file    Forced sexual activity: Not on file  Other Topics Concern  . Not on file  Social History Narrative   ** Merged History Encounter **        Current Outpatient Medications on File Prior to Visit  Medication Sig Dispense Refill  . atorvastatin (LIPITOR) 80 MG tablet Take 1 tablet (80 mg total) by mouth daily at 6 PM. 90 tablet 3  . busPIRone (BUSPAR) 15 MG tablet Take 1 tablet (15 mg total) by mouth 3 (three) times daily. 270 tablet 3  . carvedilol (COREG) 25 MG tablet TAKE 1 TABLET BY MOUTH TWICE DAILY (Patient taking differently: 12.5 mg. ) 180 tablet 0  . clopidogrel (PLAVIX) 75 MG tablet Take 1 tablet (75 mg total) by mouth daily. 90 tablet 3  . diclofenac sodium (VOLTAREN) 1 % GEL Apply 2 g topically  as needed (muscle spasms).    . docusate sodium (COLACE) 100 MG capsule Take 1 capsule (100 mg total) by mouth 2 (two) times daily. 10 capsule 0  . ezetimibe (ZETIA) 10 MG tablet Take 1 tablet (10 mg total) by mouth daily. 90 tablet 3  . gabapentin (NEURONTIN) 300 MG capsule Take 1 capsule (300 mg total) by mouth 2 (two) times daily.    Marland Kitchen HYDROmorphone (DILAUDID) 8 MG tablet Take 8 mg by mouth 5 (five) times daily.   0  . Insulin NPH, Human,, Isophane, (HUMULIN N KWIKPEN) 100 UNIT/ML Kiwkpen Inject 220 Units into the skin daily before lunch. And pen needles 4/day.. 30 pen 11  . INSULIN SYRINGE 1CC/29G (B-D INS SYR ULTRAFINE 1CC/29G) 29G X 1/2" 1 ML MISC Used to give insulin injections 4x daily. 200 each 11  . polyethylene glycol (MIRALAX / GLYCOLAX) packet Take 17 g by mouth daily as needed for mild constipation. 14 each 0  . ranolazine (RANEXA) 1000 MG SR tablet Take 1 tablet (1,000 mg total) by mouth 2 (two) times daily. 180 tablet 3  . sertraline (ZOLOFT) 50 MG tablet Take 1 tablet (50 mg total) by mouth daily. 90 tablet 3  . tiZANidine (ZANAFLEX) 4 MG capsule Take 4 mg by mouth 3 (three) times daily.     No current facility-administered medications on file prior to visit.     Allergies  Allergen Reactions  . Furosemide Other (See Comments)    Dehydration leading to kidney failure  "my kidneys shutdown"  . Norvasc [Amlodipine Besylate] Swelling    "Severe edema"  . Prednisone Other (See Comments)    Ketoacidosis  . Vancomycin Other (See Comments)    Total organ shut down "It shut my kidneys down"  . Amlodipine Swelling    SWELLING/EDEMA REACTION UNSPECIFIED   . Clindamycin/Lincomycin Hives and Itching  . Doxycycline Hives and Rash  . Propranolol Cough    Bronchospasms  . Ibuprofen Other (See Comments)    WAS TOLD TO NOT TAKE THIS BECAUSE OF HER KIDNEYS AND LIVER  . Lisinopril Other (See Comments)    WAS TOLD TO NOT TAKE THIS BECAUSE OF HER KIDNEYS AND LIVER  . Lovastatin Other  (See Comments)    WAS TOLD TO NOT TAKE THIS BECAUSE OF HER KIDNEYS AND LIVER  . Norvasc [Amlodipine Besylate] Other (See Comments)  Severe edema  . Nsaids Other (See Comments)    WAS TOLD TO NOT TAKE THIS BECAUSE OF HER KIDNEYS AND LIVER  . Sulfa Antibiotics Nausea Only    Family History  Problem Relation Age of Onset  . Cancer Mother        Thymoma, metastatic  . Heart disease Father   . Stroke Father   . Stroke Maternal Grandfather   . Heart disease Paternal Grandfather   . Multiple sclerosis Sister   . Epilepsy Sister   . Diabetes Neg Hx     BP (!) 142/64 (BP Location: Right Arm, Patient Position: Sitting, Cuff Size: Large)   Pulse 81   Ht 5' 5"  (1.651 m)   Wt 248 lb 9.6 oz (112.8 kg)   SpO2 96%   BMI 41.37 kg/m   Review of Systems She has gained a few lbs.     Objective:   Physical Exam VITAL SIGNS:  See vs page GENERAL: no distress Pulses: dorsalis pedis intact bilat.   CV: 2+ bilat leg edema Skin:  Healed ulcer on the right foot.  normal color and temp on the feet. Neuro: sensation is intact to touch on the feet MSK: absent are the left 2nd and 1-3 right toes.  The left great and 3rd toes are partially amputated.    PULSES: dorsalis pedis intact bilat.    Lab Results  Component Value Date   HGBA1C 12.0 (H) 11/04/2017       Assessment & Plan:  HTN: is noted today.  Insulin-requiring type 2 DM, with PAD: worse.  Hypoglycemia, severe: we have to resolve this first.  Plan is for continuous glucose monitor.    Patient Instructions  Your blood pressure is high today.  Please see your primary care provider soon, to have it rechecked check your blood sugar 4 times a day.  vary the time of day when you check, between before the 3 meals, and at bedtime.  also check if you have symptoms of your blood sugar being too high or too low.  please keep a record of the readings and bring it to your next appointment here (or you can bring the meter itself).  You can  write it on any piece of paper.  please call us sooner if your blood sugar goes below 70, or if you have a lot of readings over 200.  Please message [place] with the name of your meter.  Then, I'll send a prescription to your pharmacy, for the strips.   Please continue the same NPH: 220 units daily (all with lunch), and:  Please take the novolog only if the blood sugar is over 300, and only 4 units at a time.   Please call or message [place] next week, to tell us how the blood sugar is doing.   Please change the Byetta to Ozempic, 0.25 mg weekly If you send [place] a blood sugar record showing you check 4 times a day, Ii'll request the continuous glucose monitor for you. Please come back for a follow-up appointment in 2-3 months.

## 2017-12-07 ENCOUNTER — Encounter: Payer: Self-pay | Admitting: Endocrinology

## 2017-12-07 ENCOUNTER — Telehealth: Payer: Self-pay | Admitting: Endocrinology

## 2017-12-07 MED ORDER — LIRAGLUTIDE 18 MG/3ML ~~LOC~~ SOPN: 1.8000 mg | PEN_INJECTOR | Freq: Every day | SUBCUTANEOUS | 11 refills | Status: AC

## 2017-12-07 NOTE — Telephone Encounter (Signed)
[employer] is calling to speak to someone about a PA on the patients [employer]  They have some questions regarding this  Please advise   [place]- [phone] REF #   [id number]

## 2017-12-07 NOTE — Telephone Encounter (Signed)
Returned call. Informed that a Rx was sent to pt pharmacy to change from [place] to [place]. Requested to know why the increased doage of Ozempic and change to Victoza. Advised that pt CBG's are currently 600+ and pt is refusing tx't from ED for better control and management. States she will inform pharmacist processing claim review.

## 2017-12-07 NOTE — Telephone Encounter (Signed)
please call patient: Ins wants you to change ozempic to victoza.  I have sent a prescription to your pharmacy.  Please start at 0.6 mg qd.  Then slowly increase to 1.8 mg qd.  By slowly increasing, you can avoid the nausea.  I'll see you next time.

## 2017-12-07 NOTE — Telephone Encounter (Signed)
Per our discussion re: Patient's daughter [name] ph# [phone] called re: [name] called 4 hours ago and was told to go to the Hospital. Blood sugars over 600. Patient refused to go into Hospital. Please call [name] at ph# listed above.

## 2017-12-07 NOTE — Telephone Encounter (Signed)
Per front office staff, pt was transported by her daughter to ED and is refusing treatment from ED staff. Dtr is stating her mother's CBG's are 600+ and would like to know what she can do treat her mother. Advice to proceed to ED for treatment has not resulted as we had hoped.

## 2017-12-07 NOTE — Telephone Encounter (Signed)
Pt stated that she understood instructions and that she would call back with progress.

## 2017-12-07 NOTE — Telephone Encounter (Signed)
Called pt and informed of her of the denial for Ozempic, new Rx and instructions to gradually increase dosage to minimize or reduce risk of nausea. Verbalized acceptance and understanding.

## 2017-12-07 NOTE — Telephone Encounter (Signed)
You can take the 4 units of novolog as often as every hour.  Please call or message [place] in a few days, to tell us how the blood sugar is doing

## 2017-12-08 ENCOUNTER — Telehealth: Payer: Self-pay | Admitting: Endocrinology

## 2017-12-08 ENCOUNTER — Encounter

## 2017-12-08 NOTE — Telephone Encounter (Signed)
Pt has been taking 4 units Novolog every hours as she was advised yesterday. Today, CBG's ranged 354-374 but jumped to 500 - 600 this afternoon, even when taking Novolog q hour. States she took 220 units Humulin around 3 today along with 4 units Novolog and CBG's are still ranging 500. States pt is asymptomatic but cannot get control of her CBG's. Message sent to Dr, Loanne Drilling for orders.

## 2017-12-08 NOTE — Telephone Encounter (Signed)
Please advise pt to go to ER.  If she declines, continue novolog, and drink plenty of fluids.

## 2017-12-08 NOTE — Telephone Encounter (Signed)
Please advise 

## 2017-12-08 NOTE — Telephone Encounter (Signed)
Patients daughter Garrison [place] called today to advise that patient;s blood sugar has been running in the 550's over the last several hours.  Phone number for a return call is [phone]

## 2017-12-08 NOTE — Telephone Encounter (Signed)
Called pt and her dtr. Strongly encouraged pt to proceed to the ED for further evaluation. Dtr expressed frustration because pt will not follow instructions to be evaluated. Reassurance provided. Dtr and pt aware that IF pt refuses to seek medical attention, will need to continue Novolog 4 units every hour and Humulin as ordered. Education provided, and advised the longer this condition remains untreated, the more harm that is being done to her body. Dtr and pt and verbalized acceptance and understanding of advice and education. States they will proceed to ED.

## 2017-12-08 NOTE — Telephone Encounter (Signed)
Pt can take the novolog as often as 4 units every hour. Please call or message [place] in 1-2 days, to tell us how the blood sugar is doing

## 2017-12-08 NOTE — Telephone Encounter (Signed)
Per [facility] "Caller states she has a sugar level of 247 now. She came home and took her Victosa and Humalin. It is going down well and she is eating salad.

## 2017-12-09 NOTE — Telephone Encounter (Signed)
Spoke to pt and she stated that she feels better today and that her blood sugar today was 324.

## 2017-12-09 NOTE — Telephone Encounter (Signed)
Good to hear.  Can you please call back tomorrow, with another update.

## 2017-12-10 ENCOUNTER — Telehealth: Payer: Self-pay

## 2017-12-10 NOTE — Telephone Encounter (Signed)
Pt daughter called stating CBG was 382 this morning, took Victoza as prescribed and additional 4 untis Novolog. Ate her breakfast then to a nap. Upon waking, checked CBG with results of 538. Took Humulin as prescribed and an additional 4 units of Novolog. Daughter confirmed pt was seen at ED and was stable to be d/c'd home with no changes to her current regimen. Requesting further advice. Message sent to Dr. Loanne Drilling for orders.

## 2017-12-10 NOTE — Telephone Encounter (Signed)
Called pt and her dtr. Informed of new orders and to call with an update on Monday. If symptoms worsen, pt and dtr have been informed to proceed to ED or call 911 for medical attention. Verbalized acceptance and understanding.

## 2017-12-10 NOTE — Telephone Encounter (Signed)
Returned call stating blood sugar was 382 without eating and without taking insulin. Message sent to Dr. Loanne Drilling to provide him with an update

## 2017-12-10 NOTE — Telephone Encounter (Signed)
Please call pt for a status update

## 2017-12-10 NOTE — Telephone Encounter (Signed)
Called pt to follow up. States she is feeling really good. Is in the process of getting out of bed so she can check her CBG. Will call with results.

## 2017-12-10 NOTE — Telephone Encounter (Signed)
OK, please increase NPH to 240 units qam.  Please call or message [place] next week, to tell us how the blood sugar is doing.

## 2017-12-10 NOTE — Telephone Encounter (Signed)
Opened in error

## 2017-12-13 ENCOUNTER — Other Ambulatory Visit: Payer: Self-pay

## 2017-12-13 ENCOUNTER — Encounter: Payer: Self-pay | Admitting: Family Medicine

## 2017-12-13 ENCOUNTER — Ambulatory Visit (INDEPENDENT_AMBULATORY_CARE_PROVIDER_SITE_OTHER): Payer: Medicare HMO | Admitting: Family Medicine

## 2017-12-13 VITALS — BP 88/64 | HR 81 | Temp 97.6°F | Resp 14 | Ht 65.0 in | Wt 245.0 lb

## 2017-12-13 DIAGNOSIS — N183 Chronic kidney disease, stage 3 unspecified: Secondary | ICD-10-CM

## 2017-12-13 DIAGNOSIS — I129 Hypertensive chronic kidney disease with stage 1 through stage 4 chronic kidney disease, or unspecified chronic kidney disease: Secondary | ICD-10-CM

## 2017-12-13 DIAGNOSIS — I9589 Other hypotension: Secondary | ICD-10-CM

## 2017-12-13 DIAGNOSIS — E1322 Other specified diabetes mellitus with diabetic chronic kidney disease: Secondary | ICD-10-CM

## 2017-12-13 DIAGNOSIS — E1142 Type 2 diabetes mellitus with diabetic polyneuropathy: Secondary | ICD-10-CM

## 2017-12-13 DIAGNOSIS — Z23 Encounter for immunization: Secondary | ICD-10-CM

## 2017-12-13 DIAGNOSIS — R739 Hyperglycemia, unspecified: Secondary | ICD-10-CM

## 2017-12-13 NOTE — Patient Instructions (Signed)
° ° ° °  If you have lab work done today you will be contacted with your lab results within the next 2 weeks.  If you have not heard from us then please contact us. The fastest way to get your results is to register for My Chart. ° ° °IF you received an x-ray today, you will receive an invoice from [facility]. Please contact  Radiology at [phone] with questions or concerns regarding your invoice.  ° °IF you received labwork today, you will receive an invoice from [facility]. Please contact [facility] at [phone] with questions or concerns regarding your invoice.  ° °Our billing staff will not be able to assist you with questions regarding bills from these companies. ° °You will be contacted with the lab results as soon as they are available. The fastest way to get your results is to activate your My Chart account. Instructions are located on the last page of this paperwork. If you have not heard from us regarding the results in 2 weeks, please contact this office. °  ° ° ° °

## 2017-12-13 NOTE — Progress Notes (Signed)
Chief Complaint  Patient presents with  . Follow-up    diabetes Blood sugar has been in the 400 last few days.  tues, wed of last week in 7     HPI  Uncontrolled Diabetes She has continues to have hyperglycemia Her sugars are 400s-600s She states that Dr. Loanne Drilling told her to increase her NPH to 240 units qAM She reports that her daughter is cooking better and the food is better for her She had some vegetables a few days ago She is snacking less  Chronic Pain She has chronic pain in the legs She is getting opiates for this managed by pain management  She also has diabetic neuropathy and is s/p toe amputations  Iatrogenic Hypotension She continues to have low blood pressures in the office but does not check at home  She reports that "I feel better than I have in years" She denies chest pains, palpitations, weakness but reports fatigue She is compliant with her medications and was only taking a half dose as instructed    Past Medical History:  Diagnosis Date  . Allergy   . Arthritis   . Chronic kidney disease   . Chronic pain   . Coronary artery disease   . Diabetes mellitus without complication ([place])   . Dyspnea   . GERD (gastroesophageal reflux disease)   . Heart murmur   . Hypertension   . Liver disease   . NASH (nonalcoholic steatohepatitis) 09/16/2016  . Obstructive sleep apnea   . Osteomyelitis ([place])   . Peripheral vascular disease ([place])   . Sleep apnea   . Stroke ([place])   . Ulcers of both great toes ([place]) 11/07/2016   right great toe, started as finger nail size and grew in 6 days     Current Outpatient Medications  Medication Sig Dispense Refill  . atorvastatin (LIPITOR) 80 MG tablet Take 1 tablet (80 mg total) by mouth daily at 6 PM. 90 tablet 3  . busPIRone (BUSPAR) 15 MG tablet Take 1 tablet (15 mg total) by mouth 3 (three) times daily. 270 tablet 3  . carvedilol (COREG) 3.125 MG tablet Take 1 tablet (3.125 mg total) by mouth daily. 90 tablet 0  .  clopidogrel (PLAVIX) 75 MG tablet Take 1 tablet (75 mg total) by mouth daily. 90 tablet 3  . diclofenac sodium (VOLTAREN) 1 % GEL Apply 2 g topically as needed (muscle spasms).    . docusate sodium (COLACE) 100 MG capsule Take 1 capsule (100 mg total) by mouth 2 (two) times daily. 10 capsule 0  . ezetimibe (ZETIA) 10 MG tablet Take 1 tablet (10 mg total) by mouth daily. 90 tablet 3  . gabapentin (NEURONTIN) 300 MG capsule Take 1 capsule (300 mg total) by mouth 2 (two) times daily.    Marland Kitchen HYDROmorphone (DILAUDID) 8 MG tablet Take 8 mg by mouth 5 (five) times daily.   0  . insulin aspart (NOVOLOG) 100 UNIT/ML FlexPen Inject 4 Units into the skin 3 (three) times daily before meals. Take only if blood sugar is over 300 15 mL 3  . Insulin NPH, Human,, Isophane, (HUMULIN N KWIKPEN) 100 UNIT/ML Kiwkpen Inject 220 Units into the skin daily before lunch. And pen needles 4/day.. 30 pen 11  . INSULIN SYRINGE 1CC/29G (B-D INS SYR ULTRAFINE 1CC/29G) 29G X 1/2" 1 ML MISC Used to give insulin injections 4x daily. 200 each 11  . liraglutide (VICTOZA) 18 MG/3ML SOPN Inject 0.3 mLs (1.8 mg total) into the skin daily. 9  mL 11  . polyethylene glycol (MIRALAX / GLYCOLAX) packet Take 17 g by mouth daily as needed for mild constipation. 14 each 0  . ranolazine (RANEXA) 1000 MG SR tablet Take 1 tablet (1,000 mg total) by mouth 2 (two) times daily. 180 tablet 3  . sertraline (ZOLOFT) 50 MG tablet Take 1 tablet (50 mg total) by mouth daily. 90 tablet 3  . tiZANidine (ZANAFLEX) 4 MG capsule Take 4 mg by mouth 3 (three) times daily.     No current facility-administered medications for this visit.     Allergies:  Allergies  Allergen Reactions  . Furosemide Other (See Comments)    Dehydration leading to kidney failure  "my kidneys shutdown"  . Norvasc [Amlodipine Besylate] Swelling    "Severe edema"  . Prednisone Other (See Comments)    Ketoacidosis  . Vancomycin Other (See Comments)    Total organ shut down "It shut  my kidneys down"  . Amlodipine Swelling    SWELLING/EDEMA REACTION UNSPECIFIED   . Clindamycin/Lincomycin Hives and Itching  . Doxycycline Hives and Rash  . Propranolol Cough    Bronchospasms  . Ibuprofen Other (See Comments)    WAS TOLD TO NOT TAKE THIS BECAUSE OF HER KIDNEYS AND LIVER  . Lisinopril Other (See Comments)    WAS TOLD TO NOT TAKE THIS BECAUSE OF HER KIDNEYS AND LIVER  . Lovastatin Other (See Comments)    WAS TOLD TO NOT TAKE THIS BECAUSE OF HER KIDNEYS AND LIVER  . Norvasc [Amlodipine Besylate] Other (See Comments)    Severe edema  . Nsaids Other (See Comments)    WAS TOLD TO NOT TAKE THIS BECAUSE OF HER KIDNEYS AND LIVER  . Sulfa Antibiotics Nausea Only    Past Surgical History:  Procedure Laterality Date  . ABDOMINAL HYSTERECTOMY    . AMPUTATION Bilateral 05/12/2017   Procedure: BILATERAL GREAT TOE AMPUTATION;  Surgeon: Newt Minion, MD;  Location: [place];  Service: Orthopedics;  Laterality: Bilateral;  . BACK SURGERY    . CESAREAN SECTION    . CHOLECYSTECTOMY    . HERNIA REPAIR    . JOINT REPLACEMENT    . KNEE SURGERY    . SPINE SURGERY    . TOE AMPUTATION    . TOE AMPUTATION Bilateral    3 on right foot 2 left foot   . TONSILLECTOMY    . TUBAL LIGATION      Social History   Socioeconomic History  . Marital status: Divorced    Spouse name: Not on file  . Number of children: 3  . Years of education: Not on file  . Highest education level: Not on file  Occupational History  . Not on file  Social Needs  . Financial resource strain: Not on file  . Food insecurity:    Worry: Not on file    Inability: Not on file  . Transportation needs:    Medical: Not on file    Non-medical: Not on file  Tobacco Use  . Smoking status: Never Smoker  . Smokeless tobacco: Never Used  Substance and Sexual Activity  . Alcohol use: No  . Drug use: No  . Sexual activity: Never  Lifestyle  . Physical activity:    Days per week: Not on file    Minutes per  session: Not on file  . Stress: Not on file  Relationships  . Social connections:    Talks on phone: Not on file    Gets together: Not on  file    Attends religious service: Not on file    Active member of club or organization: Not on file    Attends meetings of clubs or organizations: Not on file    Relationship status: Not on file  Other Topics Concern  . Not on file  Social History Narrative   ** Merged History Encounter **        Family History  Problem Relation Age of Onset  . Cancer Mother        Thymoma, metastatic  . Heart disease Father   . Stroke Father   . Stroke Maternal Grandfather   . Heart disease Paternal Grandfather   . Multiple sclerosis Sister   . Epilepsy Sister   . Diabetes Neg Hx      ROS Review of Systems See HPI Constitution: No fevers or chills No malaise No diaphoresis Skin: No rash or itching Eyes: no blurry vision, no double vision GU: no dysuria or hematuria Neuro: no dizziness or headaches  all others reviewed and negative   Objective: Vitals:   12/13/17 1131 12/13/17 1211  BP: (!) 93/53 (!) 88/64  Pulse: 81   Resp: 14   Temp: 97.6 F (36.4 C)   TempSrc: Oral   SpO2: 97%   Weight: 245 lb (111.1 kg)   Height: 5' 5"  (1.651 m)     Physical Exam  Constitutional: She is oriented to person, place, and time. She appears well-developed and well-nourished.  Eyes: Conjunctivae and EOM are normal.  Cardiovascular: Normal rate, regular rhythm and normal heart sounds.  Pulmonary/Chest: Effort normal and breath sounds normal. No stridor. No respiratory distress. She has no wheezes.  Neurological: She is alert and oriented to person, place, and time.  Skin: Skin is warm. Capillary refill takes less than 2 seconds.  Psychiatric: She has a normal mood and affect. Her behavior is normal. Judgment and thought content normal.    Assessment and Plan Jamala was seen today for follow-up.  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 Loanne Drilling Continue current meds Discussed home vs. Nursing home Uncertain what her barriers are but at this point patient is very high risk given her continued hyperglycemia  Iatrogenic hypotension Currently completely asymptomatic Discussed that due to beta blockade she could not sense that her blood pressure is low until it is too low   She should monitor her bp at home Decreased her dose to coreg 3.169m daily to minimize the risk of continued hypotension  Flu vaccine need Flu vaccine given today  Diabetic peripheral neuropathy ([place]- continue current pain meds  Other orders -     Flu Vaccine QUAD 36+ mos IM -     carvedilol (COREG) 3.125 MG tablet; Take 1 tablet (3.125 mg total) by mouth daily.      [place]

## 2017-12-14 MED ORDER — CARVEDILOL 3.125 MG PO TABS: 3.1250 mg | ORAL_TABLET | Freq: Every day | ORAL | 0 refills | Status: DC

## 2018-01-04 ENCOUNTER — Ambulatory Visit: Payer: Medicare HMO | Admitting: Family Medicine

## 2018-01-17 NOTE — Progress Notes (Signed)
New co/wchhtriad/wchh-triad/all Mroyals-megan royals  Customer: [place], [phone] Organizaton: new co/wchhtriad/wchh-triad/all Type: PH--physicians communications Author: royals, megan  Subject: please fax md Client canceled for today not feeling well.

## 2018-02-08 ENCOUNTER — Encounter: Payer: Medicare HMO | Admitting: Nutrition

## 2018-02-08 ENCOUNTER — Ambulatory Visit: Payer: Medicare HMO | Admitting: Endocrinology

## 2018-02-08 ENCOUNTER — Other Ambulatory Visit: Payer: Self-pay | Admitting: Endocrinology

## 2018-02-09 ENCOUNTER — Telehealth: Payer: Self-pay | Admitting: Endocrinology

## 2018-02-09 ENCOUNTER — Other Ambulatory Visit: Payer: Self-pay

## 2018-02-09 DIAGNOSIS — E1165 Type 2 diabetes mellitus with hyperglycemia: Secondary | ICD-10-CM

## 2018-02-09 MED ORDER — INSULIN ISOPHANE HUMAN 100 UNIT/ML KWIKPEN: 240.0000 [IU] | PEN_INJECTOR | SUBCUTANEOUS | 1 refills | Status: DC

## 2018-02-09 NOTE — Telephone Encounter (Signed)
Pharmacy called stating patient is stating Humlin N was increased to 240 units. If so pharmacy is requesting new RX. Please Advise, thanks

## 2018-02-09 NOTE — Telephone Encounter (Signed)
Request authorized and refill sent as requested.

## 2018-02-15 ENCOUNTER — Ambulatory Visit: Payer: Self-pay | Admitting: *Deleted

## 2018-02-15 NOTE — Telephone Encounter (Signed)
"  Patient daughter is calling and states that her mother got sick after taking her medicine and had eaten lunch. States it was projectile vomiting. She is needing to know if she is supposed to take another round of medicine since she was sick afterwards. Needing advice. Please contact".   Called pt and her daughter back and they stated that she vomited after taking her morning medications and had eaten. The daughter stated that it was projectile.   Her blood sugar this morning was 352. She did take her insulins as prescribed.  She was wondering if she could repeat her medications (dilaudid). Advised that it is hard to know if the medication did or did not come back up. She said that she saw the vegetables that she had eaten.  Advised to wait and recommend taking her medication about an hour earlier than the scheduled dose. Pt voiced understanding.  Her blood sugar is now 200. Pt states she feels better. Denies fever and only sl nauseated now. Does not feel like she need medication for n and v. She will let the office know if that changes. Will route to PCP.

## 2018-02-18 ENCOUNTER — Telehealth: Payer: Self-pay | Admitting: Endocrinology

## 2018-02-18 ENCOUNTER — Telehealth: Payer: Self-pay

## 2018-02-18 NOTE — Telephone Encounter (Signed)
Called patient re: Scheduled appointment for 02/24/18 at 4:00 p.m. Patient will call back if she is able to come in sooner.

## 2018-02-18 NOTE — Telephone Encounter (Signed)
Received request to complete PA for Humulin U-100 Kwikpen. Per Dr. Loanne Drilling, unable to complete without appt. Message routed to scheduling coordinator for scheduling purposes. Documents placed in referrals file at nurses desk for future reference and completion.

## 2018-02-18 NOTE — Telephone Encounter (Signed)
Patients daughter had called the office in concern to patient needing a PA done for insulin.  Spoke with Melissa and Nash Dimmer and stated daughter had called upset about the PA with medication. The only alternative we where able to offer was for the patient to request a generic at the nearest [facility]. Patient was also upset her daughter could not bring her Monday and stated she will call us Monday to let us know if she can make it.

## 2018-02-21 ENCOUNTER — Telehealth: Payer: Self-pay | Admitting: Endocrinology

## 2018-02-21 DIAGNOSIS — E1165 Type 2 diabetes mellitus with hyperglycemia: Secondary | ICD-10-CM

## 2018-02-21 NOTE — Telephone Encounter (Signed)
Until a response is received, is there an alternative insulin to call in until she is seen and PA determination completed?

## 2018-02-21 NOTE — Telephone Encounter (Signed)
Pt has been informed but still would prefer to await PA response.

## 2018-02-21 NOTE — Telephone Encounter (Signed)
Pt has rescheduled appt to 02/24/18. You had asked that she schedule an appt so we could complete PA for Humulin U-100 Kwikpen. Pt claims she is out and needing a Rx for her insulin. Message routed to Dr. Loanne Drilling to advise.

## 2018-02-21 NOTE — Telephone Encounter (Signed)
PA initiated today through Cover My Meds.  Karyna Felber Key: [id number] - PA Case ID: [id number] # [id number]  Status: Sent to Plan today  Drug: HumuLIN N KwikPen 100UNIT/ML pen-injectors  Form: Administrator, sports PA Form Will await insurance response re: approval/denial.

## 2018-02-21 NOTE — Telephone Encounter (Signed)
PA for Humulin U-100 Kwikpen received 02/17/18. Per Dr. Loanne Drilling, pt requires appt before completing PA. Documents at [employer] in referrals file. Pt is scheduled to be seen today. PA CANNOT be completed without an appt

## 2018-02-21 NOTE — Telephone Encounter (Signed)
PA for Humulin U-100 Kwikpen received 02/17/18. Per Dr. Loanne Drilling, pt requires appt before completing PA. Documents at [employer] in referrals file. Pt is scheduled to be seen today.

## 2018-02-21 NOTE — Telephone Encounter (Signed)
She should buy a vial of NPH insulin at [facility], and a few syringes

## 2018-02-21 NOTE — Telephone Encounter (Signed)
Pharmacy called to confirm we received Prior Auth information on January 14th. States patient has come into the pharmacy expecting it.

## 2018-02-21 NOTE — Telephone Encounter (Signed)
Ok, please start PA process.

## 2018-02-22 MED ORDER — INSULIN ISOPHANE HUMAN 100 UNIT/ML KWIKPEN: 240.0000 [IU] | PEN_INJECTOR | SUBCUTANEOUS | 11 refills | Status: AC

## 2018-02-22 NOTE — Telephone Encounter (Signed)
Received notification from [facility] that [name] has been APPROVED for 30 or 90 day supply ONLY. [employer] did not provide a length of time for which approval has been established. Documents have been reviewed by Dr. Loanne Drilling, labeled and placed in scan file for HIM scanning purposes. Rx has been re-sent to [facility].

## 2018-02-22 NOTE — Addendum Note (Signed)
Addended by: Hardie Pulley, Avyan Livesay J on: 02/22/2018 03:51 PM   Modules accepted: Orders

## 2018-02-22 NOTE — Telephone Encounter (Signed)
FYI: See message below

## 2018-02-24 ENCOUNTER — Ambulatory Visit: Payer: Medicare HMO | Admitting: Endocrinology

## 2018-03-08 ENCOUNTER — Encounter: Payer: Self-pay | Admitting: Endocrinology

## 2018-03-08 ENCOUNTER — Ambulatory Visit: Payer: Medicare HMO | Admitting: Endocrinology

## 2018-03-08 NOTE — Telephone Encounter (Signed)
Please refer to pt comment below.

## 2018-03-17 ENCOUNTER — Other Ambulatory Visit: Payer: Self-pay | Admitting: Family Medicine

## 2018-03-19 NOTE — Telephone Encounter (Signed)
Can we find out where this patient is getting primary care? If she is still coming to PCP she should be set up for an office visit due to her chronic medical problems.  She has not been seen in a while. Please set her up for an appointment.

## 2018-03-30 ENCOUNTER — Other Ambulatory Visit: Payer: Self-pay | Admitting: Family Medicine

## 2018-03-30 DIAGNOSIS — G459 Transient cerebral ischemic attack, unspecified: Secondary | ICD-10-CM

## 2018-03-30 NOTE — Telephone Encounter (Signed)
I called pt regarding her Plavix refill.   She is due an office visit this month (February) for a 3 month f/u per Dr. Nolon Rod.   She has canceled her last scheduled appts.   She let me know her daughter provides her transportation and her schedule has changed.   The pt is going to check with her daughter and call us back for an appt.   I let her know I would give her a 30 day supply with no refills.    She assured me she would call us for an appt within that time.

## 2018-04-03 DIAGNOSIS — G894 Chronic pain syndrome: Secondary | ICD-10-CM | POA: Diagnosis not present

## 2018-04-03 DIAGNOSIS — G8929 Other chronic pain: Secondary | ICD-10-CM | POA: Diagnosis not present

## 2018-04-03 DIAGNOSIS — R531 Weakness: Secondary | ICD-10-CM | POA: Diagnosis not present

## 2018-04-03 DIAGNOSIS — M8619 Other acute osteomyelitis, multiple sites: Secondary | ICD-10-CM | POA: Diagnosis not present

## 2018-04-15 ENCOUNTER — Other Ambulatory Visit: Payer: Self-pay | Admitting: Family Medicine

## 2018-04-15 NOTE — Telephone Encounter (Signed)
Requested Prescriptions  Pending Prescriptions Disp Refills  . atorvastatin (LIPITOR) 80 MG tablet [Pharmacy Med Name: ATORVASTATIN 80MG TABLETS] 90 tablet 2    Sig: TAKE 1 TABLET BY MOUTH ONCE DAILY AT 6PM     Cardiovascular:  Antilipid - Statins Failed - 04/15/2018  9:26 AM      Failed - HDL in normal range and within 360 days    HDL  Date Value Ref Range Status  04/23/2017 28 (L) >39 mg/dL Final         Failed - Triglycerides in normal range and within 360 days    Triglycerides  Date Value Ref Range Status  04/23/2017 207 (H) 0 - 149 mg/dL Final         Passed - Total Cholesterol in normal range and within 360 days    Cholesterol, Total  Date Value Ref Range Status  04/23/2017 131 100 - 199 mg/dL Final         Passed - LDL in normal range and within 360 days    LDL Calculated  Date Value Ref Range Status  04/23/2017 62 0 - 99 mg/dL Final         Passed - Patient is not pregnant      Passed - Valid encounter within last 12 months    Recent Outpatient Visits          4 months ago Iatrogenic hypotension   Primary Care at [facility], Zoe A, MD   5 months ago 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, Zoe A, MD   6 months ago Postural dizziness with presyncope   Primary Care at [facility], Arlie Solomons, MD   6 months ago Encounter for PPD skin test reading   Primary Care at [facility], Arlie Solomons, MD   6 months ago Frequent falls   Primary Care at [facility], Arlie Solomons, MD

## 2018-04-25 DIAGNOSIS — G894 Chronic pain syndrome: Secondary | ICD-10-CM | POA: Diagnosis not present

## 2018-04-25 DIAGNOSIS — M25512 Pain in left shoulder: Secondary | ICD-10-CM | POA: Diagnosis not present

## 2018-04-25 DIAGNOSIS — M79606 Pain in leg, unspecified: Secondary | ICD-10-CM | POA: Diagnosis not present

## 2018-04-25 DIAGNOSIS — Z79891 Long term (current) use of opiate analgesic: Secondary | ICD-10-CM | POA: Diagnosis not present

## 2018-04-25 DIAGNOSIS — M25561 Pain in right knee: Secondary | ICD-10-CM | POA: Diagnosis not present

## 2018-04-25 DIAGNOSIS — M545 Low back pain: Secondary | ICD-10-CM | POA: Diagnosis not present

## 2018-04-29 ENCOUNTER — Other Ambulatory Visit: Payer: Self-pay | Admitting: Family Medicine

## 2018-04-29 DIAGNOSIS — G459 Transient cerebral ischemic attack, unspecified: Secondary | ICD-10-CM

## 2018-04-29 NOTE — Telephone Encounter (Signed)
Please advise on refill request only #30 was given at last refill

## 2018-05-04 DIAGNOSIS — G894 Chronic pain syndrome: Secondary | ICD-10-CM | POA: Diagnosis not present

## 2018-05-04 DIAGNOSIS — M8619 Other acute osteomyelitis, multiple sites: Secondary | ICD-10-CM | POA: Diagnosis not present

## 2018-05-04 DIAGNOSIS — G8929 Other chronic pain: Secondary | ICD-10-CM | POA: Diagnosis not present

## 2018-05-04 DIAGNOSIS — R531 Weakness: Secondary | ICD-10-CM | POA: Diagnosis not present

## 2018-05-07 ENCOUNTER — Other Ambulatory Visit: Payer: Self-pay | Admitting: Endocrinology

## 2018-05-07 DIAGNOSIS — E162 Hypoglycemia, unspecified: Secondary | ICD-10-CM | POA: Diagnosis not present

## 2018-05-08 NOTE — Telephone Encounter (Signed)
Please refill x 1 Ov is due Last refill until ov

## 2018-05-11 IMAGING — DX DG ABDOMEN 1V
2 series · 2 of 2 positions shown · non-contrast
Comparison: Abdominal ultrasound and CT 09/20/2016

CLINICAL DATA: Abdominal pain and distention.  Fluid shift.

EXAM:
ABDOMEN - 1 VIEW

[abdomen kub (1 of 2)]
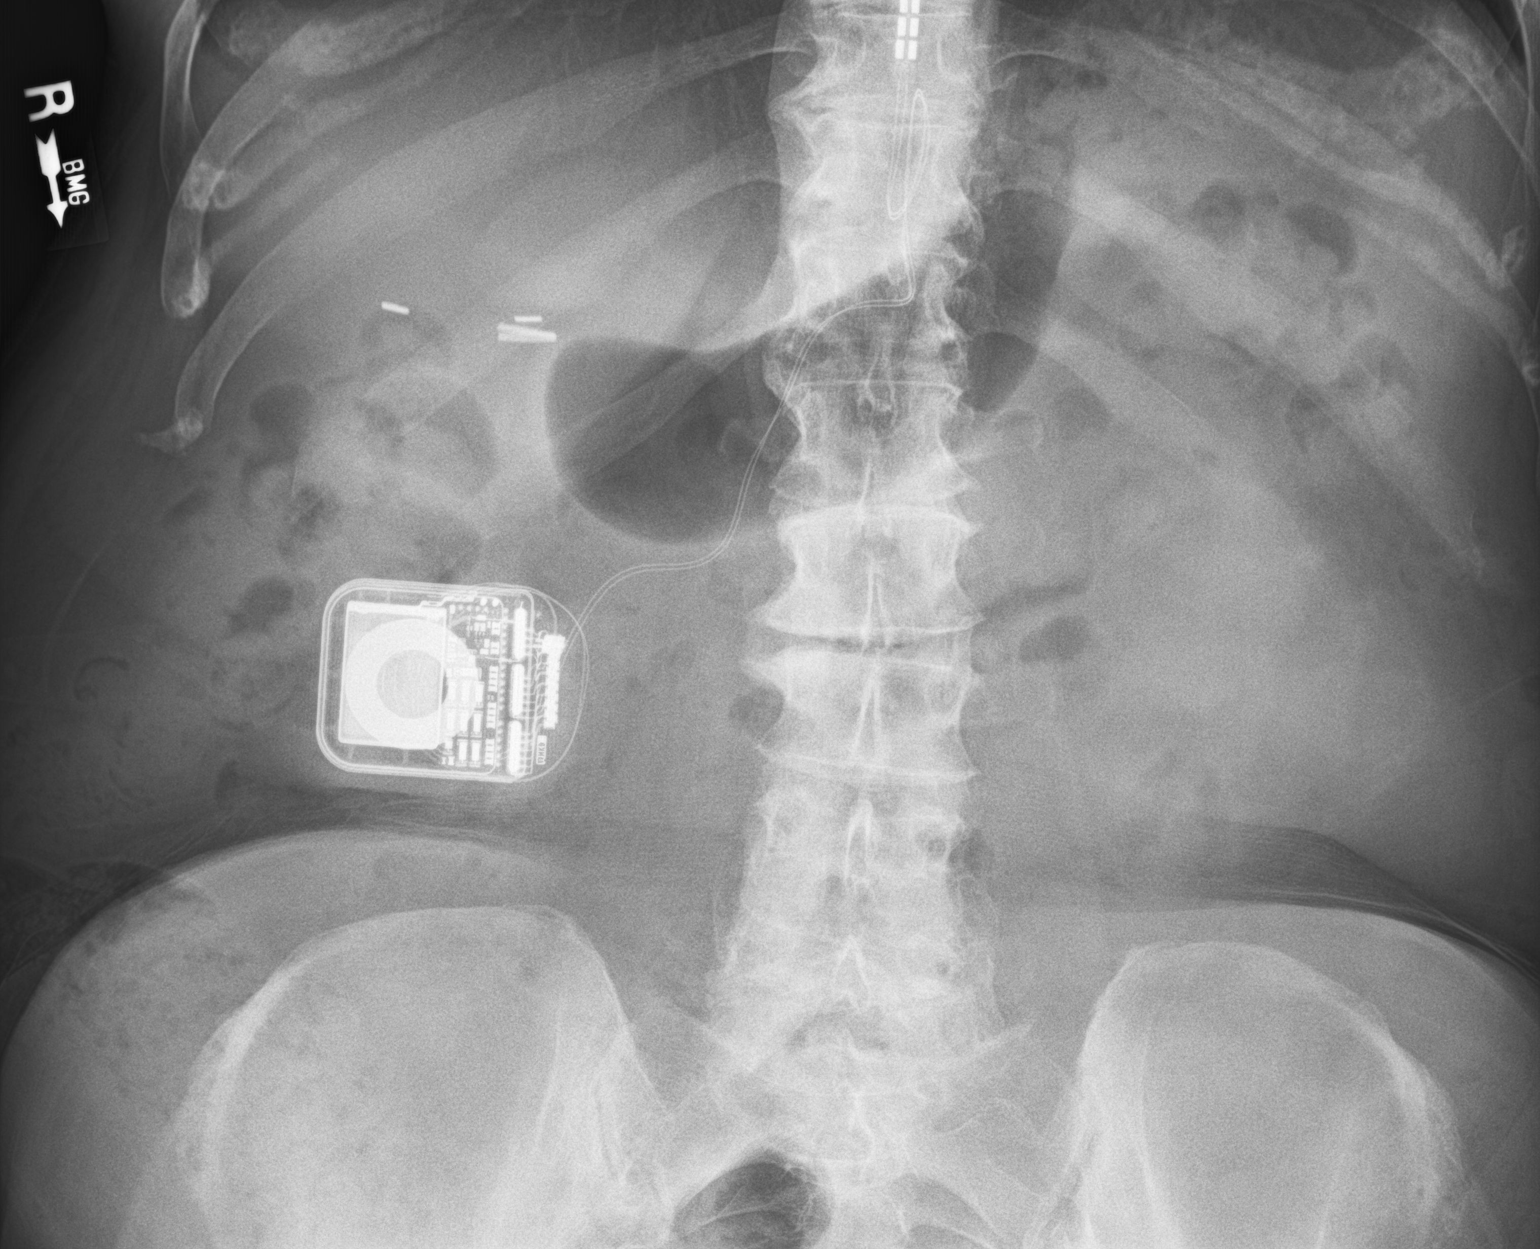

[abdomen kub (2 of 2)]
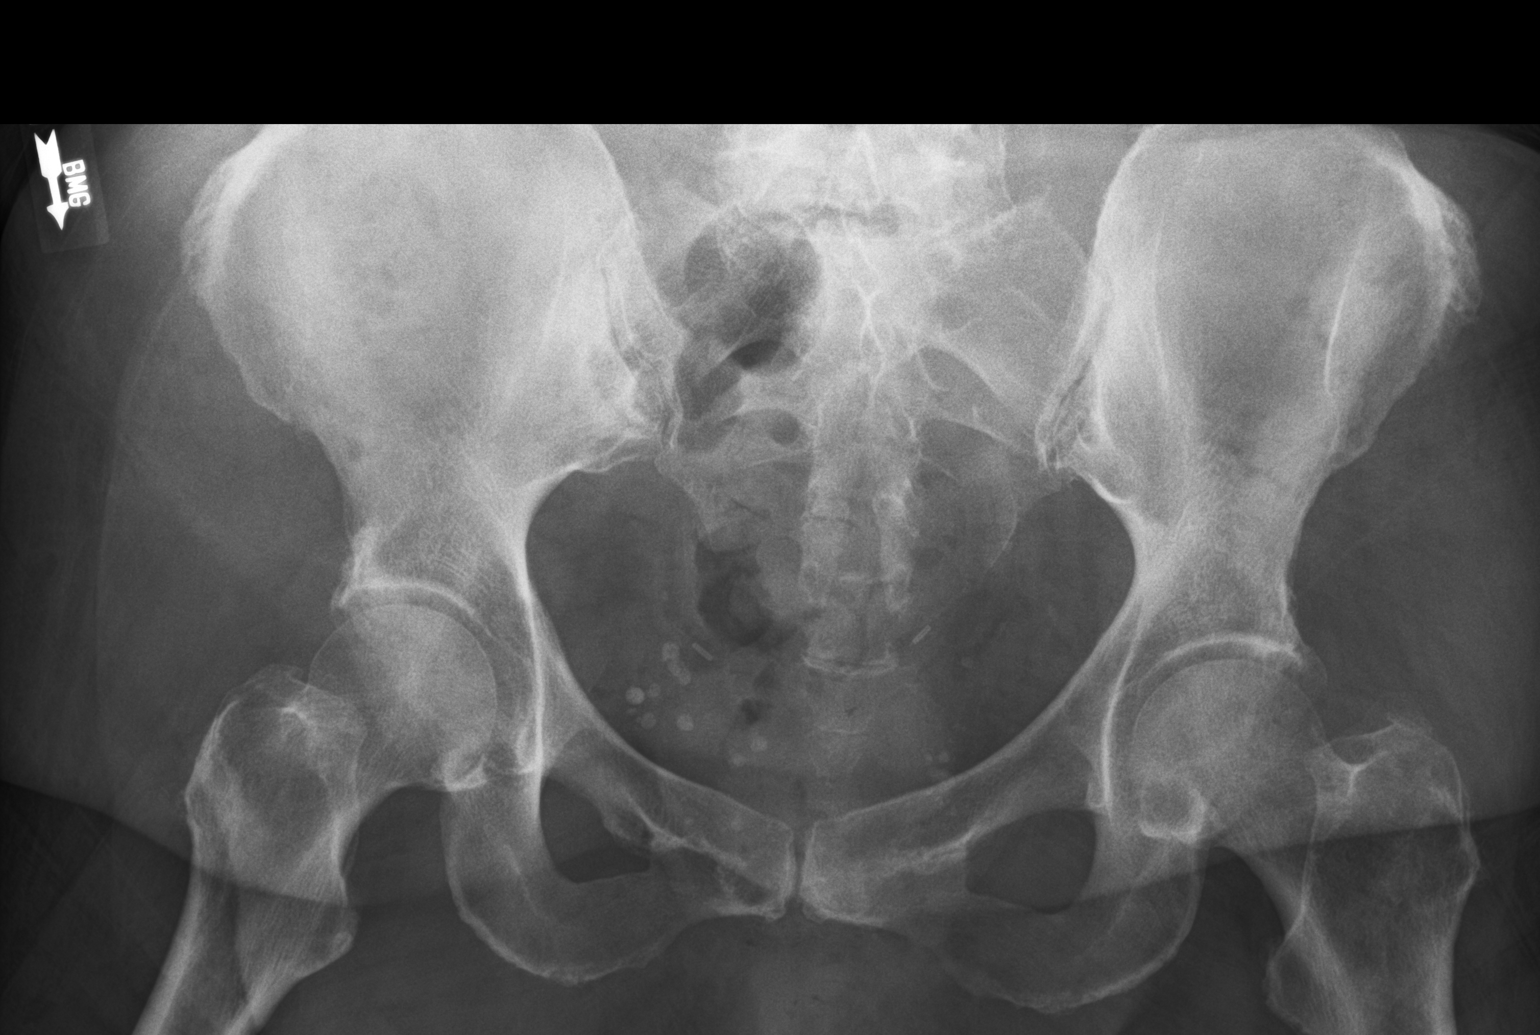

[2 of 2 positions shown; findings below may reference images not displayed]

FINDINGS: Cholecystectomy clips and a spinal stimulator are noted. There is a
moderate amount of stool in the predominantly proximal colon. No
dilated bowel loops are seen to suggest obstruction. Examination is
mildly limited by patient body habitus, with both flanks being
incompletely imaged. No intraperitoneal free air is identified,
though evaluation is limited on this supine study. Thoracolumbar
disc degeneration pelvic phleboliths are noted.
IMPRESSION: Nonobstructed bowel gas pattern.

## 2018-05-23 DIAGNOSIS — M25512 Pain in left shoulder: Secondary | ICD-10-CM | POA: Diagnosis not present

## 2018-05-23 DIAGNOSIS — G894 Chronic pain syndrome: Secondary | ICD-10-CM | POA: Diagnosis not present

## 2018-05-23 DIAGNOSIS — M79606 Pain in leg, unspecified: Secondary | ICD-10-CM | POA: Diagnosis not present

## 2018-05-23 DIAGNOSIS — M545 Low back pain: Secondary | ICD-10-CM | POA: Diagnosis not present

## 2018-05-23 DIAGNOSIS — Z79891 Long term (current) use of opiate analgesic: Secondary | ICD-10-CM | POA: Diagnosis not present

## 2018-05-29 ENCOUNTER — Other Ambulatory Visit: Payer: Self-pay | Admitting: Family Medicine

## 2018-05-29 DIAGNOSIS — G459 Transient cerebral ischemic attack, unspecified: Secondary | ICD-10-CM

## 2018-06-10 ENCOUNTER — Other Ambulatory Visit: Payer: Self-pay | Admitting: Family Medicine

## 2018-06-28 ENCOUNTER — Other Ambulatory Visit: Payer: Self-pay | Admitting: Family Medicine

## 2018-06-28 DIAGNOSIS — G459 Transient cerebral ischemic attack, unspecified: Secondary | ICD-10-CM

## 2018-06-28 NOTE — Telephone Encounter (Signed)
Requested medication (s) are due for refill today:  Yes   Had 30 courtesy refill  Requested medication (s) are on the active medication list:   Yes  Future visit scheduled:   No    Needs scheduled for virtual visit.    Noticed she has cancelled her last several appts   Last ordered: 05/29/2018  #30  0 Refills Forwarded to Dr. Nolon Rod for further disposition   Requested Prescriptions  Pending Prescriptions Disp Refills   clopidogrel (PLAVIX) 75 MG tablet [Pharmacy Med Name: CLOPIDOGREL 75MG TABLETS] 90 tablet     Sig: TAKE 1 TABLET BY [place]     Hematology: Antiplatelets - clopidogrel Failed - 06/28/2018  7:06 AM      Failed - Evaluate AST, ALT within 2 months of therapy initiation.      Failed - HCT in normal range and within 180 days    HCT  Date Value Ref Range Status  05/15/2017 34.2 (L) 36.0 - 46.0 % Final   Hematocrit  Date Value Ref Range Status  03/24/2017 35.5 34.0 - 46.6 % Final         Failed - HGB in normal range and within 180 days    Hemoglobin  Date Value Ref Range Status  05/15/2017 10.8 (L) 12.0 - 15.0 g/dL Final  03/24/2017 11.6 11.1 - 15.9 g/dL Final         Failed - PLT in normal range and within 180 days    Platelets  Date Value Ref Range Status  05/15/2017 206 150 - 400 K/uL Final  03/24/2017 162 150 - 379 x10E3/uL Final   Platelet Count, POC  Date Value Ref Range Status  03/26/2017 234 142 - 424 K/uL Final         Failed - Valid encounter within last 6 months    Recent Outpatient Visits          6 months ago Iatrogenic hypotension   Primary Care at [facility], Zoe A, MD   7 months ago 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 ([place])   Primary Care at [facility], Zoe A, MD   8 months ago Postural dizziness with presyncope   Primary Care at [facility] [place] A, MD   9 months ago Encounter for PPD skin test reading   Primary Care at [facility], Zoe A, MD   9 months ago Frequent falls   Primary Care at  [facility], Arlie Solomons, MD             Passed - ALT in normal range and within 360 days    ALT  Date Value Ref Range Status  11/04/2017 22 0 - 32 IU/L Final         Passed - AST in normal range and within 360 days    AST  Date Value Ref Range Status  11/04/2017 21 0 - 40 IU/L Final

## 2018-06-30 ENCOUNTER — Other Ambulatory Visit: Payer: Self-pay | Admitting: Cardiology

## 2018-06-30 ENCOUNTER — Telehealth: Payer: Self-pay | Admitting: Cardiology

## 2018-06-30 MED ORDER — RANOLAZINE ER 1000 MG PO TB12: 500.0000 mg | ORAL_TABLET | Freq: Two times a day (BID) | ORAL | 0 refills | Status: DC

## 2018-06-30 NOTE — Telephone Encounter (Incomplete)
°*  STAT* If patient is at the pharmacy, call can be transferred to refill team.   1. Which medications need to be refilled? (please list name of each medication and dose if known) *****  2. Which pharmacy/location (including street and city if local pharmacy) is medication to be sent to?****  3. Do they need a 30 day or 90 day supply? ***

## 2018-06-30 NOTE — Telephone Encounter (Signed)
Rx sent to pharmacy for Ranexa

## 2018-06-30 NOTE — Telephone Encounter (Signed)
°*  STAT* If patient is at the pharmacy, call can be transferred to refill team.   1. Which medications need to be refilled? (please list name of each medication and dose if known) ranolazine (RANEXA) 1000 MG SR tablet   2. Which pharmacy/location (including street and city if local pharmacy) is medication to be sent to?  [facility] [place], [place] [phone] (Phone) [phone] (Fax)    3. Do they need a 30 day or 90 day supply? 90 day

## 2018-07-01 ENCOUNTER — Other Ambulatory Visit: Payer: Self-pay | Admitting: Cardiology

## 2018-07-08 ENCOUNTER — Telehealth: Payer: Self-pay | Admitting: Endocrinology

## 2018-07-08 NOTE — Telephone Encounter (Signed)
Returned pt call as requested. No answer.

## 2018-07-08 NOTE — Telephone Encounter (Signed)
Patient stated she would like a referral sent to [facility] in [place] for an endocrinologist due to traveling to [place] has become difficult. Request call with details.  Please Advise, Thanks

## 2018-09-22 ENCOUNTER — Telehealth: Payer: Self-pay | Admitting: Family Medicine

## 2018-09-22 ENCOUNTER — Other Ambulatory Visit: Payer: Self-pay | Admitting: Cardiology

## 2018-09-22 NOTE — Telephone Encounter (Signed)
Pharmacy called and is needing a script sent in for pts 12.7  inuslin needles for pts different insulin pens. Please advise.   [facility] [place], [place]  [address] [place] [address]  Phone: [phone] Fax: [phone]  Not a 24 hour pharmacy; exact hours not known.

## 2018-09-23 NOTE — Telephone Encounter (Signed)
Patient has not been seen by cardiology since 04/2017

## 2018-09-26 ENCOUNTER — Other Ambulatory Visit: Payer: Self-pay | Admitting: Cardiology

## 2018-09-26 ENCOUNTER — Telehealth: Payer: Self-pay

## 2018-09-26 MED ORDER — RANOLAZINE ER 1000 MG PO TB12: 1000.0000 mg | ORAL_TABLET | Freq: Two times a day (BID) | ORAL | 0 refills | Status: DC

## 2018-09-26 NOTE — Telephone Encounter (Signed)
Refill request in the wrong chart. Will send the refill in the correct chart. Both Charts have been marked for merge

## 2018-09-26 NOTE — Telephone Encounter (Signed)
*  STAT* If patient is at the pharmacy, call can be transferred to refill team.   1. Which medications need to be refilled? (please list name of each medication and dose if known) Ranolazine 1061m tablets  2. Which pharmacy/location (including street and city if local pharmacy) is medication to be sent to?[facility] on [address] [place]  3. Do they need a 30 day or 90 day supply? [place]

## 2018-09-26 NOTE — Telephone Encounter (Signed)
°*  STAT* If patient is at the pharmacy, call can be transferred to refill team.   1. Which medications need to be refilled? (please list name of each medication and dose if known) Ranolazine 1058m tablets  2. Which pharmacy/location (including street and city if local pharmacy) is medication to be sent to?[facility] on [address] [place]  3. Do they need a 30 day or 90 day supply? [place]

## 2018-09-26 NOTE — Telephone Encounter (Signed)
Refills sent in. Patient needs to be seen before more refills will be sent in. Last seen 04/2017.

## 2018-09-28 IMAGING — DX DG KNEE COMPLETE 4+V*L*
3 series · 3 of 3 positions shown · non-contrast
Comparison: Plain film of the LEFT knee dated 04/23/2017.

CLINICAL DATA: LEFT knee pain after fall.

EXAM:
LEFT KNEE - COMPLETE 4+ VIEW

[knee ap]
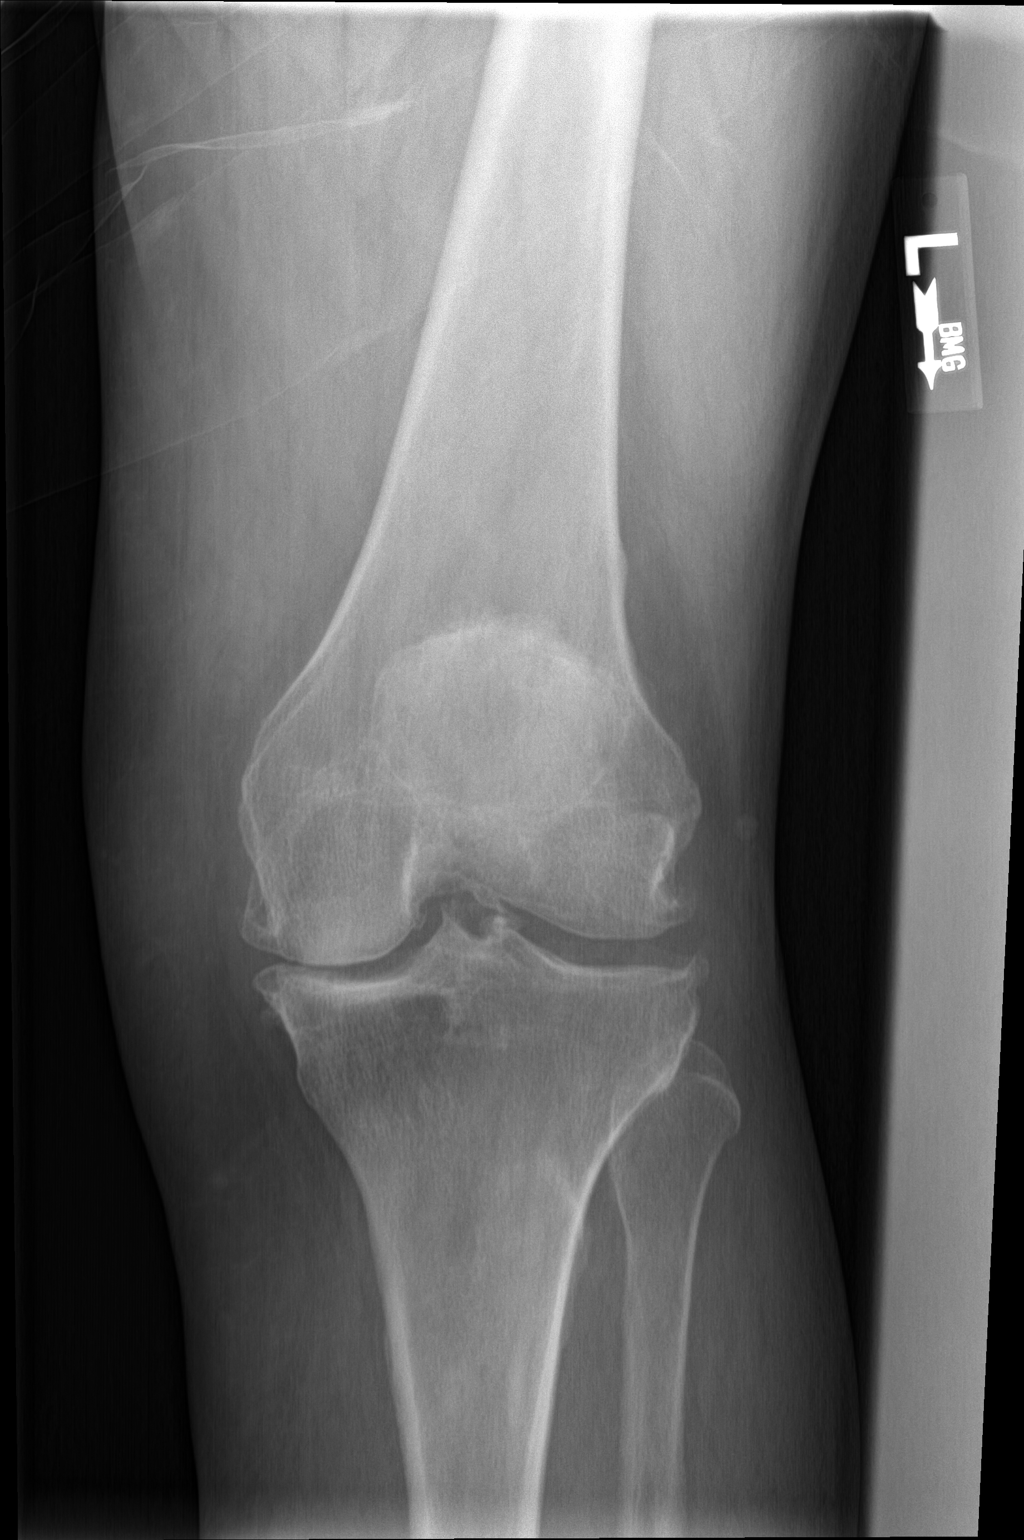

[knee lat]
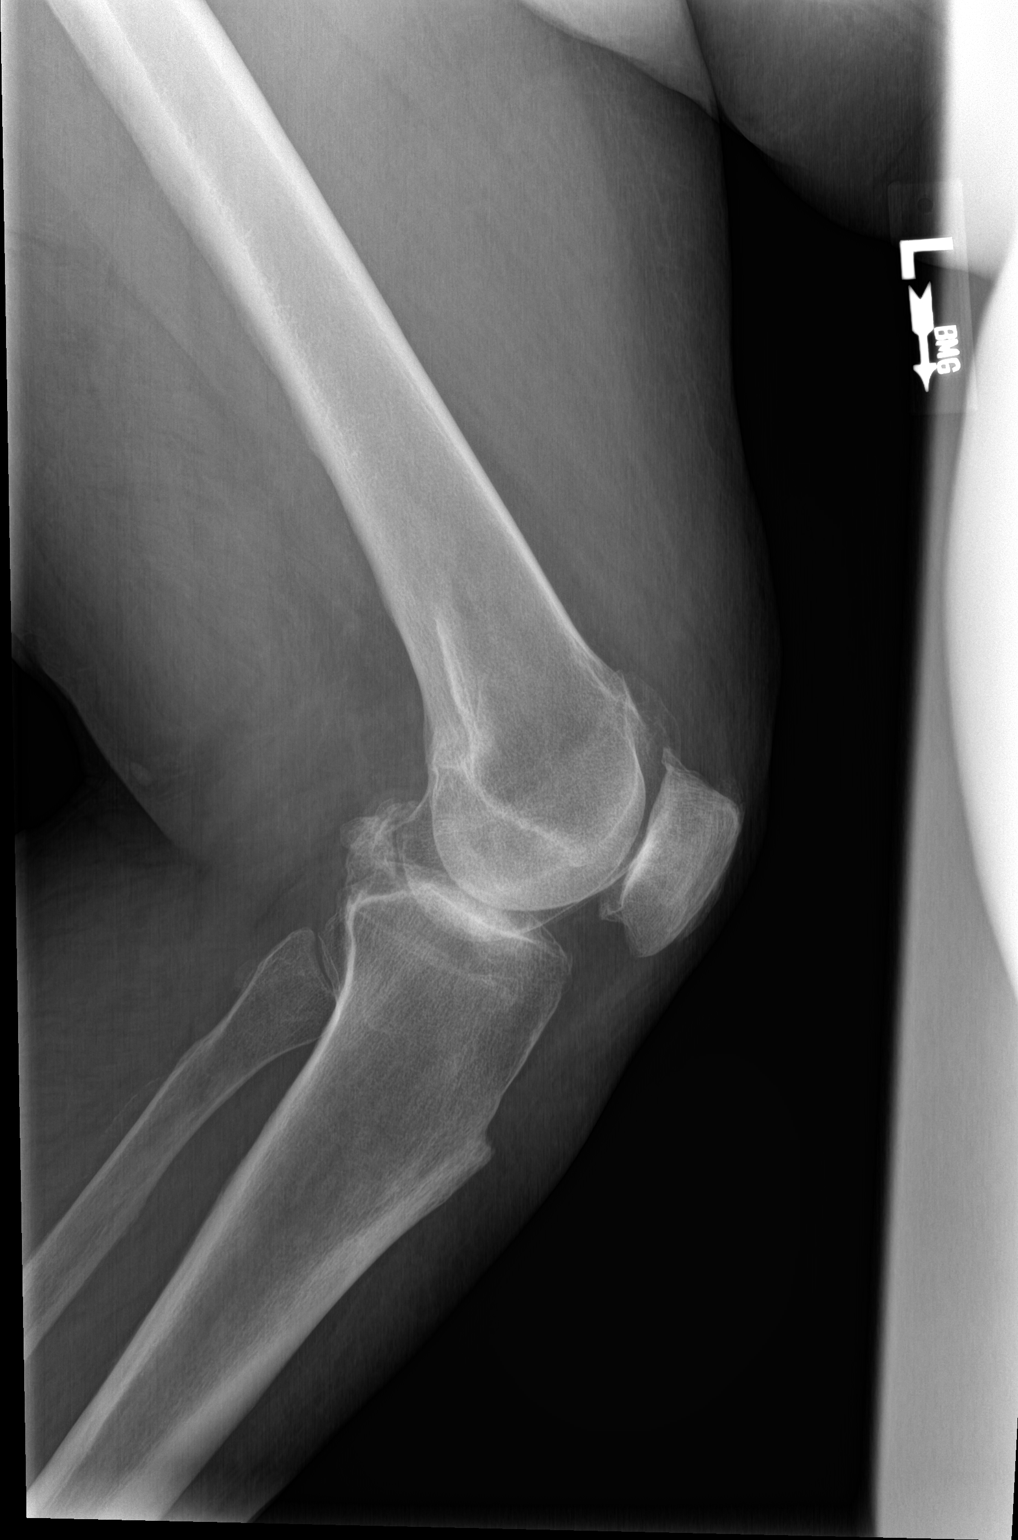

[knee [person_name]]
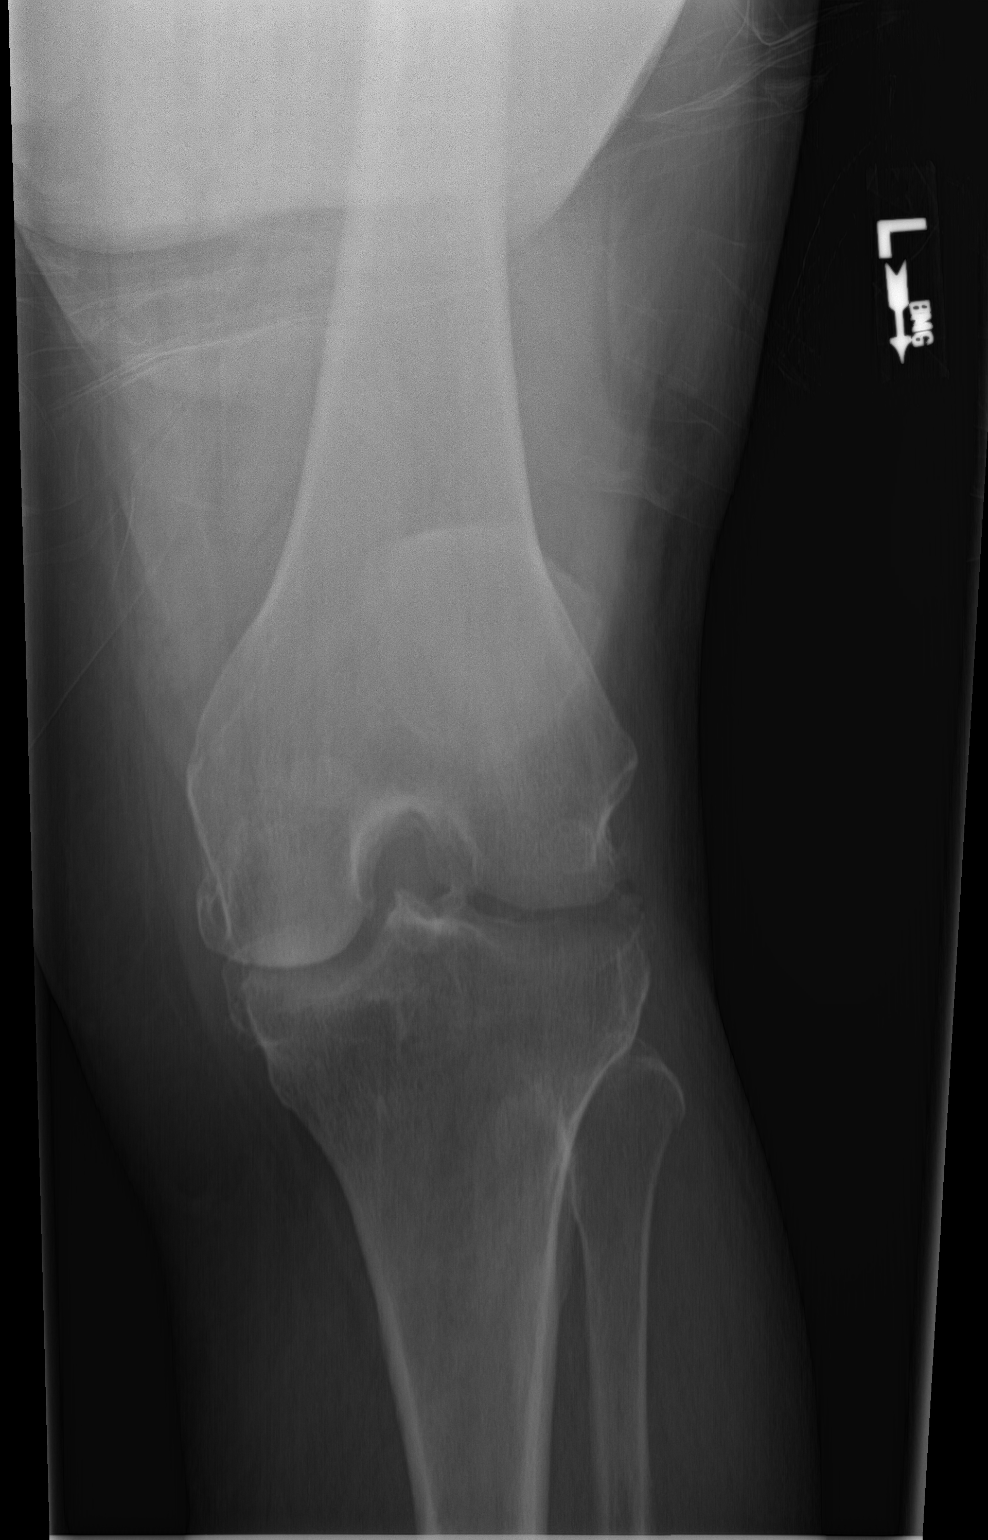

[3 of 3 positions shown; findings below may reference images not displayed]

FINDINGS: Tricompartmental degenerative changes, mild to moderate in degree,
again most significantly involving the medial and patellofemoral
compartments with associated joint space narrowings and osseous
spurring.

Osseous alignment is stable. No fracture line or displaced fracture
fragment seen. No acute or suspicious osseous lesion. No appreciable
joint effusion and adjacent soft tissues are unremarkable.
IMPRESSION: 1. No acute findings.  No osseous fracture or dislocation.
2. Stable tricompartmental DJD, moderate in degree, again most
significant at the medial and patellofemoral compartments.

## 2018-12-21 ENCOUNTER — Other Ambulatory Visit: Payer: Self-pay | Admitting: Cardiology

## 2018-12-21 NOTE — Telephone Encounter (Signed)
Rx refill sent to pharmacy. 

## 2019-01-20 ENCOUNTER — Other Ambulatory Visit: Payer: Self-pay | Admitting: Cardiology

## 2019-09-18 ENCOUNTER — Other Ambulatory Visit: Payer: Medicare Other

## 2019-10-04 ENCOUNTER — Other Ambulatory Visit: Payer: Medicare Other

## 2019-10-18 ENCOUNTER — Other Ambulatory Visit: Payer: Medicare Other
